# Patient Record
Sex: Female | Born: 1985 | Race: White | Hispanic: No | Marital: Married | Smoking: Former smoker
Health system: Southern US, Community
[De-identification: ages and names within clinical notes are randomized; demographics above are authoritative.]

## PROBLEM LIST (undated history)

## (undated) ENCOUNTER — Inpatient Hospital Stay (HOSPITAL_COMMUNITY): Payer: 59

## (undated) ENCOUNTER — Inpatient Hospital Stay (HOSPITAL_COMMUNITY): Payer: Self-pay

## (undated) DIAGNOSIS — F329 Major depressive disorder, single episode, unspecified: Secondary | ICD-10-CM

## (undated) DIAGNOSIS — M069 Rheumatoid arthritis, unspecified: Secondary | ICD-10-CM

## (undated) DIAGNOSIS — B977 Papillomavirus as the cause of diseases classified elsewhere: Secondary | ICD-10-CM

## (undated) DIAGNOSIS — Z3490 Encounter for supervision of normal pregnancy, unspecified, unspecified trimester: Secondary | ICD-10-CM

## (undated) DIAGNOSIS — F32A Depression, unspecified: Secondary | ICD-10-CM

## (undated) DIAGNOSIS — F419 Anxiety disorder, unspecified: Secondary | ICD-10-CM

## (undated) DIAGNOSIS — R87619 Unspecified abnormal cytological findings in specimens from cervix uteri: Secondary | ICD-10-CM

## (undated) DIAGNOSIS — Z01419 Encounter for gynecological examination (general) (routine) without abnormal findings: Secondary | ICD-10-CM

## (undated) DIAGNOSIS — Q71819 Congenital shortening of unspecified upper limb: Secondary | ICD-10-CM

## (undated) DIAGNOSIS — K219 Gastro-esophageal reflux disease without esophagitis: Secondary | ICD-10-CM

## (undated) DIAGNOSIS — Q320 Congenital tracheomalacia: Secondary | ICD-10-CM

## (undated) HISTORY — PX: BACK SURGERY: SHX140

## (undated) HISTORY — DX: Encounter for gynecological examination (general) (routine) without abnormal findings: Z01.419

## (undated) HISTORY — DX: Papillomavirus as the cause of diseases classified elsewhere: B97.7

## (undated) HISTORY — DX: Encounter for supervision of normal pregnancy, unspecified, unspecified trimester: Z34.90

## (undated) HISTORY — DX: Congenital tracheomalacia: Q32.0

## (undated) HISTORY — DX: Anxiety disorder, unspecified: F41.9

## (undated) HISTORY — PX: SHOULDER ARTHROSCOPY: SHX128

## (undated) HISTORY — DX: Major depressive disorder, single episode, unspecified: F32.9

## (undated) HISTORY — DX: Gastro-esophageal reflux disease without esophagitis: K21.9

## (undated) HISTORY — DX: Unspecified abnormal cytological findings in specimens from cervix uteri: R87.619

## (undated) HISTORY — DX: Congenital shortening of unspecified upper limb: Q71.819

## (undated) HISTORY — DX: Depression, unspecified: F32.A

---
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
## 2005-01-25 ENCOUNTER — Ambulatory Visit (HOSPITAL_COMMUNITY): Payer: Self-pay | Admitting: Psychiatry

## 2005-02-25 ENCOUNTER — Emergency Department (HOSPITAL_COMMUNITY): Admission: EM | Admit: 2005-02-25 | Discharge: 2005-02-25 | Payer: Self-pay | Admitting: Emergency Medicine

## 2005-03-28 ENCOUNTER — Ambulatory Visit (HOSPITAL_COMMUNITY): Payer: Self-pay | Admitting: Psychiatry

## 2005-03-28 ENCOUNTER — Ambulatory Visit: Payer: Self-pay | Admitting: Psychiatry

## 2005-07-13 ENCOUNTER — Ambulatory Visit (HOSPITAL_COMMUNITY): Payer: Self-pay | Admitting: Psychiatry

## 2005-09-01 ENCOUNTER — Other Ambulatory Visit: Admission: RE | Admit: 2005-09-01 | Discharge: 2005-09-01 | Payer: Self-pay | Admitting: Family Medicine

## 2005-10-11 ENCOUNTER — Ambulatory Visit (HOSPITAL_BASED_OUTPATIENT_CLINIC_OR_DEPARTMENT_OTHER): Admission: RE | Admit: 2005-10-11 | Discharge: 2005-10-11 | Payer: Self-pay | Admitting: Orthopaedic Surgery

## 2005-10-12 ENCOUNTER — Ambulatory Visit (HOSPITAL_COMMUNITY): Payer: Self-pay | Admitting: Psychiatry

## 2006-04-25 HISTORY — PX: LUMBAR FUSION: SHX111

## 2006-08-23 ENCOUNTER — Encounter: Admission: RE | Admit: 2006-08-23 | Discharge: 2006-08-23 | Payer: Self-pay | Admitting: Neurosurgery

## 2006-09-07 ENCOUNTER — Inpatient Hospital Stay (HOSPITAL_COMMUNITY): Admission: RE | Admit: 2006-09-07 | Discharge: 2006-09-11 | Payer: Self-pay | Admitting: Neurosurgery

## 2006-10-25 ENCOUNTER — Other Ambulatory Visit: Admission: RE | Admit: 2006-10-25 | Discharge: 2006-10-25 | Payer: Self-pay | Admitting: Family Medicine

## 2006-11-29 ENCOUNTER — Ambulatory Visit (HOSPITAL_COMMUNITY): Payer: Self-pay | Admitting: Psychiatry

## 2007-01-03 ENCOUNTER — Ambulatory Visit (HOSPITAL_COMMUNITY): Payer: Self-pay | Admitting: Psychiatry

## 2007-04-04 ENCOUNTER — Ambulatory Visit (HOSPITAL_COMMUNITY): Payer: Self-pay | Admitting: Psychiatry

## 2007-07-16 ENCOUNTER — Ambulatory Visit (HOSPITAL_COMMUNITY): Payer: Self-pay | Admitting: Psychiatry

## 2007-11-21 ENCOUNTER — Other Ambulatory Visit: Admission: RE | Admit: 2007-11-21 | Discharge: 2007-11-21 | Payer: Self-pay | Admitting: Family Medicine

## 2008-03-01 ENCOUNTER — Emergency Department (HOSPITAL_BASED_OUTPATIENT_CLINIC_OR_DEPARTMENT_OTHER): Admission: EM | Admit: 2008-03-01 | Discharge: 2008-03-01 | Payer: Self-pay | Admitting: Emergency Medicine

## 2008-04-16 ENCOUNTER — Ambulatory Visit: Payer: Self-pay | Admitting: Diagnostic Radiology

## 2008-04-16 ENCOUNTER — Emergency Department (HOSPITAL_BASED_OUTPATIENT_CLINIC_OR_DEPARTMENT_OTHER): Admission: EM | Admit: 2008-04-16 | Discharge: 2008-04-16 | Payer: Self-pay | Admitting: Emergency Medicine

## 2008-07-01 ENCOUNTER — Ambulatory Visit (HOSPITAL_COMMUNITY): Admission: RE | Admit: 2008-07-01 | Discharge: 2008-07-01 | Payer: Self-pay | Admitting: Family Medicine

## 2008-11-26 ENCOUNTER — Other Ambulatory Visit: Admission: RE | Admit: 2008-11-26 | Discharge: 2008-11-26 | Payer: Self-pay | Admitting: Family Medicine

## 2010-01-26 ENCOUNTER — Encounter: Admission: RE | Admit: 2010-01-26 | Discharge: 2010-01-26 | Payer: Self-pay | Admitting: Obstetrics and Gynecology

## 2010-06-22 ENCOUNTER — Ambulatory Visit (HOSPITAL_COMMUNITY): Payer: Commercial Managed Care - PPO | Admitting: Physician Assistant

## 2010-06-22 DIAGNOSIS — F411 Generalized anxiety disorder: Secondary | ICD-10-CM

## 2010-07-06 ENCOUNTER — Ambulatory Visit (HOSPITAL_COMMUNITY): Payer: Commercial Managed Care - PPO | Admitting: Marriage and Family Therapist

## 2010-07-12 ENCOUNTER — Ambulatory Visit (HOSPITAL_COMMUNITY): Payer: Commercial Managed Care - PPO | Admitting: Marriage and Family Therapist

## 2010-07-12 DIAGNOSIS — F411 Generalized anxiety disorder: Secondary | ICD-10-CM

## 2010-07-19 ENCOUNTER — Encounter (HOSPITAL_COMMUNITY): Payer: Commercial Managed Care - PPO | Admitting: Marriage and Family Therapist

## 2010-07-19 DIAGNOSIS — F988 Other specified behavioral and emotional disorders with onset usually occurring in childhood and adolescence: Secondary | ICD-10-CM

## 2010-07-19 DIAGNOSIS — F411 Generalized anxiety disorder: Secondary | ICD-10-CM

## 2010-07-27 ENCOUNTER — Encounter (HOSPITAL_COMMUNITY): Payer: Commercial Managed Care - PPO | Admitting: Physician Assistant

## 2010-07-27 DIAGNOSIS — F988 Other specified behavioral and emotional disorders with onset usually occurring in childhood and adolescence: Secondary | ICD-10-CM

## 2010-07-27 DIAGNOSIS — F411 Generalized anxiety disorder: Secondary | ICD-10-CM

## 2010-07-28 ENCOUNTER — Encounter (HOSPITAL_BASED_OUTPATIENT_CLINIC_OR_DEPARTMENT_OTHER): Payer: Commercial Managed Care - PPO | Admitting: Marriage and Family Therapist

## 2010-07-28 DIAGNOSIS — F988 Other specified behavioral and emotional disorders with onset usually occurring in childhood and adolescence: Secondary | ICD-10-CM

## 2010-07-28 DIAGNOSIS — F411 Generalized anxiety disorder: Secondary | ICD-10-CM

## 2010-08-03 ENCOUNTER — Encounter (HOSPITAL_COMMUNITY): Payer: Commercial Managed Care - PPO | Admitting: Marriage and Family Therapist

## 2010-08-03 DIAGNOSIS — F411 Generalized anxiety disorder: Secondary | ICD-10-CM

## 2010-08-03 DIAGNOSIS — F988 Other specified behavioral and emotional disorders with onset usually occurring in childhood and adolescence: Secondary | ICD-10-CM

## 2010-08-09 ENCOUNTER — Encounter (HOSPITAL_COMMUNITY): Payer: Commercial Managed Care - PPO | Admitting: Marriage and Family Therapist

## 2010-08-09 DIAGNOSIS — F988 Other specified behavioral and emotional disorders with onset usually occurring in childhood and adolescence: Secondary | ICD-10-CM

## 2010-08-09 DIAGNOSIS — F411 Generalized anxiety disorder: Secondary | ICD-10-CM

## 2010-08-16 ENCOUNTER — Encounter (HOSPITAL_COMMUNITY): Payer: Commercial Managed Care - PPO | Admitting: Marriage and Family Therapist

## 2010-08-16 DIAGNOSIS — F988 Other specified behavioral and emotional disorders with onset usually occurring in childhood and adolescence: Secondary | ICD-10-CM

## 2010-08-16 DIAGNOSIS — F411 Generalized anxiety disorder: Secondary | ICD-10-CM

## 2010-08-26 ENCOUNTER — Encounter (HOSPITAL_COMMUNITY): Payer: Commercial Managed Care - PPO | Admitting: Marriage and Family Therapist

## 2010-08-26 DIAGNOSIS — F410 Panic disorder [episodic paroxysmal anxiety] without agoraphobia: Secondary | ICD-10-CM

## 2010-08-26 DIAGNOSIS — F988 Other specified behavioral and emotional disorders with onset usually occurring in childhood and adolescence: Secondary | ICD-10-CM

## 2010-09-03 ENCOUNTER — Encounter (HOSPITAL_COMMUNITY): Payer: Commercial Managed Care - PPO | Admitting: Physician Assistant

## 2010-09-04 ENCOUNTER — Inpatient Hospital Stay (INDEPENDENT_AMBULATORY_CARE_PROVIDER_SITE_OTHER)
Admission: RE | Admit: 2010-09-04 | Discharge: 2010-09-04 | Disposition: A | Discharge status: Home / Self Care | Payer: Commercial Managed Care - PPO | Source: Ambulatory Visit | Attending: Emergency Medicine | Admitting: Emergency Medicine

## 2010-09-04 ENCOUNTER — Encounter: Payer: Self-pay | Admitting: Emergency Medicine

## 2010-09-04 DIAGNOSIS — F411 Generalized anxiety disorder: Secondary | ICD-10-CM

## 2010-09-04 DIAGNOSIS — J069 Acute upper respiratory infection, unspecified: Secondary | ICD-10-CM

## 2010-09-04 HISTORY — DX: Generalized anxiety disorder: F41.1

## 2010-09-06 ENCOUNTER — Telehealth (INDEPENDENT_AMBULATORY_CARE_PROVIDER_SITE_OTHER): Payer: Self-pay | Admitting: *Deleted

## 2010-09-10 NOTE — Op Note (Signed)
NAMECALVINA, [name] NO.:  [id number]   MEDICAL RECORD NO.:  [id number]          PATIENT TYPE:  INP   LOCATION:  2899                         FACILITY:  [facility]   PHYSICIAN:  Reinaldo Meeker, M.D. DATE OF BIRTH:  [birth year]   DATE OF PROCEDURE:  09/07/2006  DATE OF DISCHARGE:                               OPERATIVE REPORT   PREOPERATIVE DIAGNOSIS:  Spondylolysis L5 with spondylolisthesis L5-S1.   POSTOPERATIVE DIAGNOSIS:  Spondylolysis L5 with spondylolisthesis L5-S1.   PROCEDURE:  L5-S1 GIL procedure followed by L5-S1 posterior lumbar  interbody fusion with Nuvasive bony spacer and PEEK interbody cage  followed by nonsegmental pedicle screw instrumentation L5-S1, followed  by L5-S1 posterolateral fusion.   SURGEON:  Dr. Gerlene Fee.   ASSISTANT:  Donalee Citrin, M.D.   PROCEDURE IN DETAIL:  After placed in the prone position the patient's  back was prepped and draped in the usual sterile fashion.  Localizing  fluoroscopy was used prior to incision to identify the appropriate  level.  Midline incision was made above the spinous processes of L5-S1.  Using Bovie cutting current incision was carried down to the spinous  processes.  Subperiosteal dissection was then carried out bilaterally on  the spinous processes, lamina, and facet joint and the far lateral  region to identify the transverse processes of L5.  Self retaining  retractor was placed for exposure and x-ray showed approach to the  appropriate level.  The spinous processes of L5-S1 were then removed.  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  Free floating lamina of L5 was then removed  and then the residual free bone above the pars defects were removed as  well.  Residual bone ligamentum flavum removed in a piecemeal fashion  and the L5-S1 nerve roots were both identified with more decompression  performed than was needed for the posterior body  fusion.  At this time  bilateral microdiskectomy was carried out at L5-S1 with thorough  cleaning of the disk space.  The disk space was then distracted up to 10  mm size.  Was then prepared for posterior body fusion by using rotating  cutter and then a box cutter.  A bony spacer was placed on one side and  a PEEK body cage filled with autologous bone and DBX puddy was placed on  the other.  Prior to placing the second spacer, autologous bone DBX  puddy and BMP were placed in the midline.  At this time pedicle screws  fixation was carried out.  Starting on the patient's left side at L5  drill hole was placed followed by passing the probe, tapping with a 5.0  tap and placing a 5.5 x 40-mm screw in this was followed into good  position by fluoroscopy.  At S1 similar procedure was carried out except  this time a 35 mm x 6.5 screw was carried out and 6.0 tap was used.  Similar procedure was then carried on the opposite side once again using  the same size screws had been used on the opposite side with a 5.5  screw  being used to L5 and a 6.5 screw being used at S1.  Fluoroscopy showed  the screws to be in good position.  High-speed drill was used to  decorticate the transverse process of L5, far lateral aspect of the  sacrum.  Prior to placing the rod autologous bone and BMP were placed  for posterolateral fusion bilaterally.  Large amounts irrigation were  then carried out.  Any bleeding controlled bipolar coagulation and  Gelfoam.  The appropriate length rod was then chosen.  It was then  secured on top and then sequential tightening was carried out.  Prior to  locking the screws in L5, compression was carried out bilaterally.  Final fluoroscopy in AP and lateral direction showed the screws, rods  and interbody spacers all be in good position.  A drain was then brought  through a separate stab wound incision left in the epidural space.  The  wound was then closed in multiple layers of Vicryl  in the muscle,  fascia, subcutaneous, and subcuticular tissues and staples were placed  on the skin.  A sterile dressing was then applied and the patient was  extubated and taken to the recovery room in stable condition.           ______________________________  Reinaldo Meeker, M.D.     ROK/MEDQ  D:  09/07/2006  T:  09/08/2006  Job:  [id number]

## 2010-09-10 NOTE — Discharge Summary (Signed)
[name], STOFFEL NO.:  [id number]   MEDICAL RECORD NO.:  [id number]          PATIENT TYPE:  INP   LOCATION:  3010                         FACILITY:  [facility]   PHYSICIAN:  Reinaldo Meeker, M.D. DATE OF BIRTH:  1986/04/25   DATE OF ADMISSION:  09/07/2006  DATE OF DISCHARGE:  09/11/2006                               DISCHARGE SUMMARY   PREOPERATIVE DIAGNOSIS:  Spondylolysis with spondylolisthesis L5-S1.   PRIMARY OPERATIVE PROCEDURE:  L5-S1 decompressive laminectomy with post  thromboembolic infusion and pedicle screw fixation.   FINAL DIAGNOSIS:  L5-S1 decompressive laminectomy with post  thromboembolic infusion and pedicle screw fixation.   HISTORY:  Ms. [name] is a 25 year old female with back and bilateral  lower extremity pain.  Her MRI scan shows spondylolysis with  spondylolisthesis at L5-S1.  She is now admitted for an L5-S1  decompressive diskectomy with pedicle screw fixation.  On May 15th, she  was taken to the operating room where she underwent the operative  procedure and did well.  She was somewhat slow to increase her activity  and had a rather large amount of severe pain that required a lot of  medical manipulation.  This slowed up her recovery, but she was  eventually able to be discharged on May 19 which was her 4th day  postoperatively.  At that time, she was up, ambulating well, and  tolerating a regular diet.  The wound was healing well.   DISCHARGE MEDICATIONS:  Pain medications and muscle relaxants.   DISCHARGE CONDITION:  Markedly improved versus admission.           ______________________________  Reinaldo Meeker, M.D.     ROK/MEDQ  D:  10/05/2006  T:  10/05/2006  Job:  [id number]

## 2010-09-10 NOTE — Op Note (Signed)
[name], [name]            ACCOUNT NO.:  [id number]   MEDICAL RECORD NO.:  [id number]          PATIENT TYPE:  AMB   LOCATION:  NESC                         FACILITY:  [facility]   PHYSICIAN:  Sharolyn Douglas, M.D.        DATE OF BIRTH:  1985/12/18   DATE OF PROCEDURE:  10/11/2005  DATE OF DISCHARGE:                                 OPERATIVE REPORT   PREOPERATIVE DIAGNOSIS:  1.  Isthmic spondylolisthesis L5-S1.  2.  Right L5 radiculitis.   POSTOPERATIVE DIAGNOSIS:  1.  Isthmic spondylolisthesis L5-S1.  2.  Right L5 radiculitis.   PROCEDURE:  1.  Right L5 transforaminal epidural steroid injection.  2.  Fluoroscopic imaging used for needle placement during epidural steroid      injection.   SURGEON:  Sharolyn Douglas, M.D.   ASSISTANT:  None.   ANESTHESIA:  MAC plus local.   COMPLICATIONS:  None.   ESTIMATED BLOOD LOSS:  None.   INDICATIONS:  The patient is a pleasant 25 year old girl with persistent  back and right leg pain related to isthmic spondylolisthesis and L5 nerve  root irritation.  She has failed to respond to other treatments and now  elects to undergo right L5-S1 transforaminal steroid injection in hopes of  improving her symptoms.  The risks and benefits were reviewed.   PROCEDURE:  The patient was identified in the holding area and taken to the  operating room.  She underwent sedation by anesthesia without difficulty and  was turned prone.  All bony prominences were padded.  The back was prepped  and draped in the usual sterile fashion.  Fluoroscopy was brought into the  field and oblique imaging of the L5-S1 level was obtained.  A 22 gauge  Quincke spinal needle was placed in the 6 o'clock position below the L5  pedicle.  1 mL of Omnipaque injected and there was good epidural spread.  We  then injected a solution of 80 mg  Depo-Medrol and 4 mL of 1% preservative free lidocaine.  The spinal needle  was removed.  A Band-Aid was placed.  The patient was transferred  to  recovery in stable condition.  She was neurologically intact post injection.  Instructions were reviewed.  She will follow-up in two weeks.      Sharolyn Douglas, M.D.  Electronically Signed     MC/MEDQ  D:  10/11/2005  T:  10/11/2005  Job:  [id number]

## 2010-09-21 ENCOUNTER — Encounter (HOSPITAL_COMMUNITY): Payer: Commercial Managed Care - PPO | Admitting: Physician Assistant

## 2010-09-21 DIAGNOSIS — F411 Generalized anxiety disorder: Secondary | ICD-10-CM

## 2010-09-27 ENCOUNTER — Encounter (HOSPITAL_COMMUNITY): Payer: Commercial Managed Care - PPO | Admitting: Marriage and Family Therapist

## 2010-09-27 DIAGNOSIS — F988 Other specified behavioral and emotional disorders with onset usually occurring in childhood and adolescence: Secondary | ICD-10-CM

## 2010-09-27 DIAGNOSIS — F411 Generalized anxiety disorder: Secondary | ICD-10-CM

## 2010-10-11 ENCOUNTER — Other Ambulatory Visit: Payer: Self-pay | Admitting: Obstetrics and Gynecology

## 2010-10-11 DIAGNOSIS — N63 Unspecified lump in unspecified breast: Secondary | ICD-10-CM

## 2010-10-18 ENCOUNTER — Encounter (HOSPITAL_COMMUNITY): Payer: Commercial Managed Care - PPO | Admitting: Marriage and Family Therapist

## 2010-10-18 DIAGNOSIS — F988 Other specified behavioral and emotional disorders with onset usually occurring in childhood and adolescence: Secondary | ICD-10-CM

## 2010-10-18 DIAGNOSIS — F411 Generalized anxiety disorder: Secondary | ICD-10-CM

## 2010-10-21 ENCOUNTER — Ambulatory Visit
Admission: RE | Admit: 2010-10-21 | Discharge: 2010-10-21 | Disposition: A | Discharge status: Home / Self Care | Payer: Commercial Managed Care - PPO | Source: Ambulatory Visit | Attending: Obstetrics and Gynecology | Admitting: Obstetrics and Gynecology

## 2010-10-21 DIAGNOSIS — N63 Unspecified lump in unspecified breast: Secondary | ICD-10-CM

## 2010-11-08 ENCOUNTER — Encounter (HOSPITAL_COMMUNITY): Payer: Commercial Managed Care - PPO | Admitting: Marriage and Family Therapist

## 2010-11-08 DIAGNOSIS — F988 Other specified behavioral and emotional disorders with onset usually occurring in childhood and adolescence: Secondary | ICD-10-CM

## 2010-11-08 DIAGNOSIS — F411 Generalized anxiety disorder: Secondary | ICD-10-CM

## 2010-11-15 ENCOUNTER — Encounter: Payer: Self-pay | Admitting: Family Medicine

## 2010-11-15 ENCOUNTER — Ambulatory Visit (INDEPENDENT_AMBULATORY_CARE_PROVIDER_SITE_OTHER): Payer: Commercial Managed Care - PPO | Admitting: Family Medicine

## 2010-11-15 VITALS — BP 100/62 | HR 93 | Temp 98.9°F | Ht 61.0 in | Wt 138.0 lb

## 2010-11-15 DIAGNOSIS — M129 Arthropathy, unspecified: Secondary | ICD-10-CM

## 2010-11-15 DIAGNOSIS — K219 Gastro-esophageal reflux disease without esophagitis: Secondary | ICD-10-CM

## 2010-11-15 DIAGNOSIS — Z0000 Encounter for general adult medical examination without abnormal findings: Secondary | ICD-10-CM

## 2010-11-15 DIAGNOSIS — M1990 Unspecified osteoarthritis, unspecified site: Secondary | ICD-10-CM

## 2010-11-15 NOTE — Progress Notes (Signed)
Subjective:    Patient ID: [name], female    DOB: [birth year], 25 y.o.   MRN: [id number]  HPI 25 yr old female to establish with [place] and for a cpx. She has transferred from the care of Dr. Marinda Elk. She sees Dr. Henderson Cloud for GYN exams. She had full labs drawn last February, and she says they were all normal. She has several problems to discuss. First she has had pain in her joints for about a year, and she had a lab workup in February that was unrevealing. This included a ESR, CRP, ANA, etc. I don't think an HLA B27 was done though. She has a lot of pain in the hands, especially the PIP joints, and also in the knees. The pain is steadily getting worse. Sometimes her joint swell, sometimes not. She takes one Aleve bid . Interestingly, there is arthritis in her mother and father, and her 66 year old brother has psoriatic arthrtitis.  Also she has a long hx of GERD, and she takes an Omeprazole every morning and a Zantac every evening. She still gets breakthrough heartburn during the day and the night, and she wakes up choking at night. During the day food frequently stops halfway down when she swallows. No vomiting. 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    Review of Systems  Constitutional: Negative.   Eyes: Negative.   Respiratory: Negative.   Cardiovascular: Negative.   Gastrointestinal: Negative.   Genitourinary: Negative.   Musculoskeletal: Negative.   Skin: Negative.   Neurological: Negative.   Hematological: Negative.   Psychiatric/Behavioral: Positive for dysphoric mood. Negative for behavioral problems, confusion and sleep disturbance. The patient is nervous/anxious.        Objective:   Physical Exam  Constitutional: She is oriented to person, place, and time. She appears well-developed and well-nourished. No distress.  HENT:  Head: Normocephalic and atraumatic.  Right Ear: External ear normal.  Left Ear: External ear normal.  Nose: Nose normal.  Mouth/Throat:  Oropharynx is clear and moist. No oropharyngeal exudate.  Eyes: Conjunctivae and EOM are normal. Pupils are equal, round, and reactive to light. No scleral icterus.  Neck: Normal range of motion. Neck supple. No JVD present. No thyromegaly present.  Cardiovascular: Normal rate, regular rhythm, normal heart sounds and intact distal pulses.  Exam reveals no gallop and no friction rub.   No murmur heard. Pulmonary/Chest: Effort normal and breath sounds normal. No respiratory distress. She has no wheezes. She has no rales. She exhibits no tenderness.  Abdominal: Soft. Bowel sounds are normal. She exhibits no distension and no mass. There is no tenderness. There is no rebound and no guarding.  Musculoskeletal: Normal range of motion. She exhibits no edema and no tenderness.  Lymphadenopathy:    She has no cervical adenopathy.  Neurological: She is alert and oriented to person, place, and time. She has normal reflexes. No cranial nerve deficit. She exhibits normal muscle tone. Coordination normal.  Skin: Skin is warm and dry. No rash noted. No erythema.  Psychiatric: She has a normal mood and affect. Her behavior is normal. Judgment and thought content normal.          Assessment & Plan:  She has an arthritis process of some sort going on, and psoriatic arthritis is a possibility. She does not have nail pitting or any skin rashes however. I suggested she try taking 2 Aleve tablets bid, and we will refer to Rheumatology for workup. She has uncontrolled  GERD and she may have some esophageal obstructions. Increase the Omeprazole to bid, and we will refer to GI. She needs upper endoscopy and may require dilation. We will get her old records including recent lab work.

## 2010-11-17 ENCOUNTER — Encounter: Payer: Self-pay | Admitting: Gastroenterology

## 2010-11-17 ENCOUNTER — Ambulatory Visit (INDEPENDENT_AMBULATORY_CARE_PROVIDER_SITE_OTHER): Payer: Commercial Managed Care - PPO | Admitting: Gastroenterology

## 2010-11-17 VITALS — BP 112/60 | HR 92 | Ht 61.0 in | Wt 139.0 lb

## 2010-11-17 DIAGNOSIS — F341 Dysthymic disorder: Secondary | ICD-10-CM

## 2010-11-17 DIAGNOSIS — F419 Anxiety disorder, unspecified: Secondary | ICD-10-CM

## 2010-11-17 DIAGNOSIS — F32A Depression, unspecified: Secondary | ICD-10-CM

## 2010-11-17 DIAGNOSIS — K5901 Slow transit constipation: Secondary | ICD-10-CM

## 2010-11-17 DIAGNOSIS — R1310 Dysphagia, unspecified: Secondary | ICD-10-CM | POA: Insufficient documentation

## 2010-11-17 DIAGNOSIS — R1319 Other dysphagia: Secondary | ICD-10-CM

## 2010-11-17 DIAGNOSIS — F329 Major depressive disorder, single episode, unspecified: Secondary | ICD-10-CM

## 2010-11-17 DIAGNOSIS — M47817 Spondylosis without myelopathy or radiculopathy, lumbosacral region: Secondary | ICD-10-CM

## 2010-11-17 DIAGNOSIS — K219 Gastro-esophageal reflux disease without esophagitis: Secondary | ICD-10-CM

## 2010-11-17 DIAGNOSIS — R1314 Dysphagia, pharyngoesophageal phase: Secondary | ICD-10-CM

## 2010-11-17 HISTORY — DX: Slow transit constipation: K59.01

## 2010-11-17 HISTORY — DX: Gastro-esophageal reflux disease without esophagitis: K21.9

## 2010-11-17 HISTORY — DX: Depression, unspecified: F32.A

## 2010-11-17 HISTORY — DX: Other dysphagia: R13.19

## 2010-11-17 HISTORY — DX: Spondylosis without myelopathy or radiculopathy, lumbosacral region: M47.817

## 2010-11-17 MED ORDER — DEXLANSOPRAZOLE 60 MG PO CPDR: 60.0000 mg | DELAYED_RELEASE_CAPSULE | Freq: Every day | ORAL | Status: AC

## 2010-11-17 NOTE — Progress Notes (Signed)
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 She is a patient of Dr. Gershon Crane, and is referred for refractory acid reflux symptoms manifested by regurgitation and burning substernal chest pain both daytime and nighttime. She also has noted some intermittent solid food dysphagia in her upper esophagus but no painful swallowing. These problems have been progressive over the last 3 years, she has not had previous barium studies or endoscopic exams. She's had a variety of problems in childhood including nausea vomiting, but has not had previous abdominal surgery. Her medical care as been complicated by  chronic anxiety and depression which is well managed on various psychotropic medications. She also has lumbar spondylosis and has had what sounds like a laminectomy. Currently she is on omeprazole 20 mg twice a day and when necessary Zantac, birth control pills, Paxil, Wellbutrin, and BuSpar. He denies any gynecologic abnormalities or missed menstrual periods. Her father does have acid reflux problems. She does use when necessary Aleve for back pain but denies other NSAID use. No history of alcohol or cigarette abuse noted.  I have reviewed this patient's present history, medical and surgical past history, allergies and medication.   ROS: The remainder of the 10 point ROS is negative.... her anxiety syndrome appears to be under good control. She follows a regular diet denies [place] specific food intolerances. Family history is noncontributory. There is no history of Raynaud's phenomenon. She does have some structural bone deformities of her hands and is followed by Dr. Teressa Senter. The patient also relates mild constipation but no rectal bleeding or melena. She denies any hepatobiliary complaints or history of gallbladder disease, liver disease, and apparently has been vaccinated for hepatitis.   Physical Exam: General well developed well nourished  patient in no acute distress, appearing her stated age Eyes PERRLA, no icterus, fundoscopic exam per opthamologist Skin no lesions noted Neck supple, no adenopathy, no thyroid enlargement, no tenderness Chest clear to percussion and auscultation Heart no significant murmurs, gallops or rubs noted Abdomen no hepatosplenomegaly masses or tenderness, BS normal.  Extremities no acute joint lesions, edema, phlebitis or evidence of cellulitis. Neurologic patient oriented x 3, cranial nerves intact, no focal neurologic deficits noted. Psychological mental status normal and normal affect.  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 pylori infection, or structural gastric outlet stenosis. I have scheduled her for endoscopy with possible sedation, change her medication to Dexilant 60 mg before breakfast, standard antireflux maneuvers including elevation of the head of her bed 6 inches, antireflux diet. She is to continue her psychotropic medications for anxiety and depression. As mentioned above she is on birth control pills. The patient relates her recent labs include a CBC a metabolic profile of all been normal. She does have mild constipation for which prescribed when necessary MiraLax at bedtime.  Please copy her primary care physician, referring physician, and pertinent subspecialists.  Encounter Diagnosis  Name Primary?  . Esophageal reflux Yes

## 2010-11-17 NOTE — Patient Instructions (Signed)
Your procedure has been scheduled for 11/22/2010, please follow the seperate instructions.  Today you watched the Genella Rife movie in the office.  Take the Dexilant samples once a day until your procedure. You can buy Miralax OTC and take one capful in 8 ounces of water as needed for constipation.   Acid Reflux (GERD) Acid reflux is also called gastroesophageal reflux disease (GERD). Your stomach makes acid to help digest food. Acid reflux happens when acid from your stomach goes into the tube between your mouth and stomach (esophagus). Your stomach is protected from the acid, but this tube is not. When acid gets into the tube, it may cause a burning feeling in the chest (heartburn). Besides heartburn, other health problems can happen if the acid keeps going into the tube. Some causes of acid reflux include:  Being overweight.   Smoking.   Drinking alcohol.   Eating large meals.   Eating meals and then going to bed right away.   Eating certain foods.   Increased stomach acid production.  HOME CARE  Take all medicine as told by your doctor.   You may need to:   Lose weight.   Avoid alcohol.   Quit smoking.   Do not eat big meals. It is better to eat smaller meals throughout the day.   Do not eat a meal and then nap or go to bed.   Sleep with your head higher than your stomach.   Avoid foods that bother you.   You may need more tests, or you may need to see a special doctor.  GET HELP RIGHT AWAY IF:  You have chest pain that is different than before.   You have pain that goes to your arms, jaw, or between your shoulder blades.   You throw up (vomit) blood, dark brown liquid, or your throw up looks like coffee grounds.   You have trouble swallowing.   You have trouble breathing or cannot stop coughing.   You feel dizzy or pass out.   Your skin is cool, wet, and pale.   Your medicine is not helping.  MAKE SURE YOU:   Understand these instructions.   Will watch your  condition.   Will get help right away if you are not doing well or get worse.  Document Released: 09/28/2007 Document Re-Released: 07/06/2009 [facility] Patient Information 2011 [place], [place].

## 2010-11-22 ENCOUNTER — Encounter: Payer: Self-pay | Admitting: Gastroenterology

## 2010-11-22 ENCOUNTER — Ambulatory Visit (AMBULATORY_SURGERY_CENTER): Payer: Commercial Managed Care - PPO | Admitting: Gastroenterology

## 2010-11-22 DIAGNOSIS — R1310 Dysphagia, unspecified: Secondary | ICD-10-CM

## 2010-11-22 DIAGNOSIS — R1319 Other dysphagia: Secondary | ICD-10-CM

## 2010-11-22 DIAGNOSIS — R1314 Dysphagia, pharyngoesophageal phase: Secondary | ICD-10-CM

## 2010-11-22 DIAGNOSIS — K219 Gastro-esophageal reflux disease without esophagitis: Secondary | ICD-10-CM

## 2010-11-22 DIAGNOSIS — R079 Chest pain, unspecified: Secondary | ICD-10-CM

## 2010-11-22 MED ORDER — SODIUM CHLORIDE 0.9 % IV SOLN: 500.0000 mL | INTRAVENOUS | Status: DC

## 2010-11-22 NOTE — Patient Instructions (Signed)
Green and [employer] reviewed with patient and care partner.  Blue discharge instructions signed by care partner.  Impressions/ Recommendations:  Gastritis (handout given)  Continue medications as you were taking them prior to your procedure.

## 2010-11-23 ENCOUNTER — Encounter (HOSPITAL_COMMUNITY): Payer: Commercial Managed Care - PPO | Admitting: Physician Assistant

## 2010-11-23 ENCOUNTER — Telehealth: Payer: Self-pay

## 2010-11-23 DIAGNOSIS — F411 Generalized anxiety disorder: Secondary | ICD-10-CM

## 2010-11-23 DIAGNOSIS — F988 Other specified behavioral and emotional disorders with onset usually occurring in childhood and adolescence: Secondary | ICD-10-CM

## 2010-11-23 NOTE — Telephone Encounter (Signed)
Left message on answering machine. 

## 2010-11-25 ENCOUNTER — Encounter (HOSPITAL_COMMUNITY): Payer: Commercial Managed Care - PPO | Admitting: Marriage and Family Therapist

## 2010-11-25 DIAGNOSIS — K219 Gastro-esophageal reflux disease without esophagitis: Secondary | ICD-10-CM

## 2010-11-25 DIAGNOSIS — F988 Other specified behavioral and emotional disorders with onset usually occurring in childhood and adolescence: Secondary | ICD-10-CM

## 2010-11-25 DIAGNOSIS — F411 Generalized anxiety disorder: Secondary | ICD-10-CM

## 2010-11-25 LAB — HELICOBACTER PYLORI SCREEN-BIOPSY: UREASE: NEGATIVE

## 2010-11-26 ENCOUNTER — Encounter: Payer: Self-pay | Admitting: Gastroenterology

## 2010-12-01 ENCOUNTER — Ambulatory Visit (INDEPENDENT_AMBULATORY_CARE_PROVIDER_SITE_OTHER): Payer: Commercial Managed Care - PPO | Admitting: Family Medicine

## 2010-12-01 ENCOUNTER — Encounter: Payer: Self-pay | Admitting: Family Medicine

## 2010-12-01 VITALS — BP 110/70 | HR 80 | Temp 98.6°F | Wt 141.0 lb

## 2010-12-01 DIAGNOSIS — M25519 Pain in unspecified shoulder: Secondary | ICD-10-CM

## 2010-12-01 MED ORDER — HYDROCODONE-ACETAMINOPHEN 10-660 MG PO TABS: 1.0000 | ORAL_TABLET | Freq: Four times a day (QID) | ORAL | Status: DC | PRN

## 2010-12-01 NOTE — Progress Notes (Signed)
  Subjective:    Patient ID: [name], female    DOB: August 17, 1985, 25 y.o.   MRN: [id number]  HPI Here for the sudden onset of sharp severe pain in the left shoulder which started 3 mornings ago when she woke up. No recent trauma. The shoulder is stiff and painful, and she cannot raise the arm above her head due to pain. No neck or back pain. We saw her a few weeks ago for generalized joint pains and actually set her up to see Dr. Azzie Roup for a Rheumatology consult. This is set for 12-07-10. She has been using ice, Aleve, and Robaxin with no relief.    Review of Systems  Constitutional: Negative.   Musculoskeletal: Positive for arthralgias.       Objective:   Physical Exam  Constitutional: She appears well-developed and well-nourished.  Musculoskeletal:       She splints  the left arm up against her chest. The left shoulder is tender anteriorly, and she has very reduced ROM. She cannot extend the arm above horizontal.           Assessment & Plan:  Shoulder pain, of uncertain etiology. Use Vicodin for pain. She will se Dr. Dareen Piano next week as above. Use a sling to support the arm.

## 2010-12-02 ENCOUNTER — Emergency Department (INDEPENDENT_AMBULATORY_CARE_PROVIDER_SITE_OTHER): Payer: 59

## 2010-12-02 ENCOUNTER — Other Ambulatory Visit (INDEPENDENT_AMBULATORY_CARE_PROVIDER_SITE_OTHER): Payer: Self-pay

## 2010-12-02 ENCOUNTER — Telehealth: Payer: Self-pay | Admitting: *Deleted

## 2010-12-02 ENCOUNTER — Encounter (HOSPITAL_BASED_OUTPATIENT_CLINIC_OR_DEPARTMENT_OTHER): Payer: Self-pay | Admitting: *Deleted

## 2010-12-02 ENCOUNTER — Emergency Department (HOSPITAL_BASED_OUTPATIENT_CLINIC_OR_DEPARTMENT_OTHER)
Admission: EM | Admit: 2010-12-02 | Discharge: 2010-12-02 | Disposition: A | Discharge status: Home / Self Care | Payer: 59 | Attending: Emergency Medicine | Admitting: Emergency Medicine

## 2010-12-02 ENCOUNTER — Other Ambulatory Visit: Payer: Self-pay | Admitting: Gastroenterology

## 2010-12-02 DIAGNOSIS — M25519 Pain in unspecified shoulder: Secondary | ICD-10-CM

## 2010-12-02 DIAGNOSIS — F329 Major depressive disorder, single episode, unspecified: Secondary | ICD-10-CM | POA: Insufficient documentation

## 2010-12-02 DIAGNOSIS — K219 Gastro-esophageal reflux disease without esophagitis: Secondary | ICD-10-CM | POA: Insufficient documentation

## 2010-12-02 DIAGNOSIS — F3289 Other specified depressive episodes: Secondary | ICD-10-CM | POA: Insufficient documentation

## 2010-12-02 MED ORDER — PREDNISONE 20 MG PO TABS: 40.0000 mg | ORAL_TABLET | Freq: Every day | ORAL | Status: AC

## 2010-12-02 MED ORDER — HYDROMORPHONE HCL 1 MG/ML IJ SOLN
1.0000 mg | Freq: Once | INTRAMUSCULAR | Status: AC
  Administered 2010-12-02: 1 mg via INTRAMUSCULAR
  Filled 2010-12-02: qty 1

## 2010-12-02 MED ORDER — PREDNISONE 20 MG PO TABS
50.0000 mg | ORAL_TABLET | Freq: Once | ORAL | Status: DC
  Filled 2010-12-02: qty 2

## 2010-12-02 MED ORDER — OXYCODONE-ACETAMINOPHEN 5-325 MG PO TABS: 2.0000 | ORAL_TABLET | ORAL | Status: AC | PRN

## 2010-12-02 NOTE — ED Notes (Signed)
Pt sts she awoke on last Friday morning with left shoulder pain that has gotten progressively worse.

## 2010-12-02 NOTE — Telephone Encounter (Signed)
Shoulder is hurting worse today, and went to the ER last night.  Please advise what to do next.Marland Kitchen

## 2010-12-02 NOTE — Telephone Encounter (Signed)
Spoke with pt and gave message

## 2010-12-02 NOTE — ED Provider Notes (Signed)
History     CSN: [id number] Arrival date & time: 12/02/2010  1:48 AM  Chief Complaint  Patient presents with  . Shoulder Pain   Patient is a 25 y.o. female presenting with shoulder pain. The history is provided by the patient.  Shoulder Pain The current episode started more than 2 days ago. The problem occurs constantly. Pertinent negatives include no chest pain, no abdominal pain, no headaches and no shortness of breath. The symptoms are relieved by nothing.  patient woke with left shoulder pain on Friday. No trauma. No fevers. No numbness or weakness. She has previously had pain in other various joints. She saw her PCP today. He was already having her follow up with a rhumatologist. No relief with vicodin. No recent infections.   Past Medical History  Diagnosis Date  . Depression     sees Jorje Guild PA at [facility]  . Chronic headaches   . GERD (gastroesophageal reflux disease)   . HPV (human papilloma virus) infection   . Anxiety   . Abnormal Pap smear of cervix   . Gynecological examination     sees Dr. Henderson Cloud  . Tracheomalacia, congenital   . Brachydactyly of fingers     Past Surgical History  Procedure Date  . Hand surgery 1996    to elongate the right 3rd metacarpal   . Lumbar fusion 2008    per Dr. Gerlene Fee on L4-5 for spondylolisthesis     Family History  Problem Relation Age of Onset  . Arthritis Mother   . Depression Mother   . Hyperlipidemia Father   . Heart disease Father   . Hypertension Father   . Arthritis Father   . Arthritis Brother   . Psoriasis Brother   . Uterine cancer Maternal Aunt     x 2  . Colon cancer Neg Hx     History  Substance Use Topics  . Smoking status: Former Games developer  . Smokeless tobacco: Never Used  . Alcohol Use: 1.0 oz/week    2 drink(s) per week    OB History    Grav Para Term Preterm Abortions TAB SAB Ect Mult Living                  Review of Systems  Constitutional: Negative for activity change and  appetite change.  HENT: Negative for neck stiffness.   Eyes: Negative for pain.  Respiratory: Negative for chest tightness and shortness of breath.   Cardiovascular: Negative for chest pain and leg swelling.  Gastrointestinal: Negative for nausea, vomiting, abdominal pain and diarrhea.  Genitourinary: Negative for flank pain.  Musculoskeletal: Negative for myalgias, back pain and joint swelling.  Skin: Negative for rash.  Neurological: Negative for weakness, numbness and headaches.  Psychiatric/Behavioral: Negative for behavioral problems.    Physical Exam  BP 145/100  Pulse 91  Temp(Src) 99.4 F (37.4 C) (Oral)  Resp 18  Ht 5\' 1"  (1.549 m)  Wt 138 lb (62.596 kg)  BMI 26.07 kg/m2  SpO2 99%  LMP 11/07/2010  Physical Exam  Nursing note and vitals reviewed. Constitutional: She is oriented to person, place, and time. She appears well-developed and well-nourished.  HENT:  Head: Normocephalic and atraumatic.  Eyes: EOM are normal. Pupils are equal, round, and reactive to light.  Neck: Normal range of motion. Neck supple.  Cardiovascular: Normal rate, regular rhythm and normal heart sounds.   No murmur heard. Pulmonary/Chest: Effort normal and breath sounds normal. No respiratory distress. She has no wheezes. She  has no rales.  Musculoskeletal: She exhibits tenderness. She exhibits no edema.       Tenderness over left anterior shoulder. Decreased ROM to active and passive movement. NV intact distally. No pain until range hits about 15 degrees in any direction.   Neurological: She is alert and oriented to person, place, and time. No cranial nerve deficit.  Skin: Skin is warm and dry.  Psychiatric: Her speech is normal.    ED Course  Procedures Dg Shoulder Left  12/02/2010  *RADIOLOGY REPORT*  Clinical Data: Left shoulder pain.  LEFT SHOULDER - 2+ VIEW  Comparison: None  Findings: The joint spaces are maintained.  No acute bony findings or abnormal soft tissue calcifications.  The  left lung is clear.  IMPRESSION: No acute bony findings.  Original Report Authenticated By: P. Loralie Champagne, M.D.     MDM Sh[place] pain. Worse with movement. Saw PCP for same. Doubt infection. Has Rheum follow up. Steroids given. Pain controll.       Juliet Rude. Rubin Payor, MD 12/02/10 [phone]

## 2010-12-02 NOTE — Telephone Encounter (Signed)
Tell her I did a referral to Orthopedics, and they can probably see her today

## 2010-12-11 ENCOUNTER — Inpatient Hospital Stay (INDEPENDENT_AMBULATORY_CARE_PROVIDER_SITE_OTHER)
Admission: RE | Admit: 2010-12-11 | Discharge: 2010-12-11 | Disposition: A | Discharge status: Home / Self Care | Payer: 59 | Source: Ambulatory Visit | Attending: Emergency Medicine | Admitting: Emergency Medicine

## 2010-12-11 ENCOUNTER — Encounter: Payer: Self-pay | Admitting: Emergency Medicine

## 2010-12-11 DIAGNOSIS — R112 Nausea with vomiting, unspecified: Secondary | ICD-10-CM

## 2010-12-11 DIAGNOSIS — R109 Unspecified abdominal pain: Secondary | ICD-10-CM | POA: Insufficient documentation

## 2010-12-11 HISTORY — DX: Unspecified abdominal pain: R10.9

## 2010-12-11 HISTORY — DX: Nausea with vomiting, unspecified: R11.2

## 2010-12-14 ENCOUNTER — Encounter (HOSPITAL_COMMUNITY): Payer: Commercial Managed Care - PPO | Admitting: Marriage and Family Therapist

## 2011-01-28 LAB — RAPID STREP SCREEN (MED CTR MEBANE ONLY): Streptococcus, Group A Screen (Direct): NEGATIVE

## 2011-02-02 ENCOUNTER — Other Ambulatory Visit: Payer: Self-pay | Admitting: Family Medicine

## 2011-02-15 ENCOUNTER — Encounter (HOSPITAL_COMMUNITY): Payer: 59 | Admitting: Marriage and Family Therapist

## 2011-02-15 DIAGNOSIS — F988 Other specified behavioral and emotional disorders with onset usually occurring in childhood and adolescence: Secondary | ICD-10-CM

## 2011-02-15 DIAGNOSIS — F411 Generalized anxiety disorder: Secondary | ICD-10-CM

## 2011-02-22 ENCOUNTER — Encounter (HOSPITAL_COMMUNITY): Payer: 59 | Admitting: Physician Assistant

## 2011-02-22 DIAGNOSIS — F988 Other specified behavioral and emotional disorders with onset usually occurring in childhood and adolescence: Secondary | ICD-10-CM

## 2011-02-22 DIAGNOSIS — F411 Generalized anxiety disorder: Secondary | ICD-10-CM

## 2011-03-02 ENCOUNTER — Ambulatory Visit (HOSPITAL_COMMUNITY): Payer: 59 | Admitting: Marriage and Family Therapist

## 2011-03-02 DIAGNOSIS — F411 Generalized anxiety disorder: Secondary | ICD-10-CM

## 2011-03-02 DIAGNOSIS — F988 Other specified behavioral and emotional disorders with onset usually occurring in childhood and adolescence: Secondary | ICD-10-CM

## 2011-03-02 NOTE — Progress Notes (Signed)
   THERAPIST PROGRESS NOTE  Session Time: 8:00 a.m. - 9:00 a.m.  Participation Level: Active  Behavioral Response: DisheveledAlertAnxious, Depressed and Irritable  Type of Therapy: Individual Therapy  Treatment Goals addressed: Anxiety and Coping  Interventions: CBT, Strength-based, Supportive and Family Systems  Summary: [name] is a 25 y.o. female who presents with anxiety, depression, and family problems. She reports some improvement in mood based on using the CBT daily thought App journal on her cell phone regularly.  Patient states there were times when the thought journal helped and times when she tried to do it in the moment and had a more difficult time alleviating her depression and anxiety.  She also talked about the difference between when she is affected by her negative thoughts and times when she has difficulty trusting her "gut instincts," particularly concerning trusting her husband who in the past had lied to her and kept looking and pornography a secret.  Talked about the difference between making decisions based on a negative thought and when it is her "gut" talking to her.   Also, patient cried about hearing from Facebook that her brother is getting married.  Her mother told everyone in the family according to patient except her.  Suicidal/Homicidal: Nowithout intent/plan  Therapist Response:  Patient will continue to use the daily thought journal to help with negative thinking.  Suggested using HeartMath biofeedback breathing techniques to help her calm herself enough to alleviate anxiety helping her to think more clearly. Continue to talk about detachment relating to her relationship with her mother.  Plan: Return again in 2 weeks.  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

## 2011-03-13 ENCOUNTER — Other Ambulatory Visit: Payer: Self-pay | Admitting: Family Medicine

## 2011-03-16 ENCOUNTER — Encounter (HOSPITAL_COMMUNITY): Payer: 59 | Admitting: Marriage and Family Therapist

## 2011-03-28 NOTE — Progress Notes (Signed)
Summary: N/V/tm   Vital Signs:  Patient Profile:   25 Years Old Female CC:      nause and vomiting since a.m.; lmp 12-05-10 Height:     61 inches Weight:      135.75 pounds O2 Sat:      99 % O2 treatment:    Room Air Temp:     100.2 degrees F oral Pulse rate:   111 / minute Resp:     16 per minute BP sitting:   114 / 78  Vitals Entered By: Lavell Islam RN (December 11, 2010 3:33 PM)                  Updated Prior Medication List: * BUSPAR HCL 15 MG TABS (BUSPIRONE HCL) TWICE A DAY * WELLBUTRIN 300 MG TABS (BUPROPION HCL)  OMEPRAZOLE-SODIUM BICARBONATE 40-1100 MG CAPS (OMEPRAZOLE-SODIUM BICARBONATE) daily ZANTAC 75 75 MG TABS (RANITIDINE HCL) daily MULTIVITAMINS  CAPS (MULTIPLE VITAMIN) daily * BIRTH CONTROL PILL UNKNOWN NAME daily AMOXICILLIN 875 MG TABS (AMOXICILLIN) 1 by mouth two times a day for 7 days PREDNISONE (PAK) 10 MG TABS (PREDNISONE) 6 day pack, use as directed  Current Allergies (reviewed today): ! * NUTMEGHistory of Present Illness History from: patient Chief Complaint: nause and vomiting since a.m.; lmp 12-05-10 History of Present Illness: N/V since this morning.  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 Feels sick, chills, N/V.  Has tried Phenergan which didn't help but may be expired.  She checked her HCG at work and it was negative.    She has been keeping down some fluids and foods today.  No other URI symptoms.  No diarrhea.  She also has GERD and takes meds for that per her GI specialist.  REVIEW OF SYSTEMS Constitutional Symptoms      Denies fever, chills, night sweats, weight loss, weight gain, and fatigue.  Eyes       Denies change in vision, eye pain, eye discharge, glasses, contact lenses, and eye surgery. Ear/Nose/Throat/Mouth       Denies hearing loss/aids, change in hearing, ear pain, ear discharge, dizziness, frequent runny nose, frequent nose bleeds, sinus problems, sore throat, hoarseness, and tooth pain or bleeding.    Respiratory       Denies dry cough, productive cough, wheezing, shortness of breath, asthma, bronchitis, and emphysema/COPD.  Cardiovascular       Denies murmurs, chest pain, and tires easily with exhertion.    Gastrointestinal       Complains of nausea/vomiting.      Denies stomach pain, diarrhea, constipation, blood in bowel movements, and indigestion. Genitourniary       Denies painful urination, kidney stones, and loss of urinary control. Neurological       Denies paralysis, seizures, and fainting/blackouts. Musculoskeletal       Denies muscle pain, joint pain, joint stiffness, decreased range of motion, redness, swelling, muscle weakness, and gout.  Skin       Denies bruising, unusual mles/lumps or sores, and hair/skin or nail changes.  Psych       Denies mood changes, temper/anger issues, anxiety/stress, speech problems, depression, and sleep problems. Other Comments: nausea/vomiting since a.m.   Past History:  Past Medical History: Reviewed history from 09/04/2010 and no changes required. Anxiety GERD  Past Surgical History: Reviewed history from 09/04/2010 and no changes required. Lumbar fusion hand surgery  Family History: Reviewed history from 09/04/2010 and no changes required. Family History of Anxiety Family History  of CAD Female 1st degree relative <50 Family History Diabetes 1st degree relative Family History High cholesterol Family History Hypertension  Social History: Reviewed history from 09/04/2010 and no changes required. Married Never Smoked Alcohol use-yes Drug use-no Physical Exam General appearance: well developed, well nourished, no acute distress Ears: normal, no lesions or deformities Nasal: mucosa pink, nonedematous, no septal deviation, turbinates normal Oral/Pharynx: tongue normal, posterior pharynx without erythema or exudate Chest/Lungs: no rales, wheezes, or rhonchi bilateral, breath sounds equal without effort Heart: regular rate  and  rhythm, no murmur Abdomen: positive epigastric tenderness, abdomen soft without obvious organomegaly, ND, no guarding, no rebound, no RLQ tenderness, +BS4Q Skin: no obvious rashes or lesions MSE: oriented to time, place, and person Assessment New Problems: ABDOMINAL PAIN (ICD-789.00) NAUSEA AND VOMITING (ICD-787.01)   Patient Education: Patient and/or caregiver instructed in the following: rest, fluids, Tylenol prn.  Plan New Medications/Changes: ZOFRAN ODT 4 MG TBDP (ONDANSETRON) 1 by mouth q6 hrs as needed for nausea  #16 x 0, 12/11/2010, Hoyt Koch MD  New Orders: Zofran 1mg . injection [J2405] Est. Patient Level III [40981] Admin of Therapeutic Inj  intramuscular or subcutaneous [96372] Planning Comments:   LIkely is viral gastroenteritis / stomach flu.  However at her age, ER precautions if pain localizes to RLQ or worsening pain for appendicitis.  Right now her main symptom is Nausea and I don't see any red flags for anything more serious. Will give Zofran ODT and gave IM shot here in clinic.  Encourage bland diet for 5 days (avoid spicy, greasy, fried, acidic) and continue to take your GERD meds as directed. If diarrhea develops and gets worse, consider stool studies including C.dif since she has had contact.  Follow-up with your primary care physician if not improving or if getting worse   The patient and/or caregiver has been counseled thoroughly with regard to medications prescribed including dosage, schedule, interactions, rationale for use, and possible side effects and they verbalize understanding.  Diagnoses and expected course of recovery discussed and will return if not improved as expected or if the condition worsens. Patient and/or caregiver verbalized understanding.  Prescriptions: ZOFRAN ODT 4 MG TBDP (ONDANSETRON) 1 by mouth q6 hrs as needed for nausea  #16 x 0   Entered and Authorized by:   Hoyt Koch MD   Signed by:   Hoyt Koch MD on  12/11/2010   Method used:   Print then Give to Patient   RxID:   [id number]   Medication Administration  Injection # 1:    Medication: Zofran 1mg . injection    Diagnosis: NAUSEA AND VOMITING (ICD-787.01)    Route: IM    Site: LUOQ gluteus    Exp Date: 06/2012    Lot #: [id number]    Mfr: west-ward    Patient tolerated injection without complications    Given by: Lavell Islam RN (December 11, 2010 3:57 PM)  Orders Added: 1)  Zofran 1mg . injection [J2405] 2)  Est. Patient Level III [62952] 3)  Admin of Therapeutic Inj  intramuscular or subcutaneous [84132]

## 2011-03-28 NOTE — Progress Notes (Signed)
Summary: sore throat/TM (room 2)   Vital Signs:  Patient Profile:   25 Years Old Female CC:      Sore throat, fever, cough Height:     61 inches Weight:      138.50 pounds O2 Sat:      98 % O2 treatment:    Room Air Temp:     99.1 degrees F oral Pulse rate:   112 / minute Resp:     16 per minute BP sitting:   104 / 68  (left arm) Cuff size:   regular  Pt. in pain?   yes    Location:   throat    Intensity:   3  Vitals Entered By: Lavell Islam RN (Sep 04, 2010 11:41 AM)                   Current Allergies: ! * NUTMEGHistory of Present Illness History from: patient Chief Complaint: Sore throat, fever, cough History of Present Illness: 25 Years Old Female complains of onset of cold symptoms for a few days.  Alyssamae has been using OTC cold meds which is helping a little bit.  She is a Pensions consultant. + sore throat No cough No pleuritic pain No wheezing + nasal congestion + post-nasal drainage + sinus pain/pressure No chest congestion No itchy/red eyes No earache No hemoptysis No SOB No chills/sweats No fever No nausea No vomiting No abdominal pain No diarrhea No skin rashes No fatigue No myalgias No headache   REVIEW OF SYSTEMS Constitutional Symptoms       Complains of fever.     Denies chills, night sweats, weight loss, weight gain, and fatigue.  Eyes       Denies change in vision, eye pain, eye discharge, glasses, contact lenses, and eye surgery. Ear/Nose/Throat/Mouth       Complains of sore throat and hoarseness.      Denies hearing loss/aids, change in hearing, ear pain, ear discharge, dizziness, frequent runny nose, frequent nose bleeds, sinus problems, and tooth pain or bleeding.  Respiratory       Complains of dry cough and shortness of breath.      Denies productive cough, wheezing, asthma, bronchitis, and emphysema/COPD.  Cardiovascular       Denies murmurs, chest pain, and tires easily with exhertion.    Gastrointestinal       Complains  of nausea/vomiting.      Denies stomach pain, diarrhea, constipation, blood in bowel movements, and indigestion. Genitourniary       Denies painful urination, kidney stones, and loss of urinary control. Neurological       Complains of headaches.      Denies paralysis, seizures, and fainting/blackouts. Musculoskeletal       Complains of muscle pain and joint pain.      Denies joint stiffness, decreased range of motion, redness, swelling, muscle weakness, and gout.  Skin       Denies bruising, unusual mles/lumps or sores, and hair/skin or nail changes.  Psych       Denies mood changes, temper/anger issues, anxiety/stress, speech problems, depression, and sleep problems.  Past History:  Past Medical History: Anxiety GERD  Past Surgical History: Lumbar fusion hand surgery  Family History: Family History of Anxiety Family History of CAD Female 1st degree relative <50 Family History Diabetes 1st degree relative Family History High cholesterol Family History Hypertension  Social History: Married Never Smoked Alcohol use-yes Drug use-no Smoking Status:  never Drug Use:  no  Physical Exam General appearance: well developed, well nourished, no acute distress Ears: normal, no lesions or deformities Nasal: mucosa pink, nonedematous, no septal deviation, turbinates normal Oral/Pharynx: mild erythema, no exudates, no swelling Chest/Lungs: no rales, wheezes, or rhonchi bilateral, breath sounds equal without effort Heart: regular rate and  rhythm, no murmur MSE: oriented to time, place, and person Assessment New Problems: FAMILY HISTORY DIABETES 1ST DEGREE RELATIVE (ICD-V18.0) FAMILY HISTORY OF CAD FEMALE 1ST DEGREE RELATIVE <50 (ICD-V17.3) GERD (ICD-530.81) ANXIETY (ICD-300.00) UPPER RESPIRATORY INFECTION, ACUTE (ICD-465.9)   Plan New Medications/Changes: PREDNISONE (PAK) 10 MG TABS (PREDNISONE) 6 day pack, use as directed  #1 x 0, 09/04/2010, Hoyt Koch MD AMOXICILLIN 875  MG TABS (AMOXICILLIN) 1 by mouth two times a day for 7 days  #14 x 0, 09/04/2010, Hoyt Koch MD  New Orders: New Patient Level III [phone] Pulse Oximetry (single measurment) [94760] Services provided After hours-Weekends-Holidays [99051] Rapid Strep [87880] T-Culture, Throat [[id number]] Planning Comments:   1)  Take the prescribed antibiotic as instructed.  Throat culture pending. 2)  Use nasal saline solution (over the counter) at least 3 times a day. 3)  Use over the counter decongestants like Zyrtec-D every 12 hours as needed to help with congestion. 4)  Can take tylenol every 6 hours or motrin every 8 hours for pain or fever. 5)  Follow up with your primary doctor  if no improvement in 5-7 days, sooner if increasing pain, fever, or new symptoms.    The patient and/or caregiver has been counseled thoroughly with regard to medications prescribed including dosage, schedule, interactions, rationale for use, and possible side effects and they verbalize understanding.  Diagnoses and expected course of recovery discussed and will return if not improved as expected or if the condition worsens. Patient and/or caregiver verbalized understanding.  Prescriptions: PREDNISONE (PAK) 10 MG TABS (PREDNISONE) 6 day pack, use as directed  #1 x 0   Entered and Authorized by:   Hoyt Koch MD   Signed by:   Hoyt Koch MD on 09/04/2010   Method used:   Print then Give to Patient   RxID:   [id number] AMOXICILLIN 875 MG TABS (AMOXICILLIN) 1 by mouth two times a day for 7 days  #14 x 0   Entered and Authorized by:   Hoyt Koch MD   Signed by:   Hoyt Koch MD on 09/04/2010   Method used:   Print then Give to Patient   RxID:   [id number]   Orders Added: 1)  New Patient Level III [40102] 2)  Pulse Oximetry (single measurment) [94760] 3)  Services provided After hours-Weekends-Holidays [99051] 4)  Rapid Strep [72536] 5)  T-Culture, Throat  [[id number]]    Laboratory Results  Date/Time Received: Sep 04, 2010 11:53 AM  Date/Time Reported: Sep 04, 2010 11:53 AM   Other Tests  Rapid Strep: negative  Kit Test Internal QC: Negative   (Normal Range: Negative)

## 2011-03-28 NOTE — Telephone Encounter (Signed)
  Phone Note Outgoing Call Call back at [facility] Phone [phone]   Call placed by: Lajean Saver RN,  Sep 06, 2010 12:24 PM Call placed to: Patient Action Taken: Phone Call Completed Summary of Call: Callback: Patient reports she is feeling a little bit better. Gave her negative throat culture result.

## 2011-04-05 ENCOUNTER — Ambulatory Visit (INDEPENDENT_AMBULATORY_CARE_PROVIDER_SITE_OTHER): Payer: 59 | Admitting: Marriage and Family Therapist

## 2011-04-05 DIAGNOSIS — F411 Generalized anxiety disorder: Secondary | ICD-10-CM

## 2011-04-05 DIAGNOSIS — F988 Other specified behavioral and emotional disorders with onset usually occurring in childhood and adolescence: Secondary | ICD-10-CM

## 2011-04-05 NOTE — Progress Notes (Signed)
   THERAPIST PROGRESS NOTE  Session Time: 1:00 - 2:00 a.m.  Participation Level: Active  Behavioral Response: Well GroomedAlertAnxious  Type of Therapy: Individual Therapy  Treatment Goals addressed: Coping  Interventions: Strength-based, Assertiveness Training, Supportive and Family Systems  Summary: [name] is a 25 y.o. female who presents with anxiety and conflict with her mother.  She was referred by Jorje Guild, PA.  Patient spent the session talking about her relationship with her mother.  Patient stated that "she is tired of how her mother treats her" and told her mother she did not want to have a relationship with her if her mother continued to treat her poorly.  Patient reported her mother had a "big party for patient's godmother," invited everyone at 2 p.m. Not telling patient when the party was except to say "come to dinner."  Patient showed up around five p.m.  Patient also talked about relationship during childhood stating her mother would blame her for things that happened in the relationship between mother and father e.g., patient caught father looking at pornography, told mother, mother blamed patient for "causing problems in the marriage."  Suicidal/Homicidal: Nowithout intent/plan  Therapist Response: Discussed options for patient to cope with relationship including patient deciding how she would interact during the holidays.  Suggested she not cut off the relationship but find ways to be comfortable with the idea that mother was not going to change but patient needed to change e.g., stop fantasizing about what the relationship should be versus what it is.  Plan: Return again in 1 month.  Diagnosis: Axis I: Anxiety Disorder NOS    Axis II: Deferred    Varshini Arrants, LMFT 04/05/2011

## 2011-04-10 ENCOUNTER — Other Ambulatory Visit (HOSPITAL_COMMUNITY): Payer: Self-pay | Admitting: Physician Assistant

## 2011-04-11 ENCOUNTER — Other Ambulatory Visit (HOSPITAL_COMMUNITY): Payer: Self-pay | Admitting: Physician Assistant

## 2011-04-11 DIAGNOSIS — F411 Generalized anxiety disorder: Secondary | ICD-10-CM

## 2011-04-11 MED ORDER — BUSPIRONE HCL 15 MG PO TABS: 15.0000 mg | ORAL_TABLET | Freq: Three times a day (TID) | ORAL | Status: DC

## 2011-04-21 ENCOUNTER — Ambulatory Visit (HOSPITAL_COMMUNITY): Payer: 59 | Admitting: Physician Assistant

## 2011-04-21 DIAGNOSIS — F411 Generalized anxiety disorder: Secondary | ICD-10-CM

## 2011-04-21 DIAGNOSIS — F988 Other specified behavioral and emotional disorders with onset usually occurring in childhood and adolescence: Secondary | ICD-10-CM

## 2011-04-21 MED ORDER — ALPRAZOLAM 0.25 MG PO TABS: 0.2500 mg | ORAL_TABLET | Freq: Every day | ORAL | Status: AC | PRN

## 2011-04-22 NOTE — Progress Notes (Signed)
   [facility] Follow-up Outpatient Visit  [name] 12/18/1985  Date: 04/21/11   Subjective: [name] presents today complaining of some increased anxiety. That seems to be situationally provoked. She reports that at times while at work. She becomes extremely self-conscious and finds it very difficult to concentrate and is more likely to make errors. There are also times when her anxiety from work carries over and makes falling asleep, difficult.  When asked she describes mild panic attacks. Otherwise, she reports she is doing well. She denies any suicidal or homicidal ideation. She denies any auditory or visual hallucinations.  There were no vitals filed for this visit.  Mental Status Examination  Appearance: Well groomed and casually dressed Alert: Yes Attention: good  Cooperative: Yes Eye Contact: Good Speech: Clear and even Psychomotor Activity: Normal Memory/Concentration: Intact Oriented: person, place, time/date and situation Mood: Anxious Affect: Congruent Thought Processes and Associations: Linear Fund of Knowledge: Good Thought Content:  Insight: Good Judgement: Good  Diagnosis: Generalized anxiety disorder  Treatment Plan: We will continue her Wellbutrin and BuSpar as they seem to be helpful. We will add a low dose of Xanax to be taken on an as-needed basis. This Clinical research associate had a long discussion with the patient regarding side effects to Xanax, as well as concerns of dependence and abuse, [name] will followup in 2 months.  Urian Martenson, PA

## 2011-04-25 ENCOUNTER — Ambulatory Visit (HOSPITAL_COMMUNITY): Payer: 59 | Admitting: Marriage and Family Therapist

## 2011-04-28 ENCOUNTER — Other Ambulatory Visit: Payer: Self-pay

## 2011-05-11 ENCOUNTER — Ambulatory Visit (INDEPENDENT_AMBULATORY_CARE_PROVIDER_SITE_OTHER): Payer: 59 | Admitting: Marriage and Family Therapist

## 2011-05-11 DIAGNOSIS — F900 Attention-deficit hyperactivity disorder, predominantly inattentive type: Secondary | ICD-10-CM

## 2011-05-11 DIAGNOSIS — F988 Other specified behavioral and emotional disorders with onset usually occurring in childhood and adolescence: Secondary | ICD-10-CM

## 2011-05-11 DIAGNOSIS — F410 Panic disorder [episodic paroxysmal anxiety] without agoraphobia: Secondary | ICD-10-CM

## 2011-05-11 NOTE — Progress Notes (Signed)
  THERAPIST PROGRESS NOTE  Session Time:  8:00 - 9:00 a.m.  Participation Level: Active  Behavioral Response: Well GroomedAlertAnxious  Type of Therapy: Individual Therapy  Treatment Goals addressed: Coping  Interventions: Strength-based, Assertiveness Training, Supportive and Family Systems  Summary: [name] is a 26 y.o. female who presents with anxiety and depression.  She He was referred by Jorje Guild, PA. Patient talked about her relationship with her mother.  She reports she has not spoken to her mother since before the holidays.  She reports having a lot of anxiety about this since her brother's upcoming wedding will entail her spending time with her mother.  Patient states that she does not feel strong enough to confront mother about the conflict between then yet.  She also talked about how difficult it is for her to confront anyone or to talk about how she feels, leaving her anxious, unable to sleep or shut her mind off.  Suicidal/Homicidal: Nowithout intent/plan  Therapist Response:  Discussed systemically patient viewing mother negatively to the point that she cannot detach or see mother for who she is.  Also discussed patient's self-esteem and her belief that she is not important enough or her problems are not important enough to reach out to others.  The plan is for patient to learn detaching from how she interacts with mother and to learn to reach out to others more, and to learn to say "no" more often and be comfortable with same.  Plan: Return again in 2 weeks.  Diagnosis: Axis I: Anxiety D/O, panic type; Depressive D/O, NOS    Axis II: Deferred    Janese Radabaugh, LMFT 05/11/2011

## 2011-05-18 ENCOUNTER — Other Ambulatory Visit (HOSPITAL_COMMUNITY): Payer: Self-pay | Admitting: Physician Assistant

## 2011-05-18 DIAGNOSIS — F411 Generalized anxiety disorder: Secondary | ICD-10-CM

## 2011-06-03 ENCOUNTER — Other Ambulatory Visit (HOSPITAL_COMMUNITY): Payer: Self-pay

## 2011-06-06 ENCOUNTER — Other Ambulatory Visit (HOSPITAL_COMMUNITY): Payer: Self-pay | Admitting: Physician Assistant

## 2011-06-06 DIAGNOSIS — F411 Generalized anxiety disorder: Secondary | ICD-10-CM

## 2011-06-06 MED ORDER — ALPRAZOLAM 0.25 MG PO TABS: 0.2500 mg | ORAL_TABLET | Freq: Every day | ORAL | Status: AC | PRN

## 2011-06-07 ENCOUNTER — Ambulatory Visit (HOSPITAL_COMMUNITY): Payer: 59 | Admitting: Marriage and Family Therapist

## 2011-06-21 ENCOUNTER — Ambulatory Visit (INDEPENDENT_AMBULATORY_CARE_PROVIDER_SITE_OTHER): Payer: 59 | Admitting: Marriage and Family Therapist

## 2011-06-21 DIAGNOSIS — F988 Other specified behavioral and emotional disorders with onset usually occurring in childhood and adolescence: Secondary | ICD-10-CM

## 2011-06-21 DIAGNOSIS — F411 Generalized anxiety disorder: Secondary | ICD-10-CM

## 2011-06-21 NOTE — Progress Notes (Signed)
   THERAPIST PROGRESS NOTE  Session Time: 8:00 - 9:00 a.m.  Participation Level: Active  Behavioral Response: CasualAlertAnxious and Depressed  Type of Therapy: Individual Therapy  Treatment Goals addressed: Coping  Interventions: Strength-based, Assertiveness Training, Supportive and Family Systems  Summary: [name] is a 26 y.o. female who presents with anxiety and depression.  She was referred by Jorje Guild, PA.  Patient cried throughout the session stating that she has recognized that her anxiety is affecting her more than she realized.  She reports trying being in the moment trying to breathe and being unable to calm herself.  Patient reports feeling overwhelmed at work when when she works three 12 hour shift, at home now that she is trying to buy a house, and with her relationships with family members.  Patient cried stating she is afraid she is going to eventually have a heart attack due to the severity of her anxiety, just like her grandparents did.  Patient talked about how she copes with her anxiety, going to the gym, talking to her husband, although she waits too late, and doing her breathing, again at times too late.  Suicidal/Homicidal: Nowithout intent/plan  Therapist Response:  Discussed the need to find other avenues to dispel energy through exercise.  Plan: Return again in 2 weeks.  Diagnosis: Axis I: Anxiety D/O, Panic Type; ADHD    Axis II: Deferred    Avanish Cerullo, LMFT 06/21/2011

## 2011-06-22 ENCOUNTER — Ambulatory Visit (INDEPENDENT_AMBULATORY_CARE_PROVIDER_SITE_OTHER): Payer: 59 | Admitting: Physician Assistant

## 2011-06-22 DIAGNOSIS — F331 Major depressive disorder, recurrent, moderate: Secondary | ICD-10-CM

## 2011-06-22 DIAGNOSIS — F411 Generalized anxiety disorder: Secondary | ICD-10-CM

## 2011-06-22 MED ORDER — BUSPIRONE HCL 15 MG PO TABS: 15.0000 mg | ORAL_TABLET | Freq: Three times a day (TID) | ORAL | Status: DC

## 2011-06-22 MED ORDER — BUPROPION HCL ER (XL) 300 MG PO TB24: 300.0000 mg | ORAL_TABLET | ORAL | Status: DC

## 2011-06-22 NOTE — Progress Notes (Signed)
   [facility] Follow-up Outpatient Visit  [name] 08/14/1985  Date: 06/22/11   Subjective: [name] presents today to follow up on her medications for anxiety and depression. She reports that she is experiencing some difficulties in her family life. She reports that she is taking a half of a Xanax tablet about 3 nights a week to help with sleep. She continues to see Cleophas Dunker every 2 weeks, and is trying to use her heart math to reduce her anxiety level. She endorses difficulty in initiating sleep, and may not fall asleep until 5 AM. She denies any suicidal or homicidal thoughts. She denies any auditory or visual hallucinations.  There were no vitals filed for this visit.  Mental Status Examination  Appearance: Casual Alert: Yes Attention: good  Cooperative: Yes Eye Contact: Good Speech: Clear and even Psychomotor Activity: Normal Memory/Concentration: Intact Oriented: person, place, time/date and situation Mood: Depressed Affect: Constricted and Tearful Thought Processes and Associations: Logical Fund of Knowledge: Good Thought Content:  Insight: Good Judgement: Good  Diagnosis: Generalized anxiety disorder, major depressive disorder recurrent moderate  Treatment Plan: We will continue her Wellbutrin XL 300 mg daily, BuSpar 15 mg 3 times daily, and Xanax 0.25 mg daily as needed. She has been instructed in some relaxation techniques to aid in her initiating sleep. She will followup in 3 months  Danyl Deems, PA

## 2011-07-05 ENCOUNTER — Ambulatory Visit (INDEPENDENT_AMBULATORY_CARE_PROVIDER_SITE_OTHER): Payer: 59 | Admitting: Marriage and Family Therapist

## 2011-07-05 DIAGNOSIS — F411 Generalized anxiety disorder: Secondary | ICD-10-CM

## 2011-07-05 NOTE — Progress Notes (Signed)
   THERAPIST PROGRESS NOTE  Session Time:  8:00 - 9:00 a.m.  Participation Level: Active  Behavioral Response: NeatAlertAnxious  Type of Therapy: Individual Therapy  Treatment Goals addressed: Coping  Interventions: Strength-based, Assertiveness Training and Supportive  Summary: [name] is a 25 y.o. female who presents with  Anxiety and ADHD.  She was referred by Jorje Guild, PA. Patient talked about three issues.  Patient talked about having her husband in to talk about how therapist and husband could help to work as a team to alleviate her anxiety.  She reports that she and her husband are in the process of buying a new house and that she has been thinking of ways to make her home calming for her.  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  She is very anxious about this training and doing the job and is fearful is making a mistake leading to someone dying.  Suicidal/Homicidal: Nowithout intent/plan  Therapist Response:  Discussed a plan where patient feels like she has control over how a session would go with having her husband in to help alleviate patient's anxiety.  Also discussed various ways her home could help calm herself including gardening, cooking, using the upstairs as her safe place. Discussed a plan where patient will ask her boss to set up a meeting to discuss patient having a 12 hour shift to shadow a triage nurse for free so she can learn the job more effectively than nurses are now learning the job in order for patient to alleviate her own anxiety.   Plan: Return again in 2 weeks.  Diagnosis: Axis I: Anxiety d/o; ADHD    Axis II: Deferred    Madelene Kaatz, LMFT 07/05/2011

## 2011-07-19 ENCOUNTER — Ambulatory Visit (HOSPITAL_COMMUNITY): Payer: 59 | Admitting: Marriage and Family Therapist

## 2011-07-19 DIAGNOSIS — F988 Other specified behavioral and emotional disorders with onset usually occurring in childhood and adolescence: Secondary | ICD-10-CM

## 2011-07-19 DIAGNOSIS — F411 Generalized anxiety disorder: Secondary | ICD-10-CM

## 2011-07-19 NOTE — Progress Notes (Unsigned)
   THERAPIST PROGRESS NOTE  Session Time:  9:00 - 10:00 a.m.  Participation Level: Active  Behavioral Response: CasualAlertAnxious  Type of Therapy: Individual Therapy  Treatment Goals addressed: Coping  Interventions: Strength-based, Assertiveness Training and Supportive  Summary: [name] is a 26 y.o. 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  She was referred by Jorje Guild, PA. 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  She reports it was easier than she thought it was going to be but believed it was because she was able to talk about it before being trained as the key.  She reports still that it is not a good fit but that she would not be doing this job on a regular basis.  She also reports she will be moving into her new home this weekend.  Patient talked about ways she needs make feel less anxious.  Also discuss the fact that although patient states she knows she needs to talk more about her anxiety she does not know what holds her back from doing this.  She reports she starts to cry when it comes to opening up to others.  She wants to bring her husband in and states he is open to coming in to help her with her anxiety.  Suicidal/Homicidal: Nowithout intent/plan  Therapist Response:  Made a plan to have husband in to specifically discuss what is holding patient back from opening up to others to help patient calm her anxiety.  Plan: Return again in 2 weeks.  Diagnosis: Axis I: 300.02; ADD    Axis II: Deferred    Aspen Lawrance, LMFT 07/19/2011

## 2011-07-20 ENCOUNTER — Other Ambulatory Visit (HOSPITAL_COMMUNITY): Payer: Self-pay | Admitting: Physician Assistant

## 2011-07-20 DIAGNOSIS — F331 Major depressive disorder, recurrent, moderate: Secondary | ICD-10-CM

## 2011-07-31 ENCOUNTER — Emergency Department (HOSPITAL_BASED_OUTPATIENT_CLINIC_OR_DEPARTMENT_OTHER)
Admission: EM | Admit: 2011-07-31 | Discharge: 2011-07-31 | Disposition: A | Discharge status: Home / Self Care | Payer: 59 | Attending: Emergency Medicine | Admitting: Emergency Medicine

## 2011-07-31 ENCOUNTER — Encounter (HOSPITAL_BASED_OUTPATIENT_CLINIC_OR_DEPARTMENT_OTHER): Payer: Self-pay | Admitting: *Deleted

## 2011-07-31 DIAGNOSIS — F411 Generalized anxiety disorder: Secondary | ICD-10-CM | POA: Insufficient documentation

## 2011-07-31 DIAGNOSIS — R112 Nausea with vomiting, unspecified: Secondary | ICD-10-CM | POA: Insufficient documentation

## 2011-07-31 DIAGNOSIS — Z87891 Personal history of nicotine dependence: Secondary | ICD-10-CM | POA: Insufficient documentation

## 2011-07-31 DIAGNOSIS — R1110 Vomiting, unspecified: Secondary | ICD-10-CM

## 2011-07-31 DIAGNOSIS — R197 Diarrhea, unspecified: Secondary | ICD-10-CM | POA: Insufficient documentation

## 2011-07-31 DIAGNOSIS — F3289 Other specified depressive episodes: Secondary | ICD-10-CM | POA: Insufficient documentation

## 2011-07-31 DIAGNOSIS — Z981 Arthrodesis status: Secondary | ICD-10-CM | POA: Insufficient documentation

## 2011-07-31 DIAGNOSIS — R1084 Generalized abdominal pain: Secondary | ICD-10-CM | POA: Insufficient documentation

## 2011-07-31 DIAGNOSIS — F329 Major depressive disorder, single episode, unspecified: Secondary | ICD-10-CM | POA: Insufficient documentation

## 2011-07-31 DIAGNOSIS — K219 Gastro-esophageal reflux disease without esophagitis: Secondary | ICD-10-CM | POA: Insufficient documentation

## 2011-07-31 MED ORDER — DEXTROSE 5 % IV SOLN
Freq: Once | INTRAVENOUS | Status: DC
Start: 2011-07-31 — End: 2011-07-31

## 2011-07-31 MED ORDER — ONDANSETRON HCL 4 MG PO TABS: 4.0000 mg | ORAL_TABLET | Freq: Four times a day (QID) | ORAL | Status: AC

## 2011-07-31 MED ORDER — SODIUM CHLORIDE 0.9 % IV BOLUS (SEPSIS)
1000.0000 mL | Freq: Once | INTRAVENOUS | Status: AC
  Administered 2011-07-31: 1000 mL via INTRAVENOUS

## 2011-07-31 MED ORDER — ONDANSETRON HCL 4 MG/2ML IJ SOLN
4.0000 mg | Freq: Once | INTRAMUSCULAR | Status: AC
  Administered 2011-07-31: 4 mg via INTRAVENOUS
  Filled 2011-07-31: qty 2

## 2011-07-31 NOTE — ED Provider Notes (Signed)
History     CSN: [id number]  Arrival date & time 07/31/11  0039   First MD Initiated Contact with Patient 07/31/11 0100      Chief Complaint  Patient presents with  . Abdominal Pain    (Consider location/radiation/quality/duration/timing/severity/associated sxs/prior treatment) Patient is a 26 y.o. female presenting with abdominal pain. The history is provided by the patient.  Abdominal Pain The primary symptoms of the illness include abdominal pain, nausea, vomiting and diarrhea. The primary symptoms of the illness do not include dysuria, vaginal discharge or vaginal bleeding. Primary symptoms comment: Abdominal cramping The current episode started 6 to 12 hours ago. The onset of the illness was sudden. The problem has been gradually worsening.  The abdominal pain is generalized. The abdominal pain does not radiate. The severity of the abdominal pain is 2/10. The abdominal pain is relieved by nothing. The abdominal pain is exacerbated by vomiting.  Vomiting occurs more than 10 times per day. The emesis contains stomach contents.  The diarrhea began today. The diarrhea is watery. The diarrhea occurs 5 to 10 times per day.  The patient states that she believes she is currently not pregnant. Additional symptoms associated with the illness include chills and anorexia. Symptoms associated with the illness do not include urgency, hematuria, frequency or back pain.    Past Medical History  Diagnosis Date  . Depression     se[facility]je Guild PA [facility]  . Chronic headaches   . GERD (gastroesophageal reflux disease)   . HPV (human papilloma virus) infection   . Anxiety   . Abnormal Pap smear of cervix   . Gynecological examination     sees Dr. Henderson Cloud  . Tracheomalacia, congenital   . Brachydactyly of fingers     Past Surgical History  Procedure Date  . Hand surgery 1996    to elongate the right 3rd metacarpal   . Lumbar fusion 2008    per Dr. Gerlene Fee on L4-5 for  spondylolisthesis     Family History  Problem Relation Age of Onset  . Arthritis Mother   . Depression Mother   . Hyperlipidemia Father   . Heart disease Father   . Hypertension Father   . Arthritis Father   . Arthritis Brother   . Psoriasis Brother   . Uterine cancer Maternal Aunt     x 2  . Colon cancer Neg Hx     History  Substance Use Topics  . Smoking status: Former Games developer  . Smokeless tobacco: Never Used  . Alcohol Use: 1.0 oz/week    2 drink(s) per week    OB History    Grav Para Term Preterm Abortions TAB SAB Ect Mult Living                  Review of Systems  Constitutional: Positive for chills.  Gastrointestinal: Positive for nausea, vomiting, abdominal pain, diarrhea and anorexia.  Genitourinary: Negative for dysuria, urgency, frequency, hematuria, vaginal bleeding and vaginal discharge.  Musculoskeletal: Negative for back pain.  All other systems reviewed and are negative.    Allergies  Review of patient's allergies indicates no known allergies.  Home Medications   Current Outpatient Rx  Name Route Sig Dispense Refill  . BUPROPION HCL ER (XL) 300 MG PO TB24  TAKE 1 TABLET BY MOUTH EVERY DAY 30 tablet 1  . BUSPIRONE HCL 15 MG PO TABS Oral Take 1 tablet (15 mg total) by mouth 3 (three) times daily. 90 tablet 2  .  HYDROCODONE-ACETAMINOPHEN 10-660 MG PO TABS Oral Take 1 tablet by mouth every 6 (six) hours as needed (pain). 60 each 0  . ONE-DAILY MULTI VITAMINS PO TABS Oral Take 1 tablet by mouth daily.      [name] ESTRADIOL 1-35 MG-MCG PO TABS Oral Take 1 tablet by mouth daily.      Marland Kitchen OMEPRAZOLE 40 MG PO CPDR  TAKE ONE CAPSULE BY MOUTH EVERY DAY 30 capsule 6  . RANITIDINE HCL 75 MG PO TABS Oral Take 75 mg by mouth daily.      Marland Kitchen VITAMIN B-12 1000 MCG PO TABS Oral Take 1,000 mcg by mouth daily.        BP 113/61  Pulse 92  Temp(Src) 97.5 F (36.4 C) (Oral)  Resp 20  Ht 5\' 1"  (1.549 m)  Wt 130 lb (58.968 kg)  BMI 24.56 kg/m2  SpO2  100%  LMP 07/17/2011  Physical Exam  Nursing note and vitals reviewed. Constitutional: She is oriented to person, place, and time. She appears well-developed and well-nourished. She appears distressed.  HENT:  Head: Normocephalic and atraumatic.  Mouth/Throat: Mucous membranes are dry.  Eyes: EOM are normal. Pupils are equal, round, and reactive to light.  Cardiovascular: Normal rate, regular rhythm, normal heart sounds and intact distal pulses.  Exam reveals no friction rub.   No murmur heard. Pulmonary/Chest: Effort normal and breath sounds normal. She has no wheezes. She has no rales.  Abdominal: Soft. Bowel sounds are normal. She exhibits no distension. There is no tenderness. There is no rebound and no guarding.  Musculoskeletal: Normal range of motion. She exhibits no tenderness.       No edema  Neurological: She is alert and oriented to person, place, and time. No cranial nerve deficit.  Skin: Skin is warm and dry. No rash noted.  Psychiatric: She has a normal mood and affect. Her behavior is normal.    ED Course  Procedures (including critical care time)   Labs Reviewed  PREGNANCY, URINE  URINALYSIS, ROUTINE W REFLEX MICROSCOPIC   No results found.   No diagnosis found.    MDM   Pt with symptoms most consistent with a viral process with vomitting/diarrhea.  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 Denies bad food exposure and recent travel out of the country.  No recent abx.  No hx concerning for GU pathology or kidney stones.  Pt is awake and alert on exam without peritoneal signs, but does appear dehydrated.  No symptoms concerning for appendicitis, cholecystitis, diverticulitis or UTI.  The patient given IV fluids and Zofran and will recheck.  3:25 AM No further vomiting on exam. After 2 mg of Zofran the nausea has improved. She was trying a few ice chips and mild return of nausea. Will continue D5 half-normal  saline at maintenance and continue to encourage fluids.  3:58 AM Patient much better and tolerating po's. Discharged     Gwyneth Sprout, MD 07/31/11 [phone]

## 2011-07-31 NOTE — ED Notes (Signed)
MD at bedside. 

## 2011-07-31 NOTE — ED Notes (Signed)
Husband states that pt began vomiting at 2200. Diarrhea and dizziness onset 2300. At midnight, she was "somewhat incoherent" and he brought her to the ED.

## 2011-07-31 NOTE — Discharge Instructions (Signed)
Diet for Diarrhea, Adult Having frequent, runny stools (diarrhea) has many causes. Diarrhea may be caused or worsened by food or drink. Diarrhea may be relieved by changing your diet. IF YOU ARE NOT TOLERATING SOLID FOODS:  Drink enough water and fluids to keep your urine clear or pale yellow.   Avoid sugary drinks and sodas as well as milk-based beverages.   Avoid beverages containing caffeine and alcohol.   You may try rehydrating beverages. You can make your own by following this recipe:    tsp table salt.    tsp baking soda.   ? tsp salt substitute (potassium chloride).   1 tbs + 1 tsp sugar.   1 qt water.  As your stools become more solid, you can start eating solid foods. Add foods one at a time. If a certain food causes your diarrhea to get worse, avoid that food and try other foods. A low fiber, low-fat, and lactose-free diet is recommended. Small, frequent meals may be better tolerated.  Starches  Allowed:  White, French, and pita breads, plain rolls, buns, bagels. Plain muffins, matzo. Soda, saltine, or graham crackers. Pretzels, melba toast, zwieback. Cooked cereals made with water: cornmeal, farina, cream cereals. Dry cereals: refined corn, wheat, rice. Potatoes prepared any way without skins, refined macaroni, spaghetti, noodles, refined rice.   Avoid:  Bread, rolls, or crackers made with whole wheat, multi-grains, rye, bran seeds, nuts, or coconut. Corn tortillas or taco shells. Cereals containing whole grains, multi-grains, bran, coconut, nuts, or raisins. Cooked or dry oatmeal. Coarse wheat cereals, granola. Cereals advertised as "high-fiber." Potato skins. Whole grain pasta, wild or brown rice. Popcorn. Sweet potatoes/yams. Sweet rolls, doughnuts, waffles, pancakes, sweet breads.  Vegetables  Allowed: Strained tomato and vegetable juices. Most well-cooked and canned vegetables without seeds. Fresh: Tender lettuce, cucumber without the skin, cabbage, spinach, bean  sprouts.   Avoid: Fresh, cooked, or canned: Artichokes, baked beans, beet greens, broccoli, Brussels sprouts, corn, kale, legumes, peas, sweet potatoes. Cooked: Green or red cabbage, spinach. Avoid large servings of any vegetables, because vegetables shrink when cooked, and they contain more fiber per serving than fresh vegetables.  Fruit  Allowed: All fruit juices except prune juice. Cooked or canned: Apricots, applesauce, cantaloupe, cherries, fruit cocktail, grapefruit, grapes, kiwi, mandarin oranges, peaches, pears, plums, watermelon. Fresh: Apples without skin, ripe banana, grapes, cantaloupe, cherries, grapefruit, peaches, oranges, plums. Keep servings limited to  cup or 1 piece.   Avoid: Fresh: Apple with skin, apricots, mango, pears, raspberries, strawberries. Prune juice, stewed or dried prunes. Dried fruits, raisins, dates. Large servings of all fresh fruits.  Meat and Meat Substitutes  Allowed: Ground or well-cooked tender beef, ham, veal, lamb, pork, or poultry. Eggs, plain cheese. Fish, oysters, shrimp, lobster, other seafoods. Liver, organ meats.   Avoid: Tough, fibrous meats with gristle. Peanut butter, smooth or chunky. Cheese, nuts, seeds, legumes, dried peas, beans, lentils.  Milk  Allowed: Yogurt, lactose-free milk, kefir, drinkable yogurt, buttermilk, soy milk.   Avoid: Milk, chocolate milk, beverages made with milk, such as milk shakes.  Soups  Allowed: Bouillon, broth, or soups made from allowed foods. Any strained soup.   Avoid: Soups made from vegetables that are not allowed, cream or milk-based soups.  Desserts and Sweets  Allowed: Sugar-free gelatin, sugar-free frozen ice pops made without sugar alcohol.   Avoid: Plain cakes and cookies, pie made with allowed fruit, pudding, custard, cream pie. Gelatin, fruit, ice, sherbet, frozen ice pops. Ice cream, ice milk without nuts. Plain hard candy,   honey, jelly, molasses, syrup, sugar, chocolate syrup, gumdrops,  marshmallows.  Fats and Oils  Allowed: Avoid any fats and oils.   Avoid: Seeds, nuts, olives, avocados. Margarine, butter, cream, mayonnaise, salad oils, plain salad dressings made from allowed foods. Plain gravy, crisp bacon without rind.  Beverages  Allowed: Water, decaffeinated teas, oral rehydration solutions, sugar-free beverages.   Avoid: Fruit juices, caffeinated beverages (coffee, tea, soda or pop), alcohol, sports drinks, or lemon-lime soda or pop.  Condiments  Allowed: Ketchup, mustard, horseradish, vinegar, cream sauce, cheese sauce, cocoa powder. Spices in moderation: allspice, basil, bay leaves, celery powder or leaves, cinnamon, cumin powder, curry powder, ginger, mace, marjoram, onion or garlic powder, oregano, paprika, parsley flakes, ground pepper, rosemary, sage, savory, tarragon, thyme, turmeric.   Avoid: Coconut, honey.  Weight Monitoring: Weigh yourself every day. You should weigh yourself in the morning after you urinate and before you eat breakfast. Wear the same amount of clothing when you weigh yourself. Record your weight daily. Bring your recorded weights to your clinic visits. Tell your caregiver right away if you have gained 3 lb/1.4 kg or more in 1 day, 5 lb/2.3 kg in a week, or whatever amount you were told to report. SEEK IMMEDIATE MEDICAL CARE IF:   You are unable to keep fluids down.   You start to throw up (vomit) or diarrhea keeps coming back (persistent).   Abdominal pain develops, increases, or can be felt in one place (localizes).   You have an oral temperature above 102 F (38.9 C), not controlled by medicine.   Diarrhea contains blood or mucus.   You develop excessive weakness, dizziness, fainting, or extreme thirst.  MAKE SURE YOU:   Understand these instructions.   Will watch your condition.   Will get help right away if you are not doing well or get worse.  Document Released: 07/02/2003 Document Revised: 03/31/2011 Document Reviewed:  10/23/2008 Exit[employer] Patient Information 2012 [employer]. 

## 2011-07-31 NOTE — ED Notes (Signed)
Patient unable to urinate at this time. She is aware of need for specimen

## 2011-08-02 ENCOUNTER — Ambulatory Visit (INDEPENDENT_AMBULATORY_CARE_PROVIDER_SITE_OTHER): Payer: 59 | Admitting: Marriage and Family Therapist

## 2011-08-02 DIAGNOSIS — F411 Generalized anxiety disorder: Secondary | ICD-10-CM

## 2011-08-02 NOTE — Progress Notes (Signed)
   THERAPIST PROGRESS NOTE  Session Time:  9:00 - 10:00 a.m.  Participation Level: Active  Behavioral Response: CasualAlertAnxious  Type of Therapy: Individual Therapy  Treatment Goals addressed: Coping  Interventions: CBT, Strength-based, Supportive, Anger Management Training and Family Systems  Summary: [name] is a 26 y.o. female who presents with anxiety.  She was referred by Jorje Guild, PA.  Patient reports that she is getting tired of lashing out at her husband.  She reports lashing out is part of her anxiety.  Patient was able to identify that when she is feeling uneasy or anxious and does not talk about it she then gets angry and lashes out at others including her husband.  She reports that this is what her mother does to her father.  Patient reports she has not had any contact with her mother for some months up until this past week when she texted her because of an invitation from mother to see the family for Easter.  Patient reports "my mother did a number on me.  I'm not able to open up to others and this is why I'm so anxious."     Suicidal/Homicidal: Nowithout intent/plan  Therapist Response:  Discussed patient deciding to "do something different" from her mother and address her anger as a defense mechanism.  Patient decided she wanted to continue doing CBT work again and reviewed the information for her.  She was again given the CBT information including patient beginning to identify cognitive distortions and doing a daily thought journal.  She will discuss in sessions.  Plan: Return again in 2 weeks.  Diagnosis: Axis I: GAD; ADHD    Axis II: Deferred    Lamiracle Chaidez, LMFT 08/02/2011

## 2011-08-16 ENCOUNTER — Ambulatory Visit (HOSPITAL_COMMUNITY): Payer: Self-pay | Admitting: Marriage and Family Therapist

## 2011-08-30 NOTE — Progress Notes (Signed)
   THERAPIST PROGRESS NOTE  Session Time:  8:00 - 9:00 a.m.  Participation Level: Active  Behavioral Response: CasualAlertAnxious  Type of Therapy: Individual Therapy  Treatment Goals addressed: Coping  Interventions: Assertiveness Training, Supportive and Family Systems  Summary: Hortence Charter is a 26 y.o. female who presents with anxiety and ADD.  She was referred by Jorje Guild, PA.  Patient started crying as soon as she sat down in the office.  She reports she and her husband have been having marital problems specifically having to do with patient being unable to open up to him.  She also states that she tends to not open up and then lashes out to him.  She reports they have not spoken in two weeks.    Suicidal/Homicidal: Nowithout intent/plan  Therapist Response:  Patient talked at length about what is affecting her and was able to identify that it came from being angry at her parents and that opening up is something she has been unable to do due to how her mother treated her.  Also discussed where in her life she received support and patient was able to identify her mother-in-law who she reports has been very supportive.  Patient was able to take responsibility for her actions towards her husband.  Also gave homework assignment that she say to herself "this is not about my husband" when she lashes out at him.  Plan: Return again in 2 weeks.  Diagnosis: Axis I: Generalized anxiety disorder; ADD    Axis II: Deferred    Onyx Schirmer, LMFT 08/30/2011

## 2011-08-31 ENCOUNTER — Ambulatory Visit (INDEPENDENT_AMBULATORY_CARE_PROVIDER_SITE_OTHER): Payer: 59 | Admitting: Marriage and Family Therapist

## 2011-08-31 DIAGNOSIS — F988 Other specified behavioral and emotional disorders with onset usually occurring in childhood and adolescence: Secondary | ICD-10-CM

## 2011-08-31 DIAGNOSIS — F411 Generalized anxiety disorder: Secondary | ICD-10-CM

## 2011-09-14 ENCOUNTER — Ambulatory Visit (INDEPENDENT_AMBULATORY_CARE_PROVIDER_SITE_OTHER): Payer: 59 | Admitting: Marriage and Family Therapist

## 2011-09-14 DIAGNOSIS — F411 Generalized anxiety disorder: Secondary | ICD-10-CM

## 2011-09-14 DIAGNOSIS — F988 Other specified behavioral and emotional disorders with onset usually occurring in childhood and adolescence: Secondary | ICD-10-CM

## 2011-09-14 NOTE — Progress Notes (Signed)
   THERAPIST PROGRESS NOTE  Session Time:  8:00 - 9:00 a.m.  Participation Level: Active  Behavioral Response: CasualAlertAnxious  Type of Therapy: Individual Therapy  Treatment Goals addressed: Coping  Interventions: Assertiveness Training, Supportive, Anger Management Training and Family Systems  Summary: Fadumo Heng is a 27 y.o. female who presents with anxiety and ADD.  She was referred by Jorje Guild, PA. Patient also recently reported that her anxiety and lack of communication leads to her lashing out at her husband, causing problems in their marriage.  Patient's husband Amalia Hailey was present for the session to discuss problems associated with their lack of communication.  Husband reports he has been trying to help patient when she is upset and also has problems communicating with his wife in normal, everyday conversations.  He reports he "tries to figure out how to discuss topics but any topic opens up more conflict."  Patient states that rather than communication he "shuts down."  The patient began to cry stating she does not want to hurt him.  Patient stated she does not know why she cannot open up.  The couple also talked about when they were in therapy before because husband could not stop looking at Internet pornography.  They both agreed that this at times is still an issue in how they are dealing with patient still having difficulty trusting him and patient admits she still feels insecure at times (both stated there has not been a problem for a long time).   Suicidal/Homicidal: Nowithout intent/plan  Therapist Response:  Discussed patterns of communication relating to couples who avoid conflict and/or have difficulty opening up.  Homework assignment is for the couple every day to talk for 15 minutes.  Patient will talk for 7 1/2 minutes without interruption from husband and husband will do the same in order to begin to practice communicating.  They can talk about anything.  Educated  couple about emotionally-focused couples therapy specifically to discuss the defenses they are using to not let each other know how they are really feeling.  The couple will identify how they are feeling and will tell the other person in order to stop being so defensive around patient still feeling insecure regarding husband's past Internet use.  Will see couple at least one more time to give handouts on communication and review their progress.  If the couple cannot move forward will refer for marital counseling.  Plan: Return again in 2 weeks.  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

## 2011-09-19 ENCOUNTER — Other Ambulatory Visit (HOSPITAL_COMMUNITY): Payer: Self-pay | Admitting: Physician Assistant

## 2011-09-20 ENCOUNTER — Ambulatory Visit (INDEPENDENT_AMBULATORY_CARE_PROVIDER_SITE_OTHER): Payer: 59 | Admitting: Physician Assistant

## 2011-09-20 DIAGNOSIS — F411 Generalized anxiety disorder: Secondary | ICD-10-CM

## 2011-09-20 HISTORY — DX: Generalized anxiety disorder: F41.1

## 2011-09-20 MED ORDER — BUSPIRONE HCL 15 MG PO TABS: 15.0000 mg | ORAL_TABLET | Freq: Three times a day (TID) | ORAL | Status: DC

## 2011-09-20 MED ORDER — BUPROPION HCL ER (XL) 300 MG PO TB24: 300.0000 mg | ORAL_TABLET | Freq: Every day | ORAL | Status: DC

## 2011-09-20 MED ORDER — ALPRAZOLAM 0.25 MG PO TBDP: 0.2500 mg | ORAL_TABLET | Freq: Every evening | ORAL | Status: DC | PRN

## 2011-09-20 MED ORDER — ALPRAZOLAM 0.25 MG PO TABS: 0.2500 mg | ORAL_TABLET | Freq: Every evening | ORAL | Status: AC | PRN

## 2011-09-20 NOTE — Progress Notes (Signed)
   [facility] Behavioral Health Follow-up Outpatient Visit  Triana Coover 11-Apr-1986  Date: 5/28 13   Subjective: [name] presents today to followup on her medications prescribed for depression and anxiety. She reports that she and her husband recently bought a home and have moved. 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 She reports that she has good days and bad days. She endorses some dark days that last about one night. She states it her sleep is okay and her appetite is good. She does endorse having a mild panic attack about once a week and she takes a half a Xanax for that. She denies any homicidal ideation or auditory or visual hallucinations. She does endorse some passive suicidal ideation but denies any plan or intent.  There were no vitals filed for this visit.  Mental Status Examination  Appearance: Well groomed and casually dressed Alert: Yes Attention: good  Cooperative: Yes Eye Contact: Good Speech: Clear and coherent Psychomotor Activity: Normal Memory/Concentration: Intact Oriented: person, place, time/date and situation Mood: Anxious Affect: Restricted Thought Processes and Associations: Linear Fund of Knowledge: Good Thought Content: Suicidal ideation Insight: Good Judgement: Good  Diagnosis: Generalized anxiety disorder  Treatment Plan: We will continue her Wellbutrin XL 300 mg daily, BuSpar 15 mg 3 times daily, and Xanax 0.25 mg daily as needed. She will return for followup in 3 months. She will continue to see her therapist bi-weekly.  Sharan Mcenaney, PA-C

## 2011-09-27 NOTE — Progress Notes (Signed)
   THERAPIST PROGRESS NOTE  Session Time:  8:00 - 9:00 a.m.  Participation Level: Active  Behavioral Response: CasualAlertAnxious  Type of Therapy:  Family counseling  Treatment Goals addressed: Coping  Interventions: Strength-based, Supportive and Family Systems  Summary: Kennetha Pearman is a 26 y.o. female who presents with anxiety.  She was referred by Jorje Guild, PA.  Patient was not present with her husband.  Patient states that her emotions are "up and down" and that she would like to be able to come in and say things were good.  Patient reports doing the exercise with her husband and has noticed that she does have a difficult time starting conversations.  She was eventually able to open up during the exercise.  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  Discussed her anxiety relating to the interview.    Suicidal/Homicidal: Nowithout intent/plan  Therapist Response:   Discussed homework assignment.  Also gave patient another homework assignment to help her open up:  A list of questions she can ask her husband and examples of other questions to do the same.  The goal is for patient to open up at a deeper level without feeling anxious or afraid.  Plan: Return again in 2 weeks.  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

## 2011-09-29 ENCOUNTER — Ambulatory Visit (INDEPENDENT_AMBULATORY_CARE_PROVIDER_SITE_OTHER): Payer: 59 | Admitting: Marriage and Family Therapist

## 2011-09-29 DIAGNOSIS — F988 Other specified behavioral and emotional disorders with onset usually occurring in childhood and adolescence: Secondary | ICD-10-CM

## 2011-09-29 DIAGNOSIS — F411 Generalized anxiety disorder: Secondary | ICD-10-CM

## 2011-09-29 DIAGNOSIS — Z630 Problems in relationship with spouse or partner: Secondary | ICD-10-CM

## 2011-10-10 ENCOUNTER — Other Ambulatory Visit: Payer: Self-pay | Admitting: Family Medicine

## 2011-10-13 ENCOUNTER — Ambulatory Visit (HOSPITAL_COMMUNITY): Payer: Self-pay | Admitting: Marriage and Family Therapist

## 2011-10-19 NOTE — Progress Notes (Signed)
   THERAPIST PROGRESS NOTE  Session Time:  8:00 - 9:00 a.m.  Participation Level: Active  Behavioral Response: CasualAlertAnxious  Type of Therapy: Individual Therapy  Treatment Goals addressed: Coping  Interventions: Family Systems  Summary: [name] is a 26 y.o. female who presents with anxiety and ADD.  She was referred by Jorje Guild, PA.  Patient was present with her husband Amalia Hailey to discuss any improvements in their relationship concerning patient's difficulty opening up and/or having anger outbursts towards husband.  Both agreed that they are doing better.  Husband reports that they filled out the handout regarding interests and what they want or do get out of the relationship (to use for 15 minute discussions in order to help patient open up more).  He reported the only problem was when things were going well they preferred not to do the exercise and when they were having a bad day did not want to do it.  Suicidal/Homicidal: Nowithout intent/plan  Therapist Response:  Discussed the reason they need to do the assignment no matter what is going on in order to develop a habit of discussing difficult subjects.  Gave the couple packet on various communication difficulties.  Also educated the couple on what assertive communication is, reviewed a handout on Cognitive Distortions in couples, and discussed listening skills.  Their Homework is to review the packet, discuss together, and come up with healthier alternatives to the way the have been communicating in the past.   Plan: Return again in 2 weeks.  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

## 2011-10-20 ENCOUNTER — Ambulatory Visit (INDEPENDENT_AMBULATORY_CARE_PROVIDER_SITE_OTHER): Payer: 59 | Admitting: Marriage and Family Therapist

## 2011-10-20 DIAGNOSIS — F411 Generalized anxiety disorder: Secondary | ICD-10-CM

## 2011-10-20 DIAGNOSIS — F988 Other specified behavioral and emotional disorders with onset usually occurring in childhood and adolescence: Secondary | ICD-10-CM

## 2011-11-01 ENCOUNTER — Ambulatory Visit (INDEPENDENT_AMBULATORY_CARE_PROVIDER_SITE_OTHER): Payer: 59 | Admitting: Marriage and Family Therapist

## 2011-11-01 DIAGNOSIS — F411 Generalized anxiety disorder: Secondary | ICD-10-CM

## 2011-11-01 DIAGNOSIS — F988 Other specified behavioral and emotional disorders with onset usually occurring in childhood and adolescence: Secondary | ICD-10-CM

## 2011-11-01 NOTE — Progress Notes (Signed)
   THERAPIST PROGRESS NOTE  Session Time:  9:00 - 10:00 a.m.  Participation Level: Active  Behavioral Response: CasualAlertAnxious  Type of Therapy: Individual Therapy  Treatment Goals addressed: Coping  Interventions: Strength-based, Supportive and Family Systems  Summary: [name] is a 26 y.o. female who presents with anxiety and ADD.  She was referred by Jorje Guild, PA.  Patient reports her relationship with her husband has been improving.  She reports he is now in school for occupational therapy and that they are talking about starting a family.  Patient states she is very excited about this idea.  She also spent most of the session talking about her job.  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  In addition, patient talked about feeling anxiety over the changes that are occurring in this system and how they are affecting her personally.  Suicidal/Homicidal: Nowithout intent/plan  Therapist Response:  Discussed ways patient can identify her anxiety before it starts to get out of hand and include soothing herself in small increments during her work day.  Discussed patient using all of her skills learned in therapy (discussed each one of them including biofeedback and CBT) before her anxiety escalates.  Patient will start taking lunch and eating lunch, something she has not done while working in order to take care of herself.    Plan: Return again in 2 weeks.  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

## 2011-11-07 ENCOUNTER — Encounter (HOSPITAL_COMMUNITY): Payer: Self-pay | Admitting: Obstetrics and Gynecology

## 2011-11-11 ENCOUNTER — Ambulatory Visit (HOSPITAL_COMMUNITY)
Admission: RE | Admit: 2011-11-11 | Discharge: 2011-11-11 | Disposition: A | Discharge status: Home / Self Care | Payer: 59 | Source: Ambulatory Visit | Attending: Obstetrics and Gynecology | Admitting: Obstetrics and Gynecology

## 2011-11-11 DIAGNOSIS — M1990 Unspecified osteoarthritis, unspecified site: Secondary | ICD-10-CM

## 2011-11-11 NOTE — ED Notes (Signed)
BP- 118/77, P-86, Wt- 132lb

## 2011-11-11 NOTE — Consult Note (Signed)
Maternal Fetal Medicine Consultation  Requesting Provider(s): Vernie Ammons, MD  Reason for consultation: preconception counseling - autoimmune arthritis, spina bifida occulta, family hx of cleft lip  HPI: [name] is seen today for preconception counseling due to a history of autoimmune arthritis, anxiety and depression and spina bifida occulta.  She has a personal history of brachycdactyly and a family history of cleft lip (see separate note from Dentist).  [name] reports that she is currently followed by Rheumatology due to an autoimmune arthritis - does not yet meet criteria for rheumatoid arthritis (has a half brother with psoriatic arthritis).  She reports symptoms are primarily in the knees and hands.  She is currently on 5 mg daily of Prednisone that controls her symptoms.  She also has a history of anxiety and depression - currently on Wellbutrin and Buspar.  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  She underwent general endotracheal anesthesia for back surgery years ago without problems with the intubation.  During her work up for that surgery, she was noted to have spina bifida occulta -an incidental finding - no limitations or problems from this issue.  OB History: G0  PMH:  Past Medical History  Diagnosis Date  . Depression     sees Jorje Guild PA at [facility]  . Chronic headaches   . GERD (gastroesophageal reflux disease)   . HPV (human papilloma virus) infection   . Anxiety   . Abnormal Pap smear of cervix   . Gynecological examination     sees Dr. Henderson Cloud  . Tracheomalacia, congenital   . Brachydactyly of fingers     PSH:  Past Surgical History  Procedure Date  . Hand surgery 1996    to elongate the right 3rd metacarpal   . Lumbar fusion 2008    per Dr. Gerlene Fee on L4-5 for spondylolisthesis     Meds: Wellbutrin, Buspar, Prednisone 5 mg po daily, Omeprazole, Prenatal vitamins, Folic acid . 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  In general, Rheumatoid arthritis improves over the course of pregnancy and there is a reasonable chance that she may be able to be weaned off Prednisone over the course of her pregnancy.  We briefly reviewed association of Cleft Lip/ Palate and glucocorticoids as well as somewhat increased risk for PROM.  Overall, Prednisone is felt to be safe in pregnancy.  Given association with cleft lip/ palate - would recommend comprehensive ultrasound at approximately 18 weeks for anatomy. 2) Hx of spina bifida occulta - this is an incidental finding and of really no clinical significance. Her offspring are at no increased risk for open neutral tube defects beyond the general population.  The folic acid in prenatal vitamins is adequate - and at most would not give more than 1 mg daily to prevent open neural tube defects 3) History of Depression/ Anxiety - Wellbutrin and Buspar are generally considered safe in pregnancy although somewhat limited data available.  Would continue their use if benefits outweigh risks. 4)  Hx of L4/L5 fusion s/p back injury - we discussed possibility that this could preclude Epidural during labor.  Would recommend an anesthesia consult in the 3rd trimester (after conception) 5) Tracheomalacia - do not anticipate any issues during pregnancy.     Thank you for the opportunity to be a part of the care of [name]. Please contact our office if we can be of further assistance.   I spent approximately 30 minutes with this patient with over 50% of time spent in face-to-face counseling.  Alpha Gula, MD

## 2011-11-14 ENCOUNTER — Encounter (HOSPITAL_COMMUNITY): Payer: Self-pay

## 2011-11-14 DIAGNOSIS — Z8279 Family history of other congenital malformations, deformations and chromosomal abnormalities: Secondary | ICD-10-CM

## 2011-11-14 HISTORY — DX: Family history of other congenital malformations, deformations and chromosomal abnormalities: Z82.79

## 2011-11-14 NOTE — Progress Notes (Signed)
Genetic Counseling  Preconception Note  Appointment Date:  11/11/2011 Referred By: Christine Andrea, MD Date of Birth:  June 04, 1985  Attending: Alpha Gula, MD    [name] and her husband, Mr. [name], were seen for preconception genetic counseling because of a personal history of spina bifida occulta and current medication use.     Both family histories were reviewed and found to be contributory for spina bifida occulta in the patient. [name] reported that she had a lumbar fusion in 2005 given that she fractured her back on a roller coaster.  She stated that she was diagnosed with spina bifida occulta at that time. Spina bifida occulta, or closed spina bifida, often describes an absence of one or two vertebral arches, without a visible lesion, which may be discovered incidentally following an X-ray for backache or other unrelated symptoms. Some forms of spina bifida occulta can cause symptoms, such as the presence of a tethered cord. Limited information exists regarding the prevalence and underlying cause of spina bifida occulta. Studies suggest that in cases of asymptomatic spina bifida occulta, recurrence risk to relatives is not likely increased. In the case of symptomatic spinda bifida occulta, recurrence risk for spina bifida may be similar to that of open neural tube defects. Once a couple has one child with a nonsyndromic, multifactorial NTD, the risk of recurrence is estimated to be 3-5%. Given the report of [name]'s spina bifida occulta, diagnosed incidentally, recurrence risk for open spina bifida in a future pregnancy would not be expected to be increased above the general population chance. We reviewed second trimester maternal AFP screening and second trimester targeted ultrasound to assess for the presence of open neural tube defects in a pregnancy. The couple understands that screening cannot diagnose or rule out all birth defects and would not be  expected to detect closed spina bifida.   [name] also reported that she has brachydactyly on both hands. We discussed brachydactyly describes disproportionately short fingers and toes and can be an isolated trait or occur as one feature of an underlying genetic syndrome. When seen as a feature of an underlying genetic syndrome, recurrence risk depends upon the specific condition. When isolated, autosomal dominant inheritance has been observed in families.  In autosomal dominant inheritance, each offspring has a 1 in 2 (50%) chance to inherit the trait from the parent. Recurrence risk for a future pregnancy would be 50% in the case of isolated occurrence with autosomal dominant inheritance. Additional information may alter recurrence risk assessment.   Additionally, [name] reported that her brother was born with cleft lip, without palate. He also has mild cerebral palsy and received occupational therapy in the past. He is reportedly currently in good health. We discussed that cleft lip +/- cleft palate can be syndromic or isolated.  If the patient's brother has a syndromic form of clefting, the chance of having an affected child depends on the inheritance pattern of that condition.  If the patient's brother has an isolated form of clefting, we discussed the probable multifactorial inheritance and explained that genetic testing for isolated cleft lip +/- cleft palate is not currently available.  Based on the family history, this couple's chance to have a baby with an isolated cleft lip +/- cleft palate is 0.6%. We discussed that in a future pregnancy, targeted ultrasound is available to assess for oral clefting. The couple understands that ultrasound cannot diagnose or rule out all birth defects prenatally.  Additionally, [name] reported one maternal aunt with seizure disorder and mild mental retardation. It was unknown whether or not the mental delays were related to her history of seizures  or to a separate cause. The patient also reported another maternal aunt with seizure disorder and her daughter (a first cousin to the patient) with seizures disorder. An underlying cause for the seizures is not known. [name] reported not history of seizures for herself or her mother. We discussed that there are many different causes of mental retardation such as genetic differences, sporadic causes, or injuries.  A specific diagnosis for mental retardation can be determined in approximately 50% of cases.  In the remaining 50% of cases, a diagnosis may not ever be determined.  Without more specific information, potential genetic risks cannot be determined.  The patient was advised to call if she is able to find out more information regarding a specific diagnosis. Epilepsy occurs in approximately 1% of the population and can have many causes.  Approximately 80% of epilepsy is thought to be idiopathic while the remaining 20% is secondary to a variety of factors such as perinatal events, infections, trauma and genetic disease.  A specific diagnosis in an affected individual is necessary to accurately assess the risk for other family members to develop epilepsy.  In the absence of a known etiology, epilepsy is thought to be caused by a combination of genetic and environmental factors, called multifactorial inheritance. Recurrence risk for epilepsy is estimated to be 4% for offspring of an individual with primary idiopathic epilepsy. We discussed that there may be a stronger hereditary predisposition in the family given the multiple reported relatives with a seizure disorder. We discussed that in the case of multifactorial inheritance, recurrence risk is lowered with each degree of relation removed from the affected individual. We discussed screening and testing is not available in a pregnancy for epilepsy, in the absence of an identified genetic cause. It would be important for the couple's pediatrician to be aware of  this history so that their child(ren) can be screened and followed appropriately.   The patient also reported a female maternal first cousin once removed with cerebral palsy. This relative's mother reportedly was malnutritioned and used drugs during pregnancy. We discussed that a family history of cerebral palsy, in the case that the diagnoses are correct for these relatives, would not be expected to increase recurrence risk for cerebral palsy in other relatives. Without further information regarding the provided family history, an accurate genetic risk cannot be calculated. Further genetic counseling is warranted if more information is obtained.   We reviewed additional medical history for [name]. The patient reported that she takes prednisone given that she has symptoms of arthritis. She is followed by Dr. Azzie Roup, rheumatologist. Additionally, she reported a history of anxiety and depression, for which she takes Wellbutrin and BuSpar. [name] also reported using Aczone, topical medication, prescribed through her dermatologist.  She also reported currently taking omeprazole, zantac, and prenatal vitamins. She reported taking folic acid three times per day. [name] was also seen for Maternal-Fetal Medicine consultation given her history of arthritis and medication use. See separate MFM consult note for detailed discussion.   We discussed that before assessing any risk to a pregnancy, it is important to know that any time a couple conceives, there is a 3-5% chance that the baby will be affected by a birth defect or have mental retardation. For this reason, it is often difficult to determine  if a drug is responsible for causing a pregnancy loss or birth defect. Therefore, in many cases, we cannot provide specific information and can only caution about possible associations between a certain exposure and a specific birth defect. We discussed that the critical period for fetal organ  development is from the third through the eighth weeks after conception. Exposures during this time might have the potential for disrupting the beginning of the development of the biological systems.   Many factors contribute to what the effects, if any, a prenatal exposure will have on a pregnancy. Drug dosage (the quantity of drug exposed), timing (when during pregnancy did the exposure occur), the number of exposures to a substance, any drug-drug interactions with other substances taken during pregnancy, the genetic make-up of the mother, and the genetic make-up of a fetus all contribute to the consequences of any prenatal exposure(s). We discussed that we are not able to assess all of these factors when assessing the risk of an exposure in pregnancy. However, we discussed that we can assess each potential exposure individually regarding available data on use during pregnancy.   Use of prednisone and other glucocorticoids in pregnancy have been associated with an increased risk for fetal growth impairment from animal studies and reports of human use during pregnancy. Animal studies have indicated an increased risk for cleft lip and palate with prenatal use of prednisone.  Human data have indicated a possible association with prenatal use of corticosteroids and a small increased chance of cleft lip or palate.  A targeted ultrasound after 16 to [redacted] weeks gestation may detect facial clefts and is available to assess for fetal growth.  However, it is important to remember that not all clefting can be detected prenatally.     Available animal study data have not indicated an increased risk for birth defects with prenatal use of Wellbutrin (bupropion). Conflicting human study data exist regarding whether or not prenatal bupropion use has a possible association with congenital heart defects. Buspar (buspirone) is not expected to increase the risk for birth defects when used in pregnancy according to experimental  animal study data. Human data regarding prenatal use of Buspar are limited. Aczone (dapsone) use during pregnancy has been associated with a possible increased risk for hemolytic anemia in the mother and the fetus. Limited human data also report normal human pregnancy outcome after prenatal use of Aczone.   Even though a limited number of medicines are known to cause birth defects, we cannot say that it is completely safe to use any medicines during pregnancy.  It is also not possible to predict any drug-drug interactions that occur, or how they might affect a pregnancy.  The use of medications is recommended in pregnancy only if the benefit to the mother (and thus the pregnancy) outweighs potential risks to the baby.  Sometimes the maternal use of medications, dictated by a medical condition, may even be more beneficial to a pregnancy than not taking the medication(s) at all. [name] should not alter her medication use without first consulting with her physician.   [name] was provided with written information regarding cystic fibrosis (CF) including the carrier frequency and incidence in the Caucasian population, the availability of carrier testing and prenatal diagnosis if indicated.  In addition, we discussed that CF is routinely screened for as part of the [place] newborn screening panel. CF carrier screening was previously performed through her OB office and was negative for the 60 mutations analyzed through [employer].  I counseled this couple regarding the above risks and available options.  The approximate face-to-face time with the genetic counselor was 30 minutes.  Christine Plowman, MS,  Certified [employer] 11/14/2011

## 2011-11-15 ENCOUNTER — Ambulatory Visit (INDEPENDENT_AMBULATORY_CARE_PROVIDER_SITE_OTHER): Payer: 59 | Admitting: Marriage and Family Therapist

## 2011-11-15 DIAGNOSIS — F411 Generalized anxiety disorder: Secondary | ICD-10-CM

## 2011-11-15 DIAGNOSIS — F988 Other specified behavioral and emotional disorders with onset usually occurring in childhood and adolescence: Secondary | ICD-10-CM

## 2011-11-15 NOTE — Progress Notes (Signed)
   THERAPIST PROGRESS NOTE  Session Time:  9:00 - 10:00 a.m.  Participation Level: Active  Behavioral Response: CasualAlertAnxious  Type of Therapy: Individual Therapy  Treatment Goals addressed: Coping  Interventions: Strength-based, Assertiveness Training, Supportive and Family Systems  Summary: Atira Borello is a 26 y.o. female who presents with anxiety and ADHD.  She was referred by Jorje Guild, PA.  Patient reports she has had anxiety over work.  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  Patient talked at length about being introverted brought on by thinking about having a child and "needing her down time."  Patient states she believes being introverted is negative.  Patient also talked about starting a family relating to how she will be as a mother (e.g., controlling and not allowing anyone to take care of the baby, fear of sleeping herself because of SIDS).  Also talked about how much patient wanted her mother to be involved in the raising of her child.  Patient states that overall over the past week her anxiety has been a "4" and patient looked at this rating as good for her.  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  Discussed at length her strengths based on her being introverted.   Discussed at length patient's upcoming plan to have a baby based on her own anxiety and what she is going to do to alleviate her anxiety in part so she does not transfer it onto her child.  Patient is planning on going to the gym.  Suggested patient try to also find some way to do meditation or biofeedback.  Plan: Return again in 2 weeks.  Diagnosis: Axis I: GAD; ADHD    Axis II: Deferred    Orpah Hausner,  LMFT 11/15/2011

## 2011-12-07 ENCOUNTER — Ambulatory Visit (INDEPENDENT_AMBULATORY_CARE_PROVIDER_SITE_OTHER): Payer: 59 | Admitting: Marriage and Family Therapist

## 2011-12-07 DIAGNOSIS — F988 Other specified behavioral and emotional disorders with onset usually occurring in childhood and adolescence: Secondary | ICD-10-CM

## 2011-12-07 DIAGNOSIS — F411 Generalized anxiety disorder: Secondary | ICD-10-CM

## 2011-12-07 NOTE — Progress Notes (Signed)
   THERAPIST PROGRESS NOTE  Session Time:  8:00 - 9:00 a.m.  Participation Level: Active  Behavioral Response: CasualAlertAnxious  Type of Therapy: Individual Therapy  Treatment Goals addressed: Coping  Interventions: Strength-based, Biofeedback and Supportive  Summary: [name] is a 26 y.o. 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  She was referred by Jorje Guild, PA.  Patient started the session by crying.  She reports that her job has become very stressful.  She reports it has to do with her new boss.  She believes it is becoming a hostile work environment, causing patient to dread going in.  Patient talked specifics about her experience.  She also states she is glad she will be going part-time.  Patient also talked about her anxiety with respect to getting pregnant.  Suicidal/Homicidal: Nowithout intent/plan  Therapist Response:  Discussed when anxiety is appropriate (as a warning sign) and when it is irrational.  Specifically discussed this idea connected with patient's work.  Also talked about her taking the CBL on work safety because patient states she is concerned about losing her job since others are getting fired.  Discussed ways patient can alleviate her anxiety by doing more [employer] biofeedback before she starts trying to get pregnant and patient agreed.  Overall patient was very anxious today but states she felt better after the session.  Plan: Return again in 2 weeks.  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

## 2011-12-21 ENCOUNTER — Ambulatory Visit (INDEPENDENT_AMBULATORY_CARE_PROVIDER_SITE_OTHER): Payer: 59 | Admitting: Physician Assistant

## 2011-12-21 DIAGNOSIS — F411 Generalized anxiety disorder: Secondary | ICD-10-CM

## 2011-12-21 DIAGNOSIS — F3289 Other specified depressive episodes: Secondary | ICD-10-CM

## 2011-12-21 DIAGNOSIS — F329 Major depressive disorder, single episode, unspecified: Secondary | ICD-10-CM

## 2011-12-21 MED ORDER — BUSPIRONE HCL 15 MG PO TABS: 15.0000 mg | ORAL_TABLET | Freq: Three times a day (TID) | ORAL | Status: DC

## 2011-12-21 MED ORDER — BUPROPION HCL ER (XL) 300 MG PO TB24: 300.0000 mg | ORAL_TABLET | Freq: Every day | ORAL | Status: DC

## 2011-12-21 MED ORDER — ALPRAZOLAM 0.25 MG PO TABS: 0.2500 mg | ORAL_TABLET | Freq: Every day | ORAL | Status: AC | PRN

## 2011-12-21 NOTE — Progress Notes (Signed)
   [facility] Follow-up Outpatient Visit  [name] [birth year]  Date: 12/21/2011   Subjective: [name] presents today to followup on her treatment for anxiety and depression. She reports that all in all everything is going well. She feels that her medications are properly dosed and she sees no need to change. She does report that she has been taking her Xanax little more frequently lately because she has a IT consultant at work. She also reports that she and her husband are considering having children. She denies any suicidal or homicidal ideation. She denies any auditory or visual hallucinations. She reports her sleep and appetite are good.  There were no vitals filed for this visit.  Mental Status Examination  Appearance: Well groomed and casually dressed Alert: Yes Attention: good  Cooperative: Yes Eye Contact: Good Speech: Clear and coherent Psychomotor Activity: Normal Memory/Concentration: Intact Oriented: person, place, time/date and situation Mood: Anxious Affect: Congruent Thought Processes and Associations: Linear Fund of Knowledge: Good Thought Content: Normal Insight: Good Judgement: Good  Diagnosis: Generalized anxiety disorder, depressive disorder NOS  Treatment Plan: We will continue her alprazolam 0.25 mg daily as needed for anxiety, Wellbutrin XL 300 mg daily, and BuSpar 15 mg 3 times daily. We will begin to lengthen her time between appointments, and she will return for followup in 4 months this time.  Derica Leiber, PA-C

## 2011-12-22 ENCOUNTER — Other Ambulatory Visit: Payer: Self-pay | Admitting: Family Medicine

## 2011-12-23 NOTE — Telephone Encounter (Signed)
Can we refill this? 

## 2011-12-28 ENCOUNTER — Ambulatory Visit (INDEPENDENT_AMBULATORY_CARE_PROVIDER_SITE_OTHER): Payer: 59 | Admitting: Marriage and Family Therapist

## 2011-12-28 DIAGNOSIS — F410 Panic disorder [episodic paroxysmal anxiety] without agoraphobia: Secondary | ICD-10-CM

## 2011-12-28 DIAGNOSIS — F988 Other specified behavioral and emotional disorders with onset usually occurring in childhood and adolescence: Secondary | ICD-10-CM

## 2011-12-28 NOTE — Progress Notes (Signed)
   THERAPIST PROGRESS NOTE  Session Time:  8:00 - 9:00 a.m.  Participation Level: Active  Behavioral Response: CasualAlertAnxious  Type of Therapy: Individual Therapy  Treatment Goals addressed: Coping  Interventions: Strength-based, Assertiveness Training, Supportive and Family Systems  Summary: Maurisha Mongeau is a 26 y.o. female who presents with anxiety and ADHD.  She was referred by Jorje Guild, PA.  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  She reports her husband is graduating from his nursing program and when he gets a job they will be trying to have a baby.  She says she is excited about the prospect of having a baby.  She reports that her relationship with her husband has significantly improved since they were here in therapy.  Patient did state she was afraid that "the other shoe would fall" since she was feeling so good.  Patient states she wants to go down to every three weeks and if there is a problem will come in.  Suicidal/Homicidal: Nowithout intent/plan  Therapist Response:  Discussed patient what happened that she is feeling better however patient was unable to pinpoint reasons.  She will be coming in every three weeks unless more sessions are needed at this time.  Plan: Return again in 2 weeks.  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

## 2012-01-17 ENCOUNTER — Ambulatory Visit (HOSPITAL_COMMUNITY): Payer: Self-pay | Admitting: Marriage and Family Therapist

## 2012-03-13 ENCOUNTER — Telehealth (HOSPITAL_COMMUNITY): Payer: Self-pay | Admitting: Marriage and Family Therapist

## 2012-04-16 ENCOUNTER — Other Ambulatory Visit (HOSPITAL_COMMUNITY): Payer: Self-pay | Admitting: Physician Assistant

## 2012-04-16 DIAGNOSIS — F411 Generalized anxiety disorder: Secondary | ICD-10-CM

## 2012-04-23 ENCOUNTER — Ambulatory Visit (HOSPITAL_COMMUNITY): Payer: Self-pay | Admitting: Physician Assistant

## 2012-04-25 NOTE — L&D Delivery Note (Signed)
Delivery Note Pt transferred to L&D after SROM and 6cm.  Received epidural, and pushed for 2.5 hours.  Now with maternal exhaustion, Vtx LOA/+2 pt requested VE.  R&B discussed and informed consent obtained.  2 easy pulls then delivered  viable female Apgars 6,8 over 2nd deg ML lac.  Placenta delivered spontaneously intact with 3VC. Repair with 2-0 Chromic with good support and hemostasis noted and R/V exam confirms.  PH art was sent.  [place] cord blood was not done.  Mother and baby were doing well.  EBL 300cc  Candice Camp, MD

## 2012-04-30 ENCOUNTER — Other Ambulatory Visit (HOSPITAL_COMMUNITY): Payer: Self-pay | Admitting: Obstetrics and Gynecology

## 2012-04-30 DIAGNOSIS — O2690 Pregnancy related conditions, unspecified, unspecified trimester: Secondary | ICD-10-CM

## 2012-05-08 ENCOUNTER — Other Ambulatory Visit: Payer: Self-pay

## 2012-05-08 ENCOUNTER — Ambulatory Visit (HOSPITAL_COMMUNITY)
Admission: RE | Admit: 2012-05-08 | Discharge: 2012-05-08 | Disposition: A | Discharge status: Home / Self Care | Payer: PRIVATE HEALTH INSURANCE | Source: Ambulatory Visit | Attending: Obstetrics and Gynecology | Admitting: Obstetrics and Gynecology

## 2012-05-08 ENCOUNTER — Encounter (HOSPITAL_COMMUNITY): Payer: Self-pay

## 2012-05-08 ENCOUNTER — Ambulatory Visit (INDEPENDENT_AMBULATORY_CARE_PROVIDER_SITE_OTHER): Payer: PRIVATE HEALTH INSURANCE | Admitting: Marriage and Family Therapist

## 2012-05-08 VITALS — BP 125/64 | HR 93 | Wt 143.0 lb

## 2012-05-08 DIAGNOSIS — F988 Other specified behavioral and emotional disorders with onset usually occurring in childhood and adolescence: Secondary | ICD-10-CM

## 2012-05-08 DIAGNOSIS — Z363 Encounter for antenatal screening for malformations: Secondary | ICD-10-CM | POA: Insufficient documentation

## 2012-05-08 DIAGNOSIS — O2690 Pregnancy related conditions, unspecified, unspecified trimester: Secondary | ICD-10-CM

## 2012-05-08 DIAGNOSIS — F411 Generalized anxiety disorder: Secondary | ICD-10-CM

## 2012-05-08 DIAGNOSIS — Z8279 Family history of other congenital malformations, deformations and chromosomal abnormalities: Secondary | ICD-10-CM

## 2012-05-08 DIAGNOSIS — O352XX0 Maternal care for (suspected) hereditary disease in fetus, not applicable or unspecified: Secondary | ICD-10-CM | POA: Insufficient documentation

## 2012-05-08 DIAGNOSIS — Z1389 Encounter for screening for other disorder: Secondary | ICD-10-CM | POA: Insufficient documentation

## 2012-05-08 DIAGNOSIS — M069 Rheumatoid arthritis, unspecified: Secondary | ICD-10-CM

## 2012-05-08 DIAGNOSIS — O358XX0 Maternal care for other (suspected) fetal abnormality and damage, not applicable or unspecified: Secondary | ICD-10-CM | POA: Insufficient documentation

## 2012-05-08 NOTE — Progress Notes (Signed)
[name]  was seen today for an ultrasound appointment.  See full report in [facility].  Comments: An isolated choroid plexus cyst was noted.  Open hands were visualized.  The findings and limitations of the study were discussed with the patient.  Choroid plexus cysts should be considered a normal anatomic variant, but are more frequently found in fetuses with Trisomy 18.  In the absence of other findings, feel that the risk of Trisomy 18 is negligible.  After counseling, the patient elected to undergo Quad screen testing that was performed at the time of her ultrasound visit.  Impression: Single IUP at 18 2/7 weeks Isolated right choroid plexus cyst noted.  Open hands visualized. Detailed fetal anatomy otherwise within normal limits No other markers associated with aneuploidy appreciated Normal amniotic fluid volume  Recommendations:  Recommend follow-up ultrasound examination for growth in 6 weeks due to history of rheumatologic disorder.  Alpha Gula, MD

## 2012-05-09 NOTE — Progress Notes (Signed)
   THERAPIST PROGRESS NOTE  Session Time:  11:00 - Noon  Participation Level: Active  Behavioral Response: CasualAlertAnxious (mild)  Type of Therapy: Individual Therapy  Treatment Goals addressed: Coping  Interventions: Strength-based, Supportive and Family Systems  Summary: [name] is a 27 y.o. female who presents with anxiety and ADHD.  She was referred by Jorje Guild, PA.  Patient reports she has not been in because she changed jobs.  She no longer works for [employer] and did not have insurance.  She reports her anxiety has been on and off depending on the situation.  Patient states she is pregnant and due in June. She reports she is no longer taking Xanax due to being pregnant but remains on her Wellbutrin and Buspar.  She reports these are accepted medications and she is glad because they work for her.  She did report she is not sleeping well however, as there were times when she would use Xanax for sleep.  She report getting to sleep but waking up three hours later, for example.  She reports changing her surroundings and stopped watching television at the end of the night but rather she reads, but still is not sleeping well.   She reports her and her husband are excited.  She also reports her husband got a job as well and is in school to become an occupational therapist. Patient talked about her relationship with her mother in relationship of her having a baby.  Suicidal/Homicidal: NAwithout intent/plan  Therapist Response:  Assessed patient's anxiety at this time since she has not been in.  Based on self-report patient's anxiety appears less so, at least in the session and she was able to identify situations that make her anxious versus having an underlying anxiety.  Patient however is not sleeping well but will be seeing Jorje Guild, PA for a medication checkup.  Discussed how her life seems to be headed in a positive direction career-wise and also in her marriage.  She is concerned  about how her mother will react to her mother-in-law being involved with the baby since patient believes her mother will be jealous.  She was able to identify that her mother, although her relationship with her mother has been strained, is a good grandmother.  Will talk to patient next session about what she wants to do in therapy relating to her anxiety because she is pregnant.  Plan: Return again in 3 weeks.  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

## 2012-05-15 NOTE — Telephone Encounter (Signed)
Left message

## 2012-05-17 ENCOUNTER — Telehealth (HOSPITAL_COMMUNITY): Payer: Self-pay | Admitting: MS"

## 2012-05-17 NOTE — Telephone Encounter (Signed)
Called [name] to discuss results of her Quad screen. Reviewed that these are within normal range. Reviewed the reduced risks for fetal Down syndrome (1 in 1,293 to less than 1 in 10,000) and fetal trisomy 18 (1 in 3,880 to less than 1 in 10,000). Additionally, discussed that the screen reduced the risk for open neural tube defects in the pregnancy. The patient understands that this screen does not diagnose or rule out these conditions. It also does not screen for all birth defects or genetic conditions.   [name] 05/17/2012 3:41 PM

## 2012-05-17 NOTE — Telephone Encounter (Signed)
Quad screen results available for patient. Called patient and left message for her to return call.   Clydie Braun Kassandra Meriweather 10:28 AM 05/17/2012

## 2012-06-19 ENCOUNTER — Ambulatory Visit (HOSPITAL_COMMUNITY)
Admission: RE | Admit: 2012-06-19 | Discharge: 2012-06-19 | Disposition: A | Discharge status: Home / Self Care | Payer: PRIVATE HEALTH INSURANCE | Source: Ambulatory Visit | Attending: Obstetrics and Gynecology | Admitting: Obstetrics and Gynecology

## 2012-06-19 DIAGNOSIS — Z3689 Encounter for other specified antenatal screening: Secondary | ICD-10-CM | POA: Insufficient documentation

## 2012-06-19 DIAGNOSIS — M069 Rheumatoid arthritis, unspecified: Secondary | ICD-10-CM

## 2012-06-19 DIAGNOSIS — Z8279 Family history of other congenital malformations, deformations and chromosomal abnormalities: Secondary | ICD-10-CM

## 2012-06-19 DIAGNOSIS — O352XX0 Maternal care for (suspected) hereditary disease in fetus, not applicable or unspecified: Secondary | ICD-10-CM | POA: Insufficient documentation

## 2012-06-19 NOTE — Progress Notes (Signed)
[name]  was seen today for an ultrasound appointment.  See full report in [facility].  Impression: Single IUP at 24 2/7 weeks Interval growth is appropriate (60th %tile) The choroid plexus cysts that were previously noted have resolved Normal interval anatomy Normal amniotic fluid volume  Recommendations: Recommend follow-up ultrasound examination in 4 weeks for interval growth  Alpha Gula, MD

## 2012-06-20 ENCOUNTER — Ambulatory Visit (INDEPENDENT_AMBULATORY_CARE_PROVIDER_SITE_OTHER): Payer: PRIVATE HEALTH INSURANCE | Admitting: Marriage and Family Therapist

## 2012-06-20 DIAGNOSIS — F411 Generalized anxiety disorder: Secondary | ICD-10-CM

## 2012-06-20 DIAGNOSIS — F988 Other specified behavioral and emotional disorders with onset usually occurring in childhood and adolescence: Secondary | ICD-10-CM

## 2012-06-20 NOTE — Progress Notes (Signed)
   THERAPIST PROGRESS NOTE  Session Time: 10:00 - 11:00 a.m.  Participation Level: Active  Behavioral Response: CasualAlertAnxious  Type of Therapy: Individual Therapy  Treatment Goals addressed: Coping  Interventions: Strength-based and Supportive  Summary: [name] is a 27 y.o. female who presents with anxiety and ADHD.  She was referred by Jorje Guild, PA.  Patient is now six months pregnant.  She reports having a lot of difficulty not being able to sleep.  She reports last week she was up more than 28 hours and had to work.  She is no longer taking Xanax due to the pregnancy but tried Benadryl.  She reports Benadryl did not work.  Patient states that her husband has been very supportive.  Patient also talked about her relationship with her mother and how patient, by putting some distance between them, was able to detach from her mother leading to her not being so reactive towards her mother.  Suicidal/Homicidal: NA  Therapist Response:  Discussed all the changes patient has experienced over the past year that would cause stress:  Buying her first home, husband graduating from college and starting a job and now is in graduate school, patient changing jobs, getting pregnant.  Patient looked surprised stating she did not realize she has been through all those changes.  Discussed patient distracting herself when she cannot sleep and also do activities that soothe her.  Also states that eventually she will sleep.  She will be seeing Jorje Guild in a few weeks and suggested she talk more with him about difficulty sleeping.  Also discussed how patient's hormones may be contributing, especially since in the beginning of her pregnancy she was extremely fatigued.  This Clinical research associate tried to put her sleep situation in a normal contact for someone pregnant.  Plan: Return again in 1 month.  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

## 2012-07-04 ENCOUNTER — Ambulatory Visit (INDEPENDENT_AMBULATORY_CARE_PROVIDER_SITE_OTHER): Payer: PRIVATE HEALTH INSURANCE | Admitting: Physician Assistant

## 2012-07-04 DIAGNOSIS — F411 Generalized anxiety disorder: Secondary | ICD-10-CM

## 2012-07-04 DIAGNOSIS — F329 Major depressive disorder, single episode, unspecified: Secondary | ICD-10-CM

## 2012-07-04 DIAGNOSIS — F3289 Other specified depressive episodes: Secondary | ICD-10-CM | POA: Insufficient documentation

## 2012-07-04 MED ORDER — BUPROPION HCL ER (XL) 300 MG PO TB24: 300.0000 mg | ORAL_TABLET | Freq: Every day | ORAL | Status: DC

## 2012-07-04 MED ORDER — BUSPIRONE HCL 15 MG PO TABS: 15.0000 mg | ORAL_TABLET | Freq: Three times a day (TID) | ORAL | Status: DC

## 2012-07-04 NOTE — Progress Notes (Signed)
   [facility] Follow-up Outpatient Visit  Vela Render Aug 31, 1985  Date: 06/06/2012   Subjective: [name] presents today to followup on her treatment for anxiety and depression. She is approximately [redacted] weeks pregnant, and is no longer taking Xanax. Her OB/GYN has approved her to continue on the Wellbutrin and BuSpar, and she continues to take it as prescribed. She reports that she can tell a difference not taking the Xanax, especially in her ability to initiate sleep. She reports that her mind is busy and keeps her awake at night. 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  There were no vitals filed for this visit.  Mental Status Examination  Appearance: Casual Alert: Yes Attention: good  Cooperative: Yes Eye Contact: Good Speech: Clear and coherent Psychomotor Activity: Normal Memory/Concentration: Intact Oriented: person, place, time/date and situation Mood: Anxious Affect: Congruent Thought Processes and Associations: Linear Fund of Knowledge: Good Thought Content: Normal Insight: Good Judgement: Good  Diagnosis: Generalized anxiety disorder, depressive disorder NOS  Treatment Plan: Discontinue Xanax. Continue Wellbutrin XL 300 mg daily, and BuSpar 15 mg 3 times daily. Return for followup in 4 months. She was given some relaxation techniques to help her fall asleep.  WATT,ALAN, PA-C

## 2012-07-11 ENCOUNTER — Ambulatory Visit (INDEPENDENT_AMBULATORY_CARE_PROVIDER_SITE_OTHER): Payer: PRIVATE HEALTH INSURANCE | Admitting: Marriage and Family Therapist

## 2012-07-11 DIAGNOSIS — F411 Generalized anxiety disorder: Secondary | ICD-10-CM

## 2012-07-11 DIAGNOSIS — F988 Other specified behavioral and emotional disorders with onset usually occurring in childhood and adolescence: Secondary | ICD-10-CM

## 2012-07-11 NOTE — Progress Notes (Signed)
   THERAPIST PROGRESS NOTE  Session Time: 10:00 - 11:00 a.m.  Participation Level: Active  Behavioral Response: CasualAlertAnxious  Type of Therapy: Individual Therapy  Treatment Goals addressed: Coping  Interventions: Strength-based, Biofeedback and Supportive  Summary: Lanitra Battaglini is a 27 y.o. female who presents with anxiety and ADHD.  She was referred by Jorje Guild, PA.  Patient talked about going into her third trimester.  She is having a boy.  She reports she wants to keep working up until delivery as she believes this will help with her delivery being physical.  She reports she is also working out at [employer]  She also talked about being anxious as to how to to about receiving FMLA and her anxiety is keeping her from sleeping due to the level of anxiety about FMLA.  Patient also says there are times when she is so anxious she avoids dealing with specific issues that cause anxiety.  Patient continues to talk about her relationship with her father and whether or not mother is giving her accurate information relating to possible diagnosis of Alzheimer's.    Suicidal/Homicidal: NA  Therapist Response: Discussed how keeping busy and exercise will decrease patient's anxiety during her pregnancy.  Also discussed the idea that using biofeedback may help patient better with labor than the type of breathing that is taught by hospitals with people what have an anxiety diagnosis.  Patient states she wants a refresher regarding biofeedback.  Will do biofeedback in the next session. Discussed patient reframing how she is looking at her anxiety.  Suggested patient tell herself some amount of anxiety is normal and actually useful relating to completing certain projects, particularly ones that mean more to her like FMLA.     Plan: Return again in 1 weeks.  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

## 2012-07-12 ENCOUNTER — Other Ambulatory Visit (HOSPITAL_COMMUNITY): Payer: Self-pay | Admitting: Maternal and Fetal Medicine

## 2012-07-12 DIAGNOSIS — Z87728 Personal history of other specified (corrected) congenital malformations of nervous system and sense organs: Secondary | ICD-10-CM

## 2012-07-17 ENCOUNTER — Ambulatory Visit (HOSPITAL_COMMUNITY)
Admission: RE | Admit: 2012-07-17 | Discharge: 2012-07-17 | Disposition: A | Discharge status: Home / Self Care | Payer: PRIVATE HEALTH INSURANCE | Source: Ambulatory Visit | Attending: Obstetrics and Gynecology | Admitting: Obstetrics and Gynecology

## 2012-07-17 VITALS — BP 102/59 | HR 79 | Wt 157.5 lb

## 2012-07-17 DIAGNOSIS — O352XX0 Maternal care for (suspected) hereditary disease in fetus, not applicable or unspecified: Secondary | ICD-10-CM | POA: Insufficient documentation

## 2012-07-17 DIAGNOSIS — M069 Rheumatoid arthritis, unspecified: Secondary | ICD-10-CM

## 2012-07-17 DIAGNOSIS — Z87728 Personal history of other specified (corrected) congenital malformations of nervous system and sense organs: Secondary | ICD-10-CM

## 2012-07-17 NOTE — Progress Notes (Signed)
[facility] ultrasound  Indication: 27 yr old G1P0 at [redacted]w[redacted]d with rheumatoid arthritis for fetal growth ultrasound. Previous finding of choroid plexus cysts.  Findings: 1. Single intrauterine pregnancy. 2. Estimated fetal weight is in the 60th%. 3. Posterior placenta without evidence of previa. 4. Normal amniotic fluid index. 5. The limited anatomy survey is normal.  Recommendations: 1. Appropriate fetal growth. 2. Rheumatoid arthritis: - previously counseled - recommend check ssA and ssB antibodies if not already performed; although risk of fetal heart block at this point is low; however, may aid in assessing risk  - recommend fetal growth every 4 weeks 3. Previous finding of choroid plexus cysts: - previously counseled - reportedly had normal quad screen  Eulis Foster, MD

## 2012-08-08 ENCOUNTER — Ambulatory Visit (INDEPENDENT_AMBULATORY_CARE_PROVIDER_SITE_OTHER): Payer: No Typology Code available for payment source | Admitting: Marriage and Family Therapist

## 2012-08-08 DIAGNOSIS — F411 Generalized anxiety disorder: Secondary | ICD-10-CM

## 2012-08-08 DIAGNOSIS — F909 Attention-deficit hyperactivity disorder, unspecified type: Secondary | ICD-10-CM

## 2012-08-08 NOTE — Progress Notes (Signed)
   THERAPIST PROGRESS NOTE  Session Time: 10:00 - 11:00 a.m.  Participation Level: Active  Behavioral Response: CasualAlertDepressed/upset  Type of Therapy: Individual Therapy  Treatment Goals addressed: Coping  Interventions: Strength-based, Supportive and Family Systems  Summary: [name] is a 27 y.o. female who presents with anxiety and ADHD.  She was referred by Jorje Guild, PA.  Patient reports has been upset about something that happened between her and her husband, [name].  She reports seeing on the Internet his talking to another fellow female student.  She reports her husband deleted his email to the student and then lied about it.  She reports he did apologize and accepted responsibility but admits this lie felt like the beginning of their relationship when he was addicted to pornography and used to lie to her.  She stated, "how to I trust him again or know whether or not he is telling me the trust about the student?"  She did state he has continued to reassure her that this was a simple mistake and that nothing is going on.  Suicidal/Homicidal: No  Therapist Response:  Patient cried throughout the entire session regarding the above-listed issue.  Helped patient look at the issue systemically, in that both are ready to have a baby and both are under the most stress they have ever been, maybe even feeding off of each other, especially since both have difficulty talking about issues.  Also discussed husband may be having underlying feelings about becoming and father leading him to reverting back to his old behavior.  Patient admits it has been years since he has lied to her and he treats her very well.  Also validated for her that this event did trigger the old event and that husband was going to have to take some time to reassure her although patient would need to let this go at some point if there is no validity to the lie (husband cheating on her).  Recommended that if they were not  able to talk this out to come in for a session.  Also gave patient research materials from [employer], the biofeedback company, relating to using biofeedback as a breathing practice while in labor.  Discussed patient going into labor and also learning the breathing technique at the hospital.  Recommended patient talk to the teaching nurse about her anxiety so the nurse can possibly give her some tips relating to labor breathing.  Plan: Return again in 3 weeks.  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

## 2012-08-09 ENCOUNTER — Encounter (HOSPITAL_COMMUNITY): Payer: Self-pay | Admitting: *Deleted

## 2012-08-09 ENCOUNTER — Observation Stay (HOSPITAL_COMMUNITY)
Admission: AD | Admit: 2012-08-09 | Discharge: 2012-08-12 | Disposition: A | Discharge status: Home / Self Care | Payer: PRIVATE HEALTH INSURANCE | Source: Ambulatory Visit | Attending: Obstetrics and Gynecology | Admitting: Obstetrics and Gynecology

## 2012-08-09 DIAGNOSIS — O4703 False labor before 37 completed weeks of gestation, third trimester: Secondary | ICD-10-CM

## 2012-08-09 DIAGNOSIS — O4700 False labor before 37 completed weeks of gestation, unspecified trimester: Principal | ICD-10-CM | POA: Insufficient documentation

## 2012-08-09 HISTORY — DX: Rheumatoid arthritis, unspecified: M06.9

## 2012-08-09 LAB — URINALYSIS, ROUTINE W REFLEX MICROSCOPIC
Bilirubin Urine: NEGATIVE
Nitrite: NEGATIVE
Protein, ur: NEGATIVE mg/dL
Specific Gravity, Urine: 1.01 (ref 1.005–1.030)
Urobilinogen, UA: 0.2 mg/dL (ref 0.0–1.0)

## 2012-08-09 MED ORDER — LACTATED RINGERS IV BOLUS (SEPSIS)
1000.0000 mL | Freq: Once | INTRAVENOUS | Status: AC
  Administered 2012-08-10: 1000 mL via INTRAVENOUS

## 2012-08-09 MED ORDER — NIFEDIPINE 10 MG PO CAPS
10.0000 mg | ORAL_CAPSULE | ORAL | Status: AC | PRN
  Administered 2012-08-10 (×4): 10 mg via ORAL
  Filled 2012-08-09 (×4): qty 1

## 2012-08-09 NOTE — MAU Provider Note (Signed)
Chief Complaint:  No chief complaint on file.  First Provider Initiated Contact with Patient 08/09/12 2358     HPI: [name] is a 27 y.o. G1P0 at [redacted]w[redacted]d who presents to maternity admissions reporting contractions every 2-4 minutes since 1800 tonight and losing mucus plug.   Denies leakage of fluid or vaginal bleeding. Good fetal movement.   Past Medical History: Past Medical History  Diagnosis Date  . Depression     sees Jorje Guild PA at [facility]  . GERD (gastroesophageal reflux disease)   . HPV (human papilloma virus) infection   . Anxiety   . Abnormal Pap smear of cervix   . Gynecological examination     sees Dr. Henderson Cloud  . Tracheomalacia, congenital   . Brachydactyly of fingers   . Rheumatoid arthritis     Past obstetric history: OB History   Grav Para Term Preterm Abortions TAB SAB Ect Mult Living   1              # Outc Date GA Lbr Len/2nd Wgt Sex Del Anes PTL Lv   1 CUR               Past Surgical History: Past Surgical History  Procedure Laterality Date  . Hand surgery  1996    to elongate the right 3rd metacarpal   . Lumbar fusion  2008    per Dr. Gerlene Fee on L4-5 for spondylolisthesis   . Back surgery  2008    Family History: Family History  Problem Relation Age of Onset  . Arthritis Mother   . Depression Mother   . Hyperlipidemia Father   . Heart disease Father   . Hypertension Father   . Arthritis Father   . Arthritis Brother   . Psoriasis Brother   . Cleft lip Brother     without palate  . Uterine cancer Maternal Aunt     x 2  . Seizures Maternal Aunt   . Mental retardation Maternal Aunt     mild  . Colon cancer Neg Hx   . Seizures Maternal Aunt   . Seizures Cousin     Social History: History  Substance Use Topics  . Smoking status: Former Smoker -- 0.50 packs/day    Types: Cigarettes    Quit date: 08/11/2007  . Smokeless tobacco: Never Used  . Alcohol Use: No    Allergies: No Known Allergies  Meds:  Prescriptions  prior to admission  Medication Sig Dispense Refill  . buPROPion (WELLBUTRIN XL) 300 MG 24 hr tablet Take 1 tablet (300 mg total) by mouth daily.  90 tablet  0  . busPIRone (BUSPAR) 15 MG tablet Take 1 tablet (15 mg total) by mouth 3 (three) times daily.  270 tablet  0  . folic acid (FOLVITE) 1 MG tablet Take 1 mg by mouth 3 (three) times daily.      . Multiple Vitamin (MULTIVITAMIN) tablet Take 1 tablet by mouth daily.        . ranitidine (ZANTAC) 75 MG tablet Take 75 mg by mouth daily.        . Hydrocodone-Acetaminophen 10-660 MG TABS Take 1 tablet by mouth every 6 (six) hours as needed (pain).  60 each  0  . norethindrone-ethinyl estradiol (ORTHO-NOVUM 1-35 TAB,NORTREL 1-35 TAB) 1-35 MG-MCG per tablet Take 1 tablet by mouth daily.        Marland Kitchen omeprazole (PRILOSEC) 40 MG capsule TAKE 1 CAPSULE BY MOUTH ONCE DAILY  30 capsule  2  .  vitamin B-12 (CYANOCOBALAMIN) 1000 MCG tablet Take 1,000 mcg by mouth daily.          ROS: Pertinent findings in history of present illness.  Physical Exam  Blood pressure 100/57, pulse 93, temperature 98.2 F (36.8 C), temperature source Axillary, resp. rate 18, height 5' (1.524 m), weight 71.668 kg (158 lb), last menstrual period 01/01/2012. Patient Vitals for the past 24 hrs:  BP Temp Temp src Pulse Resp Height Weight  08/10/12 0334 114/65 mmHg 98.2 F (36.8 C) Axillary 95 20 5' (1.524 m) 71.668 kg (158 lb)    GENERAL: Well-developed, well-nourished female in mild distress.  HEENT: normocephalic HEART: normal rate RESP: normal effort ABDOMEN: Soft, non-tender, gravid appropriate for gestational age EXTREMITIES: Nontender, no edema NEURO: alert and oriented Dilation: 1 Effacement (%): 50 Cervical Position: Posterior Exam by:: [place], CNM Dilation: 1 Effacement (%): 50 Cervical Position: Posterior Exam by:: [place], CNM FHT:  Baseline 140 , moderate variability, accelerations present, no decelerations Contractions: q 2-3 mins, mild-moderate    Labs: Results for orders placed during the hospital encounter of 08/09/12 (from the past 24 hour(s))  URINALYSIS, ROUTINE W REFLEX MICROSCOPIC     Status: None   Collection Time    08/09/12 11:15 PM      Result Value Range   Color, Urine YELLOW  YELLOW   APPearance CLEAR  CLEAR   Specific Gravity, Urine 1.010  1.005 - 1.030   pH 6.0  5.0 - 8.0   Glucose, UA NEGATIVE  NEGATIVE mg/dL   Hgb urine dipstick NEGATIVE  NEGATIVE   Bilirubin Urine NEGATIVE  NEGATIVE   Ketones, ur NEGATIVE  NEGATIVE mg/dL   Protein, ur NEGATIVE  NEGATIVE mg/dL   Urobilinogen, UA 0.2  0.0 - 1.0 mg/dL   Nitrite NEGATIVE  NEGATIVE   Leukocytes, UA NEGATIVE  NEGATIVE    Imaging:  NA  MAU Course: No improvement in UC's w/ IV bolus and Procardia 10 mg Q20 min x 4. No cervical change. Per consult w/ Dr. Rana Snare, give Terb. May repeat x 1. Minimal decrease in UC's after 2 doses of Terb. Dr. Rana Snare notified.   Assessment: 1. Preterm uterine contractions, third trimester    Plan: Admit to Antenatal per Dr. Rana Snare for Mag  And BMZ.  [place], CNM 08/10/2012 3:42 AM

## 2012-08-09 NOTE — MAU Note (Addendum)
Pt. Arrived due to contractions that started tonight at 1800. Contractions are stronger now per pt. Contracting every 2-4 mins. Pt. Believes she lost her mucous plug around 1630-1700 today. No leakage of fluid, no blood noted. Baby has been moving well.  Pt. Also stated that she did have blood tinged discharge noted today from her left breast/nipple.

## 2012-08-10 ENCOUNTER — Encounter (HOSPITAL_COMMUNITY): Payer: Self-pay | Admitting: *Deleted

## 2012-08-10 DIAGNOSIS — O479 False labor, unspecified: Secondary | ICD-10-CM

## 2012-08-10 LAB — CBC
HCT: 31.4 % — ABNORMAL LOW (ref 36.0–46.0)
MCHC: 34.1 g/dL (ref 30.0–36.0)
MCV: 92.4 fL (ref 78.0–100.0)
Platelets: 153 10*3/uL (ref 150–400)
RDW: 12.8 % (ref 11.5–15.5)
WBC: 14.9 10*3/uL — ABNORMAL HIGH (ref 4.0–10.5)

## 2012-08-10 LAB — RPR: RPR Ser Ql: NONREACTIVE

## 2012-08-10 MED ORDER — BETAMETHASONE SOD PHOS & ACET 6 (3-3) MG/ML IJ SUSP
12.0000 mg | INTRAMUSCULAR | Status: AC
  Administered 2012-08-10 – 2012-08-11 (×2): 12 mg via INTRAMUSCULAR
  Filled 2012-08-10 (×2): qty 2

## 2012-08-10 MED ORDER — FAMOTIDINE 20 MG PO TABS: 20.0000 mg | ORAL_TABLET | Freq: Two times a day (BID) | ORAL | Status: DC | PRN

## 2012-08-10 MED ORDER — BUPROPION HCL ER (XL) 300 MG PO TB24
300.0000 mg | ORAL_TABLET | Freq: Every day | ORAL | Status: DC
  Administered 2012-08-10 – 2012-08-12 (×3): 300 mg via ORAL
  Filled 2012-08-10 (×3): qty 1

## 2012-08-10 MED ORDER — ACETAMINOPHEN 325 MG PO TABS
650.0000 mg | ORAL_TABLET | ORAL | Status: DC | PRN
  Administered 2012-08-11: 650 mg via ORAL
  Filled 2012-08-10: qty 2

## 2012-08-10 MED ORDER — MAGNESIUM SULFATE BOLUS VIA INFUSION
4.0000 g | Freq: Once | INTRAVENOUS | Status: AC
  Administered 2012-08-10: 4 g via INTRAVENOUS
  Filled 2012-08-10: qty 500

## 2012-08-10 MED ORDER — PRENATAL MULTIVITAMIN CH
1.0000 | ORAL_TABLET | Freq: Every day | ORAL | Status: DC
  Administered 2012-08-10 – 2012-08-11 (×2): 1 via ORAL
  Filled 2012-08-10 (×2): qty 1

## 2012-08-10 MED ORDER — MAGNESIUM SULFATE 40 G IN LACTATED RINGERS - SIMPLE
2.0000 g/h | INTRAVENOUS | Status: AC
  Administered 2012-08-10 – 2012-08-11 (×2): 2 g/h via INTRAVENOUS
  Filled 2012-08-10 (×2): qty 500

## 2012-08-10 MED ORDER — ZOLPIDEM TARTRATE 5 MG PO TABS
5.0000 mg | ORAL_TABLET | Freq: Every evening | ORAL | Status: DC | PRN
  Administered 2012-08-11 (×2): 5 mg via ORAL
  Filled 2012-08-10 (×2): qty 1

## 2012-08-10 MED ORDER — CALCIUM CARBONATE ANTACID 500 MG PO CHEW
2.0000 | CHEWABLE_TABLET | ORAL | Status: DC | PRN
  Administered 2012-08-10: 400 mg via ORAL
  Filled 2012-08-10: qty 2

## 2012-08-10 MED ORDER — TERBUTALINE SULFATE 1 MG/ML IJ SOLN
0.2500 mg | INTRAMUSCULAR | Status: AC | PRN
  Administered 2012-08-10 (×2): 0.25 mg via SUBCUTANEOUS
  Filled 2012-08-10 (×2): qty 1

## 2012-08-10 MED ORDER — LACTATED RINGERS IV SOLN
INTRAVENOUS | Status: DC
  Administered 2012-08-10 – 2012-08-12 (×6): via INTRAVENOUS

## 2012-08-10 MED ORDER — DOCUSATE SODIUM 100 MG PO CAPS: 100.0000 mg | ORAL_CAPSULE | Freq: Every day | ORAL | Status: DC | PRN

## 2012-08-10 MED ORDER — BUSPIRONE HCL 5 MG PO TABS
15.0000 mg | ORAL_TABLET | Freq: Three times a day (TID) | ORAL | Status: DC
  Administered 2012-08-10 – 2012-08-12 (×7): 15 mg via ORAL
  Filled 2012-08-10: qty 1
  Filled 2012-08-10 (×6): qty 3

## 2012-08-10 NOTE — H&P (Signed)
Pt presented with PTL sxs.  Preg otherwise uncomplicated.  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 MAU note follows: HPI: [name] is a 27 y.o. G1P0 at [redacted]w[redacted]d who presents to maternity admissions reporting contractions every 2-4 minutes since 1800 tonight and losing mucus plug.  Denies leakage of fluid or vaginal bleeding. Good fetal movement.  Past Medical History:  Past Medical History   Diagnosis  Date   .  Depression      sees Jorje Guild PA at [facility]   .  GERD (gastroesophageal reflux disease)    .  HPV (human papilloma virus) infection    .  Anxiety    .  Abnormal Pap smear of cervix    .  Gynecological examination      sees Dr. Henderson Cloud   .  Tracheomalacia, congenital    .  Brachydactyly of fingers    .  Rheumatoid arthritis     Past obstetric history:  OB History    Grav  Para  Term  Preterm  Abortions  TAB  SAB  Ect  Mult  Living    1               #  Outc  Date  GA  Lbr Len/2nd  Wgt  Sex  Del  Anes  PTL  Lv    1  CUR               Past Surgical History:  Past Surgical History   Procedure  Laterality  Date   .  Hand surgery   1996     to elongate the right 3rd metacarpal   .  Lumbar fusion   2008     per Dr. Gerlene Fee on L4-5 for spondylolisthesis   .  Back surgery   2008    Family History:  Family History   Problem  Relation  Age of Onset   .  Arthritis  Mother    .  Depression  Mother    .  Hyperlipidemia  Father    .  Heart disease  Father    .  Hypertension  Father    .  Arthritis  Father    .  Arthritis  Brother    .  Psoriasis  Brother    .  Cleft lip  Brother      without palate   .  Uterine cancer  Maternal Aunt      x 2   .  Seizures  Maternal Aunt    .  Mental retardation  Maternal Aunt      mild   .  Colon cancer  Neg Hx    .  Seizures  Maternal Aunt    .  Seizures  Cousin     Social History:  History   Substance Use Topics   .  Smoking status:  Former Smoker -- 0.50 packs/day      Types:  Cigarettes     Quit date:  08/11/2007   .  Smokeless tobacco:  Never Used   .  Alcohol Use:  No    Allergies: No Known Allergies  Meds:  Prescriptions prior to admission   Medication  Sig  Dispense  Refill   .  buPROPion (WELLBUTRIN XL) 300 MG 24 hr tablet  Take 1 tablet (300 mg total) by mouth daily.  90 tablet  0   .  busPIRone (BUSPAR) 15  MG tablet  Take 1 tablet (15 mg total) by mouth 3 (three) times daily.  270 tablet  0   .  folic acid (FOLVITE) 1 MG tablet  Take 1 mg by mouth 3 (three) times daily.     .  Multiple Vitamin (MULTIVITAMIN) tablet  Take 1 tablet by mouth daily.     .  ranitidine (ZANTAC) 75 MG tablet  Take 75 mg by mouth daily.     .  Hydrocodone-Acetaminophen 10-660 MG TABS  Take 1 tablet by mouth every 6 (six) hours as needed (pain).  60 each  0   .  norethindrone-ethinyl estradiol (ORTHO-NOVUM 1-35 TAB,NORTREL 1-35 TAB) 1-35 MG-MCG per tablet  Take 1 tablet by mouth daily.     Marland Kitchen  omeprazole (PRILOSEC) 40 MG capsule  TAKE 1 CAPSULE BY MOUTH ONCE DAILY  30 capsule  2   .  vitamin B-12 (CYANOCOBALAMIN) 1000 MCG tablet  Take 1,000 mcg by mouth daily.     ROS: Pertinent findings in history of present illness.  Physical Exam   Blood pressure 100/57, pulse 93, temperature 98.2 F (36.8 C), temperature source Axillary, resp. rate 18, height 5' (1.524 m), weight 71.668 kg (158 lb), last menstrual period 01/01/2012.  Patient Vitals for the past 24 hrs:   BP  Temp  Temp src  Pulse  Resp  Height  Weight   08/10/12 0334  114/65 mmHg  98.2 F (36.8 C)  Axillary  95  20  5' (1.524 m)  71.668 kg (158 lb)   GENERAL: Well-developed, well-nourished female in mild distress.  HEENT: normocephalic  HEART: normal rate  RESP: normal effort  ABDOMEN: Soft, non-tender, gravid appropriate for gestational age  EXTREMITIES: Nontender, no edema  NEURO: alert and oriented  Dilation: 1  Effacement (%): 50  Cervical Position: Posterior  Exam by:: [place], CNM  Dilation: 1   Effacement (%): 50  Cervical Position: Posterior  Exam by:: [place], CNM  FHT: Baseline 140 , moderate variability, accelerations present, no decelerations  Contractions: q 2-3 mins, mild-moderate  Labs:  Results for orders placed during the hospital encounter of 08/09/12 (from the past 24 hour(s))   URINALYSIS, ROUTINE W REFLEX MICROSCOPIC Status: None    Collection Time    08/09/12 11:15 PM   Result  Value  Range    Color, Urine  YELLOW  YELLOW    APPearance  CLEAR  CLEAR    Specific Gravity, Urine  1.010  1.005 - 1.030    pH  6.0  5.0 - 8.0    Glucose, UA  NEGATIVE  NEGATIVE mg/dL    Hgb urine dipstick  NEGATIVE  NEGATIVE    Bilirubin Urine  NEGATIVE  NEGATIVE    Ketones, ur  NEGATIVE  NEGATIVE mg/dL    Protein, ur  NEGATIVE  NEGATIVE mg/dL    Urobilinogen, UA  0.2  0.0 - 1.0 mg/dL    Nitrite  NEGATIVE  NEGATIVE    Leukocytes, UA  NEGATIVE  NEGATIVE   Imaging:  NA  MAU Course:  No improvement in UC's w/ IV bolus and Procardia 10 mg Q20 min x 4. No cervical change. Per consult w/ Dr. Rana Snare, give Terb. May repeat x 1.  Minimal decrease in UC's after 2 doses of Terb. Dr. Rana Snare notified.  Assessment:  1.  Preterm uterine contractions, third trimester    Plan:  Admit to Antenatal per Dr. Rana Snare for Mag And BMZ.  [place], CNM  08/10/2012  3:42 AM  I discussed plan of care with the patient and her husband.  Continiue Mag until quiescent, most likely 12-24 h and also for CP prophylaxis BMZ for fetal lung maturity.  FU GBS culture.  Unable to get FFN due to recent IC. DL

## 2012-08-10 NOTE — Progress Notes (Signed)
Pt feeling much better.  No ctx or vb.  No lof.  + FM  + FHT Toco quiet w/ occasional ctx Gen - NAD Abd - gravid, NT Ext - NT  A/P:  PTL Continue mag x 24 hrs Complete BMZ course bedrest

## 2012-08-10 NOTE — MAU Note (Signed)
Pt. Up to use restroom. Still feels the same amount of contractions. [place], CNM notified and will do cervical exam to reassess pt.

## 2012-08-11 ENCOUNTER — Observation Stay (HOSPITAL_COMMUNITY): Payer: PRIVATE HEALTH INSURANCE

## 2012-08-11 MED ORDER — BUTORPHANOL TARTRATE 1 MG/ML IJ SOLN: 1.0000 mg | Freq: Once | INTRAMUSCULAR | Status: DC

## 2012-08-11 MED ORDER — NIFEDIPINE 10 MG PO CAPS
10.0000 mg | ORAL_CAPSULE | ORAL | Status: DC | PRN
  Administered 2012-08-11 (×2): 10 mg via ORAL
  Filled 2012-08-11 (×2): qty 1

## 2012-08-11 MED ORDER — NIFEDIPINE 10 MG PO CAPS
20.0000 mg | ORAL_CAPSULE | ORAL | Status: DC
  Administered 2012-08-11 – 2012-08-12 (×5): 20 mg via ORAL
  Filled 2012-08-11 (×5): qty 2

## 2012-08-11 NOTE — Progress Notes (Signed)
Pt feeling better off magnesium but feeling mild cramps and occ ctx.  + FM + FHT toco - irritable Gen - NAD, resting Abd - gravid, NT Ext - no edema PV - deferred  A/P:  Will try procardia prn today. S/p BMZ x 2 bedrest

## 2012-08-11 NOTE — Consult Note (Addendum)
Asked by Dr. Renaldo Fiddler to provide prenatal consultation for patient at risk for preterm delivery due to preterm labor at 31+ wks.  Mother is 26y.o. G1, pregnancy previously uncomplicated..  She is being treated with betamethasone and Mg sulfate for tocolysis and neuroprophylaxis.  Discussed with patient and her husband the usual expectations for preterm infant at 31+ weeks gestation, including possible needs for DR resuscitation, respiratory support, IV access, and antibiotics. Projected possible length of stay in NICU until 36 - [redacted] wks EGA.  Discussed advantages of feeding with mother's milk (she plans to pump postnatally).  They were attentive, had few questions, and were appreciative of my input.  Thank you for consulting Neonatology.   I spent 25 minutes reviewing records and face-to-face with patient.

## 2012-08-11 NOTE — Progress Notes (Addendum)
Pt has been feeling increased contractions through the day.  Procardia increased to 20mg  Q4hrs.  Pt reports after last dose of procardia, her ctx have decreased in intensity and frequency.  Reports good FM.  No VB  AF, VSS Toco - irritable FHT +   A/P: PTL - symptoms improved   Will order [place] for EFW and AFI Continue hosptital bedrest and procardia

## 2012-08-12 MED ORDER — NIFEDIPINE 20 MG PO CAPS: 20.0000 mg | ORAL_CAPSULE | ORAL | Status: DC

## 2012-08-12 NOTE — Progress Notes (Signed)
Pt reports occ ctx.  No vb or lof.  + FM  AF, VSS + FHT Toco occasional Gen - NAD Abd - gravid, NT Cvx FT/50/-3, soft   [place]:  (4/19) vtx 1920 gm (50%) AFI 18.6cm cvx 2cm  A/P:  PTL cvx unchanged and sx well controlled with procardia Ok for d/c home on modified bedrest No work RTO next week as scheduled.

## 2012-08-12 NOTE — Progress Notes (Signed)
Pt. Is stable and ready to be discharged home. All belongings are with patient. Discharge instructions given and all questions answered. Pt. Leaving with husband.

## 2012-08-12 NOTE — Discharge Planning (Signed)
THE [facility]  ANTENATAL UNIT [id number] [place] [address] Phone: [phone] Fax: [phone]  August 12, 2012  Patient: [name]  Date of Birth: May 08, 1985  Date of Visit: 06/04/2012    To Whom It May Concern:  Taneil Lazarus was seen and treated in our Antenatal Unit from 08/09/2012 - 08/12/12. Jerricka Carvey, her husband, was at the bedside from 08/09/12 -08/12/12. Please excuse his absence.   Sincerely,   Dr. Zelphia Cairo

## 2012-08-12 NOTE — Discharge Summary (Signed)
Physician Discharge Summary  Patient ID: [name] MRN: [id number] DOB/AGE: 07/04/1985 26 y.o.  Admit date: 08/09/2012 Discharge date: 08/12/2012  Admission Diagnoses: PTL  Discharge Diagnoses: PTL   Discharged Condition: good  Hospital Course: Pt was admitted with complaints of preterm contractions.  Cervix was 1cm by exam and pt was having regular, painful contractions.  Unable to decrease contractions with procardia and magnesium was started.  Pt was admitted and received BMZ series.  Mag was discontinued after 24 hrs but contractions restarted and pt was given procardia.  On HD #2, contractions were stable and cervix was unchanged.  D/c home on modified bedrest  Consults: None  Significant Diagnostic Studies: radiology: Ultrasound: EFW, AFI  Treatments: IV hydration and tocolysis  Discharge Exam: Blood pressure 95/60, pulse 97, temperature 98.2 F (36.8 C), temperature source Oral, resp. rate 18, height 5' (1.524 m), weight 71.668 kg (158 lb), last menstrual period 01/01/2012, SpO2 98.00%. General appearance: alert and cooperative GI: gravid, NT cervix unchanged:  FT/50/-3, soft  Disposition: 01-Home or Self Care   Future Appointments Provider Department Dept Phone   08/14/2012 10:30 AM [facility] [place] [facility] [phone]   08/21/2012 2:00 PM Cleophas Dunker, LMFT BEHAVIORAL HEALTH OUTPATIENT THERAPY [place] [phone]   10/02/2012 9:00 AM Cleophas Dunker, LMFT BEHAVIORAL HEALTH OUTPATIENT THERAPY [place] [phone]   10/23/2012 10:00 AM Cleophas Dunker, LMFT BEHAVIORAL HEALTH OUTPATIENT THERAPY [place] [phone]   11/13/2012 10:00 AM Cleophas Dunker, LMFT BEHAVIORAL HEALTH OUTPATIENT THERAPY [place] [phone]   12/04/2012 10:00 AM Cleophas Dunker, LMFT BEHAVIORAL HEALTH OUTPATIENT THERAPY [name] [phone]       Medication List    TAKE these medications       acetaminophen 500 MG tablet  Commonly known as:  TYLENOL   Take 500 mg by mouth daily as needed for pain.     buPROPion 300 MG 24 hr tablet  Commonly known as:  WELLBUTRIN XL  Take 1 tablet (300 mg total) by mouth daily.     busPIRone 15 MG tablet  Commonly known as:  BUSPAR  Take 1 tablet (15 mg total) by mouth 3 (three) times daily.     calcium carbonate 500 MG chewable tablet  Commonly known as:  TUMS - dosed in mg elemental calcium  Chew 2 tablets by mouth daily as needed for heartburn.     folic acid 1 MG tablet  Commonly known as:  FOLVITE  Take 1 mg by mouth 3 (three) times daily.     NIFEdipine 20 MG capsule  Commonly known as:  PROCARDIA  Take 1 capsule (20 mg total) by mouth every 4 (four) hours.     prenatal multivitamin Tabs  Take 1 tablet by mouth daily at 12 noon.     ranitidine 75 MG tablet  Commonly known as:  ZANTAC  Take 75 mg by mouth daily.           Follow-up Information   Follow up In 1 week.   Contact information:   as scheduled      Signed: Vanessia Bokhari 08/12/2012, 9:12 AM

## 2012-08-14 ENCOUNTER — Ambulatory Visit (HOSPITAL_COMMUNITY): Payer: PRIVATE HEALTH INSURANCE

## 2012-08-14 LAB — CULTURE, BETA STREP (GROUP B ONLY)

## 2012-08-21 ENCOUNTER — Ambulatory Visit (INDEPENDENT_AMBULATORY_CARE_PROVIDER_SITE_OTHER): Payer: No Typology Code available for payment source | Admitting: Marriage and Family Therapist

## 2012-08-21 ENCOUNTER — Telehealth (HOSPITAL_COMMUNITY): Payer: Self-pay | Admitting: Marriage and Family Therapist

## 2012-08-21 DIAGNOSIS — F411 Generalized anxiety disorder: Secondary | ICD-10-CM

## 2012-08-21 DIAGNOSIS — F988 Other specified behavioral and emotional disorders with onset usually occurring in childhood and adolescence: Secondary | ICD-10-CM

## 2012-08-21 NOTE — Progress Notes (Signed)
   THERAPIST PROGRESS NOTE  Session Time: 2:00 - 3:00 p.m.  Participation Level: Active  Behavioral Response: CasualAlertAnxious and Tearful  Type of Therapy: Individual Therapy  Treatment Goals addressed: Coping  Interventions: Strength-based and Supportive  Summary: [name] is a 27 y.o. female who presents with anxiety and ADHD.  Patient requested she bring her husband in to discuss his lying about talking to a fellow female student on-line.  Patient signed a release for her husband.  Both discussed the issue and both stated they believed that the issue of patient's husband watching pornography in an addictive manner from more than four years ago is still an issue between them.  Husband said the evidence is that he consistently is careful and has anxiety over any kind of interaction with a female outside of his marriage wondering what patient is thinking, and whether or not he should tell her.  Patient states she has been checking husband's email to calm any thoughts about whether or not her husband has been faithful.  She also admits it has been difficult even after taking her husband back and past therapy to completely trust him even thought until recently husband has not given her any reason not to trust him.  Patient also states she spent the weekend in the hospital with contractions and is on medication to stop the contractions and is not allowed to work until she is 34 weeks.  Suicidal/Homicidal: Nowithout intent/plan  Therapist Response:  Discussed the idea that the couple has continued to involve the past issue of husband's pornography addicton in both of their lives and act on it according to what they believe the other's response will be. Both agree this is true.  Discussed husband's lie about talking to another female student (both agree the discussion was benign, having to do with a homework assignment) was how husband believed it should be handled to protect wife but now he  knows it was a mistake.  Discussed both learning how to accept each other and begin to consider letting go of an issue that happened over four years ago since until now patient agrees nothing has happened and husband has changed.  Discussed the fears and frustrations for both that this issue has caused in their marriage.  Patient also states that not having sex has been an issue and being intimate is something that helps patient feel less insecure.  Husband states he is having some difficulty having sex with patient because of the pregnancy.  Told patient this is quite common as husband is seeing her as a mother causing conflict.  She also cannot have sex right now because of the contractions.  By the end of the session, both agreed they would consider letting go of this past issue since husband has proven he is trustworthy.  Patient is not sure if she will be having another session due to the possibility of delivering the baby. Will try for two weeks.  Plan: Return again in 2 weeks.  Diagnosis: Axis I: GAD; ADHD w/o hyperctivity    Axis II: Deferred    Christine Vo, LMFT, CTS 08/21/2012

## 2012-08-31 ENCOUNTER — Inpatient Hospital Stay (HOSPITAL_COMMUNITY): Payer: PRIVATE HEALTH INSURANCE | Admitting: Anesthesiology

## 2012-08-31 ENCOUNTER — Encounter (HOSPITAL_COMMUNITY): Payer: Self-pay | Admitting: Anesthesiology

## 2012-08-31 ENCOUNTER — Encounter (HOSPITAL_COMMUNITY): Payer: Self-pay | Admitting: Family

## 2012-08-31 ENCOUNTER — Inpatient Hospital Stay (HOSPITAL_COMMUNITY)
Admission: AD | Admit: 2012-08-31 | Discharge: 2012-09-05 | DRG: 775 | Disposition: A | Discharge status: Home / Self Care | Payer: PRIVATE HEALTH INSURANCE | Source: Ambulatory Visit | Attending: Obstetrics & Gynecology | Admitting: Obstetrics & Gynecology

## 2012-08-31 LAB — CBC
HCT: 36.7 % (ref 36.0–46.0)
MCH: 31.7 pg (ref 26.0–34.0)
MCV: 92.2 fL (ref 78.0–100.0)
Platelets: 167 10*3/uL (ref 150–400)
RBC: 3.98 MIL/uL (ref 3.87–5.11)
RDW: 12.7 % (ref 11.5–15.5)
WBC: 14.9 10*3/uL — ABNORMAL HIGH (ref 4.0–10.5)

## 2012-08-31 LAB — FETAL FIBRONECTIN: Fetal Fibronectin: POSITIVE — AB

## 2012-08-31 MED ORDER — BUSPIRONE HCL 15 MG PO TABS
15.0000 mg | ORAL_TABLET | Freq: Three times a day (TID) | ORAL | Status: DC
  Administered 2012-08-31 – 2012-09-03 (×9): 15 mg via ORAL
  Filled 2012-08-31 (×13): qty 3

## 2012-08-31 MED ORDER — MORPHINE SULFATE 4 MG/ML IJ SOLN
4.0000 mg | Freq: Once | INTRAMUSCULAR | Status: AC
  Administered 2012-08-31: 4 mg via INTRAVENOUS
  Filled 2012-08-31: qty 1

## 2012-08-31 MED ORDER — CALCIUM CARBONATE ANTACID 500 MG PO CHEW
2.0000 | CHEWABLE_TABLET | ORAL | Status: DC | PRN
  Administered 2012-09-02: 400 mg via ORAL
  Filled 2012-08-31: qty 2

## 2012-08-31 MED ORDER — ZOLPIDEM TARTRATE 5 MG PO TABS: 5.0000 mg | ORAL_TABLET | Freq: Every evening | ORAL | Status: DC | PRN

## 2012-08-31 MED ORDER — NIFEDIPINE 10 MG PO CAPS
20.0000 mg | ORAL_CAPSULE | Freq: Once | ORAL | Status: AC
  Administered 2012-08-31: 20 mg via ORAL
  Filled 2012-08-31: qty 2

## 2012-08-31 MED ORDER — SODIUM CHLORIDE 0.9 % IV SOLN: 250.0000 mL | INTRAVENOUS | Status: DC | PRN

## 2012-08-31 MED ORDER — ZOLPIDEM TARTRATE 5 MG PO TABS
5.0000 mg | ORAL_TABLET | Freq: Every evening | ORAL | Status: DC | PRN
  Administered 2012-09-01 – 2012-09-03 (×2): 5 mg via ORAL
  Filled 2012-08-31 (×2): qty 1

## 2012-08-31 MED ORDER — DOCUSATE SODIUM 100 MG PO CAPS
100.0000 mg | ORAL_CAPSULE | Freq: Every day | ORAL | Status: DC
  Administered 2012-09-01 – 2012-09-02 (×2): 100 mg via ORAL
  Filled 2012-08-31 (×4): qty 1

## 2012-08-31 MED ORDER — SODIUM CHLORIDE 0.9 % IJ SOLN: 3.0000 mL | Freq: Two times a day (BID) | INTRAMUSCULAR | Status: DC

## 2012-08-31 MED ORDER — ACETAMINOPHEN 325 MG PO TABS
650.0000 mg | ORAL_TABLET | ORAL | Status: DC | PRN
  Administered 2012-09-01: 650 mg via ORAL
  Filled 2012-08-31: qty 2

## 2012-08-31 MED ORDER — PRENATAL MULTIVITAMIN CH
1.0000 | ORAL_TABLET | Freq: Every day | ORAL | Status: DC
  Administered 2012-09-01 – 2012-09-02 (×2): 1 via ORAL
  Filled 2012-08-31 (×2): qty 1

## 2012-08-31 MED ORDER — NIFEDIPINE 10 MG PO CAPS
10.0000 mg | ORAL_CAPSULE | Freq: Four times a day (QID) | ORAL | Status: DC
  Administered 2012-08-31: 10 mg via ORAL
  Filled 2012-08-31 (×2): qty 1

## 2012-08-31 MED ORDER — PROMETHAZINE HCL 25 MG/ML IJ SOLN
12.5000 mg | Freq: Once | INTRAMUSCULAR | Status: AC
  Administered 2012-08-31: 16:00:00 via INTRAVENOUS
  Filled 2012-08-31: qty 1

## 2012-08-31 MED ORDER — LACTATED RINGERS IV SOLN
INTRAVENOUS | Status: DC
  Administered 2012-08-31 – 2012-09-02 (×7): via INTRAVENOUS

## 2012-08-31 MED ORDER — BUPROPION HCL ER (XL) 300 MG PO TB24
300.0000 mg | ORAL_TABLET | Freq: Every day | ORAL | Status: DC
  Administered 2012-09-01 – 2012-09-03 (×3): 300 mg via ORAL
  Filled 2012-08-31 (×4): qty 1

## 2012-08-31 MED ORDER — SODIUM CHLORIDE 0.9 % IJ SOLN: 3.0000 mL | INTRAMUSCULAR | Status: DC | PRN

## 2012-08-31 NOTE — Anesthesia Preprocedure Evaluation (Addendum)
Anesthesia Evaluation  Patient identified by MRN, date of birth, ID band Patient awake    Reviewed: Allergy & Precautions, H&P , Patient's Chart, lab work & pertinent test results  Airway Mallampati: II TM Distance: >3 FB Neck ROM: full    Dental no notable dental hx.    Pulmonary neg pulmonary ROS,  breath sounds clear to auscultation  Pulmonary exam normal       Cardiovascular negative cardio ROS  Rhythm:regular Rate:Normal     Neuro/Psych PSYCHIATRIC DISORDERS  Neuromuscular disease negative neurological ROS  negative psych ROS   GI/Hepatic negative GI ROS, Neg liver ROS, GERD-  Controlled,  Endo/Other  negative endocrine ROS  Renal/GU negative Renal ROS     Musculoskeletal   Abdominal   Peds  Hematology negative hematology ROS (+)   Anesthesia Other Findings Depression   sees Jorje Guild PA at [facility] GERD (gastroesophageal reflux disease)        HPV (human papilloma virus) infection     Anxiety        Abnormal Pap smear of cervix     Gynecological examination   sees Dr. Henderson Cloud    Tracheomalacia, congenital     Brachydactyly of fingers        Rheumatoid arthritis      Reproductive/Obstetrics (+) Pregnancy                           Anesthesia Physical Anesthesia Plan  ASA: III  Anesthesia Plan: Epidural   Post-op Pain Management:    Induction:   Airway Management Planned:   Additional Equipment:   Intra-op Plan:   Post-operative Plan:   Informed Consent: I have reviewed the patients History and Physical, chart, labs and discussed the procedure including the risks, benefits and alternatives for the proposed anesthesia with the patient or authorized representative who has indicated his/her understanding and acceptance.     Plan Discussed with:   Anesthesia Plan Comments:        L5-S1 spinal instrumentation.  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 Not since a child) and rheumatoid disease (mainly effects knees and hands... Patient denies atlanto-occipital instability).  Discussed possible need for SAB if epidural is not functional for CS.  All the patient's questions were answered. Anesthesia Quick Evaluation

## 2012-08-31 NOTE — Progress Notes (Signed)
Brayton Caves MD notified of anesthesiology consult for epidural with a history of lumbar fusion.  MD request that the OB call him to discuss.

## 2012-08-31 NOTE — MAU Provider Note (Signed)
History     CSN: [id number]  Arrival date and time: 08/31/12 1031   None     No chief complaint on file.  HPI This is a 27 y.o. female at [redacted]w[redacted]d who presents with c/o contractions.  Denies leaking or bleeding. Has been on Procardia at home.  RN Note: Patient presents to MAU from Dr. Vance Gather office with c/o contractions every 5 minutes since 0130 today. Reports +FM; denies vaginal bleeding or LOF.  Last dose of Procardia at 0400 today.       OB History   Grav Para Term Preterm Abortions TAB SAB Ect Mult Living   1               Past Medical History  Diagnosis Date  . Depression     sees Jorje Guild PA at [facility]  . GERD (gastroesophageal reflux disease)   . HPV (human papilloma virus) infection   . Anxiety   . Abnormal Pap smear of cervix   . Gynecological examination     sees Dr. Henderson Cloud  . Tracheomalacia, congenital   . Brachydactyly of fingers   . Rheumatoid arthritis     Past Surgical History  Procedure Laterality Date  . Hand surgery  1996    to elongate the right 3rd metacarpal   . Lumbar fusion  2008    per Dr. Gerlene Fee on L4-5 for spondylolisthesis   . Back surgery  2008    Family History  Problem Relation Age of Onset  . Arthritis Mother   . Depression Mother   . Hyperlipidemia Father   . Heart disease Father   . Hypertension Father   . Arthritis Father   . Arthritis Brother   . Psoriasis Brother   . Cleft lip Brother     without palate  . Uterine cancer Maternal Aunt     x 2  . Seizures Maternal Aunt   . Mental retardation Maternal Aunt     mild  . Colon cancer Neg Hx   . Seizures Maternal Aunt   . Seizures Cousin     History  Substance Use Topics  . Smoking status: Former Smoker -- 0.50 packs/day    Types: Cigarettes    Quit date: 08/11/2007  . Smokeless tobacco: Never Used  . Alcohol Use: No    Allergies:  Allergies  Allergen Reactions  . Nutmeg Oil (Myristica Oil) Hives    Prescriptions prior to admission   Medication Sig Dispense Refill  . acetaminophen (TYLENOL) 500 MG tablet Take 500 mg by mouth daily as needed for pain.      Marland Kitchen buPROPion (WELLBUTRIN XL) 300 MG 24 hr tablet Take 1 tablet (300 mg total) by mouth daily.  90 tablet  0  . busPIRone (BUSPAR) 15 MG tablet Take 1 tablet (15 mg total) by mouth 3 (three) times daily.  270 tablet  0  . calcium carbonate (TUMS - DOSED IN MG ELEMENTAL CALCIUM) 500 MG chewable tablet Chew 2 tablets by mouth daily as needed for heartburn.      . folic acid (FOLVITE) 1 MG tablet Take 1 mg by mouth 3 (three) times daily.      Marland Kitchen NIFEdipine (PROCARDIA) 10 MG capsule Take 10 mg by mouth every 4 (four) hours.      . Prenatal Vit-Fe Fumarate-FA (PRENATAL MULTIVITAMIN) TABS Take 1 tablet by mouth at bedtime.       . ranitidine (ZANTAC) 75 MG tablet Take 75 mg by mouth daily as needed for  heartburn.         Review of Systems  Constitutional: Negative for fever and malaise/fatigue.  Gastrointestinal: Positive for abdominal pain. Negative for nausea, vomiting, diarrhea and constipation.  Genitourinary: Negative for dysuria.  Neurological: Negative for dizziness and headaches.   Physical Exam   Blood pressure 124/81, pulse 97, temperature 98.1 F (36.7 C), temperature source Oral, resp. rate 16, height 5' (1.524 m), weight 166 lb (75.297 kg), last menstrual period 01/01/2012.  Physical Exam  Constitutional: She is oriented to person, place, and time. She appears well-developed and well-nourished. No distress.  HENT:  Head: Normocephalic.  Cardiovascular: Normal rate.   Respiratory: Effort normal.  GI: Soft. She exhibits no distension. There is no tenderness. There is no rebound and no guarding.  Genitourinary: Vagina normal and uterus normal. No vaginal discharge found.  Dilation: 2.5 Effacement (%): 80 Cervical Position: Posterior Station: -2 Presentation: Vertex Exam by:: C.Millican, RN    Musculoskeletal: Normal range of motion.  Neurological: She is  alert and oriented to person, place, and time.  Skin: Skin is warm and dry.  Psychiatric: She has a normal mood and affect.    MAU Course  Procedures  MDM FFn sent per order Dr Rana Snare.  He ordered GBS PCR but it is on hold until we see if she is in active labor, per lab protocol.  Per Dr Langston Masker, offer Terbutaline for comfort, not tocolysis, Pt refused.  Cervix unchanged after an hour. FFn +.    Assessment and Plan  A:  SIUP at [redacted]w[redacted]d       Preterm contractions  P:  Admit per Dr Harlow Mares 08/31/2012, 12:13 PM

## 2012-08-31 NOTE — MAU Note (Signed)
Patient presents to MAU from Dr. Vance Gather office with c/o contractions every 5 minutes since 0130 today. Reports +FM; denies vaginal bleeding or LOF.  Last dose of Procardia at 0400 today.

## 2012-08-31 NOTE — H&P (Signed)
[name] is a 27 y.o. female presenting for PTL.  Patient has been admitted previously with the same diagnosis and has received BMZ 4/18-19.  She has had symptomatic tx with Procardia.  Her CTX became stronger around 0100 this am.  She came into the office today for evaluation and was sent to MAU.  At last SVE, she was 1cm and has changed to 2-3/80/-2.  She denies LOF and VB and reports good FM.  Her antepartum course is complicated by Depression, h/o spinal fusion, and unconfirmed dx of SLE vs RA (on no meds).    Maternal Medical History:  Reason for admission: Contractions.   Contractions: Onset was 6-12 hours ago.   Frequency: irregular.   Perceived severity is moderate.    Fetal activity: Perceived fetal activity is normal.   Last perceived fetal movement was within the past hour.    Prenatal complications: Preterm labor.   Prenatal Complications - Diabetes: none.    OB History   Grav Para Term Preterm Abortions TAB SAB Ect Mult Living   1              Past Medical History  Diagnosis Date  . Depression     sees Jorje Guild PA at [facility]  . GERD (gastroesophageal reflux disease)   . HPV (human papilloma virus) infection   . Anxiety   . Abnormal Pap smear of cervix   . Gynecological examination     sees Dr. Henderson Cloud  . Tracheomalacia, congenital   . Brachydactyly of fingers   . Rheumatoid arthritis    Past Surgical History  Procedure Laterality Date  . Hand surgery  1996    to elongate the right 3rd metacarpal   . Lumbar fusion  2008    per Dr. Gerlene Fee on L4-5 for spondylolisthesis   . Back surgery  2008   Family History: family history includes Arthritis in her brother, father, and mother; Cleft lip in her brother; Depression in her mother; Heart disease in her father; Hyperlipidemia in her father; Hypertension in her father; Mental retardation in her maternal aunt; Psoriasis in her brother; Seizures in her cousin and maternal aunts; and Uterine cancer in  her maternal aunt.  There is no history of Colon cancer. Social History:  reports that she quit smoking about 5 years ago. Her smoking use included Cigarettes. She smoked 0.50 packs per day. She has never used smokeless tobacco. She reports that she does not drink alcohol or use illicit drugs.   Prenatal Transfer Tool  Maternal Diabetes: No Genetic Screening: Normal Maternal Ultrasounds/Referrals: Normal Fetal Ultrasounds or other Referrals:  None Maternal Substance Abuse:  No Significant Maternal Medications:  Meds include: Other: Wellbutrin, Buspar Significant Maternal Lab Results:  None Other Comments:  None  ROS  Dilation: 2.5 Effacement (%): 90 Station: -2 Exam by:: Haskell Rihn DO Blood pressure 125/77, pulse 85, temperature 98.5 F (36.9 C), temperature source Oral, resp. rate 20, height 5' (1.524 m), weight 166 lb (75.297 kg), last menstrual period 01/01/2012. Maternal Exam:  Uterine Assessment: Contraction strength is mild.  Contraction frequency is irregular.   Abdomen: Patient reports no abdominal tenderness. Fundal height is c/w dates.   Estimated fetal weight is 5#.   Fetal presentation: vertex  Introitus: Normal vulva. Normal vagina.  Pelvis: adequate for delivery.   Cervix: Cervix evaluated by digital exam.     Physical Exam  Constitutional: She is oriented to person, place, and time. She appears well-developed and well-nourished.  GI: Soft.  There is no rebound and no guarding.  Genitourinary: Vagina normal and uterus normal.  Neurological: She is alert and oriented to person, place, and time.  Skin: Skin is warm and dry.  Psychiatric: She has a normal mood and affect. Her behavior is normal.    Prenatal labs: ABO, Rh:   Antibody:   Rubella:   RPR: NON REACTIVE (04/18 0414)  HBsAg:    HIV:    GBS:     Assessment/Plan: 26yo G1 at [redacted]w[redacted]d with PTL -Admit for obs -Anesthesia consult s/p lumbar fusion -Narcotic rest -Monitor for labor; will not  tocolyze     Krystle Oberman 08/31/2012, 3:38 PM

## 2012-09-01 ENCOUNTER — Inpatient Hospital Stay (HOSPITAL_COMMUNITY): Payer: PRIVATE HEALTH INSURANCE

## 2012-09-01 MED ORDER — TERBUTALINE SULFATE 1 MG/ML IJ SOLN
INTRAMUSCULAR | Status: AC
  Filled 2012-09-01: qty 1

## 2012-09-01 MED ORDER — NIFEDIPINE 10 MG PO CAPS
20.0000 mg | ORAL_CAPSULE | Freq: Once | ORAL | Status: AC
  Administered 2012-09-01: 20 mg via ORAL
  Filled 2012-09-01: qty 2

## 2012-09-01 MED ORDER — MORPHINE SULFATE 4 MG/ML IJ SOLN
4.0000 mg | Freq: Once | INTRAMUSCULAR | Status: AC
  Administered 2012-09-01: 4 mg via INTRAVENOUS
  Filled 2012-09-01: qty 1

## 2012-09-01 MED ORDER — NIFEDIPINE 10 MG PO CAPS
20.0000 mg | ORAL_CAPSULE | ORAL | Status: DC
  Administered 2012-09-02: 20 mg via ORAL
  Filled 2012-09-01: qty 2

## 2012-09-01 MED ORDER — HYDROCODONE-ACETAMINOPHEN 5-325 MG PO TABS
1.0000 | ORAL_TABLET | Freq: Once | ORAL | Status: AC
  Administered 2012-09-01: 1 via ORAL
  Filled 2012-09-01: qty 1

## 2012-09-01 MED ORDER — PROMETHAZINE HCL 25 MG/ML IJ SOLN
12.5000 mg | Freq: Once | INTRAMUSCULAR | Status: AC
  Administered 2012-09-01: 25 mg via INTRAVENOUS
  Filled 2012-09-01: qty 1

## 2012-09-01 MED ORDER — TERBUTALINE SULFATE 1 MG/ML IJ SOLN: 0.2500 mg | Freq: Once | INTRAMUSCULAR | Status: DC

## 2012-09-01 NOTE — Progress Notes (Signed)
Rested well overnight until 620 this AM when CTX returned intensely.  She was tearful with pain and RN rechecked cervix and it was unchanged at 2-3cm and posterior.  She was give Vicodin 5mg  without improvement.  At 815, she was continuing to painfully contract and she received 1 terbutaline injection which helped minimally.   Fibrinogen 687  VSS. Gen: Uncomfortable appearing Abd: Palpable moderate CTX q 2 min SVE: 2-3/80/-2, ballotable FHT: 150, Cat I Toco: q 2 min  26yo G1 at [redacted]w[redacted]d with PTL -Will narcotic rest since helpful yesterday -U/S for BPP, position, EFW -Continuous EFM -Continue to monitor for cervical change -SCDs

## 2012-09-02 ENCOUNTER — Inpatient Hospital Stay (HOSPITAL_COMMUNITY): Payer: PRIVATE HEALTH INSURANCE

## 2012-09-02 MED ORDER — MORPHINE SULFATE 4 MG/ML IJ SOLN
4.0000 mg | Freq: Once | INTRAMUSCULAR | Status: AC
  Administered 2012-09-02: 4 mg via INTRAVENOUS
  Filled 2012-09-02: qty 1

## 2012-09-02 MED ORDER — PROMETHAZINE HCL 25 MG/ML IJ SOLN
12.5000 mg | Freq: Once | INTRAMUSCULAR | Status: AC
  Administered 2012-09-02: 12.5 mg via INTRAVENOUS
  Filled 2012-09-02: qty 1

## 2012-09-02 MED ORDER — NIFEDIPINE 10 MG PO CAPS
20.0000 mg | ORAL_CAPSULE | Freq: Four times a day (QID) | ORAL | Status: DC | PRN
  Administered 2012-09-03: 20 mg via ORAL
  Filled 2012-09-02 (×2): qty 2

## 2012-09-02 NOTE — Progress Notes (Signed)
Rested well overnight and has current uterine cramping but much improved compared to yesterday.  Thinks Procardia prn dosing has helped.   VSS.   Gen: NAD Abd: soft, NT  FHT: 150, Cat 2 (no accelerations)  Toco: irritability  26yo G1 at [redacted]w[redacted]d with PTL  -Procardia 20 prn not to exceed tid  -Continuous EFM  -Possible D/C home tomorrow if reassuring fetal status and well controlled symptoms -SCDs

## 2012-09-03 ENCOUNTER — Encounter (HOSPITAL_COMMUNITY): Payer: Self-pay | Admitting: *Deleted

## 2012-09-03 LAB — CULTURE, BETA STREP (GROUP B ONLY)

## 2012-09-03 LAB — OB RESULTS CONSOLE ABO/RH: RH Type: POSITIVE

## 2012-09-03 LAB — AMNISURE RUPTURE OF MEMBRANE (ROM) NOT AT ARMC: Amnisure ROM: POSITIVE

## 2012-09-03 LAB — CBC
HCT: 32.4 % — ABNORMAL LOW (ref 36.0–46.0)
MCV: 92 fL (ref 78.0–100.0)
Platelets: 161 10*3/uL (ref 150–400)
RBC: 3.52 MIL/uL — ABNORMAL LOW (ref 3.87–5.11)
WBC: 15.7 10*3/uL — ABNORMAL HIGH (ref 4.0–10.5)

## 2012-09-03 MED ORDER — PRENATAL MULTIVITAMIN CH: 1.0000 | ORAL_TABLET | Freq: Every day | ORAL | Status: DC

## 2012-09-03 MED ORDER — OXYCODONE-ACETAMINOPHEN 5-325 MG PO TABS: 1.0000 | ORAL_TABLET | ORAL | Status: DC | PRN

## 2012-09-03 MED ORDER — BENZOCAINE-MENTHOL 20-0.5 % EX AERO
1.0000 "application " | INHALATION_SPRAY | CUTANEOUS | Status: DC | PRN
  Administered 2012-09-04: 1 via TOPICAL
  Filled 2012-09-03: qty 56

## 2012-09-03 MED ORDER — WITCH HAZEL-GLYCERIN EX PADS: 1.0000 "application " | MEDICATED_PAD | CUTANEOUS | Status: DC | PRN

## 2012-09-03 MED ORDER — IBUPROFEN 600 MG PO TABS
600.0000 mg | ORAL_TABLET | Freq: Four times a day (QID) | ORAL | Status: DC
  Administered 2012-09-04 – 2012-09-05 (×7): 600 mg via ORAL
  Filled 2012-09-03 (×7): qty 1

## 2012-09-03 MED ORDER — ZOLPIDEM TARTRATE 5 MG PO TABS: 5.0000 mg | ORAL_TABLET | Freq: Every evening | ORAL | Status: DC | PRN

## 2012-09-03 MED ORDER — OXYCODONE-ACETAMINOPHEN 5-325 MG PO TABS
1.0000 | ORAL_TABLET | ORAL | Status: DC | PRN
  Administered 2012-09-04 – 2012-09-05 (×3): 1 via ORAL
  Filled 2012-09-03 (×2): qty 1
  Filled 2012-09-03: qty 11

## 2012-09-03 MED ORDER — SODIUM CHLORIDE 0.9 % IV SOLN
2.0000 g | Freq: Once | INTRAVENOUS | Status: DC
  Filled 2012-09-03: qty 2000

## 2012-09-03 MED ORDER — OXYTOCIN 40 UNITS IN LACTATED RINGERS INFUSION - SIMPLE MED
62.5000 mL/h | INTRAVENOUS | Status: DC
  Filled 2012-09-03: qty 1000

## 2012-09-03 MED ORDER — PHENYLEPHRINE 40 MCG/ML (10ML) SYRINGE FOR IV PUSH (FOR BLOOD PRESSURE SUPPORT)
80.0000 ug | PREFILLED_SYRINGE | INTRAVENOUS | Status: DC | PRN
  Filled 2012-09-03: qty 5
  Filled 2012-09-03: qty 2

## 2012-09-03 MED ORDER — LACTATED RINGERS IV SOLN: 500.0000 mL | INTRAVENOUS | Status: DC | PRN

## 2012-09-03 MED ORDER — ACETAMINOPHEN 325 MG PO TABS: 650.0000 mg | ORAL_TABLET | ORAL | Status: DC | PRN

## 2012-09-03 MED ORDER — FENTANYL 2.5 MCG/ML BUPIVACAINE 1/10 % EPIDURAL INFUSION (WH - ANES)
14.0000 mL/h | INTRAMUSCULAR | Status: DC | PRN
  Filled 2012-09-03: qty 125

## 2012-09-03 MED ORDER — EPHEDRINE 5 MG/ML INJ
10.0000 mg | INTRAVENOUS | Status: DC | PRN
  Filled 2012-09-03: qty 2

## 2012-09-03 MED ORDER — DIBUCAINE 1 % RE OINT: 1.0000 "application " | TOPICAL_OINTMENT | RECTAL | Status: DC | PRN

## 2012-09-03 MED ORDER — MEASLES, MUMPS & RUBELLA VAC ~~LOC~~ INJ: 0.5000 mL | INJECTION | Freq: Once | SUBCUTANEOUS | Status: DC

## 2012-09-03 MED ORDER — SENNOSIDES-DOCUSATE SODIUM 8.6-50 MG PO TABS
2.0000 | ORAL_TABLET | Freq: Every day | ORAL | Status: DC
  Administered 2012-09-03 – 2012-09-04 (×2): 2 via ORAL

## 2012-09-03 MED ORDER — IBUPROFEN 600 MG PO TABS
600.0000 mg | ORAL_TABLET | Freq: Four times a day (QID) | ORAL | Status: DC | PRN
  Administered 2012-09-03: 600 mg via ORAL
  Filled 2012-09-03: qty 1

## 2012-09-03 MED ORDER — BUPROPION HCL ER (XL) 300 MG PO TB24
300.0000 mg | ORAL_TABLET | Freq: Every day | ORAL | Status: DC
  Administered 2012-09-04 – 2012-09-05 (×2): 300 mg via ORAL
  Filled 2012-09-03 (×3): qty 1

## 2012-09-03 MED ORDER — DIPHENHYDRAMINE HCL 50 MG/ML IJ SOLN: 12.5000 mg | INTRAMUSCULAR | Status: DC | PRN

## 2012-09-03 MED ORDER — LIDOCAINE HCL (PF) 1 % IJ SOLN
INTRAMUSCULAR | Status: DC | PRN
  Administered 2012-09-03 (×2): 8 mL

## 2012-09-03 MED ORDER — CITRIC ACID-SODIUM CITRATE 334-500 MG/5ML PO SOLN: 30.0000 mL | ORAL | Status: DC | PRN

## 2012-09-03 MED ORDER — BUTORPHANOL TARTRATE 1 MG/ML IJ SOLN
2.0000 mg | Freq: Once | INTRAMUSCULAR | Status: AC
  Administered 2012-09-03: 2 mg via INTRAVENOUS
  Filled 2012-09-03: qty 2

## 2012-09-03 MED ORDER — SIMETHICONE 80 MG PO CHEW: 80.0000 mg | CHEWABLE_TABLET | ORAL | Status: DC | PRN

## 2012-09-03 MED ORDER — LACTATED RINGERS IV SOLN
500.0000 mL | Freq: Once | INTRAVENOUS | Status: AC
  Administered 2012-09-03: 500 mL via INTRAVENOUS

## 2012-09-03 MED ORDER — LACTATED RINGERS IV SOLN
INTRAVENOUS | Status: DC
  Administered 2012-09-03: 17:00:00 via INTRAVENOUS

## 2012-09-03 MED ORDER — PHENYLEPHRINE 40 MCG/ML (10ML) SYRINGE FOR IV PUSH (FOR BLOOD PRESSURE SUPPORT)
80.0000 ug | PREFILLED_SYRINGE | INTRAVENOUS | Status: DC | PRN
  Filled 2012-09-03: qty 2

## 2012-09-03 MED ORDER — ONDANSETRON HCL 4 MG/2ML IJ SOLN: 4.0000 mg | Freq: Four times a day (QID) | INTRAMUSCULAR | Status: DC | PRN

## 2012-09-03 MED ORDER — EPHEDRINE 5 MG/ML INJ
10.0000 mg | INTRAVENOUS | Status: DC | PRN
  Filled 2012-09-03: qty 2
  Filled 2012-09-03: qty 4

## 2012-09-03 MED ORDER — LIDOCAINE HCL (PF) 1 % IJ SOLN
30.0000 mL | INTRAMUSCULAR | Status: DC | PRN
  Filled 2012-09-03 (×2): qty 30

## 2012-09-03 MED ORDER — PRENATAL MULTIVITAMIN CH
1.0000 | ORAL_TABLET | Freq: Every day | ORAL | Status: DC
  Administered 2012-09-03 – 2012-09-04 (×2): 1 via ORAL
  Filled 2012-09-03 (×2): qty 1

## 2012-09-03 MED ORDER — FENTANYL 2.5 MCG/ML BUPIVACAINE 1/10 % EPIDURAL INFUSION (WH - ANES)
INTRAMUSCULAR | Status: DC | PRN
  Administered 2012-09-03: 14 mL/h via EPIDURAL

## 2012-09-03 MED ORDER — ONDANSETRON HCL 4 MG PO TABS: 4.0000 mg | ORAL_TABLET | ORAL | Status: DC | PRN

## 2012-09-03 MED ORDER — BUSPIRONE HCL 15 MG PO TABS
15.0000 mg | ORAL_TABLET | Freq: Three times a day (TID) | ORAL | Status: DC
  Administered 2012-09-03 – 2012-09-05 (×5): 15 mg via ORAL
  Filled 2012-09-03 (×9): qty 1

## 2012-09-03 MED ORDER — LANOLIN HYDROUS EX OINT: TOPICAL_OINTMENT | CUTANEOUS | Status: DC | PRN

## 2012-09-03 MED ORDER — ONDANSETRON HCL 4 MG/2ML IJ SOLN: 4.0000 mg | INTRAMUSCULAR | Status: DC | PRN

## 2012-09-03 MED ORDER — MEDROXYPROGESTERONE ACETATE 150 MG/ML IM SUSP: 150.0000 mg | INTRAMUSCULAR | Status: DC | PRN

## 2012-09-03 MED ORDER — FLEET ENEMA 7-19 GM/118ML RE ENEM: 1.0000 | ENEMA | Freq: Once | RECTAL | Status: DC

## 2012-09-03 MED ORDER — DIPHENHYDRAMINE HCL 25 MG PO CAPS: 25.0000 mg | ORAL_CAPSULE | Freq: Four times a day (QID) | ORAL | Status: DC | PRN

## 2012-09-03 MED ORDER — OXYTOCIN BOLUS FROM INFUSION
500.0000 mL | INTRAVENOUS | Status: DC
  Administered 2012-09-03: 500 mL via INTRAVENOUS

## 2012-09-03 MED ORDER — TETANUS-DIPHTH-ACELL PERTUSSIS 5-2.5-18.5 LF-MCG/0.5 IM SUSP: 0.5000 mL | Freq: Once | INTRAMUSCULAR | Status: DC

## 2012-09-03 NOTE — Progress Notes (Signed)
I visited with [name] on antenatal.  She was eager to deliver her baby and was frustrated that it has been such a long process.  I offered emotional support and encouragement.  [address] [place] Pager, [phone] 1:56 PM   09/03/12 1300  Clinical Encounter Type  Visited With Patient  Visit Type Spiritual support  Referral From Nurse

## 2012-09-03 NOTE — Progress Notes (Signed)
Dr Rana Snare notified pushing x 2 hours with rest x 15 min. Minimal descent despite pushing in left side-lying, handlebars, and rope pulling. Pt exhausted. Will come to assess.

## 2012-09-03 NOTE — Anesthesia Procedure Notes (Signed)
Epidural Patient location during procedure: OB Start time: 09/03/2012 1:17 PM End time: 09/03/2012 1:21 PM  Staffing Anesthesiologist: Sandrea Hughs Performed by: anesthesiologist   Preanesthetic Checklist Completed: patient identified, site marked, surgical consent, pre-op evaluation, timeout performed, IV checked, risks and benefits discussed and monitors and equipment checked  Epidural Patient position: sitting Prep: site prepped and draped and DuraPrep Patient monitoring: continuous pulse ox and blood pressure Approach: midline Injection technique: LOR air  Needle:  Needle type: Tuohy  Needle gauge: 17 G Needle length: 9 cm and 9 Needle insertion depth: 5 cm cm Catheter type: closed end flexible Catheter size: 19 Gauge Catheter at skin depth: 10 cm Test dose: negative and Other  Assessment Sensory level: T8 Events: blood not aspirated, injection not painful, no injection resistance, negative IV test and no paresthesia

## 2012-09-03 NOTE — Progress Notes (Signed)
Patient ID: [name], female   DOB: 28-Jul-1985, 27 y.o.   MRN: [id number] Pt c/o pain with ctxs Yesterday so severe she was crying Concerned that her husband couldn't get her back here if ctxs that severe at home VSSAF FHR 140s + accels Ctx mild-mod q 5-64min  Cx 3-4/90 per rn  Prodromal/latent PTL Pt and her husband want to stop tocolytics but concerned about d/c due to severity of ctxs Want to monitor for labor longer  DL

## 2012-09-04 LAB — CBC
HCT: 30.2 % — ABNORMAL LOW (ref 36.0–46.0)
Hemoglobin: 10.1 g/dL — ABNORMAL LOW (ref 12.0–15.0)
MCH: 30.9 pg (ref 26.0–34.0)
MCHC: 33.4 g/dL (ref 30.0–36.0)

## 2012-09-04 NOTE — Progress Notes (Signed)
Patient was referred for history of depression/anxiety. * Referral screened out by Clinical Social Worker because none of the following criteria appear to apply: ~ History of anxiety/depression during this pregnancy, or of post-partum depression. ~ Diagnosis of anxiety and/or depression within last 3 years ~ History of depression due to pregnancy loss/loss of child OR * Patient's symptoms currently being treated with medication and/or therapy. Please contact the Clinical Social Worker if needs arise, or if patient requests.  Patient has rx for Buspar and Wellbutrin. 

## 2012-09-04 NOTE — Progress Notes (Signed)
UR chart review completed.  

## 2012-09-04 NOTE — Anesthesia Postprocedure Evaluation (Signed)
Anesthesia Post Note  Patient: [name]  Procedure(s) Performed: * No procedures listed *  Anesthesia type: Epidural  Patient location: Mother/Baby  Post pain: Pain level controlled  Post assessment: Post-op Vital signs reviewed  Last Vitals:  Filed Vitals:   09/04/12 0653  BP: 114/79  Pulse: 76  Temp: 36.7 C  Resp: 19    Post vital signs: Reviewed  Level of consciousness: awake  Complications: No apparent anesthesia complications

## 2012-09-04 NOTE — Progress Notes (Signed)
Post Partum Day 1 Subjective: no complaints, up ad lib, voiding and tolerating PO  Objective: Blood pressure 114/79, pulse 76, temperature 98.1 F (36.7 C), temperature source Oral, resp. rate 19, height 5' (1.524 m), weight 166 lb (75.297 kg), last menstrual period 01/01/2012, SpO2 97.00%, unknown if currently breastfeeding.  Physical Exam:  General: alert and cooperative Lochia: appropriate Uterine Fundus: firm Incision: perineum intact, small labial edema noted DVT Evaluation: No evidence of DVT seen on physical exam. Negative Homan's sign. No cords or calf tenderness.   Recent Labs  09/03/12 1216 09/04/12 0600  HGB 11.4* 10.1*  HCT 32.4* 30.2*    Assessment/Plan: Plan for discharge tomorrow and Circumcision prior to discharge   LOS: 4 days   Garima Chronis G 09/04/2012, 8:03 AM

## 2012-09-04 NOTE — Progress Notes (Deleted)
UR chart review completed.  

## 2012-09-05 MED ORDER — IBUPROFEN 600 MG PO TABS: 600.0000 mg | ORAL_TABLET | Freq: Four times a day (QID) | ORAL | Status: DC

## 2012-09-05 MED ORDER — OXYCODONE-ACETAMINOPHEN 5-325 MG PO TABS: 1.0000 | ORAL_TABLET | ORAL | Status: DC | PRN

## 2012-09-05 NOTE — Progress Notes (Signed)
UR chart review completed.  

## 2012-09-05 NOTE — Discharge Summary (Signed)
Obstetric Discharge Summary Reason for Admission: onset of labor Prenatal Procedures: ultrasound Intrapartum Procedures: vacuum Postpartum Procedures: none Complications-Operative and Postpartum: none Hemoglobin  Date Value Range Status  09/04/2012 10.1* 12.0 - 15.0 g/dL Final     HCT  Date Value Range Status  09/04/2012 30.2* 36.0 - 46.0 % Final    Physical Exam:  General: alert and cooperative Lochia: appropriate Uterine Fundus: firm Incision: perineum intact DVT Evaluation: No evidence of DVT seen on physical exam. Negative Homan's sign. No cords or calf tenderness.  Discharge Diagnoses: Term Pregnancy-delivered  Discharge Information: Date: 09/05/2012 Activity: pelvic rest Diet: routine Medications: PNV, Ibuprofen, Percocet and wellbutrin and Buspar Condition: stable Instructions: refer to practice specific booklet Discharge to: home   Newborn Data: Live born female  Birth Weight: 6 lb 4.5 oz (2850 g) APGAR: 7, 8  Home with mother.  Merick Kelleher G 09/05/2012, 8:06 AM

## 2012-09-07 ENCOUNTER — Ambulatory Visit (HOSPITAL_COMMUNITY): Payer: PRIVATE HEALTH INSURANCE

## 2012-09-12 ENCOUNTER — Ambulatory Visit (HOSPITAL_COMMUNITY)
Admission: RE | Admit: 2012-09-12 | Discharge: 2012-09-12 | Disposition: A | Discharge status: Home / Self Care | Payer: PRIVATE HEALTH INSURANCE | Source: Ambulatory Visit | Attending: Obstetrics and Gynecology | Admitting: Obstetrics and Gynecology

## 2012-09-12 ENCOUNTER — Ambulatory Visit (HOSPITAL_COMMUNITY): Payer: PRIVATE HEALTH INSURANCE

## 2012-09-12 NOTE — Lactation Note (Signed)
Infant Lactation Consultation Outpatient Visit Note  Patient Name: [name] Date of Birth: 1985/04/28 Birth Weight:  6-4 oz Gestational Age at Delivery: 35 [name] Type of Delivery:   Breastfeeding History Frequency of Breastfeeding: Q 1-2 hours  Length of Feeding: 20=60 Voids: QS had void while here Stools: QS had yellow stool while here  Supplementing / Method: Pumping:  Type of Pump: Medela   Frequency: q 4 hours  6-7 times/day  Volume:  3-4 oz  Comments:    Consultation Evaluation: Mom reports that [name] will not latch without nipple shield. Mom reports that baby last fed at 12:30   Initial Feeding Assessment: Pre-feed Weight: 5- 10.2  2558g Post-feed Weight: 5-10.6  2570g Amount Transferred: 12 cc's Comments: Attempted to latch [name] without nipple shield. Mom has not pumped since 9:30 and breasts are pretty full. [name] took a few sucks but would not stay latched. Used nipple shield and he nursed on and off for 10 minutes then off to sleep. Mature milk noted in tip of nipple shield. Mom compressed breasts throughout the feeding. Swallows noted when she compressed breast.    Total Breast milk Transferred this Visit: 12cc from breast Total Supplement Given: 5 more from bottle  Additional Interventions: Parents report that they tried to feed every hour during the day yesterday so he would gain weight. Reports that he is very sleepy at most feedings. Encouraged to nurse q 2-3 hours and for 10- 15 minutes at most especially if he is very sleepy. Encouraged to watch for feeding cues and feed when they see them- he will probably feed better. Reassured it is OK to bottle feed [name] - our goal now is to make sure he is feeding and gaining weight. Reviewed normal preterm behavior Reassurance and praise given- he is only getting EBM, no formula. Encouraged mom to rest and take care of herself. No further questions at present. To call prn   Follow-Up  [facility] to come  tomorrow. To see Ped on Tues Op appointment here next Wed 09/19/12 at 2:30     Pamelia Hoit 09/12/2012, 3:21 PM

## 2012-09-19 ENCOUNTER — Ambulatory Visit (HOSPITAL_COMMUNITY): Admission: RE | Admit: 2012-09-19 | Payer: PRIVATE HEALTH INSURANCE | Source: Ambulatory Visit

## 2012-09-24 ENCOUNTER — Ambulatory Visit (HOSPITAL_COMMUNITY)
Admission: RE | Admit: 2012-09-24 | Discharge: 2012-09-24 | Disposition: A | Discharge status: Home / Self Care | Payer: PRIVATE HEALTH INSURANCE | Source: Ambulatory Visit | Attending: Obstetrics and Gynecology | Admitting: Obstetrics and Gynecology

## 2012-09-24 NOTE — Lactation Note (Signed)
Infant Lactation Consultation Outpatient Visit Note  Patient Name: JAHNIYAH REVERE Date of Birth: November 21, 1985 Birth Weight:   Gestational Age at Delivery: Gestational Age: <None> Type of Delivery:   Breastfeeding History Frequency of Breastfeeding: Mom reports that Dannielle Huh is doing better at the breast but she can only get him to latch on with the nipple shield.  Length of Feeding:  Voids: Lots Stools: Lots  Supplementing / Method: Pumping:  Type of Pump: MEdela   Frequency:6X/day  Volume:  10 oz  Comments:    Consultation Evaluation:  Initial Feeding Assessment: Pre-feed Weight:6-9.8 oz   3000g Post-feed Weight: 6-10.5    3018g Amount Transferred: 18 cc's Comments: [name] latched without the NS and nursed for 25 minutes Mom pleased he latched without the NS. Breast only slightly softer.  Additional Feeding Assessment: Pre-feed Weight: 6-10.5  3018g Post-feed Weight: 6-11.6  3050 Amount Transferred:32 cc's Comments:[name] latched without the NS and nursed for 20 minutes, then sleepy. Mom reports that nursing felt fine- no pain, only tugging. Pleased he had done so well without NS. [name] showing feeding cues while on scale. Mom bottle fed EBM.   Total Breast milk Transferred this Visit: 50 cc's Total Supplement Given: 60 cc's  Additional Interventions: Keep pumping until [name] is able to empty breast on his own. Great progress! No questions at present. To call prn.   Follow-Up With Ped at 2 months [facility] Can call here and make another OP appointment here if needed BFS[facility]    Pamelia Hoit 09/24/2012, 2:06 PM

## 2012-10-02 ENCOUNTER — Ambulatory Visit (INDEPENDENT_AMBULATORY_CARE_PROVIDER_SITE_OTHER): Payer: No Typology Code available for payment source | Admitting: Marriage and Family Therapist

## 2012-10-02 DIAGNOSIS — F411 Generalized anxiety disorder: Secondary | ICD-10-CM

## 2012-10-02 DIAGNOSIS — F988 Other specified behavioral and emotional disorders with onset usually occurring in childhood and adolescence: Secondary | ICD-10-CM

## 2012-10-02 NOTE — Progress Notes (Signed)
   THERAPIST PROGRESS NOTE  Session Time: 9:00 - 10:00 a.m.  Participation Level: Active  Behavioral Response: CasualAlertDepressed  Type of Therapy: Individual Therapy  Treatment Goals addressed: Coping  Interventions: Strength-based and Supportive  Summary: [name] is a 27 y.o. female who presents with anxiety and ADHD.  She was referred by Jorje Guild, PA.  Patient reports giving birth to her son five weeks early.  She reported the birth was very difficult in that she was in the hospital in labor for four days.  She also reports he has not been able to nurse given he was premature, causing nursing to be difficult.  Patient reports she has been "very depressed" since having the baby.  She did say her husband has been taking over doing a lot of what needs to be done allowing her to get some sleep.  Patient also reports her family has been coming over and holding the baby, staying for hours, and not really helping.  Suicidal/Homicidal: Nowithout intent/plan  Therapist Response:  Discussed patient having post-depression.  Patient was crying during the session.  Suggested she talk to others about how she is feeling (she is only talking to one girlfriend who had a baby in December and her husband).  Patient will make a sooner appointment with Jorje Guild for medication assessment than one month from now.  She will also make a sooner appointment with this writer than three weeks.  Patient will also make a list of needs and text family (her preferred method of communication) about needing help and being specific.  She will also tell the family that the couple needs more time to bond and to limit their time at her home.  Also suggested that if she has difficulty doing these tasks to ask her husband to help.  Plan: Return again in 3 weeks.  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

## 2012-10-09 ENCOUNTER — Ambulatory Visit (INDEPENDENT_AMBULATORY_CARE_PROVIDER_SITE_OTHER): Payer: PRIVATE HEALTH INSURANCE | Admitting: Physician Assistant

## 2012-10-09 VITALS — BP 113/79 | HR 67 | Ht 60.75 in | Wt 149.2 lb

## 2012-10-09 DIAGNOSIS — F411 Generalized anxiety disorder: Secondary | ICD-10-CM

## 2012-10-09 DIAGNOSIS — F988 Other specified behavioral and emotional disorders with onset usually occurring in childhood and adolescence: Secondary | ICD-10-CM

## 2012-10-09 DIAGNOSIS — O99345 Other mental disorders complicating the puerperium: Secondary | ICD-10-CM

## 2012-10-09 DIAGNOSIS — F3289 Other specified depressive episodes: Secondary | ICD-10-CM

## 2012-10-09 DIAGNOSIS — F329 Major depressive disorder, single episode, unspecified: Secondary | ICD-10-CM

## 2012-10-09 MED ORDER — BUSPIRONE HCL 15 MG PO TABS: 15.0000 mg | ORAL_TABLET | Freq: Three times a day (TID) | ORAL | Status: DC

## 2012-10-09 MED ORDER — BUPROPION HCL ER (XL) 300 MG PO TB24: 300.0000 mg | ORAL_TABLET | Freq: Every day | ORAL | Status: DC

## 2012-10-09 MED ORDER — TRAZODONE HCL 50 MG PO TABS: 25.0000 mg | ORAL_TABLET | Freq: Every day | ORAL | Status: DC

## 2012-10-11 ENCOUNTER — Encounter (HOSPITAL_COMMUNITY): Payer: Self-pay | Admitting: Physician Assistant

## 2012-10-11 NOTE — Progress Notes (Signed)
[facility] [id number] Progress Note  LAIKLYN PILKENTON [id number] 27 y.o.  10/09/2012 3:10 PM  Chief Complaint: Anxiety, insomnia, postpartum depression  History of Present Illness: [name] presents today to followup on her treatment for anxiety and ADHD. She reports that she gave birth to her son 5 weeks prematurely. She reports that she is attempting to breast feed, although her son has not begun to attach well yet because he is premature. She had gone into premature labor 9 weeks before full term, but they were able to stop that labor. She reports that things have been very overwhelming recently, and she is having a hard time coping. She states that her mood is "up and down." She is experiencing increased anxiety and has had some panic attacks. She endorses poor sleep, and states that it takes her about an hour to an hour and a half to fall asleep. Do to sleep deprivation, she is experiencing some visual hallucinations of movement in the corner of her eye. She denies any suicidal or homicidal ideation. She denies any auditory or visual hallucinations.  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 acetaminophen (TYLENOL) 500 MG tablet Take 500 mg by mouth daily as needed for pain.       Marland Kitchen buPROPion (WELLBUTRIN XL) 300 MG 24 hr tablet Take 1 tablet (300 mg total) by mouth daily.  90 tablet  0  . busPIRone (BUSPAR) 15 MG tablet Take 1 tablet (15 mg total) by mouth 3 (three) times daily.  270 tablet  0  . ibuprofen (ADVIL,MOTRIN) 600 MG tablet Take 1 tablet (600 mg total) by mouth every 6 (six) hours.  30 tablet  1  . oxyCODONE-acetaminophen (PERCOCET/ROXICET) 5-325 MG per tablet Take 1-2 tablets by mouth every 4 (four) hours as needed.  30 tablet  0  . Prenatal Vit-Fe Fumarate-FA (PRENATAL MULTIVITAMIN) TABS Take 1 tablet by mouth at bedtime.       . traZODone (DESYREL) 50 MG tablet Take 0.5-1 tablets (25-50 mg total) by mouth at bedtime.  30 tablet  1  . [DISCONTINUED] buPROPion (WELLBUTRIN XL) 300 MG 24 hr tablet Take 1 tablet (300 mg total) by mouth daily.  90 tablet  0  . [DISCONTINUED] busPIRone (BUSPAR) 15 MG tablet Take 1 tablet (15 mg total) by mouth 3 (three) times daily.  270 tablet  0   No facility-administered encounter medications on file as of 10/09/2012.    Past Psychiatric History/Hospitalization(s): Anxiety: Yes Bipolar Disorder: No Depression: Yes Mania: No Psychosis: No Schizophrenia: No Personality Disorder: No Hospitalization for psychiatric illness: No History of Electroconvulsive Shock Therapy: No Prior Suicide Attempts: No  Physical Exam: Constitutional:  BP 113/79  Pulse 67  Ht 5' 0.75" (1.543 m)  Wt 149 lb 3.2 oz (67.677 kg)  BMI 28.43 kg/m2  General Appearance: alert, oriented, no acute distress, well nourished and casually dressed  Musculoskeletal:  Strength & Muscle Tone: within normal limits Gait & Station: normal Patient leans: N/A  Psychiatric: Speech (describe rate, volume, coherence, spontaneity, and abnormalities if any): Clear and coherent with a real or rate and rhythm and normal volume  Thought Process (describe rate, content, abstract reasoning, and computation): Within normal limits  Associations: Intact  Thoughts:  normal  Mental Status: Orientation: oriented to person, place, time/date and situation Mood & Affect: anxiety and dysphoria with a constricted affect Attention Span & Concentration: Intact  Medical Decision Making (Choose Three): Established Problem, Worsening (2), Review of Medication Regimen & Side Effects (2) and Review of New Medication or Change in Dosage (2)  Assessment: Axis I: Generalized anxiety disorder, postpartum depression, ADHD inattentive type  Axis II: Deferred  Axis III: Good, HPV, rheumatoid arthritis  Axis IV: Moderate to severe  Axis V: 55   Plan: We will continue her Wellbutrin XL 300 mg daily, BuSpar 15 mg 3 times day, and try her on trazodone 25-50 mg at bedtime for sleep. She has been instructed to inform her obstetrician that she has been prescribed this medication to ensure his approval. She will return for followup in 2 months at my next available appointment. She is encouraged to call between appointments if concerns arise.  Sylar Voong, PA-C 10/11/2012

## 2012-10-23 ENCOUNTER — Ambulatory Visit (INDEPENDENT_AMBULATORY_CARE_PROVIDER_SITE_OTHER): Payer: PRIVATE HEALTH INSURANCE | Admitting: Marriage and Family Therapist

## 2012-10-23 DIAGNOSIS — F411 Generalized anxiety disorder: Secondary | ICD-10-CM

## 2012-10-23 DIAGNOSIS — F988 Other specified behavioral and emotional disorders with onset usually occurring in childhood and adolescence: Secondary | ICD-10-CM

## 2012-10-23 NOTE — Progress Notes (Signed)
   THERAPIST PROGRESS NOTE  Session Time:  10:00 - 11:00 a.m.  Participation Level: Active  Behavioral Response: CasualAlertAnxious and Depressed (mild)  Type of Therapy: Individual Therapy  Treatment Goals addressed: Coping  Interventions: Strength-based, Supportive and Family Systems  Summary: [name] is a 27 y.o. female who presents with anxiety, ADHD and recently depressed due to childbirth.  She was referred by Jorje Guild, PA.  Patient reports she has had less depression as things have been settling down with adjusting to a new son.  She reports there are times when she is in the house when her son is asleep on her chest when her mind starts wandering and depression sets in.  She reports she believes it is due to boredom and wants to get back to work.  She did start going back to the gym stating working out helps a lot.  She also reports having a close friend who has a five month-old baby who "really understands what I'm going through."  She reports having some problems with her husband wanting to feed the baby with a bottle leaving her having difficulty with breast feeding.  She reports they have tried to make dates but made one and their baby got sick and had to go to the hospital for four days.  Patient reports things are better with in-laws as both have talked to their own parents about limitations of being at their house for long periods of time.  Suicidal/Homicidal: Nowithout intent/plan  Therapist Response:  Assessed patient's depression.  Patient admits that her depression has decreased with only bouts of depression.  She also appears to know what she needs to do to get out of it based on reporting her self care.  Suggested that she and her husband take time for dates since they have the option of grandparents taking care of her new baby.  Suggested also that patient look in to the series "Baby and Me" provided by the [facility].  Suggested she and her husband go there  and to consider it a date.  Will continue to monitor patient's depression although as of right now it appears to be dissipating and situational.  Plan: Return again in 2 weeks.  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

## 2012-11-05 ENCOUNTER — Ambulatory Visit (HOSPITAL_COMMUNITY): Payer: Self-pay | Admitting: Physician Assistant

## 2012-11-13 ENCOUNTER — Ambulatory Visit (HOSPITAL_COMMUNITY): Payer: Self-pay | Admitting: Marriage and Family Therapist

## 2012-11-13 NOTE — Progress Notes (Unsigned)
   THERAPIST PROGRESS NOTE  Session Time:  10:00 - 11:00 a.m.  Participation Level: Did Not Attend  Behavioral Response:   Type of Therapy: Individual Therapy  Treatment Goals addressed: Coping  Interventions:   Summary: [name] is a 27 y.o. female who presents with anxiety and ADHD.  She was referred by Jorje Guild, PA.   Suicidal/Homicidal:  Therapist Response:    PATIENT CALLED IN STATING SHE WAS SICK.  Plan: Return again in 2 weeks.  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

## 2012-12-04 ENCOUNTER — Ambulatory Visit (INDEPENDENT_AMBULATORY_CARE_PROVIDER_SITE_OTHER): Payer: No Typology Code available for payment source | Admitting: Marriage and Family Therapist

## 2012-12-04 DIAGNOSIS — F411 Generalized anxiety disorder: Secondary | ICD-10-CM

## 2012-12-04 DIAGNOSIS — F988 Other specified behavioral and emotional disorders with onset usually occurring in childhood and adolescence: Secondary | ICD-10-CM

## 2012-12-04 NOTE — Progress Notes (Signed)
   THERAPIST PROGRESS NOTE  Session Time:  10:00- 11:00 a.m.  Participation Level: Active  Behavioral Response: CasualAlertAnxious/Overwhelmed  Type of Therapy: Individual Therapy  Treatment Goals addressed: Coping  Interventions: Strength-based and Supportive  Summary: [name] is a 27 y.o. female who presents with anxiety and ADHD.  She was referred by Jorje Guild, PA.  Patient reports she has been feeling anxious and overwhelmed.  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  She reports in the next few weeks her husband will be starting school which will make it even more stressful for her.  Patient did say "there is a light at the end of the tunnel" because when her husband graduates and starts working she will be able to cut down her hours.  Patient also states that she and her husband have been having some problems in their marriage but had a long talk and both realized it was because they were so focused on their son.  Patient admits since her late pregnancy and deliver as well as nursing has been difficult it has been a favor in feeling overwhelmed and disconnected to her husband as well.  Suicidal/Homicidal: Nowithout intent/plan  Therapist Response:  Discussed ways patient can alleviate her stress including the following:  Asking her mother to watch her son weekly so she and her husband can have a date; using a cleaning service that also does laundry every other week; consider getting her groceries through [employer] including delivery.  Also talked at length about how important it is for patient to take care of herself.  Also commended she and her husband for opening up to each other and the importance of keeping the relationship a priority.  Plan: Return again in 3 weeks.  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

## 2012-12-17 ENCOUNTER — Ambulatory Visit (HOSPITAL_COMMUNITY): Payer: Self-pay | Admitting: Physician Assistant

## 2012-12-27 ENCOUNTER — Ambulatory Visit (HOSPITAL_COMMUNITY): Payer: Self-pay | Admitting: Marriage and Family Therapist

## 2013-01-03 ENCOUNTER — Ambulatory Visit (INDEPENDENT_AMBULATORY_CARE_PROVIDER_SITE_OTHER): Payer: No Typology Code available for payment source | Admitting: Marriage and Family Therapist

## 2013-01-03 DIAGNOSIS — F988 Other specified behavioral and emotional disorders with onset usually occurring in childhood and adolescence: Secondary | ICD-10-CM

## 2013-01-03 DIAGNOSIS — F411 Generalized anxiety disorder: Secondary | ICD-10-CM
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
## 2013-01-15 ENCOUNTER — Ambulatory Visit (HOSPITAL_COMMUNITY): Payer: Self-pay | Admitting: Physician Assistant

## 2013-01-17 ENCOUNTER — Ambulatory Visit (INDEPENDENT_AMBULATORY_CARE_PROVIDER_SITE_OTHER): Payer: PRIVATE HEALTH INSURANCE | Admitting: Marriage and Family Therapist

## 2013-01-17 DIAGNOSIS — F411 Generalized anxiety disorder: Secondary | ICD-10-CM

## 2013-01-17 DIAGNOSIS — F988 Other specified behavioral and emotional disorders with onset usually occurring in childhood and adolescence: Secondary | ICD-10-CM
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
## 2013-01-23 ENCOUNTER — Encounter (HOSPITAL_COMMUNITY): Payer: Self-pay | Admitting: Physician Assistant

## 2013-01-23 ENCOUNTER — Ambulatory Visit (INDEPENDENT_AMBULATORY_CARE_PROVIDER_SITE_OTHER): Payer: PRIVATE HEALTH INSURANCE | Admitting: Physician Assistant

## 2013-01-23 VITALS — BP 130/76 | HR 89 | Ht 60.75 in | Wt 138.0 lb

## 2013-01-23 DIAGNOSIS — O99345 Other mental disorders complicating the puerperium: Secondary | ICD-10-CM

## 2013-01-23 DIAGNOSIS — F988 Other specified behavioral and emotional disorders with onset usually occurring in childhood and adolescence: Secondary | ICD-10-CM

## 2013-01-23 DIAGNOSIS — F3289 Other specified depressive episodes: Secondary | ICD-10-CM

## 2013-01-23 DIAGNOSIS — F329 Major depressive disorder, single episode, unspecified: Secondary | ICD-10-CM

## 2013-01-23 DIAGNOSIS — F411 Generalized anxiety disorder: Secondary | ICD-10-CM

## 2013-01-23 MED ORDER — BUSPIRONE HCL 15 MG PO TABS: 15.0000 mg | ORAL_TABLET | Freq: Three times a day (TID) | ORAL | Status: DC

## 2013-01-23 MED ORDER — BUPROPION HCL ER (XL) 300 MG PO TB24: 300.0000 mg | ORAL_TABLET | Freq: Every day | ORAL | Status: DC

## 2013-01-23 MED ORDER — SERTRALINE HCL 50 MG PO TABS: 50.0000 mg | ORAL_TABLET | Freq: Every day | ORAL | Status: DC

## 2013-01-23 NOTE — Progress Notes (Signed)
[facility] [id number] Progress Note  [name] [id number] 27 y.o.  01/23/2013 3:01 PM  Chief Complaint: Increasing anxiety and depression with psychotic features  History of Present Illness: [name] presents today to followup on her treatment for anxiety. She reports that for the past 2 months that she has been feeling very anxious, especially after having return to work. Reports that with her infant son she is suffering from sleep deprivation. She reports that she has been having visual hallucinations of movement in the corner of her eye, as well as tactile hallucinations like something is on her period she is currently sleeping between 2 and 6 hours per night. She is working third shift, while her husband works first shift.   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 acetaminophen (TYLENOL) 500 MG tablet Take 500 mg by mouth daily as needed for pain.      Marland Kitchen buPROPion (WELLBUTRIN XL) 300 MG 24 hr tablet Take 1 tablet (300 mg total) by mouth daily.  90 tablet  0  . busPIRone (BUSPAR) 15 MG tablet Take 1 tablet (15 mg total) by mouth 3 (three) times daily.  270 tablet  0  . ibuprofen (ADVIL,MOTRIN) 600 MG tablet Take 1 tablet (600 mg total) by mouth every 6 (six) hours.  30  tablet  1  . oxyCODONE-acetaminophen (PERCOCET/ROXICET) 5-325 MG per tablet Take 1-2 tablets by mouth every 4 (four) hours as needed.  30 tablet  0  . Prenatal Vit-Fe Fumarate-FA (PRENATAL MULTIVITAMIN) TABS Take 1 tablet by mouth at bedtime.       . sertraline (ZOLOFT) 50 MG tablet Take 1 tablet (50 mg total) by mouth daily.  30 tablet  0  . traZODone (DESYREL) 50 MG tablet Take 0.5-1 tablets (25-50 mg total) by mouth at bedtime.  30 tablet  1  . [DISCONTINUED] buPROPion (WELLBUTRIN XL) 300 MG 24 hr tablet Take 1 tablet (300 mg total) by mouth daily.  90 tablet  0  . [DISCONTINUED] busPIRone (BUSPAR) 15 MG tablet Take 1 tablet (15 mg total) by mouth 3 (three) times daily.  270 tablet  0   No facility-administered encounter medications on file as of 01/23/2013.    Past Psychiatric History/Hospitalization(s): Anxiety: Yes Bipolar Disorder: No Depression: Yes Mania: No Psychosis: No Schizophrenia: No Personality Disorder: No Hospitalization for psychiatric illness: No History of Electroconvulsive Shock Therapy: No Prior Suicide Attempts: No  Physical Exam: Constitutional:  BP 130/76  Pulse 89  Ht 5' 0.75" (1.543 m)  Wt 138 lb (62.596 kg)  BMI 26.29 kg/m2  General Appearance: alert, oriented, no acute distress, well nourished and casually dressed  Musculoskeletal: Strength & Muscle Tone: within normal limits Gait & Station: normal Patient leans: N/A  Psychiatric: Speech (describe rate, volume, coherence, spontaneity, and abnormalities if any): Clear and coherent at 80 regular rate and rhythm and normal volume  Thought Process (describe rate, content, abstract reasoning, and computation): Within normal limits  Associations: Intact  Thoughts: normal  Mental Status: Orientation: oriented to person, place, time/date and situation Mood & Affect: normal affect Attention Span & Concentration: Intact  Medical Decision Making (Choose Three): Review of Psycho-Social Stressors  (1), Established Problem, Worsening (2), Review of Medication Regimen & Side Effects (2) and Review of New Medication or Change in Dosage (2)  Assessment: Axis I: Generalized anxiety disorder, postpartum depression, ADHD inattentive type  Axis II: Deferred  Axis III: GERD, HPV, rheumatoid arthritis   Axis IV: Moderate  Axis V: 50   Plan: We will start her on Zoloft 50 mg daily and she will return for followup in four-week 6. If improvement is not seen we will consider a mild antipsychotic medication such as Geodon as long as it is safe for lactation. We will continue her Wellbutrin XL 300 mg daily, BuSpar 15 mg 3 times daily, and trazodone 25-50 mg at bedtime as needed.  Calin Fantroy, PA-C 01/23/2013

## 2013-02-06 ENCOUNTER — Ambulatory Visit (HOSPITAL_COMMUNITY): Payer: Self-pay | Admitting: Marriage and Family Therapist

## 2013-02-07 ENCOUNTER — Ambulatory Visit (HOSPITAL_COMMUNITY): Payer: Self-pay | Admitting: Marriage and Family Therapist

## 2013-02-20 ENCOUNTER — Ambulatory Visit (HOSPITAL_COMMUNITY): Payer: No Typology Code available for payment source | Admitting: Physician Assistant

## 2013-02-20 ENCOUNTER — Encounter (HOSPITAL_COMMUNITY): Payer: Self-pay | Admitting: Physician Assistant

## 2013-02-20 MED ORDER — SERTRALINE HCL 100 MG PO TABS: 100.0000 mg | ORAL_TABLET | Freq: Every day | ORAL | Status: DC

## 2013-02-28 ENCOUNTER — Other Ambulatory Visit: Payer: Self-pay

## 2013-03-05 ENCOUNTER — Ambulatory Visit (INDEPENDENT_AMBULATORY_CARE_PROVIDER_SITE_OTHER): Payer: PRIVATE HEALTH INSURANCE | Admitting: Marriage and Family Therapist

## 2013-03-05 DIAGNOSIS — F411 Generalized anxiety disorder: Secondary | ICD-10-CM

## 2013-03-05 DIAGNOSIS — F988 Other specified behavioral and emotional disorders with onset usually occurring in childhood and adolescence: Secondary | ICD-10-CM

## 2013-03-05 NOTE — Progress Notes (Signed)
   THERAPIST PROGRESS NOTE  Session Time:  10:00 - 11:00 p.m.  Participation Level: Active  Behavioral Response: CasualAlertAnxious (mild)  Type of Therapy: Individual Therapy  Treatment Goals addressed: Coping  Interventions: Strength-based and Supportive  Summary: [name] is a 27 y.o. female who presents with anxiety and ADHD.  She was referred by Jorje Guild, PA.  Patient reports she has been on Zoloft for post-partum depression and "it is making a positive difference in how I feel."  She reports Jorje Guild diagnosed her with post-partum depression.  Patient states she is feeling more alert, has been getting sleep including sleeping with her son sleeps.  She reports she has been able to look forward to other things including getting work done around her house.  She reports she is enjoying having a son and that her relationship with her husband has been good.  She reports there are stressors at work but none where she thinks about quitting her job "at the moment."  Patient did say she wants to find out how she can stop ruminating at times about a suspicion that her husband is lying to her relating to "being with other women."  She reports this is an old fear about his past behavior and there is no reason why she should be suspicious.  Patient states her suspicions are unfounded.  Suicidal/Homicidal: NA  Therapist Response:  Assessed patient's anxiety and depression.  Based on patient being animated and talkative (patient tends to have difficulty opening up communication-wise) and patient acknowledging she was feeling better "due to the Zoloft."  Discussed patient's ruminating about husband lying to her about women and patient was taking responsibility for "this being my problem, not his problem."  Suggested patient observe what is happening to her when she has these fears e.g., does it happen at times when she is tired, feeling overwhelmed, paying attention to body sensations.  Also talked  about patient asking her husband how he can work with her to resolve the problem e.g., does she need a hug, and to tell him she is working on solving the problem.  Also pointed out to patient with discussion that this issue was becoming less and less for her as time went on.  Plan: Return again in 3 weeks.  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    Axis II: Deferred    Cristle Jared, LMFT, CTS 03/05/2013

## 2013-03-27 ENCOUNTER — Ambulatory Visit (INDEPENDENT_AMBULATORY_CARE_PROVIDER_SITE_OTHER): Payer: No Typology Code available for payment source | Admitting: Marriage and Family Therapist

## 2013-03-27 DIAGNOSIS — F329 Major depressive disorder, single episode, unspecified: Secondary | ICD-10-CM

## 2013-03-27 DIAGNOSIS — F411 Generalized anxiety disorder: Secondary | ICD-10-CM

## 2013-03-27 DIAGNOSIS — F988 Other specified behavioral and emotional disorders with onset usually occurring in childhood and adolescence: Secondary | ICD-10-CM

## 2013-03-27 DIAGNOSIS — F3289 Other specified depressive episodes: Secondary | ICD-10-CM

## 2013-03-27 NOTE — Progress Notes (Signed)
   THERAPIST PROGRESS NOTE  Session Time:  10:00 - 11:00 a.m.  Participation Level: Active  Behavioral Response: CasualAlertAnxious and Depressed  Type of Therapy: Individual Therapy  Treatment Goals addressed: Coping  Interventions: Strength-based and Supportive  Summary: [name] is a 27 y.o. female who presents with depression, anxiety, and ADHD.  She was referred by Jorje Guild, PA. Patient reports she has continued to feel better since she was put on an antidepressant.  She reports it was a "vicious cycle" having depression, anxiety, and having difficulty communicating with others due to the symptoms.  She reports she has been able to management working two jobs, not having husband available as much due to his school course load, and having help from relatives to take care of her son.  She reports her mother has been taking care of her son when patient is working and that their relationship has improved as a result.  Suicidal/Homicidal: NA  Therapist Response:  Assessed patient's anxiety and depression.  Other than patient's report of improvement, she was very communicative, body language was animated.  Discussed how she has been able to cope versus past bouts with depression and anxiety.  Also discussed her upcoming visit with Dr. Daleen Bo.  Discussed patient discussing with Jorje Guild his wanting to ween her off of Buspar since she is now taking Wellbutrin and to discuss same with Dr. Daleen Bo.  Plan: Return again in 3 weeks.  Will continue to see patient and assess how she is doing based on patient only having a decrease in symptoms for two months.  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

## 2013-03-29 ENCOUNTER — Ambulatory Visit (INDEPENDENT_AMBULATORY_CARE_PROVIDER_SITE_OTHER): Payer: No Typology Code available for payment source | Admitting: Psychiatry

## 2013-03-29 ENCOUNTER — Encounter (HOSPITAL_COMMUNITY): Payer: Self-pay | Admitting: Psychiatry

## 2013-03-29 DIAGNOSIS — F3289 Other specified depressive episodes: Secondary | ICD-10-CM

## 2013-03-29 DIAGNOSIS — F411 Generalized anxiety disorder: Secondary | ICD-10-CM

## 2013-03-29 DIAGNOSIS — F329 Major depressive disorder, single episode, unspecified: Secondary | ICD-10-CM

## 2013-03-29 MED ORDER — SERTRALINE HCL 100 MG PO TABS: 100.0000 mg | ORAL_TABLET | Freq: Every day | ORAL | Status: DC

## 2013-03-29 MED ORDER — BUPROPION HCL ER (XL) 300 MG PO TB24: 300.0000 mg | ORAL_TABLET | Freq: Every day | ORAL | Status: DC
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
## 2013-04-17 ENCOUNTER — Ambulatory Visit (HOSPITAL_COMMUNITY): Payer: Self-pay | Admitting: Marriage and Family Therapist

## 2013-04-22 ENCOUNTER — Ambulatory Visit (INDEPENDENT_AMBULATORY_CARE_PROVIDER_SITE_OTHER): Payer: No Typology Code available for payment source | Admitting: Marriage and Family Therapist

## 2013-04-22 DIAGNOSIS — F411 Generalized anxiety disorder: Secondary | ICD-10-CM

## 2013-04-22 DIAGNOSIS — F329 Major depressive disorder, single episode, unspecified: Secondary | ICD-10-CM

## 2013-04-22 DIAGNOSIS — F3289 Other specified depressive episodes: Secondary | ICD-10-CM

## 2013-04-22 DIAGNOSIS — F988 Other specified behavioral and emotional disorders with onset usually occurring in childhood and adolescence: Secondary | ICD-10-CM

## 2013-04-22 NOTE — Progress Notes (Signed)
   THERAPIST PROGRESS NOTE  Session Time:  1:00 - 2:00 p.m.  Participation Level: Active  Behavioral Response: CasualAlertAnxious and Depressed (mild)  Type of Therapy: Individual Therapy  Treatment Goals addressed: Coping  Interventions: Strength-based and Supportive  Summary: KRISTINIA LEAVY is a 27 y.o. female who presents with depression, anxiety, and ADHD.   She was referred by Jorje Guild, PA.  Patient reports she has continued to experience less depression and anxiety.  She reports that although she is still working two jobs her husband has been off from school allowing her to get more rest than usual.  She reports feeling "fatigued" but does not know why.  She did reports work has been more active than usual due to flu season.  Patient states Christmas was good with her new son.  She did reports "having a lot of feelings" about her father having Alzheimer's Disease and has been experiencing her father's memory getting worse and worse.  She reports this situation "makes her cry" and she does not know how to cope with him to help him.  She reports seeing Dr. Daleen Bo for the first time and the doctor is following Hessie Diener Watt's recommendation to titrated patient off of Buspar, which patient has started to do.  Suicidal/Homicidal: NA - Patient was not assessed at this session for SI/HI.    Therapist Response:  Assessed for both depression and anxiety.  Patient states she has been able cope better than she has in a long time, even with a busy schedule.  Talked about how patient is coping at work.  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  Patient was able to identify the difference.  Talked about how patient can cope with her father having Alzheimer's Disease.  Will talk next time with patient about slowing down her session if she continues to improve.  Plan: Return again in  2 weeks.  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

## 2013-05-14 ENCOUNTER — Ambulatory Visit (INDEPENDENT_AMBULATORY_CARE_PROVIDER_SITE_OTHER): Payer: No Typology Code available for payment source | Admitting: Marriage and Family Therapist

## 2013-05-14 DIAGNOSIS — F329 Major depressive disorder, single episode, unspecified: Secondary | ICD-10-CM

## 2013-05-14 DIAGNOSIS — F988 Other specified behavioral and emotional disorders with onset usually occurring in childhood and adolescence: Secondary | ICD-10-CM

## 2013-05-14 DIAGNOSIS — F3289 Other specified depressive episodes: Secondary | ICD-10-CM

## 2013-05-14 DIAGNOSIS — F411 Generalized anxiety disorder: Secondary | ICD-10-CM

## 2013-05-14 NOTE — Progress Notes (Signed)
   THERAPIST PROGRESS NOTE  Session Time:  2:00 - 3:00 p.m.  Participation Level: Active  Behavioral Response: CasualAlertDepressed  Type of Therapy: Individual Therapy  Treatment Goals addressed: Coping  Interventions: Strength-based, Supportive and Family Systems  Summary: [name] is a 28 y.o. female who presents with depression, anxiety, and ADHD.  She was referred by Jorje Guild, PA.  Patient reported she has had an increase in her depression.  She reported.  Patient states she believes it is because of her relationship with her husband.  She reports he has been feeling stress about school and "has been taking it out on me."  Patient states she has confronted him and he has said he is sorry but she is "worried" because she does not feel attracted to him and wonders if she will "never be attracted to him again."  She reports she is working a lot and "it seems like I do not see much of my family."  Patient also stated she has had a problem in the past with pulling out her eye lashes and has recently began to pull them out again.  Suicidal/Homicidal: NA  Therapist Response:   Assessed patient's depression.  She reports her depression was a "2-3" on 0-10 scale but lately it has been about a "4-5."  Patient was able to identify why her depression has increased with little discussion.  Talked about the dynamics with couples where there are times when one does not feel attracted to the other but they still love each other.  Also discussed this as normal and that how she is feeling will pass.  Told her she was doing the right thing by confronting him.  Also talked about the fact that patient states she understands why he is depressed and talked about those reasons in session.  Also talked about patient pulling out her eyelashes, what that is, and that she needs to find a way to stop doing this behavior.  She reports believing that her eye lashes are her biggest asset and she will keep telling  herself that which may help her stop.  Will check in on this behavior next session.  Plan: Return again in 3 weeks.  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

## 2013-05-27 ENCOUNTER — Ambulatory Visit (INDEPENDENT_AMBULATORY_CARE_PROVIDER_SITE_OTHER): Payer: PRIVATE HEALTH INSURANCE | Admitting: Family Medicine

## 2013-05-27 ENCOUNTER — Encounter: Payer: Self-pay | Admitting: Family Medicine

## 2013-05-27 VITALS — BP 104/60 | HR 118 | Temp 99.4°F | Ht 60.75 in | Wt 140.0 lb

## 2013-05-27 DIAGNOSIS — J019 Acute sinusitis, unspecified: Secondary | ICD-10-CM

## 2013-05-27 MED ORDER — HYDROCODONE-HOMATROPINE 5-1.5 MG/5ML PO SYRP: 5.0000 mL | ORAL_SOLUTION | ORAL | Status: DC | PRN

## 2013-05-27 MED ORDER — AMOXICILLIN-POT CLAVULANATE 875-125 MG PO TABS: 1.0000 | ORAL_TABLET | Freq: Two times a day (BID) | ORAL | Status: DC

## 2013-05-27 NOTE — Progress Notes (Signed)
Pre visit review using our clinic review tool, if applicable. No additional management support is needed unless otherwise documented below in the visit note. 

## 2013-05-27 NOTE — Progress Notes (Signed)
   Subjective:    Patient ID: [name], female    DOB: March 29, 1986, 28 y.o.   MRN: [id number]  HPI Here for 3 weeks of sinus pressure, PND, chest tightness and coughing up green sputum. No fever.    Review of Systems  Constitutional: Negative.   HENT: Positive for congestion, postnasal drip and sinus pressure.   Eyes: Negative.   Respiratory: Positive for cough.        Objective:   Physical Exam  Constitutional: She appears well-developed and well-nourished.  HENT:  Right Ear: External ear normal.  Left Ear: External ear normal.  Nose: Nose normal.  Mouth/Throat: Oropharynx is clear and moist.  Eyes: Conjunctivae are normal.  Pulmonary/Chest: Effort normal and breath sounds normal.  Lymphadenopathy:    She has no cervical adenopathy.          Assessment & Plan:  Add Mucinex

## 2013-05-30 ENCOUNTER — Ambulatory Visit (INDEPENDENT_AMBULATORY_CARE_PROVIDER_SITE_OTHER): Payer: PRIVATE HEALTH INSURANCE | Admitting: Psychiatry

## 2013-05-30 VITALS — BP 124/75 | HR 95 | Ht 60.25 in | Wt 140.0 lb

## 2013-05-30 DIAGNOSIS — F3289 Other specified depressive episodes: Secondary | ICD-10-CM

## 2013-05-30 DIAGNOSIS — F411 Generalized anxiety disorder: Secondary | ICD-10-CM

## 2013-05-30 DIAGNOSIS — F329 Major depressive disorder, single episode, unspecified: Secondary | ICD-10-CM

## 2013-05-30 MED ORDER — BUPROPION HCL ER (XL) 150 MG PO TB24: 150.0000 mg | ORAL_TABLET | Freq: Every day | ORAL | Status: DC

## 2013-05-30 MED ORDER — SERTRALINE HCL 100 MG PO TABS: 100.0000 mg | ORAL_TABLET | Freq: Every day | ORAL | Status: DC

## 2013-05-30 NOTE — Progress Notes (Signed)
[facility] [id number] Progress Note  [name] [id number] 28 y.o.  05/30/2013 9:13 AM  Chief Complaint: "sleep issues, anxiety"  History of Present Illness: Patient is a 28 yo WF, married with a 70 month old son. She has been diagnosed with Anxiety and Depression and been in treatment at [facility] for the past few years. At her last appointment patient's BuSpar was tapered. She reports doing well off the BuSpar. Has not Noticed any increase in her symptoms. She is currently taking Zoloft at 100mg  and wellbutrin 300mg  po qd. She reports the Zoloft has helped a lot. She reports more recently she is having trouble sleeping. She is able to go to bed but it takes her about 1-2 hours to actually fall asleep. Has also been anxious at work, states she feels paranoid easily and thinks other people may be thinking badly about her. Bothers her to be around a lot of people  She reports the relationship with her husband has improved. Mood has significantly improved. States her anxiety continues to bother her. States her stressors are her work (she is an ), being around a lot of people.  Patient reports that she works four shifts a week as a and that she is sleep deprived. She works from 3 PM to 3 AM. She would like to spend more time with her son in the future reports that her current husband is currently in school and once he gets a job she will contract her working hours.  Suicidal Ideation: No Plan Formed: No Patient has means to carry out plan: No  Homicidal Ideation: No Plan Formed: No Patient has means to carry out plan: No  Review of Systems: Psychiatric: Agitation: No Hallucination: No Depressed Mood: No Insomnia: Yes Hypersomnia: No Altered Concentration: No Feels Worthless: No Grandiose Ideas: No Belief In Special Powers: No New/Increased Substance Abuse: No Compulsions: No  Neurologic: Headache: No Seizure: No Paresthesias: No  Past Medical  Family, Social History: Currently has bronchitis. Family history of anxiety and depression with biological father. 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  Outpatient Encounter Prescriptions as of 05/30/2013  Medication Sig  . acetaminophen (TYLENOL) 500 MG tablet Take 500 mg by mouth daily as needed for pain.  [employer] amoxicillin-clavulanate (AUGMENTIN) 875-125 MG per tablet Take 1 tablet by mouth 2 (two) times daily.  07/28/2013 buPROPion (WELLBUTRIN XL) 300 MG 24 hr tablet Take 1 tablet (300 mg total) by mouth daily.  Marland Kitchen HYDROcodone-homatropine (HYDROMET) 5-1.5 MG/5ML syrup Take 5 mLs by mouth every 4 (four) hours as needed for cough.  . hydroxychloroquine (PLAQUENIL) 200 MG tablet Take 200 mg by mouth daily.  . norethindrone (CAMILA) 0.35 MG tablet Take 1 tablet by mouth daily.  . predniSONE (DELTASONE) 5 MG tablet Take 5 mg by mouth daily with breakfast.  . Prenatal Vit-Fe Fumarate-FA (PRENATAL MULTIVITAMIN) TABS Take 1 tablet by mouth at bedtime.   . sertraline (ZOLOFT) 100 MG tablet Take 1 tablet (100 mg total) by mouth daily.    Past Psychiatric History/Hospitalization(s): Anxiety: Yes Bipolar Disorder: No Depression: No Mania: No Psychosis: No Schizophrenia: No Personality Disorder: No Hospitalization for psychiatric illness: No History of Electroconvulsive Shock Therapy: No Prior Suicide Attempts: No  Physical Exam: Constitutional:  BP 124/75  Pulse 95  Ht 5' 0.25" (1.53 m)  Wt 140 lb (63.504 kg)  BMI 27.13 kg/m2  General Appearance: alert, oriented, no acute distress  Musculoskeletal: Strength & Muscle Tone: within normal limits Gait &  Station: normal Patient leans: N/A  Psychiatric: Speech (describe rate, volume, coherence, spontaneity, and abnormalities if any): normal rate  Thought Process (describe rate, content, abstract reasoning, and computation): normal  Associations: Coherent  Thoughts: normal  Mental Status: Orientation: oriented to person, place, time/date  and situation Mood & Affect: normal affect Attention Span & Concentration: fair  Medical Decision Making (Choose Three): Established Problem, Stable/Improving (1), Review of Psycho-Social Stressors (1) and Review of Medication Regimen & Side Effects (2)  Assessment: Axis I: GAD, Depression  Axis II: deferred  Axis III: Bronchitis  Axis IV: anxiety, work stress  Axis V: GAF of 75   Plan: Anxiety: Continue Zoloft at 100 mg daily Decrease Wellbutrin to 150 mg daily. Educated patient about the probability of Wellbutrin stimulating and contributing to her  difficulty in sleeping Insomnia: Discussed sleep hygiene techniques with patient Start melatonin at 1-2 mg at night Discussed exercise and meditation techniques to help with stress at work. More than half of the session was spent in counseling.  Patrick North, MD 05/30/2013

## 2013-06-11 ENCOUNTER — Ambulatory Visit (HOSPITAL_COMMUNITY): Payer: Self-pay | Admitting: Marriage and Family Therapist

## 2013-06-19 ENCOUNTER — Ambulatory Visit (INDEPENDENT_AMBULATORY_CARE_PROVIDER_SITE_OTHER): Payer: PRIVATE HEALTH INSURANCE | Admitting: Marriage and Family Therapist

## 2013-06-19 DIAGNOSIS — F988 Other specified behavioral and emotional disorders with onset usually occurring in childhood and adolescence: Secondary | ICD-10-CM

## 2013-06-19 DIAGNOSIS — F331 Major depressive disorder, recurrent, moderate: Secondary | ICD-10-CM

## 2013-06-19 NOTE — Progress Notes (Signed)
   THERAPIST PROGRESS NOTE  Session Time:  10:00 - 11:00 a.m.  Participation Level: Active  Behavioral Response: CasualAlertAnxious  (mild)  Type of Therapy: Individual Therapy  Treatment Goals addressed: Coping  Interventions: Strength-based, Supportive and Family Systems  Summary: [name] is a 28 y.o. female who presents with depression and anxiety.  She was referred by Jorje Guild, PA.Marland Kitchen  Patient reports overall she has been feeling okay.  She reports still having some anxiety but that her depression has been stable for some time since Zoloft was added to the Wellbutrin for depression.  She did admit she has been emotionally distancing herself from her husband but does not know why.  She did say at times she does not feel attracted to him because he has been stressed about school and not handling it well but also "because has gained weight.  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  She also talked about adjusting to a new psychiatrist.  Patient brought her nine-month old son in the session with her.    Suicidal/Homicidal: No  Therapist Response:   Assessed patient's depression and through discussion patient appears to be more "flat in affect" than she is depressed.  She gave no examples of actually feeling depressed.  Talked about possibilities including patient being a new mother, the time of year, she and her husband not spending enough time with each other as possibilities.  Also talked about the stressors of patient working two jobs and raising a young child.  Recommended patient talk to Dr. Daleen Bo about how she is feeling decreasing the Zoloft.  Talked about patient being on two anti-depressants when she was in crisis months ago and she is now stable, leaving her the possibility to decrease her medication through Dr. Daleen Bo.  Patient also states she "does not mind having the  Xanax" for her anxiety and is trying to cope without it.  Also recommended both patient and husband try to find ways to spend time together despite their schedules.  Helped patient normalize her feelings about her husband at this time but also cautioned her that the couple needed to "give some energy" to their marriage.  Plan: Return again in 3 weeks.  Diagnosis: Axis I: Major depressive d/o, moderate;; GAD    Axis II: Deferred    Oswell Say, LMFT, CTS 06/19/2013

## 2013-07-10 ENCOUNTER — Ambulatory Visit (HOSPITAL_COMMUNITY): Payer: Self-pay | Admitting: Marriage and Family Therapist

## 2013-07-15 ENCOUNTER — Telehealth (HOSPITAL_COMMUNITY): Payer: Self-pay | Admitting: *Deleted

## 2013-07-15 ENCOUNTER — Ambulatory Visit (INDEPENDENT_AMBULATORY_CARE_PROVIDER_SITE_OTHER): Payer: PRIVATE HEALTH INSURANCE | Admitting: Marriage and Family Therapist

## 2013-07-15 DIAGNOSIS — F332 Major depressive disorder, recurrent severe without psychotic features: Secondary | ICD-10-CM

## 2013-07-15 NOTE — Telephone Encounter (Signed)
Pt stopped in after her therapy appointment to ask if this writer would send a note to Dr. Daleen Bo. States she decreased her Wellbutrin over 1 month ago and is not doing well on the decreased dosage. Has noticed an increase in Depression and would like to go back to original dosage.

## 2013-07-15 NOTE — Progress Notes (Signed)
   THERAPIST PROGRESS NOTE  Session Time:  10:00 - 11:00 a.m.  Participation Level: Active  Behavioral Response: CasualDrowsyDepressed  Type of Therapy: Individual Therapy  Treatment Goals addressed: Coping  Interventions: Strength-based and Supportive  Summary: [name] is a 28 y.o. female who presents with depression, anxiety, and ADHD.  She was referred by Jorje Guild, PA.Marland Kitchen  Patient reports she has been depressed about 10 days.  She reports feeling "so depressed she does not want to get out of bed or play with my 53 month old son."  She reports she also has not been working her second job due to the depression.  Patient states it is because of the decrease in her Wellbutrin that she stopped taking about 24 days ago.  Patient also reports she has been good eliminating her Buspar and has had no repercussion eliminating the Buspar.  Patient talked about her relationship with her husband.  She reports feeling happy that they have such a good marriage since they have so many stressors.  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  She says she "sees light at the end of the tunnel" concerning her husband eventually having a job.  She did say that she wants to take her son's crib out of their room because they have no time to be alone to "talk about things" without their son being present, something her husband tells her he is not ready to do.  She also reports planning a cruise for May to "keep the relationship going."  Suicidal/Homicidal: Negative  Therapist Response:   Assessed patient's depression.  Patient reports over the past 10 days her depression has been an "8" out of 10 and includes significant fatigue.  She states she has "never not wanted to play with my son" because of depression.  Had patient talk to our nurse about this decline in depression.  She reports she  got back on the Wellbutrin two days ago and has an appointment with the doctor on 4/1.  Discussed at length how patient is becoming assertive with her husband relating to her needs and the needs of the marriage.  Plan: Return again in 3 weeks.  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

## 2013-07-31 ENCOUNTER — Ambulatory Visit (INDEPENDENT_AMBULATORY_CARE_PROVIDER_SITE_OTHER): Payer: PRIVATE HEALTH INSURANCE | Admitting: Psychiatry

## 2013-07-31 VITALS — BP 89/57 | HR 85 | Ht 60.0 in | Wt 140.6 lb

## 2013-07-31 DIAGNOSIS — F329 Major depressive disorder, single episode, unspecified: Secondary | ICD-10-CM

## 2013-07-31 DIAGNOSIS — F411 Generalized anxiety disorder: Secondary | ICD-10-CM

## 2013-07-31 DIAGNOSIS — F3289 Other specified depressive episodes: Secondary | ICD-10-CM

## 2013-07-31 MED ORDER — BUPROPION HCL ER (XL) 300 MG PO TB24: 300.0000 mg | ORAL_TABLET | Freq: Every day | ORAL | Status: DC

## 2013-07-31 MED ORDER — SERTRALINE HCL 100 MG PO TABS: 100.0000 mg | ORAL_TABLET | Freq: Every day | ORAL | Status: DC

## 2013-07-31 NOTE — Progress Notes (Signed)
Patient ID: JESSEL GETTINGER, female   DOB: [birth year], 28 y.o.   MRN: [id number]  [facility] [id number] Progress Note  MERI PELOT [id number] 28 y.o.  07/31/2013 9:19 AM  Chief Complaint: "stress, anxiety"  History of Present Illness: Patient is a 28 yo WF, married with a 85 month old son. She has been diagnosed with Anxiety and Depression and been in treatment at [facility] for the past few years. She is currently taking Zoloft at 100mg  and wellbutrin 150mg  po qd. The Wellbutrin was tapered to 150mg  last visit in an effort to minimize medications and have patient on a maintenance schedule. She reports becoming more depressed on the decreased dose of Wellbutrin and went back upto 300mg . States she is feeling much better.She reports the Zoloft has helped a lot. She reports sleeping better recently. Continuous to be stressed about work, gets anxious around people. She reports the relationship with her husband has improved. Mood has significantly improved.   Suicidal Ideation: No Plan Formed: No Patient has means to carry out plan: No  Homicidal Ideation: No Plan Formed: No Patient has means to carry out plan: No  Review of Systems: Psychiatric: Agitation: No Hallucination: No Depressed Mood: No Insomnia: Yes Hypersomnia: No Altered Concentration: No Feels Worthless: No Grandiose Ideas: No Belief In Special Powers: No New/Increased Substance Abuse: No Compulsions: No  Neurologic: Headache: No Seizure: No Paresthesias: No  Past Medical Family, Social History: Currently has bronchitis. Family history of anxiety and depression with biological father. Patient is a and works at .  Outpatient Encounter Prescriptions as of 07/31/2013  Medication Sig  . acetaminophen (TYLENOL) 500 MG tablet Take 500 mg by mouth daily as needed for pain.  amoxicillin-clavulanate (AUGMENTIN) 875-125 MG per tablet Take 1 tablet by mouth 2 (two) times daily.  [employer]consulting)  buPROPion (WELLBUTRIN XL) 150 MG 24 hr tablet Take 1 tablet (150 mg total) by mouth daily.  [employer] HYDROcodone-homatropine (HYDROMET) 5-1.5 MG/5ML syrup Take 5 mLs by mouth every 4 (four) hours as needed for cough.  . hydroxychloroquine (PLAQUENIL) 200 MG tablet Take 200 mg by mouth daily.  . norethindrone (CAMILA) 0.35 MG tablet Take 1 tablet by mouth daily.  . predniSONE (DELTASONE) 5 MG tablet Take 5 mg by mouth daily with breakfast.  . Prenatal Vit-Fe Fumarate-FA (PRENATAL MULTIVITAMIN) TABS Take 1 tablet by mouth at bedtime.   . sertraline (ZOLOFT) 100 MG tablet Take 1 tablet (100 mg total) by mouth daily.    Past Psychiatric History/Hospitalization(s): Anxiety: Yes Bipolar Disorder: No Depression: No Mania: No Psychosis: No Schizophrenia: No Personality Disorder: No Hospitalization for psychiatric illness: No History of Electroconvulsive Shock Therapy: No Prior Suicide Attempts: No  Physical Exam: Constitutional:  BP 89/57  Pulse 85  Ht 5' (1.524 m)  Wt 140 lb 9.6 oz (63.776 kg)  BMI 27.46 kg/m2  General Appearance: alert, oriented, no acute distress  Musculoskeletal: Strength & Muscle Tone: within normal limits Gait & Station: normal Patient leans: N/A  Psychiatric: Speech (describe rate, volume, coherence, spontaneity, and abnormalities if any): normal rate  Thought Process (describe rate, content, abstract reasoning, and computation): normal  Associations: Coherent  Thoughts: normal  Mental Status: Orientation: oriented to person, place, time/date and situation Mood & Affect: normal affect Attention Span & Concentration: fair  Medical Decision Making (Choose Three): Established Problem, Stable/Improving (1), Review of Psycho-Social Stressors (1) and Review of Medication Regimen & Side Effects (2)  Assessment: Axis I: GAD, Depression  Axis II:  deferred  Axis III: Bronchitis  Axis IV: anxiety, work stress  Axis V: GAF of 75   Plan: Anxiety: Continue  Zoloft at 100 mg daily. Continue Wellbutrin at 300mg  daily. Discussed exercise and meditation techniques to help with stress at work. More than half of the session was spent in counseling.  , MD 07/31/2013

## 2013-08-07 ENCOUNTER — Ambulatory Visit (INDEPENDENT_AMBULATORY_CARE_PROVIDER_SITE_OTHER): Payer: PRIVATE HEALTH INSURANCE | Admitting: Marriage and Family Therapist

## 2013-08-07 DIAGNOSIS — F411 Generalized anxiety disorder: Secondary | ICD-10-CM

## 2013-08-07 DIAGNOSIS — F330 Major depressive disorder, recurrent, mild: Secondary | ICD-10-CM

## 2013-08-07 NOTE — Progress Notes (Signed)
   THERAPIST PROGRESS NOTE  Session Time:  10:00 - 11:00 a.m.m  Participation Level: Active  Behavioral Response: CasualAlertAnxious (mild)  Type of Therapy: Individual Therapy  Treatment Goals addressed: Coping  Interventions: Play Therapy and Supportive  Summary: [name] is a 28 y.o. female who presents with depression and anxiety. She was referred by Jorje Guild, PA.  Patient reports she is feeling "a lot better than last session."  She reports getting back on her Wellbutrin and now has no depression.  She reports she is having some anxiety over her relationship with her husband in that they are still not where she wants to the relationship to be since the multiple changes in their life (new baby, husband going to school, patient working two jobs).  She reports now that they have paid off her husband's car they are going to have someone to come in regularly and clean their house to free up time for them.  She did reports her husband is looking for work in the hospital setting but has been unable to find a job.  She also talked about their upcoming cruise in the middle of May as a family for the first time.  Patient also states her physician is leaving and she will be seeing Dr. Michae Kava.  Talked about this writer going on vacation and what to do in case of an emergency.  Suicidal/Homicidal: N/A  Therapist Response:  Assessed patient's depression especially after last session patient having fairly significant depression.  Patient was animated, smiling and stated she has no depression at this time.  Discussed how patient and husband are moving forward with their plans and how they are "seeing the light at th end of the tunnel."  Talked about the vacation helping the relationship, but the need for regular interaction between the couple was more important.  Based on patient's vacation and improvement in mood, she will come in in one month.  Plan: Return again in 1 months.  Diagnosis: Axis  I: Major depressive d/o, moderate; GAD    Axis II: Deferred    Latrece Nitta, LMFT, CTS 08/07/2013

## 2013-09-11 ENCOUNTER — Ambulatory Visit (INDEPENDENT_AMBULATORY_CARE_PROVIDER_SITE_OTHER): Payer: PRIVATE HEALTH INSURANCE | Admitting: Marriage and Family Therapist

## 2013-09-11 DIAGNOSIS — F411 Generalized anxiety disorder: Secondary | ICD-10-CM

## 2013-09-11 DIAGNOSIS — F988 Other specified behavioral and emotional disorders with onset usually occurring in childhood and adolescence: Secondary | ICD-10-CM

## 2013-09-11 DIAGNOSIS — F330 Major depressive disorder, recurrent, mild: Secondary | ICD-10-CM

## 2013-09-11 NOTE — Progress Notes (Signed)
   THERAPIST PROGRESS NOTE  Session Time:  10:00 - 11:00 a.m.  Participation Level: Active  Behavioral Response: CasualAlertNo symptoms noted  Type of Therapy: Individual Therapy  Treatment Goals addressed: Coping  Interventions: Strength-based and Supportive  Summary: [name] is a 28 y.o. female Caucasian who presents with depression, anxiety, and ADHD.  She was referred by Jorje Guild, PA.  Patient reports she is doing well.  She talked about going away on a cruise with her husband and child, her husband getting a new job enabling her to go PRN at her job, her son turning one.  She did say her father had a medical scare and was hospitalized but that he is doing okay, but that it was "very upsetting to her."  She reports things are not where they need to be with her relationship with husband, but that they were able to have a positive conversation about it and she believes they are moving in a positive direction.  Patient also states she "believes taking Zoloft has been a miracle drug for her."    Suicidal/Homicidal: NA  Therapist Response:  Assessed for symptoms but patient reported no symptoms of depression or anxiety at this time.  Basically was a maintenance session.  Discussed patient seeing new psychiatrist in two months.  Also told patient this Clinical research associate would be retiring.  Processed with patient and talked about referrals.  Patient will come in once more before terminating.  Plan: Return again in 3 weeks.  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

## 2013-09-12 ENCOUNTER — Telehealth: Payer: Self-pay | Admitting: Family Medicine

## 2013-09-12 NOTE — Telephone Encounter (Signed)
Yes, okay to schedule.

## 2013-09-12 NOTE — Telephone Encounter (Signed)
Pt is needing an appt for tomorrow, pt has coughing/runny nose, congestion, sore throat for a month now. Ok to use sda for tomorrow?

## 2013-09-13 ENCOUNTER — Ambulatory Visit (INDEPENDENT_AMBULATORY_CARE_PROVIDER_SITE_OTHER)
Admission: RE | Admit: 2013-09-13 | Discharge: 2013-09-13 | Disposition: A | Discharge status: Home / Self Care | Payer: PRIVATE HEALTH INSURANCE | Source: Ambulatory Visit | Attending: Family Medicine | Admitting: Family Medicine

## 2013-09-13 ENCOUNTER — Encounter: Payer: Self-pay | Admitting: Family Medicine

## 2013-09-13 ENCOUNTER — Ambulatory Visit (INDEPENDENT_AMBULATORY_CARE_PROVIDER_SITE_OTHER): Payer: PRIVATE HEALTH INSURANCE | Admitting: Family Medicine

## 2013-09-13 VITALS — BP 109/71 | HR 78 | Temp 98.8°F | Wt 144.0 lb

## 2013-09-13 DIAGNOSIS — M069 Rheumatoid arthritis, unspecified: Secondary | ICD-10-CM

## 2013-09-13 DIAGNOSIS — R05 Cough: Secondary | ICD-10-CM

## 2013-09-13 DIAGNOSIS — R053 Chronic cough: Secondary | ICD-10-CM

## 2013-09-13 DIAGNOSIS — R059 Cough, unspecified: Secondary | ICD-10-CM

## 2013-09-13 DIAGNOSIS — T148XXA Other injury of unspecified body region, initial encounter: Secondary | ICD-10-CM

## 2013-09-13 HISTORY — DX: Rheumatoid arthritis, unspecified: M06.9

## 2013-09-13 LAB — CBC WITH DIFFERENTIAL/PLATELET
BASOS PCT: 0.2 % (ref 0.0–3.0)
Basophils Absolute: 0 10*3/uL (ref 0.0–0.1)
EOS PCT: 0.9 % (ref 0.0–5.0)
Eosinophils Absolute: 0.1 10*3/uL (ref 0.0–0.7)
HEMATOCRIT: 41.5 % (ref 36.0–46.0)
Hemoglobin: 13.9 g/dL (ref 12.0–15.0)
LYMPHS ABS: 1.7 10*3/uL (ref 0.7–4.0)
Lymphocytes Relative: 22.9 % (ref 12.0–46.0)
MCHC: 33.4 g/dL (ref 30.0–36.0)
MCV: 94.1 fl (ref 78.0–100.0)
MONO ABS: 0.5 10*3/uL (ref 0.1–1.0)
Monocytes Relative: 6.5 % (ref 3.0–12.0)
NEUTROS ABS: 5.1 10*3/uL (ref 1.4–7.7)
Neutrophils Relative %: 69.5 % (ref 43.0–77.0)
Platelets: 282 10*3/uL (ref 150.0–400.0)
RBC: 4.41 Mil/uL (ref 3.87–5.11)
RDW: 12.8 % (ref 11.5–15.5)
WBC: 7.4 10*3/uL (ref 4.0–10.5)

## 2013-09-13 LAB — BASIC METABOLIC PANEL
BUN: 18 mg/dL (ref 6–23)
CALCIUM: 9.5 mg/dL (ref 8.4–10.5)
CHLORIDE: 103 meq/L (ref 96–112)
CO2: 28 mEq/L (ref 19–32)
CREATININE: 0.8 mg/dL (ref 0.4–1.2)
GFR: 96.73 mL/min (ref >60.00)
Glucose, Bld: 86 mg/dL (ref 70–99)
Potassium: 3.8 mEq/L (ref 3.5–5.1)
SODIUM: 138 meq/L (ref 135–145)

## 2013-09-13 LAB — HEPATIC FUNCTION PANEL
ALK PHOS: 65 U/L (ref 39–117)
ALT: 15 U/L (ref 0–35)
AST: 18 U/L (ref 0–37)
Albumin: 4.2 g/dL (ref 3.5–5.2)
BILIRUBIN DIRECT: 0 mg/dL (ref 0.0–0.3)
BILIRUBIN TOTAL: 0.9 mg/dL (ref 0.2–1.2)
Total Protein: 7.1 g/dL (ref 6.0–8.3)

## 2013-09-13 LAB — APTT: aPTT: 28.1 s (ref 21.7–28.8)

## 2013-09-13 LAB — TSH: TSH: 0.78 u[IU]/mL (ref 0.35–4.50)

## 2013-09-13 LAB — PROTIME-INR
INR: 1 ratio (ref 0.8–1.0)
Prothrombin Time: 10.8 s (ref 9.6–13.1)

## 2013-09-13 NOTE — Progress Notes (Signed)
   Subjective:    Patient ID: [name], female    DOB: December 05, 1985, 28 y.o.   MRN: [id number]  HPI Here for a number of issues. First she has been coughing for the past 5 months. This is a dry tickling cough in the back of her throat. It is not productive, no chest pain and no SOB. She has mild heartburn at times. She also mentions frequent abdominal cramps and diarrhea about 20-30 minutes after she eats a meal. This started several months ago. She has mild nausea but no vomiting. No fever or weight loss. She has rheumatoid arthritis, and her joints have been swelling and hurting more and more lately. Finally she has had easy bruising all over the body with no known trauma for about 3 months. She had been taking Plaquenil for her arthritis, but this was stopped about 4 months ago.    Review of Systems  Constitutional: Negative.   HENT: Positive for postnasal drip, rhinorrhea and sinus pressure. Negative for congestion and ear pain.   Eyes: Negative.   Respiratory: Positive for cough. Negative for choking, chest tightness and wheezing.   Cardiovascular: Negative.   Gastrointestinal: Positive for nausea and diarrhea. Negative for vomiting, abdominal pain, constipation, blood in stool, abdominal distention and rectal pain.       Objective:   Physical Exam  Constitutional: She appears well-developed and well-nourished.  HENT:  Right Ear: External ear normal.  Left Ear: External ear normal.  Nose: Nose normal.  Mouth/Throat: Oropharynx is clear and moist.  Eyes: Conjunctivae are normal.  Neck: Neck supple. No thyromegaly present.  Cardiovascular: Normal rate, regular rhythm, normal heart sounds and intact distal pulses.   Pulmonary/Chest: Effort normal and breath sounds normal. No respiratory distress. She has no wheezes. She has no rales.  Abdominal: Soft. Bowel sounds are normal. She exhibits no distension and no mass. There is no tenderness. There is no rebound and no guarding.    Lymphadenopathy:    She has no cervical adenopathy.  Skin:  3 scattered small ecchymoses on the legs and trunk           Assessment & Plan:  It is hard to say if all these symptoms are related or not. She seems to have some allergies so I suggested she try an antihistamine like Claritin every day. Her cough may be due to GERD so she will try Prilosec OTC daily. Get a CXR today. Get labs to check for sources of bruising.

## 2013-09-13 NOTE — Telephone Encounter (Signed)
appt scheduled for pt.  

## 2013-09-13 NOTE — Progress Notes (Signed)
Pre visit review using our clinic review tool, if applicable. No additional management support is needed unless otherwise documented below in the visit note. 

## 2013-09-26 ENCOUNTER — Ambulatory Visit (INDEPENDENT_AMBULATORY_CARE_PROVIDER_SITE_OTHER): Payer: 59 | Admitting: Marriage and Family Therapist

## 2013-09-26 DIAGNOSIS — F330 Major depressive disorder, recurrent, mild: Secondary | ICD-10-CM

## 2013-09-26 DIAGNOSIS — F411 Generalized anxiety disorder: Secondary | ICD-10-CM

## 2013-09-26 NOTE — Progress Notes (Signed)
   THERAPIST PROGRESS NOTE  Session Time:  3:00 - 4:00 p.m.  Participation Level: Active  Behavioral Response: CasualAlert/No symptoms noted  Type of Therapy: Individual Therapy  Treatment Goals addressed: Coping  Interventions: Strength-based and Supportive/Termination  Summary: [name] is a 28 y.o. female Caucasian who presents with depression and anxiety.  She was referred by Jorje Guild, PA.  Patient reports she is doing "very well."  She stated, "now I surprise myself with how I make decisions not because I'm anxious.  I'm a different person."  Patient reports her marriage has improved because she and her husband moved their son to his bedroom.  She reports now they have time at night to talk to each other.    Suicidal/Homicidal: NA  Therapist Response:  Took this session to review patient's treatment plan.  Patient has successfully completed treatment.  Did discuss this Clinical research associate retiring and gave patient recommendations for referrals in case she needed them.  Recommended seven referrals, but specifically recommended [facility] and [facility].    Plan: Return again in 0 weeks.  Diagnosis: Axis I: Major depressive d/o, mild; GAD    Axis II: Deferred    Belva Koziel, LMFT, CTS 09/26/2013

## 2013-10-03 ENCOUNTER — Encounter: Payer: Self-pay | Admitting: Gastroenterology

## 2013-10-04 ENCOUNTER — Encounter: Payer: Self-pay | Admitting: Family Medicine

## 2013-10-04 ENCOUNTER — Ambulatory Visit (INDEPENDENT_AMBULATORY_CARE_PROVIDER_SITE_OTHER): Payer: PRIVATE HEALTH INSURANCE | Admitting: Family Medicine

## 2013-10-04 VITALS — BP 102/70 | HR 71 | Temp 98.6°F | Ht 60.0 in | Wt 146.0 lb

## 2013-10-04 DIAGNOSIS — R195 Other fecal abnormalities: Secondary | ICD-10-CM

## 2013-10-04 DIAGNOSIS — R059 Cough, unspecified: Secondary | ICD-10-CM

## 2013-10-04 DIAGNOSIS — R05 Cough: Secondary | ICD-10-CM

## 2013-10-04 MED ORDER — DIPHENOXYLATE-ATROPINE 2.5-0.025 MG PO TABS: 1.0000 | ORAL_TABLET | Freq: Four times a day (QID) | ORAL | Status: DC | PRN

## 2013-10-04 NOTE — Progress Notes (Signed)
Pre visit review using our clinic review tool, if applicable. No additional management support is needed unless otherwise documented below in the visit note. 

## 2013-10-04 NOTE — Progress Notes (Signed)
   Subjective:    Patient ID: [name], female    DOB: 04-09-1986, 28 y.o.   MRN: [id number]  HPI Here for 2 things. First she still has a constant dry cough which has persisted for over a month. We felt it may be related to GERD but it has not responded to taking Omeprazole. Also for the past several months she has had frequent mild lower abdominal cramps with loose stools. She may have nausea at times but has not vomited. No fever. No urinary sx. Also for the past week she has passed more mucus and flatus than usual.    Review of Systems  Constitutional: Negative.   HENT: Negative.   Eyes: Negative.   Respiratory: Positive for cough. Negative for chest tightness, shortness of breath and wheezing.   Cardiovascular: Negative.   Gastrointestinal: Positive for nausea, abdominal pain and diarrhea. Negative for vomiting, constipation, blood in stool, abdominal distention, anal bleeding and rectal pain.       Objective:   Physical Exam  Constitutional: She appears well-developed and well-nourished.  HENT:  Right Ear: External ear normal.  Left Ear: External ear normal.  Nose: Nose normal.  Mouth/Throat: Oropharynx is clear and moist.  Eyes: Conjunctivae are normal.  Pulmonary/Chest: Effort normal and breath sounds normal.  Abdominal: Soft. She exhibits no distension and no mass. There is no rebound and no guarding.  Mild periumbilical tenderness   Lymphadenopathy:    She has no cervical adenopathy.          Assessment & Plan:  For the loose stools, we will try a probiotic like Align daily along with Lomotil prn. She has already made an appt to see Dr. Christella Hartigan on 12-04-13. For the cough we will refer her to Allergy to evaluate.

## 2013-10-19 ENCOUNTER — Emergency Department
Admission: EM | Admit: 2013-10-19 | Discharge: 2013-10-19 | Disposition: A | Discharge status: Home / Self Care | Payer: PRIVATE HEALTH INSURANCE | Source: Home / Self Care | Attending: Family Medicine | Admitting: Family Medicine

## 2013-10-19 ENCOUNTER — Encounter: Payer: Self-pay | Admitting: Emergency Medicine

## 2013-10-19 DIAGNOSIS — J069 Acute upper respiratory infection, unspecified: Secondary | ICD-10-CM

## 2013-10-19 MED ORDER — AMOXICILLIN-POT CLAVULANATE 875-125 MG PO TABS: 1.0000 | ORAL_TABLET | Freq: Two times a day (BID) | ORAL | Status: DC

## 2013-10-19 MED ORDER — AZITHROMYCIN 250 MG PO TABS: ORAL_TABLET | ORAL | Status: DC

## 2013-10-19 NOTE — ED Notes (Addendum)
Sx worsening over 3 weeks with productive cough, (thick yellow mucus) facial discomfort in periorbital area.  Ears are now hurting. Having headaches and sore throat

## 2013-10-19 NOTE — ED Provider Notes (Signed)
CSN: [id number]     Arrival date & time 10/19/13  0945 History   First MD Initiated Contact with Patient 10/19/13 [phone]     Chief Complaint  Patient presents with  . URI    HPI  URI Symptoms Onset: 3 weeks  Description: rhinorrhea, nasal congestion, cough   Modifying factors:  none  Symptoms Nasal discharge: yes Fever: no Sore throat: no Cough: mild Wheezing: no Ear pain: no GI symptoms: no Sick contacts: no  Red Flags  Stiff neck: no Dyspnea: no Rash: no Swallowing difficulty: no  Sinusitis Risk Factors Headache/face pain: mild Double sickening: no tooth pain: no  Allergy Risk Factors Sneezing: no Itchy scratchy throat: no Seasonal symptoms: no  Flu Risk Factors Headache: no muscle aches: no severe fatigue: no   Past Medical History  Diagnosis Date  . Depression     sees Jorje Guild PA at [facility]  . GERD (gastroesophageal reflux disease)   . HPV (human papilloma virus) infection   . Anxiety   . Abnormal Pap smear of cervix   . Gynecological examination     sees Dr. Henderson Cloud  . Tracheomalacia, congenital   . Brachydactyly of fingers   . Rheumatoid arthritis(714.0)     sees Dr. Azzie Roup    Past Surgical History  Procedure Laterality Date  . Hand surgery  1996    to elongate the right 3rd metacarpal   . Lumbar fusion  2008    per Dr. Gerlene Fee on L4-5 for spondylolisthesis   . Back surgery  2008   Family History  Problem Relation Age of Onset  . Arthritis Mother   . Depression Mother   . Hyperlipidemia Father   . Heart disease Father   . Hypertension Father   . Arthritis Father   . Arthritis Brother   . Psoriasis Brother   . Cleft lip Brother     without palate  . Uterine cancer Maternal Aunt     x 2  . Seizures Maternal Aunt   . Mental retardation Maternal Aunt     mild  . Colon cancer Neg Hx   . Seizures Maternal Aunt   . Seizures Cousin    History  Substance Use Topics  . Smoking status: Former Smoker -- 0.50  packs/day    Types: Cigarettes    Quit date: 08/11/2007  . Smokeless tobacco: Never Used  . Alcohol Use: Yes     Comment: occ   OB History   Grav Para Term Preterm Abortions TAB SAB Ect Mult Living   1 1 0 1 0 0 0 0 0 1      Review of Systems  All other systems reviewed and are negative.   Allergies  Nutmeg oil (myristica oil)  Home Medications   Prior to Admission medications   Medication Sig Start Date End Date Taking? Authorizing Provider  acetaminophen (TYLENOL) 500 MG tablet Take 500 mg by mouth daily as needed for pain.    Historical Provider, MD  azithromycin (ZITHROMAX) 250 MG tablet Take 2 tabs PO x 1 dose, then 1 tab PO QD x 4 days 10/19/13   10/21/13, MD  buPROPion (WELLBUTRIN XL) 300 MG 24 hr tablet Take 1 tablet (300 mg total) by mouth daily. 07/31/13   Himabindu Ravi, MD  diphenoxylate-atropine (LOMOTIL) 2.5-0.025 MG per tablet Take 1 tablet by mouth 4 (four) times daily as needed for diarrhea or loose stools. 10/04/13   12/04/13, MD  fexofenadine (ALLEGRA) 180 MG tablet  Take 180 mg by mouth daily.    Historical Provider, MD  HYDROcodone-homatropine (HYDROMET) 5-1.5 MG/5ML syrup Take 5 mLs by mouth every 4 (four) hours as needed for cough. 05/27/13   Nelwyn Salisbury, MD  norethindrone (CAMILA) 0.35 MG tablet Take 1 tablet by mouth daily.    Historical Provider, MD  omeprazole (PRILOSEC OTC) 20 MG tablet Take 20 mg by mouth daily.    Historical Provider, MD  predniSONE (DELTASONE) 5 MG tablet Take 5 mg by mouth daily with breakfast.    Historical Provider, MD  Prenatal Vit-Fe Fumarate-FA (PRENATAL MULTIVITAMIN) TABS Take 1 tablet by mouth at bedtime.     Historical Provider, MD  sertraline (ZOLOFT) 100 MG tablet Take 1 tablet (100 mg total) by mouth daily. 07/31/13   Himabindu Ravi, MD   There were no vitals taken for this visit. Physical Exam  Constitutional: She appears well-developed and well-nourished.  HENT:  Head: Normocephalic and atraumatic.  Right Ear:  External ear normal.  Left Ear: External ear normal.  +nasal erythema, rhinorrhea bilaterally, + post oropharyngeal erythema    Eyes: Conjunctivae are normal. Pupils are equal, round, and reactive to light.  Neck: Normal range of motion. Neck supple.  Cardiovascular: Normal rate and regular rhythm.   Pulmonary/Chest: Effort normal and breath sounds normal.  Abdominal: Soft.  Musculoskeletal: Normal range of motion.  Neurological: She is alert.  Skin: Skin is warm.    ED Course  Procedures (including critical care time) Labs Review Labs Reviewed - No data to display  Imaging Review No results found.   MDM   1. URI (upper respiratory infection)    Will place on augmentin Discussed supportive care and infectious/ENT/resp red flags Follow up as needed.    The patient and/or caregiver has been counseled thoroughly with regard to treatment plan and/or medications prescribed including dosage, schedule, interactions, rationale for use, and possible side effects and they verbalize understanding. Diagnoses and expected course of recovery discussed and will return if not improved as expected or if the condition worsens. Patient and/or caregiver verbalized understanding.         Doree Albee, MD 10/19/13 [phone]

## 2013-10-21 ENCOUNTER — Telehealth: Payer: Self-pay

## 2013-10-21 NOTE — ED Notes (Signed)
I called and spoke with patient and she is doing better. I advised to call back if anything changes or if she has questions or concerns.  

## 2013-10-31 ENCOUNTER — Encounter (HOSPITAL_COMMUNITY): Payer: Self-pay | Admitting: Psychiatry

## 2013-10-31 ENCOUNTER — Ambulatory Visit (INDEPENDENT_AMBULATORY_CARE_PROVIDER_SITE_OTHER): Payer: No Typology Code available for payment source | Admitting: Psychiatry

## 2013-10-31 VITALS — BP 112/66 | HR 83 | Ht 61.0 in | Wt 148.8 lb

## 2013-10-31 DIAGNOSIS — F411 Generalized anxiety disorder: Secondary | ICD-10-CM

## 2013-10-31 DIAGNOSIS — F331 Major depressive disorder, recurrent, moderate: Secondary | ICD-10-CM

## 2013-10-31 MED ORDER — SERTRALINE HCL 100 MG PO TABS: 150.0000 mg | ORAL_TABLET | Freq: Every day | ORAL | Status: DC

## 2013-10-31 MED ORDER — BUPROPION HCL ER (XL) 300 MG PO TB24: 300.0000 mg | ORAL_TABLET | Freq: Every day | ORAL | Status: DC

## 2013-10-31 NOTE — Progress Notes (Signed)
[facility] [id number] Progress Note  [name] [id number] 28 y.o.  10/31/2013 10:14 AM  Chief Complaint: depression  History of Present Illness: Overall she is doing ok. Husband is her support system and he is busy with school and work. Pt is caring for her 34 mo old but he recently started daycare. Her father was sick a few weeks ago.  Last few weeks depression is getting a little worse. She is spending a lot of time in bed. Reports low motivation, fatigue, increased amount of time sleeping and anhedonia. Pt is sleeping about 10 hrs. Appetite is decreased but she is working on weight loss. Concentration is ok. Reports this is a common pattern when she is stressed. Symptoms will last for one month or until she is distracted by something else. Wellbutrin helps control her depression.   Anxiety remains uncontrolled. Pt feels anxiety on daily basis for 1-2 hrs thru out the day. She experiences racing thoughts, palpitations and sweaty hands. Feels like a piece of hair or bugs are crawling on her randomly when really anxious. In September pt started pulling her eyelashes out and it is decreased with Zoloft. She is engaging in the behavior every other day now.  Zoloft has decreased anxiety a lot.   States Wellbutrin and Zoloft have helped a lot. She is taking them as prescribed. She is not sure if the Zoloft is causing decreased libido.  Suicidal Ideation: No Plan Formed: No Patient has means to carry out plan: No  Homicidal Ideation: No Plan Formed: No Patient has means to carry out plan: No  Review of Systems: Psychiatric: Agitation: No Hallucination: No Depressed Mood: Yes Insomnia: No Hypersomnia: Yes Altered Concentration: No Feels Worthless: No Grandiose Ideas: No Belief In Special Powers: No New/Increased Substance Abuse: No Compulsions: No  Neurologic: Headache: Yes Seizure: No Paresthesias: No  Past Medical Family, Social History: Pt is married and has one  child. She works as a Engineer, civil (consulting) at [employer]. Pt drinks wine 3x/week and is a former smoker. She denies drug use. Pt reports family hx of depression and anxiety in her father. Also reports family of depression in her cousin who attempted suicide.   Outpatient Encounter Prescriptions as of 10/31/2013  Medication Sig  . acetaminophen (TYLENOL) 500 MG tablet Take 500 mg by mouth daily as needed for pain.  Marland Kitchen amoxicillin-clavulanate (AUGMENTIN) 875-125 MG per tablet Take 1 tablet by mouth 2 (two) times daily.  Marland Kitchen buPROPion (WELLBUTRIN XL) 300 MG 24 hr tablet Take 300 mg by mouth daily.  . norethindrone (CAMILA) 0.35 MG tablet Take 1 tablet by mouth daily.  Marland Kitchen omeprazole (PRILOSEC OTC) 20 MG tablet Take 20 mg by mouth daily.  . predniSONE (DELTASONE) 5 MG tablet Take 5 mg by mouth daily with breakfast.  . Prenatal Vit-Fe Fumarate-FA (PRENATAL MULTIVITAMIN) TABS Take 1 tablet by mouth at bedtime.   . sertraline (ZOLOFT) 100 MG tablet Take 1 tablet (100 mg total) by mouth daily.  Marland Kitchen azithromycin (ZITHROMAX) 250 MG tablet Take 2 tabs PO x 1 dose, then 1 tab PO QD x 4 days  . diphenoxylate-atropine (LOMOTIL) 2.5-0.025 MG per tablet Take 1 tablet by mouth 4 (four) times daily as needed for diarrhea or loose stools.  . fexofenadine (ALLEGRA) 180 MG tablet Take 180 mg by mouth daily.  Marland Kitchen HYDROcodone-homatropine (HYDROMET) 5-1.5 MG/5ML syrup Take 5 mLs by mouth every 4 (four) hours as needed for cough.    Past Psychiatric History/Hospitalization(s): Anxiety: Yes Bipolar Disorder: No Depression:  Yes Mania: No Psychosis: No Schizophrenia: No Personality Disorder: No Hospitalization for psychiatric illness: No History of Electroconvulsive Shock Therapy: No Prior Suicide Attempts: No  Physical Exam: Constitutional:  BP 112/66  Pulse 83  Ht 5\' 1"  (1.549 m)  Wt 148 lb 12.8 oz (67.495 kg)  BMI 28.13 kg/m2  General Appearance: alert, oriented, no acute distress and well nourished  Musculoskeletal: Strength  & Muscle Tone: within normal limits Gait & Station: normal Patient leans: N/A  Mental Status Examination/Evaluation:  Objective: Appearance: fairly groomed, appears to be stated age  Attitude: Calm and cooperative  Eye Contact: Fair   Speech and Language: Clear and Coherent, spontaneous, normal rate  Volume: Normal   Mood: euthymic  Affect: Full Range   Thought Process: Coherent   Attention/Concentration: WNL  Orientation: Full (Time, Place, and Person)   Thought Content: WDL  Suicidal Thoughts: No  Homicidal Thoughts: No   Judgement: Fair   General fund of knowledge: average  Insight: Present   Psychomotor Activity: Normal   Akathisia: No   Handed: Right   AIMS (if indicated): n/a   Medical Decision Making (Choose Three): Review of Psycho-Social Stressors (1), Review or order clinical lab tests (1), Established Problem, Worsening (2), Review of Medication Regimen & Side Effects (2) and Review of New Medication or Change in Dosage (2)   Assessment: Axis I: GAD, Depression  Axis II: deferred  Axis III: Bronchitis  Axis IV: anxiety, work stress  Axis V: GAF of 75    Plan: Continue Zoloft and increase to 150 mg daily for anxiety and depression Continue Wellbutrin XL 300mg  daily for depression -Risks and benefits, side effects and alternatives discussed with patient, pt was given an opportunity to ask questions about medication, illness, and treatment. All current psychiatric medications have been reviewed and discussed with the patient and adjusted as clinically appropriate. The patient has been provided an accurate and updated list of the medications being now prescribed. -pt is aware that Wellbutrin and Zoloft are category C medications and that risk of birth defects can not be ruled out. Pt is thinking about getting pregnant in the next several months. We discussed the risk of teratogeny and this provider's recommendation that medication be discontinued due to this risk. Pt  verbalized understanding about the risk and opted to continue treatment with both medications.   Reviewed labs 09/13/2013- WNL  Refer to therapist for depression and anxiety  Therapy: brief supportive therapy provided. Discussed psychosocial stressors in detail. Encouraged to continue exercise.  Pt denies SI and is at an acute low risk for suicide.Patient told to call clinic if any problems occur. Patient advised to go to ER if they should develop SI/HI, side effects, or if symptoms worsen. Has crisis numbers to call if needed. Pt verbalized understanding.  F/up in 2 months or sooner if needed   , MD 10/31/2013

## 2013-11-26 ENCOUNTER — Encounter (HOSPITAL_COMMUNITY): Payer: Self-pay | Admitting: Professional Counselor

## 2013-11-26 ENCOUNTER — Ambulatory Visit (INDEPENDENT_AMBULATORY_CARE_PROVIDER_SITE_OTHER): Payer: 59 | Admitting: Professional Counselor

## 2013-11-26 DIAGNOSIS — F331 Major depressive disorder, recurrent, moderate: Secondary | ICD-10-CM
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
## 2013-12-02 ENCOUNTER — Ambulatory Visit (INDEPENDENT_AMBULATORY_CARE_PROVIDER_SITE_OTHER): Payer: 59 | Admitting: Family Medicine

## 2013-12-02 ENCOUNTER — Encounter: Payer: Self-pay | Admitting: Family Medicine

## 2013-12-02 VITALS — BP 111/78 | HR 76 | Temp 99.2°F | Ht 60.0 in | Wt 148.0 lb

## 2013-12-02 DIAGNOSIS — J019 Acute sinusitis, unspecified: Secondary | ICD-10-CM

## 2013-12-02 MED ORDER — LEVOFLOXACIN 500 MG PO TABS: 500.0000 mg | ORAL_TABLET | Freq: Every day | ORAL | Status: DC

## 2013-12-02 NOTE — Progress Notes (Signed)
Pre visit review using our clinic review tool, if applicable. No additional management support is needed unless otherwise documented below in the visit note. 

## 2013-12-02 NOTE — Progress Notes (Signed)
   Subjective:    Patient ID: [name], female    DOB: [birth year], 28 y.o.   MRN: [id number]  HPI Here for 3 weeks of sinus pressure, PND, and coughing up green sputum. No fever. She saw Urgent care on 10-19-13 and was given Augmentin, but this did not help.    Review of Systems  Constitutional: Negative.   HENT: Positive for congestion, nosebleeds and sinus pressure.   Eyes: Negative.   Respiratory: Positive for cough.        Objective:   Physical Exam  Constitutional: She appears well-developed and well-nourished.  HENT:  Right Ear: External ear normal.  Left Ear: External ear normal.  Nose: Nose normal.  Mouth/Throat: Oropharynx is clear and moist.  Eyes: Conjunctivae are normal.  Pulmonary/Chest: Effort normal and breath sounds normal.  Lymphadenopathy:    She has no cervical adenopathy.          Assessment & Plan:  Try Levaquin.

## 2013-12-04 ENCOUNTER — Ambulatory Visit (INDEPENDENT_AMBULATORY_CARE_PROVIDER_SITE_OTHER): Payer: 59 | Admitting: Gastroenterology

## 2013-12-04 ENCOUNTER — Other Ambulatory Visit (INDEPENDENT_AMBULATORY_CARE_PROVIDER_SITE_OTHER): Payer: 59

## 2013-12-04 ENCOUNTER — Encounter: Payer: Self-pay | Admitting: Gastroenterology

## 2013-12-04 VITALS — BP 90/60 | HR 76 | Ht 60.5 in | Wt 147.1 lb

## 2013-12-04 DIAGNOSIS — R198 Other specified symptoms and signs involving the digestive system and abdomen: Secondary | ICD-10-CM

## 2013-12-04 DIAGNOSIS — R194 Change in bowel habit: Secondary | ICD-10-CM

## 2013-12-04 LAB — COMPREHENSIVE METABOLIC PANEL
ALT: 16 U/L (ref 0–35)
AST: 20 U/L (ref 0–37)
Albumin: 4.1 g/dL (ref 3.5–5.2)
Alkaline Phosphatase: 66 U/L (ref 39–117)
BUN: 15 mg/dL (ref 6–23)
CALCIUM: 9.2 mg/dL (ref 8.4–10.5)
CHLORIDE: 106 meq/L (ref 96–112)
CO2: 27 mEq/L (ref 19–32)
CREATININE: 0.6 mg/dL (ref 0.4–1.2)
GFR: 119.91 mL/min (ref >60.00)
Glucose, Bld: 91 mg/dL (ref 70–99)
Potassium: 4.3 mEq/L (ref 3.5–5.1)
Sodium: 138 mEq/L (ref 135–145)
Total Bilirubin: 0.6 mg/dL (ref 0.2–1.2)
Total Protein: 7 g/dL (ref 6.0–8.3)

## 2013-12-04 LAB — CBC WITH DIFFERENTIAL/PLATELET
BASOS PCT: 3.1 % — AB (ref 0.0–3.0)
Basophils Absolute: 0.2 10*3/uL — ABNORMAL HIGH (ref 0.0–0.1)
EOS ABS: 0.1 10*3/uL (ref 0.0–0.7)
Eosinophils Relative: 1.9 % (ref 0.0–5.0)
HEMATOCRIT: 38.8 % (ref 36.0–46.0)
HEMOGLOBIN: 13.3 g/dL (ref 12.0–15.0)
LYMPHS PCT: 31.6 % (ref 12.0–46.0)
Lymphs Abs: 2.1 10*3/uL (ref 0.7–4.0)
MCHC: 34.4 g/dL (ref 30.0–36.0)
MCV: 91.6 fl (ref 78.0–100.0)
Monocytes Absolute: 0.7 10*3/uL (ref 0.1–1.0)
Monocytes Relative: 11 % (ref 3.0–12.0)
NEUTROS ABS: 3.4 10*3/uL (ref 1.4–7.7)
Neutrophils Relative %: 52.4 % (ref 43.0–77.0)
Platelets: 233 10*3/uL (ref 150.0–400.0)
RBC: 4.23 Mil/uL (ref 3.87–5.11)
RDW: 12.2 % (ref 11.5–15.5)
WBC: 6.5 10*3/uL (ref 4.0–10.5)

## 2013-12-04 LAB — SEDIMENTATION RATE: Sed Rate: 10 mm/hr (ref 0–22)

## 2013-12-04 NOTE — Progress Notes (Signed)
HPI: This is a  very pleasant 28 year old woman whom I am meeting for the first time today.  Pregnant; second child is 15 months  Has seronegative RA, last year started on plaquenil; around that time lower abd pain, cramping.  Came off the med several months ago and GI symptoms continued.    Cramping, diarrhea, nausea; started in September.  Around time of starting plaquenil.    1-2 weeks of mucous with every stool.  During that time small amount of blood, red.   A lot of mucoussy stools, has had minor rectal bleeding.  Lomotil and probiotics, have helped.  No FH of colon cancer, polyps, colitis  Pregnant ([redacted] weeks along) now.  Takes lomotil periodically, align daily.  In past 2-3 weeks, no issues.  Good spell. Then cramping will develop, sweaty, dizzy, urgency; diarrhea. Will last for a few days.  Lomotil seems to help usually.  Immodium works too well.  July, 2012 upper endoscopy by Dr. Sheryn Bison. Pathology report shows normal small bowel, normal esophageal biopsies. She did not have eosinophilic esophagitis or pathologic findings of celiac sprue.  Review of systems: Pertinent positive and negative review of systems were noted in the above HPI section. Complete review of systems was performed and was otherwise normal.    Past Medical History  Diagnosis Date  . Depression     sees Jorje Guild PA at [facility]  . GERD (gastroesophageal reflux disease)   . HPV (human papilloma virus) infection   . Anxiety   . Abnormal Pap smear of cervix   . Gynecological examination     sees Dr. Henderson Cloud  . Tracheomalacia, congenital   . Brachydactyly of fingers   . Rheumatoid arthritis(714.0)     sees Dr. Azzie Roup     Past Surgical History  Procedure Laterality Date  . Hand surgery  1996    to elongate the right 3rd metacarpal   . Lumbar fusion  2008    per Dr. Gerlene Fee on L4-5 for spondylolisthesis     Current Outpatient Prescriptions  Medication Sig Dispense  Refill  . acetaminophen (TYLENOL) 500 MG tablet Take 500 mg by mouth daily as needed for pain.      Marland Kitchen buPROPion (WELLBUTRIN XL) 300 MG 24 hr tablet Take 1 tablet (300 mg total) by mouth daily.  30 tablet  1  . omeprazole (PRILOSEC OTC) 20 MG tablet Take 20 mg by mouth daily.      . Prenatal Vit-Fe Fumarate-FA (PRENATAL MULTIVITAMIN) TABS Take 1 tablet by mouth at bedtime.       . sertraline (ZOLOFT) 100 MG tablet Take 1.5 tablets (150 mg total) by mouth daily.  45 tablet  1   No current facility-administered medications for this visit.    Allergies as of 12/04/2013 - Review Complete 12/04/2013  Allergen Reaction Noted  . Nutmeg oil (myristica oil) Hives 08/31/2012    Family History  Problem Relation Age of Onset  . Arthritis Mother   . Hyperlipidemia Father   . Heart disease Father   . Hypertension Father   . Arthritis Father   . Depression Father   . Anxiety disorder Father   . Arthritis Brother   . Psoriasis Brother   . Cleft lip Brother     without palate  . Uterine cancer Maternal Aunt     x 2  . Seizures Maternal Aunt   . Mental retardation Maternal Aunt     mild  . Colon cancer Neg Hx   .  Seizures Cousin   . Depression Cousin     attempted suicide    History   Social History  . Marital Status: Married    Spouse Name: N/A    Number of Children: 1  . Years of Education: N/A   Occupational History  . nurse [employer]   Social History Main Topics  . Smoking status: Former Smoker -- 0.50 packs/day    Types: Cigarettes    Quit date: 08/11/2007  . Smokeless tobacco: Never Used  . Alcohol Use: 1.8 oz/week    3 Glasses of wine per week     Comment: 1 glass of wine 3x/week  . Drug Use: No  . Sexual Activity: Yes    Birth Control/ Protection: None     Comment: had intercourse yesterday   Other Topics Concern  . Not on file   Social History Narrative  . No narrative on file       Physical Exam: Ht 5' 0.5" (1.537 m)  Wt 147 lb 2 oz (66.735 kg)   BMI 28.25 kg/m2  LMP 11/03/2013  Breastfeeding? Yes Constitutional: generally well-appearing Psychiatric: alert and oriented x3 Eyes: extraocular movements intact Mouth: oral pharynx moist, no lesions Neck: supple no lymphadenopathy Cardiovascular: heart regular rate and rhythm Lungs: clear to auscultation bilaterally Abdomen: soft, nontender, nondistended, no obvious ascites, no peritoneal signs, normal bowel sounds Extremities: no lower extremity edema bilaterally Skin: no lesions on visible extremities    Assessment and plan: 28 y.o. female with  IBS-like symptoms, currently pregnant  Her IBS-like symptoms are well-controlled on periodic Lomotil. Since she is pregnant I prefer not to do sedation, endoscopic procedure, [place] capsule in needed. She understands and agrees. For now she'll get a basic set of labs including CBC, complete metabolic profile, sedimentation rate, stool testing for pathogen panel and Clostridium difficile. If these oral negative then we will simply have her continue her periodic Lomotil and she'll return to see me after she delivers. She knows she gets really get in touch sooner if needed.

## 2013-12-04 NOTE — Patient Instructions (Signed)
You will have labs checked today in the basement lab.  Please head down after you check out with the front desk  (cbc, cmet, esr, stool for pathogen panel, c. Diff by pcr). If all of the above are normal, just continue periodic lomotil and return after pregnancy; please call sooner if needed.

## 2013-12-09 ENCOUNTER — Other Ambulatory Visit: Payer: 59

## 2013-12-09 DIAGNOSIS — R194 Change in bowel habit: Secondary | ICD-10-CM

## 2013-12-10 LAB — GASTROINTESTINAL PATHOGEN PANEL PCR
C. difficile Tox A/B, PCR: NEGATIVE
CAMPYLOBACTER, PCR: NEGATIVE
CRYPTOSPORIDIUM, PCR: NEGATIVE
E COLI (ETEC) LT/ST, PCR: NEGATIVE
E coli (STEC) stx1/stx2, PCR: NEGATIVE
E coli 0157, PCR: NEGATIVE
Giardia lamblia, PCR: NEGATIVE
NOROVIRUS, PCR: NEGATIVE
ROTAVIRUS, PCR: NEGATIVE
Salmonella, PCR: NEGATIVE
Shigella, PCR: NEGATIVE

## 2013-12-10 LAB — CLOSTRIDIUM DIFFICILE BY PCR: CDIFFPCR: NOT DETECTED

## 2013-12-17 ENCOUNTER — Ambulatory Visit (HOSPITAL_COMMUNITY): Payer: Self-pay | Admitting: Professional Counselor

## 2014-01-02 ENCOUNTER — Ambulatory Visit (HOSPITAL_COMMUNITY): Payer: Self-pay | Admitting: Psychiatry

## 2014-01-06 LAB — OB RESULTS CONSOLE GC/CHLAMYDIA
Chlamydia: NEGATIVE
GC PROBE AMP, GENITAL: NEGATIVE

## 2014-01-06 LAB — OB RESULTS CONSOLE RPR: RPR: NONREACTIVE

## 2014-01-06 LAB — OB RESULTS CONSOLE HIV ANTIBODY (ROUTINE TESTING): HIV: NONREACTIVE

## 2014-01-07 ENCOUNTER — Encounter (HOSPITAL_COMMUNITY): Payer: Self-pay | Admitting: Psychiatry

## 2014-01-07 ENCOUNTER — Ambulatory Visit (INDEPENDENT_AMBULATORY_CARE_PROVIDER_SITE_OTHER): Payer: 59 | Admitting: Psychiatry

## 2014-01-07 ENCOUNTER — Ambulatory Visit (HOSPITAL_COMMUNITY): Payer: Self-pay | Admitting: Professional Counselor

## 2014-01-07 VITALS — BP 105/68 | HR 77 | Ht 61.0 in | Wt 150.4 lb

## 2014-01-07 DIAGNOSIS — F3289 Other specified depressive episodes: Secondary | ICD-10-CM

## 2014-01-07 DIAGNOSIS — F331 Major depressive disorder, recurrent, moderate: Secondary | ICD-10-CM

## 2014-01-07 DIAGNOSIS — F411 Generalized anxiety disorder: Secondary | ICD-10-CM

## 2014-01-07 DIAGNOSIS — F329 Major depressive disorder, single episode, unspecified: Secondary | ICD-10-CM

## 2014-01-07 MED ORDER — BUPROPION HCL ER (XL) 300 MG PO TB24: 300.0000 mg | ORAL_TABLET | Freq: Every day | ORAL | Status: DC

## 2014-01-07 MED ORDER — SERTRALINE HCL 100 MG PO TABS: 150.0000 mg | ORAL_TABLET | Freq: Every day | ORAL | Status: DC

## 2014-01-07 NOTE — Progress Notes (Signed)
Patient ID: [name], female   DOB: May 24, 1985, 28 y.o.   MRN: [id number]  [facility] [address] Progress Note  [name] [id number] 28 y.o.  01/07/2014 10:48 AM  Chief Complaint: "I'm ok"  History of Present Illness: Pt is pregnant and due in April 2016. She has continued to take Zoloft and Wellbutrin and is aware that both meds are Category C. Pt is following up with Ob/Gyn who are aware that she is taking both meds and have not expressed any concerns. Pt wants to continue taking both.   Husband is back in school so pt feels alone. Husband is easily stressed and is distant from her. Things are ok with her 16 mo. Her father has recovered. Work is "ok".   Depression is about the same. States she is more emotional and often feels alone. Denies isolation and anhedonia. Sleep is fair. Energy is low due to pregnancy. She makes an effort to go the gym. Appetite is low due to nausea and she is having a lot of food aversions. Concentration is fair. Usually husband is her support and he is busy so depression continues.   Anxiety is manageable. Pt continues to pull her eyelashes a few days a week. It makes her more anxious. No change with increase in Zoloft dose.   States Wellbutrin and Zoloft have helped a lot. She is taking them as prescribed. She does think the Zoloft is causing decreased libido. Pt doesn't want doses of meds changed today and doesn't want to try anything new.   Suicidal Ideation: No Plan Formed: No Patient has means to carry out plan: No  Homicidal Ideation: No Plan Formed: No Patient has means to carry out plan: No  Review of Systems: Psychiatric: Agitation: Yes Hallucination: No Depressed Mood: Yes Insomnia: No Hypersomnia: Yes Altered Concentration: No Feels Worthless: No Grandiose Ideas: No Belief In Special Powers: No New/Increased Substance Abuse: No Compulsions: No  Neurologic: Headache: No Seizure: No Paresthesias: No  Past  Medical Family, Social History: Pt is married and has one child. She works as a Engineer, civil (consulting) at [employer]. Pt drinks wine 3x/week and is a former smoker. She denies drug use. Pt reports family hx of depression and anxiety in her father. Also reports family of depression in her cousin who attempted suicide.   Past Medical History  Diagnosis Date  . Depression     sees Christine Guild PA at [facility]  . GERD (gastroesophageal reflux disease)   . HPV (human papilloma virus) infection   . Anxiety   . Abnormal Pap smear of cervix   . Gynecological examination     sees Dr. Henderson [name]  . Tracheomalacia, congenital   . Brachydactyly of fingers   . Rheumatoid arthritis(714.0)     sees Dr. Azzie [name]   . Pregnancy     due August 10, 2014    Outpatient Encounter Prescriptions as of 01/07/2014  Medication Sig  . acetaminophen (TYLENOL) 500 MG tablet Take 500 mg by mouth daily as needed for pain.  Marland Kitchen buPROPion (WELLBUTRIN XL) 300 MG 24 hr tablet Take 1 tablet (300 mg total) by mouth daily.  Marland Kitchen omeprazole (PRILOSEC OTC) 20 MG tablet Take 20 mg by mouth daily.  . Prenatal Vit-Fe Fumarate-FA (PRENATAL MULTIVITAMIN) TABS Take 1 tablet by mouth at bedtime.   . sertraline (ZOLOFT) 100 MG tablet Take 1.5 tablets (150 mg total) by mouth daily.    Past Psychiatric History/Hospitalization(s): Anxiety: Yes Bipolar Disorder: No Depression: Yes Mania:  No Psychosis: No Schizophrenia: No Personality Disorder: No Hospitalization for psychiatric illness: No History of Electroconvulsive Shock Therapy: No Prior Suicide Attempts: No  Physical Exam: Constitutional:  BP 105/68  Pulse 77  Ht 5\' 1"  (1.549 m)  Wt 150 lb 6.4 oz (68.221 kg)  BMI 28.43 kg/m2  LMP 11/03/2013  General Appearance: alert, oriented, no acute distress and well nourished  Musculoskeletal: Strength & Muscle Tone: within normal limits Gait & Station: normal Patient leans: N/A  Mental Status Examination/Evaluation: Objective:  Attitude: Calm and cooperative  Appearance: Casual, appears to be stated age  Eye Contact::  Good  Speech:  Clear and Coherent and Normal Rate  Volume:  Normal  Mood:  depressed  Affect:  Full Range  Thought Process:  Intact, Linear and Logical  Orientation:  Full (Time, Place, and Person)  Thought Content:  Negative  Suicidal Thoughts:  No  Homicidal Thoughts:  No  Judgement:  Fair  Insight:  Fair  Concentration: good  Memory: Immediate-fair Recent-fair Remote-fair  Recall: fair  Language: fair  Gait and Station: normal  [phone] of Knowledge: average  Psychomotor Activity:  Normal  Akathisia:  No  Handed:  Right  AIMS (if indicated): n/a        Medical Decision Making (Choose Three): Review of Psycho-Social Stressors (1), Established Problem, Worsening (2) and Review of Medication Regimen & Side Effects (2)   Assessment: Axis I: GAD, Depression  Axis II: deferred  Axis III: Bronchitis  Axis IV: anxiety, work stress  Axis V: GAF of 75    Plan: Continue Zoloft 150 mg daily for anxiety and depression Continue Wellbutrin XL 300mg  daily for depression -Risks and benefits, side effects and alternatives discussed with patient, pt was given an opportunity to ask questions about medication, illness, and treatment. All current psychiatric medications have been reviewed and discussed with the patient and adjusted as clinically appropriate. The patient has been provided an accurate and updated list of the medications being now prescribed. -pt is aware that Wellbutrin and Zoloft are category C medications and that risk of birth defects can not be ruled out. Pt is pregnant and has continued to take meds and is under teh care of an Ob/gyn. We discussed the risk of teratogeny. Pt verbalized understanding about the risk and opted to continue treatment with both medications.   No new labs today  Refer to therapist for depression and anxiety. Pt will be calling for appt soon.    Therapy: brief supportive therapy provided. Discussed psychosocial stressors in detail. Encouraged to continue exercise.  Pt denies SI and is at an acute low risk for suicide.Patient told to call clinic if any problems occur. Patient advised to go to ER if they should develop SI/HI, side effects, or if symptoms worsen. Has crisis numbers to call if needed. Pt verbalized understanding.  F/up in 2 months or sooner if needed   [employer], MD 01/07/2014

## 2014-02-24 ENCOUNTER — Encounter (HOSPITAL_COMMUNITY): Payer: Self-pay | Admitting: Psychiatry

## 2014-03-11 ENCOUNTER — Encounter (HOSPITAL_COMMUNITY): Payer: Self-pay | Admitting: Psychiatry

## 2014-03-11 ENCOUNTER — Ambulatory Visit (INDEPENDENT_AMBULATORY_CARE_PROVIDER_SITE_OTHER): Payer: 59 | Admitting: Psychiatry

## 2014-03-11 VITALS — BP 119/66 | HR 77 | Ht 60.0 in | Wt 154.6 lb

## 2014-03-11 DIAGNOSIS — F331 Major depressive disorder, recurrent, moderate: Secondary | ICD-10-CM

## 2014-03-11 DIAGNOSIS — F411 Generalized anxiety disorder: Secondary | ICD-10-CM

## 2014-03-11 HISTORY — DX: Major depressive disorder, recurrent, moderate: F33.1

## 2014-03-11 HISTORY — DX: Generalized anxiety disorder: F41.1

## 2014-03-11 MED ORDER — BUPROPION HCL ER (XL) 300 MG PO TB24: 300.0000 mg | ORAL_TABLET | Freq: Every day | ORAL | Status: DC

## 2014-03-11 MED ORDER — SERTRALINE HCL 100 MG PO TABS: 150.0000 mg | ORAL_TABLET | Freq: Every day | ORAL | Status: DC

## 2014-03-11 NOTE — Progress Notes (Signed)
[facility] [address] Progress Note  [name] [id number] 28 y.o.  03/11/2014 10:55 AM  Chief Complaint: "I'm better than last time"  History of Present Illness: Pt is pregnant and due in April 2016. Currently in her 2nd trimester and feeling better. She has continued to take Zoloft and Wellbutrin and is aware that both meds are Category C. Pt is following up with Ob/Gyn who are aware that she is taking both meds and have not expressed any concerns. Pt wants to continue taking both.   Depression is better. She feels more support from her husband. Emotions are under better control. She is no longer throwing up and that has helped. Energy remains low. Denies isolation, worthlessness, crying spells and anhedonia. Sleeping well and getting about 6-7 hrs. Appetite is good. Concentration is normal.   Anxiety is manageable. Pt continues to pull her eyelashes and has decreased to one day a week. Thinks it is due to feeling better.    States Wellbutrin and Zoloft have helped a lot. She is taking them as prescribed. She does think the Zoloft is causing decreased libido. Pt doesn't want doses of meds changed today and doesn't want to try anything new.   Suicidal Ideation: No Plan Formed: No Patient has means to carry out plan: No  Homicidal Ideation: No Plan Formed: No Patient has means to carry out plan: No  Review of Systems: Psychiatric: Agitation: No Hallucination: No Depressed Mood: Yes Insomnia: No Hypersomnia: No Altered Concentration: No Feels Worthless: No Grandiose Ideas: No Belief In Special Powers: No New/Increased Substance Abuse: No Compulsions: No  Neurologic: Headache: No Seizure: No Paresthesias: No   Review of Systems  Constitutional: Negative for fever and chills.  HENT: Positive for congestion. Negative for nosebleeds and sore throat.   Eyes: Negative for blurred vision, double vision and redness.  Respiratory: Negative for cough, sputum  production and wheezing.   Cardiovascular: Negative for chest pain and leg swelling.  Gastrointestinal: Positive for heartburn. Negative for nausea, vomiting, abdominal pain, diarrhea and constipation.  Musculoskeletal: Negative for back pain, joint pain and neck pain.  Skin: Negative for itching and rash.  Neurological: Negative for dizziness, tingling, seizures, weakness and headaches.  Psychiatric/Behavioral: Positive for depression. Negative for suicidal ideas, hallucinations and substance abuse. The patient is not nervous/anxious and does not have insomnia.     Past Medical Family, Social History: Pt is married and has one child. She works as a Engineer, civil (consulting) at [employer]. Pt drinks wine 3x/week and is a former smoker. She denies drug use. Pt reports family hx of depression and anxiety in her father. Also reports family of depression in her cousin who attempted suicide.   Past Medical History  Diagnosis Date  . Depression     sees Jorje Guild PA at [facility]  . GERD (gastroesophageal reflux disease)   . HPV (human papilloma virus) infection   . Anxiety   . Abnormal Pap smear of cervix   . Gynecological examination     sees Dr. Henderson Cloud  . Tracheomalacia, congenital   . Brachydactyly of fingers   . Rheumatoid arthritis(714.0)     sees Dr. Azzie Roup   . Pregnancy     due August 10, 2014    Outpatient Encounter Prescriptions as of 03/11/2014  Medication Sig  . acetaminophen (TYLENOL) 500 MG tablet Take 500 mg by mouth daily as needed for pain.  Marland Kitchen buPROPion (WELLBUTRIN XL) 300 MG 24 hr tablet Take 1 tablet (300 mg total)  by mouth daily.  Marland Kitchen omeprazole (PRILOSEC OTC) 20 MG tablet Take 20 mg by mouth daily.  . Prenatal Vit-Fe Fumarate-FA (PRENATAL MULTIVITAMIN) TABS Take 1 tablet by mouth at bedtime.   Marland Kitchen PROGESTERONE IM Inject into the muscle.  . sertraline (ZOLOFT) 100 MG tablet Take 1.5 tablets (150 mg total) by mouth daily.    Past Psychiatric  History/Hospitalization(s): Anxiety: Yes Bipolar Disorder: No Depression: Yes Mania: No Psychosis: No Schizophrenia: No Personality Disorder: No Hospitalization for psychiatric illness: No History of Electroconvulsive Shock Therapy: No Prior Suicide Attempts: No  Physical Exam: Constitutional:  BP 119/66 mmHg  Pulse 77  Ht 5' (1.524 m)  Wt 154 lb 9.6 oz (70.126 kg)  BMI 30.19 kg/m2  LMP 11/03/2013  General Appearance: alert, oriented, no acute distress and well nourished  Musculoskeletal: Strength & Muscle Tone: within normal limits Gait & Station: normal Patient leans: N/A  Mental Status Examination/Evaluation: Objective: Attitude: Calm and cooperative  Appearance: Casual, appears to be stated age  Eye Contact::  Good  Speech:  Clear and Coherent and Normal Rate  Volume:  Normal  Mood:  depressed  Affect:  Full Range brighter than at previous appts  Thought Process:  Intact, Linear and Logical  Orientation:  Full (Time, Place, and Person)  Thought Content:  Negative  Suicidal Thoughts:  No  Homicidal Thoughts:  No  Judgement:  Fair  Insight:  Fair  Concentration: good  Memory: Immediate-fair Recent-fair Remote-fair  Recall: fair  Language: fair  Gait and Station: normal  [employer] of Knowledge: average  Psychomotor Activity:  Normal  Akathisia:  No  Handed:  Right  AIMS (if indicated): n/a        Medical Decision Making (Choose Three): Established Problem, Stable/Improving (1), Review of Psycho-Social Stressors (1), Review of Last Therapy Session (1) and Review of Medication Regimen & Side Effects (2)   Assessment: Axis I: GAD, MDD- recurrent, moderate Axis II: deferred  Axis III:  Past Medical History  Diagnosis Date  . Depression     sees Jorje Guild PA at [facility]  . GERD (gastroesophageal reflux disease)   . HPV (human papilloma virus) infection   . Anxiety   . Abnormal Pap smear of cervix   . Gynecological examination      sees Dr. Henderson Cloud  . Tracheomalacia, congenital   . Brachydactyly of fingers   . Rheumatoid arthritis(714.0)     sees Dr. Azzie Roup   . Pregnancy     due August 10, 2014    Axis IV: anxiety, work stress  Axis V: GAF of 75    Plan: Continue Zoloft 150 mg daily for anxiety and depression Continue Wellbutrin XL 300mg  daily for depression -Risks and benefits, side effects and alternatives discussed with patient, pt was given an opportunity to ask questions about medication, illness, and treatment. All current psychiatric medications have been reviewed and discussed with the patient and adjusted as clinically appropriate. The patient has been provided an accurate and updated list of the medications being now prescribed. -pt is aware that Wellbutrin and Zoloft are category C medications and that risk of birth defects can not be ruled out. Pt is pregnant and has continued to take meds and is under the care of an Ob/gyn. We discussed the risk of teratogeny. Pt verbalized understanding about the risk and opted to continue treatment with both medications.   No new labs today  Refered to therapist for depression and anxiety. Pt will be calling for appt soon.She  has not yet set up an appts   Therapy: brief supportive therapy provided. Discussed psychosocial stressors in detail. Encouraged to continue exercise.  Pt denies SI and is at an acute low risk for suicide.Patient told to call clinic if any problems occur. Patient advised to go to ER if they should develop SI/HI, side effects, or if symptoms worsen. Has crisis numbers to call if needed. Pt verbalized understanding.  F/up in 3 months or sooner if needed   Oletta Darter, MD 03/11/2014

## 2014-04-25 NOTE — L&D Delivery Note (Signed)
Operative Delivery Note At 6:12 PM a viable female was delivered via .  Presentation: vertex; Position: Right,, Occiput,, Anterior; Station: +3.  Verbal consent: obtained from patient.  Risks and benefits discussed in detail.  Risks include, but are not limited to the risks of anesthesia, bleeding, infection, damage to maternal tissues, fetal cephalhematoma.  There is also the risk of inability to effect vaginal delivery of the head, or shoulder dystocia that cannot be resolved by established maneuvers, leading to the need for emergency cesarean section.  APGAR: pending weight pending  Placenta status: mnanual intact with 3 vessel cord, .   Cord:  with the following complications:none .  Cord pH: not obtained  Anesthesia: Epidural  Instruments: mushroom - 1 pull no popoff Episiotomy:  none Lacerations:  first Suture Repair: 3.0 chromic Est. Blood Loss (mL):  300  Mom to postpartum.  Baby to NICU.  Maks Cavallero L 07/05/2014, 6:23 PM

## 2014-06-02 ENCOUNTER — Encounter (HOSPITAL_COMMUNITY): Payer: Self-pay | Admitting: *Deleted

## 2014-06-02 ENCOUNTER — Observation Stay (HOSPITAL_COMMUNITY)
Admission: AD | Admit: 2014-06-02 | Discharge: 2014-06-03 | Disposition: A | Discharge status: Home Health | Payer: 59 | Source: Ambulatory Visit | Attending: Obstetrics and Gynecology | Admitting: Obstetrics and Gynecology

## 2014-06-02 DIAGNOSIS — E860 Dehydration: Secondary | ICD-10-CM | POA: Diagnosis not present

## 2014-06-02 DIAGNOSIS — Z79899 Other long term (current) drug therapy: Secondary | ICD-10-CM | POA: Diagnosis not present

## 2014-06-02 DIAGNOSIS — Z885 Allergy status to narcotic agent status: Secondary | ICD-10-CM | POA: Insufficient documentation

## 2014-06-02 DIAGNOSIS — K529 Noninfective gastroenteritis and colitis, unspecified: Secondary | ICD-10-CM

## 2014-06-02 DIAGNOSIS — E876 Hypokalemia: Secondary | ICD-10-CM | POA: Insufficient documentation

## 2014-06-02 DIAGNOSIS — M549 Dorsalgia, unspecified: Secondary | ICD-10-CM | POA: Diagnosis not present

## 2014-06-02 DIAGNOSIS — Z91018 Allergy to other foods: Secondary | ICD-10-CM | POA: Insufficient documentation

## 2014-06-02 DIAGNOSIS — O26893 Other specified pregnancy related conditions, third trimester: Secondary | ICD-10-CM | POA: Diagnosis not present

## 2014-06-02 DIAGNOSIS — R102 Pelvic and perineal pain: Secondary | ICD-10-CM | POA: Diagnosis present

## 2014-06-02 DIAGNOSIS — K219 Gastro-esophageal reflux disease without esophagitis: Secondary | ICD-10-CM | POA: Diagnosis not present

## 2014-06-02 DIAGNOSIS — F329 Major depressive disorder, single episode, unspecified: Secondary | ICD-10-CM | POA: Diagnosis not present

## 2014-06-02 DIAGNOSIS — O26899 Other specified pregnancy related conditions, unspecified trimester: Secondary | ICD-10-CM

## 2014-06-02 DIAGNOSIS — Z789 Other specified health status: Secondary | ICD-10-CM | POA: Diagnosis present

## 2014-06-02 DIAGNOSIS — Z3A35 35 weeks gestation of pregnancy: Secondary | ICD-10-CM | POA: Insufficient documentation

## 2014-06-02 DIAGNOSIS — M069 Rheumatoid arthritis, unspecified: Secondary | ICD-10-CM | POA: Diagnosis not present

## 2014-06-02 DIAGNOSIS — Z87891 Personal history of nicotine dependence: Secondary | ICD-10-CM | POA: Diagnosis not present

## 2014-06-02 DIAGNOSIS — F419 Anxiety disorder, unspecified: Secondary | ICD-10-CM | POA: Diagnosis not present

## 2014-06-02 HISTORY — DX: Noninfective gastroenteritis and colitis, unspecified: K52.9

## 2014-06-02 LAB — COMPREHENSIVE METABOLIC PANEL
ALK PHOS: 83 U/L (ref 39–117)
ALT: 13 U/L (ref 0–35)
ANION GAP: 5 (ref 5–15)
AST: 22 U/L (ref 0–37)
Albumin: 2.6 g/dL — ABNORMAL LOW (ref 3.5–5.2)
BILIRUBIN TOTAL: 0.2 mg/dL — AB (ref 0.3–1.2)
BUN: 9 mg/dL (ref 6–23)
CALCIUM: 7.3 mg/dL — AB (ref 8.4–10.5)
CO2: 24 mmol/L (ref 19–32)
CREATININE: 0.42 mg/dL — AB (ref 0.50–1.10)
Chloride: 106 mmol/L (ref 96–112)
GFR calc non Af Amer: >90 mL/min (ref >90)
GLUCOSE: 103 mg/dL — AB (ref 70–99)
Potassium: 3 mmol/L — ABNORMAL LOW (ref 3.5–5.1)
Sodium: 135 mmol/L (ref 135–145)
Total Protein: 5.3 g/dL — ABNORMAL LOW (ref 6.0–8.3)

## 2014-06-02 LAB — URINALYSIS, ROUTINE W REFLEX MICROSCOPIC
Bilirubin Urine: NEGATIVE
Glucose, UA: NEGATIVE mg/dL
HGB URINE DIPSTICK: NEGATIVE
Ketones, ur: 15 mg/dL — AB
Nitrite: NEGATIVE
PH: 6.5 (ref 5.0–8.0)
PROTEIN: NEGATIVE mg/dL
Specific Gravity, Urine: 1.01 (ref 1.005–1.030)
Urobilinogen, UA: 0.2 mg/dL (ref 0.0–1.0)

## 2014-06-02 LAB — FETAL FIBRONECTIN: Fetal Fibronectin: NEGATIVE

## 2014-06-02 LAB — URINE MICROSCOPIC-ADD ON

## 2014-06-02 LAB — CBC
HEMATOCRIT: 31.6 % — AB (ref 36.0–46.0)
Hemoglobin: 10.9 g/dL — ABNORMAL LOW (ref 12.0–15.0)
MCH: 31.4 pg (ref 26.0–34.0)
MCHC: 34.5 g/dL (ref 30.0–36.0)
MCV: 91.1 fL (ref 78.0–100.0)
PLATELETS: 122 10*3/uL — AB (ref 150–400)
RBC: 3.47 MIL/uL — ABNORMAL LOW (ref 3.87–5.11)
RDW: 13.3 % (ref 11.5–15.5)
WBC: 10.1 10*3/uL (ref 4.0–10.5)

## 2014-06-02 MED ORDER — ONDANSETRON HCL 4 MG/2ML IJ SOLN
4.0000 mg | Freq: Once | INTRAMUSCULAR | Status: AC
  Administered 2014-06-02: 4 mg via INTRAVENOUS
  Filled 2014-06-02: qty 2

## 2014-06-02 MED ORDER — LACTATED RINGERS IV BOLUS (SEPSIS)
1000.0000 mL | Freq: Once | INTRAVENOUS | Status: AC
  Administered 2014-06-02: 1000 mL via INTRAVENOUS

## 2014-06-02 MED ORDER — KCL-LACTATED RINGERS-D5W 20 MEQ/L IV SOLN: INTRAVENOUS | Status: DC

## 2014-06-02 MED ORDER — ZOLPIDEM TARTRATE 5 MG PO TABS
5.0000 mg | ORAL_TABLET | Freq: Every evening | ORAL | Status: DC | PRN
  Administered 2014-06-02: 5 mg via ORAL
  Filled 2014-06-02: qty 1

## 2014-06-02 MED ORDER — OXYCODONE-ACETAMINOPHEN 5-325 MG PO TABS
1.0000 | ORAL_TABLET | ORAL | Status: DC | PRN
  Administered 2014-06-02 – 2014-06-03 (×2): 1 via ORAL
  Filled 2014-06-02 (×2): qty 1

## 2014-06-02 MED ORDER — DOCUSATE SODIUM 100 MG PO CAPS: 100.0000 mg | ORAL_CAPSULE | Freq: Every day | ORAL | Status: DC

## 2014-06-02 MED ORDER — POTASSIUM CHLORIDE 2 MEQ/ML IV SOLN
INTRAVENOUS | Status: DC
  Administered 2014-06-02 – 2014-06-03 (×2): via INTRAVENOUS
  Filled 2014-06-02 (×3): qty 1000

## 2014-06-02 MED ORDER — FLUCONAZOLE 150 MG PO TABS
150.0000 mg | ORAL_TABLET | Freq: Once | ORAL | Status: AC
  Administered 2014-06-02: 150 mg via ORAL
  Filled 2014-06-02: qty 1

## 2014-06-02 MED ORDER — CALCIUM CARBONATE ANTACID 500 MG PO CHEW: 2.0000 | CHEWABLE_TABLET | ORAL | Status: DC | PRN

## 2014-06-02 MED ORDER — HYDROMORPHONE HCL 1 MG/ML IJ SOLN
0.5000 mg | Freq: Once | INTRAMUSCULAR | Status: AC
  Administered 2014-06-02: 0.5 mg via INTRAVENOUS
  Filled 2014-06-02: qty 1

## 2014-06-02 MED ORDER — ONDANSETRON HCL 4 MG/2ML IJ SOLN: 4.0000 mg | Freq: Three times a day (TID) | INTRAMUSCULAR | Status: DC | PRN

## 2014-06-02 MED ORDER — PRENATAL MULTIVITAMIN CH: 1.0000 | ORAL_TABLET | Freq: Every day | ORAL | Status: DC

## 2014-06-02 NOTE — MAU Note (Signed)
J Raush notifying Dr. Renaldo Fiddler of pt's pain and condition.

## 2014-06-02 NOTE — MAU Provider Note (Signed)
History     CSN: [id number]  Arrival date and time: 06/02/14 1519   None     Chief Complaint  Patient presents with  . Back Pain  . pelvic pressure    HPI   Ms. [name] is a 29 y.o. female who presents with pelvic pressure, back pain, N/V and soft stools. She woke in the middle of the night with a fever of 99.2. She vomited 3 times and had more than 10 soft stools throughout the night. She started feeling dizzy and light headed last night and feels that she may be dehydrated.  She conitinues to feel sick however has not vomited since this morning.   She has a history of preterm labor/ preterm delivery '@35'  weeks with her last pregnancy. She is currently receiving 17P injections weekly.   She took phenergan at 1400 and zofran at 1030; this has helped some.   + fetal movement Denies vaginal bleeding or leaking.   OB History    Gravida Para Term Preterm AB TAB SAB Ectopic Multiple Living   '2 1 0 1 0 0 0 0 0 1 '      Past Medical History  Diagnosis Date  . Depression     sees Jimmye Norman PA at [facility]  . GERD (gastroesophageal reflux disease)   . HPV (human papilloma virus) infection   . Anxiety   . Abnormal Pap smear of cervix   . Gynecological examination     sees Dr. Gaetano Net  . Tracheomalacia, congenital   . Brachydactyly of fingers   . Rheumatoid arthritis(714.0)     sees Dr. Tobie Lords   . Pregnancy     due August 10, 2014    Past Surgical History  Procedure Laterality Date  . Hand surgery  1996    to elongate the right 3rd metacarpal   . Lumbar fusion  2008    per Dr. Hal Neer on L4-5 for spondylolisthesis     Family History  Problem Relation Age of Onset  . Arthritis Mother   . Hyperlipidemia Father   . Heart disease Father   . Hypertension Father   . Arthritis Father   . Depression Father   . Anxiety disorder Father   . Arthritis Brother   . Psoriasis Brother   . Cleft lip Brother     without palate  . Uterine cancer Maternal  Aunt     x 2  . Seizures Maternal Aunt   . Mental retardation Maternal Aunt     mild  . Colon cancer Neg Hx   . Seizures Cousin   . Depression Cousin     attempted suicide  . Suicidality Cousin     History  Substance Use Topics  . Smoking status: Former Smoker -- 0.50 packs/day    Types: Cigarettes    Quit date: 08/11/2007  . Smokeless tobacco: Never Used  . Alcohol Use: No     Comment: no longer drinking, previous was 1 glass of wine 3x/week    Allergies:  Allergies  Allergen Reactions  . Nutmeg Oil (Myristica Oil) Hives  . Vicodin [Hydrocodone-Acetaminophen] Itching    Prescriptions prior to admission  Medication Sig Dispense Refill Last Dose  . acetaminophen (TYLENOL) 500 MG tablet Take 500 mg by mouth daily as needed for pain.   Taking  . buPROPion (WELLBUTRIN XL) 300 MG 24 hr tablet Take 1 tablet (300 mg total) by mouth daily. 30 tablet 2   . omeprazole (PRILOSEC OTC) 20  MG tablet Take 20 mg by mouth daily.   Taking  . Prenatal Vit-Fe Fumarate-FA (PRENATAL MULTIVITAMIN) TABS Take 1 tablet by mouth at bedtime.    Taking  . PROGESTERONE IM Inject into the muscle.   Taking  . sertraline (ZOLOFT) 100 MG tablet Take 1.5 tablets (150 mg total) by mouth daily. 45 tablet 2    Results for orders placed or performed during the hospital encounter of 06/02/14 (from the past 48 hour(s))  Urinalysis, Routine w reflex microscopic     Status: Abnormal   Collection Time: 06/02/14  3:32 PM  Result Value Ref Range   Color, Urine YELLOW YELLOW   APPearance CLEAR CLEAR   Specific Gravity, Urine 1.010 1.005 - 1.030   pH 6.5 5.0 - 8.0   Glucose, UA NEGATIVE NEGATIVE mg/dL   Hgb urine dipstick NEGATIVE NEGATIVE   Bilirubin Urine NEGATIVE NEGATIVE   Ketones, ur 15 (A) NEGATIVE mg/dL   Protein, ur NEGATIVE NEGATIVE mg/dL   Urobilinogen, UA 0.2 0.0 - 1.0 mg/dL   Nitrite NEGATIVE NEGATIVE   Leukocytes, UA TRACE (A) NEGATIVE  Urine microscopic-add on     Status: Abnormal   Collection  Time: 06/02/14  3:32 PM  Result Value Ref Range   Squamous Epithelial / LPF MANY (A) RARE   WBC, UA 3-6 <3 WBC/hpf   RBC / HPF 0-2 <3 RBC/hpf   Bacteria, UA FEW (A) RARE   Urine-Other RARE YEAST   Fetal fibronectin     Status: None   Collection Time: 06/02/14  4:12 PM  Result Value Ref Range   Fetal Fibronectin NEGATIVE NEGATIVE  CBC     Status: Abnormal   Collection Time: 06/02/14  5:16 PM  Result Value Ref Range   WBC 10.1 4.0 - 10.5 K/uL   RBC 3.47 (L) 3.87 - 5.11 MIL/uL   Hemoglobin 10.9 (L) 12.0 - 15.0 g/dL   HCT 31.6 (L) 36.0 - 46.0 %   MCV 91.1 78.0 - 100.0 fL   MCH 31.4 26.0 - 34.0 pg   MCHC 34.5 30.0 - 36.0 g/dL   RDW 13.3 11.5 - 15.5 %   Platelets 122 (L) 150 - 400 K/uL  Comprehensive metabolic panel     Status: Abnormal   Collection Time: 06/02/14  5:16 PM  Result Value Ref Range   Sodium 135 135 - 145 mmol/L   Potassium 3.0 (L) 3.5 - 5.1 mmol/L   Chloride 106 96 - 112 mmol/L   CO2 24 19 - 32 mmol/L   Glucose, Bld 103 (H) 70 - 99 mg/dL   BUN 9 6 - 23 mg/dL   Creatinine, Ser 0.42 (L) 0.50 - 1.10 mg/dL   Calcium 7.3 (L) 8.4 - 10.5 mg/dL   Total Protein 5.3 (L) 6.0 - 8.3 g/dL   Albumin 2.6 (L) 3.5 - 5.2 g/dL   AST 22 0 - 37 U/L   ALT 13 0 - 35 U/L   Alkaline Phosphatase 83 39 - 117 U/L   Total Bilirubin 0.2 (L) 0.3 - 1.2 mg/dL   GFR calc non Af Amer >90 >90 mL/min   GFR calc Af Amer >90 >90 mL/min    Comment: (NOTE) The eGFR has been calculated using the CKD EPI equation. This calculation has not been validated in all clinical situations. eGFR's persistently <90 mL/min signify possible Chronic Kidney Disease.    Anion gap 5 5 - 15    Review of Systems  Constitutional: Negative for fever and chills.  Gastrointestinal: Positive for  nausea and abdominal pain (Pressure ). Negative for diarrhea and constipation.  Musculoskeletal: Positive for back pain (All over her lower back. ).   Physical Exam   Blood pressure 116/60, pulse 114, temperature 98.9 F  (37.2 C), temperature source Oral, resp. rate 20, height 5' (1.524 m), weight 80.74 kg (178 lb), last menstrual period 11/03/2013, currently breastfeeding.  Physical Exam  Constitutional: She is oriented to person, place, and time. She appears well-developed and well-nourished. She has a sickly appearance. She appears ill. She appears distressed.  Eyes: Pupils are equal, round, and reactive to light.  Neck: Neck supple.  Cardiovascular: Normal rate and normal heart sounds.   Respiratory: Effort normal.  GI: Soft. Normal appearance and bowel sounds are normal. There is tenderness in the left lower quadrant. There is no rigidity, no rebound, no guarding and no CVA tenderness.  Genitourinary:  Dilation: Closed Effacement (%):  (soft) Cervical Position: Posterior Exam by:: Lenna Sciara Rasch, NP Fetal fibronectin collected.   Musculoskeletal: Normal range of motion.  Neurological: She is alert and oriented to person, place, and time.  Skin: Skin is warm. She is not diaphoretic.  Psychiatric: Her behavior is normal.   Fetal Tracing: Baseline: 150 bpm  Variability: Moderate  Accelerations: 15x15 Decelerations: Quick variables  Toco: occasional contraction/UI    MAU Course  Procedures  None  MDM UA shows yeast> diflucan given PO  CBC; WBC WNL  CMET LR bolus Dilaudid 1 mg Zofran 4 mg Patient rates her pain 3/10 following dilaudid  Discussed patient with Dr. Julien Girt awaiting labs.  1915: Discussed pain status with patient, patient states that her pain has started coming back. She describes the pain as pelvic pressure.  Discussed the patient again with Dr. Julien Girt. K= 3.0, pain has returned. Will admit over night for observation and K replacement.   Assessment and Plan   A:  Gastroenteritis Hypokalemia Pelvic pain in pregnancy   P: Admit to Ante for observation IV fluids Replace K Pain management.   Darrelyn Hillock Rasch, NP 06/02/2014 7:29 PM

## 2014-06-02 NOTE — MAU Note (Signed)
Woke up during the night vomiting 3 times, had BM's but not diarrhea.  Lower back, hip, & pelvic pain since 0900.  Denies bleeding or LOF, reports occasional uc's.  No vomitting or diarrhea now.

## 2014-06-03 DIAGNOSIS — E860 Dehydration: Secondary | ICD-10-CM

## 2014-06-03 DIAGNOSIS — Z789 Other specified health status: Secondary | ICD-10-CM | POA: Diagnosis present

## 2014-06-03 HISTORY — DX: Dehydration: E86.0

## 2014-06-03 LAB — CBC
HEMATOCRIT: 28.5 % — AB (ref 36.0–46.0)
HEMOGLOBIN: 9.9 g/dL — AB (ref 12.0–15.0)
MCH: 31.8 pg (ref 26.0–34.0)
MCHC: 34.7 g/dL (ref 30.0–36.0)
MCV: 91.6 fL (ref 78.0–100.0)
Platelets: 123 10*3/uL — ABNORMAL LOW (ref 150–400)
RBC: 3.11 MIL/uL — ABNORMAL LOW (ref 3.87–5.11)
RDW: 13.4 % (ref 11.5–15.5)
WBC: 7.6 10*3/uL (ref 4.0–10.5)

## 2014-06-03 LAB — COMPREHENSIVE METABOLIC PANEL
ALT: 13 U/L (ref 0–35)
AST: 20 U/L (ref 0–37)
Albumin: 2.2 g/dL — ABNORMAL LOW (ref 3.5–5.2)
Alkaline Phosphatase: 70 U/L (ref 39–117)
Anion gap: 3 — ABNORMAL LOW (ref 5–15)
BILIRUBIN TOTAL: 0.5 mg/dL (ref 0.3–1.2)
BUN: 6 mg/dL (ref 6–23)
CO2: 23 mmol/L (ref 19–32)
CREATININE: 0.42 mg/dL — AB (ref 0.50–1.10)
Calcium: 7.4 mg/dL — ABNORMAL LOW (ref 8.4–10.5)
Chloride: 109 mmol/L (ref 96–112)
GFR calc Af Amer: >90 mL/min (ref >90)
GLUCOSE: 106 mg/dL — AB (ref 70–99)
Potassium: 3.1 mmol/L — ABNORMAL LOW (ref 3.5–5.1)
Sodium: 135 mmol/L (ref 135–145)
Total Protein: 4.7 g/dL — ABNORMAL LOW (ref 6.0–8.3)

## 2014-06-03 NOTE — Discharge Summary (Signed)
Obstetric Discharge Summary Reason for Admission: dehydration Prenatal Procedures: IV fluids Intrapartum Procedures: GBS prophylaxis and none Postpartum Procedures: none and NA Complications-Operative and Postpartum: none HEMOGLOBIN  Date Value Ref Range Status  06/03/2014 9.9* 12.0 - 15.0 g/dL Final   HCT  Date Value Ref Range Status  06/03/2014 28.5* 36.0 - 46.0 % Final    Physical Exam:  General: alert, cooperative, appears stated age and no distress Lochia: NA Uterine Fundus: Gravid nt Incision: na DVT Evaluation: No evidence of DVT seen on physical exam.  Discharge Diagnoses: Antepartum Dehydration with Gastroeneteritis  Discharge Information: Date: 06/03/2014 Activity: unrestricted Diet: routine Medications: Antiemetics Condition: stable Instructions: refer to practice specific booklet and see d/c orders Discharge to: home   Newborn Data: This patient has no babies on file. Home with still pregnant.  [name] C 06/03/2014, 10:09 AM

## 2014-06-03 NOTE — H&P (Signed)
Chief Complaint  Patient presents with  . Back Pain  . pelvic pressure    HPI   Ms. [name] is a 29 y.o. female who presents with pelvic pressure, back pain, N/V and soft stools. She woke in the middle of the night with a fever of 99.2. She vomited 3 times and had more than 10 soft stools throughout the night. She started feeling dizzy and light headed last night and feels that she may be dehydrated.  She conitinues to feel sick however has not vomited since this morning.   She has a history of preterm labor/ preterm delivery _0  weeks with her last pregnancy. She is currently receiving 17P injections weekly.   She took phenergan at 1400 and zofran at 1030; this has helped some.   + fetal movement Denies vaginal bleeding or leaking.   OB History    Gravida Para Term Preterm AB TAB SAB Ectopic Multiple Living   _1      Past Medical History  Diagnosis Date  . Depression     sees Jimmye Norman PA at [facility]  . GERD (gastroesophageal reflux disease)   . HPV (human papilloma virus) infection   . Anxiety   . Abnormal Pap smear of cervix   . Gynecological examination     sees Dr. Gaetano Net  . Tracheomalacia, congenital   . Brachydactyly of fingers   . Rheumatoid arthritis(714.0)     sees Dr. Tobie Lords   . Pregnancy     due August 10, 2014    Past Surgical History  Procedure Laterality Date  . Hand surgery  1996    to elongate the right 3rd metacarpal   . Lumbar fusion  2008    per Dr. Hal Neer on L4-5 for spondylolisthesis     Family History  Problem Relation Age of Onset  . Arthritis Mother   . Hyperlipidemia Father   . Heart disease Father   . Hypertension Father   . Arthritis Father   . Depression Father   . Anxiety disorder Father   . Arthritis Brother   . Psoriasis Brother   . Cleft  lip Brother     without palate  . Uterine cancer Maternal Aunt     x 2  . Seizures Maternal Aunt   . Mental retardation Maternal Aunt     mild  . Colon cancer Neg Hx   . Seizures Cousin   . Depression Cousin     attempted suicide  . Suicidality Cousin     History  Substance Use Topics  . Smoking status: Former Smoker -- 0.50 packs/day    Types: Cigarettes    Quit date: 08/11/2007  . Smokeless tobacco: Never Used  . Alcohol Use: No     Comment: no longer drinking, previous was 1 glass of wine 3x/week    Allergies:  Allergies  Allergen Reactions  . Nutmeg Oil (Myristica Oil) Hives  . Vicodin [Hydrocodone-Acetaminophen] Itching    Prescriptions prior to admission  Medication Sig Dispense Refill Last Dose  . acetaminophen (TYLENOL) 500 MG tablet Take 500 mg by mouth daily as needed for pain.   Taking  . buPROPion (WELLBUTRIN XL) 300 MG 24 hr tablet Take 1 tablet (300 mg total) by mouth daily. 30 tablet 2   . omeprazole (PRILOSEC OTC) 20 MG tablet Take 20 mg by mouth daily.   Taking  . Prenatal Vit-Fe Fumarate-FA (PRENATAL MULTIVITAMIN) TABS Take 1  tablet by mouth at bedtime.    Taking  . PROGESTERONE IM Inject into the muscle.   Taking  . sertraline (ZOLOFT) 100 MG tablet Take 1.5 tablets (150 mg total) by mouth daily. 45 tablet 2     Lab Results Last 48 Hours    Results for orders placed or performed during the hospital encounter of 06/02/14 (from the past 48 hour(s))  Urinalysis, Routine w reflex microscopic Status: Abnormal   Collection Time: 06/02/14 3:32 PM  Result Value Ref Range   Color, Urine YELLOW YELLOW   APPearance CLEAR CLEAR   Specific Gravity, Urine 1.010 1.005 - 1.030   pH 6.5 5.0 - 8.0   Glucose, UA NEGATIVE NEGATIVE mg/dL   Hgb urine dipstick NEGATIVE NEGATIVE   Bilirubin Urine NEGATIVE  NEGATIVE   Ketones, ur 15 (A) NEGATIVE mg/dL   Protein, ur NEGATIVE NEGATIVE mg/dL   Urobilinogen, UA 0.2 0.0 - 1.0 mg/dL   Nitrite NEGATIVE NEGATIVE   Leukocytes, UA TRACE (A) NEGATIVE  Urine microscopic-add on Status: Abnormal   Collection Time: 06/02/14 3:32 PM  Result Value Ref Range   Squamous Epithelial / LPF MANY (A) RARE   WBC, UA 3-6 <3 WBC/hpf   RBC / HPF 0-2 <3 RBC/hpf   Bacteria, UA FEW (A) RARE   Urine-Other RARE YEAST   Fetal fibronectin Status: None   Collection Time: 06/02/14 4:12 PM  Result Value Ref Range   Fetal Fibronectin NEGATIVE NEGATIVE  CBC Status: Abnormal   Collection Time: 06/02/14 5:16 PM  Result Value Ref Range   WBC 10.1 4.0 - 10.5 K/uL   RBC 3.47 (L) 3.87 - 5.11 MIL/uL   Hemoglobin 10.9 (L) 12.0 - 15.0 g/dL   HCT 31.6 (L) 36.0 - 46.0 %   MCV 91.1 78.0 - 100.0 fL   MCH 31.4 26.0 - 34.0 pg   MCHC 34.5 30.0 - 36.0 g/dL   RDW 13.3 11.5 - 15.5 %   Platelets 122 (L) 150 - 400 K/uL  Comprehensive metabolic panel Status: Abnormal   Collection Time: 06/02/14 5:16 PM  Result Value Ref Range   Sodium 135 135 - 145 mmol/L   Potassium 3.0 (L) 3.5 - 5.1 mmol/L   Chloride 106 96 - 112 mmol/L   CO2 24 19 - 32 mmol/L   Glucose, Bld 103 (H) 70 - 99 mg/dL   BUN 9 6 - 23 mg/dL   Creatinine, Ser 0.42 (L) 0.50 - 1.10 mg/dL   Calcium 7.3 (L) 8.4 - 10.5 mg/dL   Total Protein 5.3 (L) 6.0 - 8.3 g/dL   Albumin 2.6 (L) 3.5 - 5.2 g/dL   AST 22 0 - 37 U/L   ALT 13 0 - 35 U/L   Alkaline Phosphatase 83 39 - 117 U/L   Total Bilirubin 0.2 (L) 0.3 - 1.2 mg/dL   GFR calc non Af Amer >90 >90 mL/min   GFR calc Af Amer >90 >90 mL/min    Comment: (NOTE) The eGFR has been calculated using the CKD EPI equation. This calculation has not been validated in all clinical situations. eGFR's  persistently <90 mL/min signify possible Chronic Kidney Disease.    Anion gap 5 5 - 15      Review of Systems  Constitutional: Negative for fever and chills.  Gastrointestinal: Positive for nausea and abdominal pain (Pressure ). Negative for diarrhea and constipation.  Musculoskeletal: Positive for back pain (All over her lower back. ).   Physical Exam   Blood pressure 116/60, pulse 114,  temperature 98.9 F (37.2 C), temperature source Oral, resp. rate 20, height 5' (1.524 m), weight 80.74 kg (178 lb), last menstrual period 11/03/2013, currently breastfeeding.  Physical Exam  Constitutional: She is oriented to person, place, and time. She appears well-developed and well-nourished. She has a sickly appearance. She appears ill. She appears distressed.  Eyes: Pupils are equal, round, and reactive to light.  Neck: Neck supple.  Cardiovascular: Normal rate and normal heart sounds.  Respiratory: Effort normal.  GI: Soft. Normal appearance and bowel sounds are normal. There is tenderness in the left lower quadrant. There is no rigidity, no rebound, no guarding and no CVA tenderness.  Genitourinary:  Dilation: Closed Effacement (%): (soft) Cervical Position: Posterior Exam by:: Lenna Sciara Rasch, NP Fetal fibronectin collected.  Musculoskeletal: Normal range of motion.  Neurological: She is alert and oriented to person, place, and time.  Skin: Skin is warm. She is not diaphoretic.  Psychiatric: Her behavior is normal.   Fetal Tracing: Baseline: 150 bpm  Variability: Moderate  Accelerations: 15x15 Decelerations: Quick variables  Toco: occasional contraction/UI    MAU Course  Procedures  None  MDM UA shows yeast> diflucan given PO  CBC; WBC WNL  CMET LR bolus Dilaudid 1 mg Zofran 4 mg Patient rates her pain 3/10 following dilaudid  Discussed patient with Dr. Julien Girt awaiting labs.  1915: Discussed pain status with patient, patient states that her pain has  started coming back. She describes the pain as pelvic pressure.  Discussed the patient again with Dr. Julien Girt. K= 3.0, pain has returned. Will admit over night for observation and K replacement.   Assessment and Plan   A:  Gastroenteritis Hypokalemia Pelvic pain in pregnancy   P: Admit to [facility] for observation IV fluids Replace K Pain management.

## 2014-06-03 NOTE — Progress Notes (Signed)
Pt discharged home with mother.  States good understanding of instructions.

## 2014-06-12 ENCOUNTER — Encounter (HOSPITAL_COMMUNITY): Payer: Self-pay | Admitting: Psychiatry

## 2014-06-12 ENCOUNTER — Ambulatory Visit (INDEPENDENT_AMBULATORY_CARE_PROVIDER_SITE_OTHER): Payer: 59 | Admitting: Psychiatry

## 2014-06-12 VITALS — BP 123/73 | HR 96 | Ht 60.75 in | Wt 179.2 lb

## 2014-06-12 DIAGNOSIS — F411 Generalized anxiety disorder: Secondary | ICD-10-CM

## 2014-06-12 DIAGNOSIS — F331 Major depressive disorder, recurrent, moderate: Secondary | ICD-10-CM

## 2014-06-12 MED ORDER — SERTRALINE HCL 100 MG PO TABS: 150.0000 mg | ORAL_TABLET | Freq: Every day | ORAL | Status: DC

## 2014-06-12 MED ORDER — BUPROPION HCL ER (XL) 300 MG PO TB24: 300.0000 mg | ORAL_TABLET | Freq: Every day | ORAL | Status: DC

## 2014-06-12 NOTE — Progress Notes (Signed)
Patient ID: [name], female   DOB: [birth year], 29 y.o.   MRN: [id number]  [facility] [id number] Progress Note  [name] [id number] 29 y.o.  06/12/2014 10:40 AM  Chief Complaint: "I have had ups and downs"  History of Present Illness: Pt is pregnant and due in April 2016. Currently in her 3rd trimester. She has continued to take Zoloft and Wellbutrin and is aware that both meds are Category C. Pt is following up with Ob/Gyn who are aware that she is taking both meds and have not expressed any concerns. Pt wants to continue taking both. States with her last pregnancy she tried stopping meds in the 3rd trimester and didn't do well. 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   Depression is coming and going. Today it is ok. Feels it coincides with her husbands school. Husband is gone a lot for work and school. Pt feels almost like a single mom and lonely and unappreciated. They have talked about it and he is trying to be more attentive.  He has the summer off and graduates next year. Pt is looking forward it. Last time she felt down was at the of Jan she had sad mood and anhedonia and worthlessness. Symptoms resolved after talking.   Today denies depression and sad mood. Denies isolation, worthlessness, crying spells and anhedonia. Sleeping poorly and getting about 3 hrs. Appetite and energy are good. Concentration is normal.   Anxiety is manageable. Pt continues to pull her eyelashes and has decreased to one day every other week.     States Wellbutrin and Zoloft have helped a lot. She is taking them as prescribed. Libido is improved and pt denies SE.  Pt doesn't want doses of meds changed today and doesn't want to try anything new.   Suicidal Ideation: No Plan Formed: No Patient has means to carry out plan: No  Homicidal Ideation: No Plan Formed: No Patient has means to carry out plan: No  Review of Systems: Psychiatric: Agitation: No Hallucination:  No Depressed Mood: No Insomnia: Yes Hypersomnia: No Altered Concentration: No Feels Worthless: No Grandiose Ideas: No Belief In Special Powers: No New/Increased Substance Abuse: No Compulsions: No  Neurologic: Headache: No Seizure: No Paresthesias: No   Review of Systems  Constitutional: Negative for fever, chills and weight loss.  HENT: Negative for congestion, hearing loss, sore throat and tinnitus.   Eyes: Negative for blurred vision, double vision and redness.  Respiratory: Negative for cough, shortness of breath and wheezing.   Cardiovascular: Negative for chest pain, palpitations and leg swelling.  Gastrointestinal: Positive for heartburn. Negative for nausea, vomiting and abdominal pain.  Musculoskeletal: Positive for back pain. Negative for myalgias, joint pain and neck pain.  Skin: Negative for itching and rash.  Neurological: Positive for dizziness. Negative for focal weakness, seizures, loss of consciousness and weakness.  Psychiatric/Behavioral: Negative for depression, suicidal ideas, hallucinations and substance abuse. The patient has insomnia. The patient is not nervous/anxious.     Past Medical Family, Social History: Pt is married and has one child. She works as a Engineer, civil (consulting) at [employer]. Pt drinks wine 3x/week and is a former smoker. She denies drug use. Pt reports family hx of depression and anxiety in her father. Also reports family of depression in her cousin who attempted suicide.   Past Medical History  Diagnosis Date  . Depression     sees Jorje Guild PA at [facility]  . GERD (gastroesophageal reflux disease)   .  HPV (human papilloma virus) infection   . Anxiety   . Abnormal Pap smear of cervix   . Gynecological examination     sees Dr. Henderson Cloud  . Tracheomalacia, congenital   . Brachydactyly of fingers   . Rheumatoid arthritis(714.0)     sees Dr. Azzie Roup   . Pregnancy     due August 10, 2014    Outpatient Encounter Prescriptions as of  06/12/2014  Medication Sig  . acetaminophen (TYLENOL) 500 MG tablet Take 500 mg by mouth daily as needed for pain.  Marland Kitchen buPROPion (WELLBUTRIN XL) 300 MG 24 hr tablet Take 1 tablet (300 mg total) by mouth daily.  . folic acid (FOLVITE) 1 MG tablet Take 1 mg by mouth 2 (two) times daily.  . hydroxyprogesterone caproate (DELALUTIN) 250 mg/mL OIL injection Inject 250 mg into the muscle once a week.  . ondansetron (ZOFRAN-ODT) 8 MG disintegrating tablet Take 4 mg by mouth every 8 (eight) hours as needed for nausea or vomiting.  . Prenatal Vit-Fe Fumarate-FA (PRENATAL MULTIVITAMIN) TABS Take 1 tablet by mouth at bedtime.   . promethazine (PHENERGAN) 25 MG tablet Take 12.5 mg by mouth every 6 (six) hours as needed for nausea or vomiting.  . sertraline (ZOLOFT) 100 MG tablet Take 1.5 tablets (150 mg total) by mouth daily.    Past Psychiatric History/Hospitalization(s): Anxiety: Yes Bipolar Disorder: No Depression: Yes Mania: No Psychosis: No Schizophrenia: No Personality Disorder: No Hospitalization for psychiatric illness: No History of Electroconvulsive Shock Therapy: No Prior Suicide Attempts: No  Physical Exam: Constitutional:  BP 123/73 mmHg  Pulse 96  Ht 5' 0.75" (1.543 m)  Wt 179 lb 3.2 oz (81.285 kg)  BMI 34.14 kg/m2  LMP 11/03/2013  General Appearance: alert, oriented, no acute distress and well nourished  Musculoskeletal: Strength & Muscle Tone: within normal limits Gait & Station: normal Patient leans: N/A  Mental Status Examination/Evaluation: Objective: Attitude: Calm and cooperative  Appearance: Casual, appears to be stated age  Eye Contact::  Good  Speech:  Clear and Coherent and Normal Rate  Volume:  Normal  Mood:  euthymic  Affect:  Full Range   Thought Process:  Intact, Linear and Logical  Orientation:  Full (Time, Place, and Person)  Thought Content:  Negative  Suicidal Thoughts:  No  Homicidal Thoughts:  No  Judgement:  Fair  Insight:  Fair   Concentration: good  Memory: Immediate-fair Recent-fair Remote-fair  Recall: fair  Language: fair  Gait and Station: normal  [employer] of Knowledge: average  Psychomotor Activity:  Normal  Akathisia:  No  Handed:  Right  AIMS (if indicated): n/a        Medical Decision Making (Choose Three): Established Problem, Stable/Improving (1), Review of Psycho-Social Stressors (1), Review of Last Therapy Session (1) and Review of Medication Regimen & Side Effects (2)   Assessment: Axis I: GAD, MDD- recurrent, moderate Axis II: deferred  Axis III:  Past Medical History  Diagnosis Date  . Depression     sees Jorje Guild PA at [facility]  . GERD (gastroesophageal reflux disease)   . HPV (human papilloma virus) infection   . Anxiety   . Abnormal Pap smear of cervix   . Gynecological examination     sees Dr. Henderson Cloud  . Tracheomalacia, congenital   . Brachydactyly of fingers   . Rheumatoid arthritis(714.0)     sees Dr. Azzie Roup   . Pregnancy     due August 10, 2014    Axis IV: anxiety,  work stress, marital stressrors Axis V: GAF of 75    Plan: Continue Zoloft 150 mg daily for anxiety and depression Continue Wellbutrin XL 300mg  daily for depression -Risks and benefits, side effects and alternatives discussed with patient, pt was given an opportunity to ask questions about medication, illness, and treatment. All current psychiatric medications have been reviewed and discussed with the patient and adjusted as clinically appropriate. The patient has been provided an accurate and updated list of the medications being now prescribed. -pt is aware that Wellbutrin and Zoloft are category C medications and that risk of birth defects can not be ruled out. Pt is pregnant and has continued to take meds and is under the care of an Ob/gyn. We discussed the risk of teratogeny. Pt verbalized understanding about the risk and opted to continue treatment with both medications.   No  new labs today  Refered to therapist for depression and anxiety. Pt will be calling for appt soon.She has not yet set up an appts   Therapy: brief supportive therapy provided. Discussed psychosocial stressors in detail. Encouraged to continue exercise.  Pt denies SI and is at an acute low risk for suicide.Patient told to call clinic if any problems occur. Patient advised to go to ER if they should develop SI/HI, side effects, or if symptoms worsen. Has crisis numbers to call if needed. Pt verbalized understanding.  F/up in 3 months or sooner if needed  , MD 06/12/2014

## 2014-06-20 ENCOUNTER — Ambulatory Visit (INDEPENDENT_AMBULATORY_CARE_PROVIDER_SITE_OTHER): Payer: 59 | Admitting: Psychology

## 2014-06-20 ENCOUNTER — Encounter (HOSPITAL_COMMUNITY): Payer: Self-pay | Admitting: Psychology

## 2014-06-20 DIAGNOSIS — F331 Major depressive disorder, recurrent, moderate: Secondary | ICD-10-CM

## 2014-06-20 DIAGNOSIS — F411 Generalized anxiety disorder: Secondary | ICD-10-CM
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
## 2014-07-03 ENCOUNTER — Inpatient Hospital Stay (HOSPITAL_COMMUNITY)
Admission: AD | Admit: 2014-07-03 | Discharge: 2014-07-04 | DRG: 775 | Disposition: A | Discharge status: Home / Self Care | Payer: 59 | Source: Ambulatory Visit | Attending: Obstetrics and Gynecology | Admitting: Obstetrics and Gynecology

## 2014-07-03 ENCOUNTER — Encounter (HOSPITAL_COMMUNITY): Payer: Self-pay | Admitting: *Deleted

## 2014-07-03 DIAGNOSIS — Z981 Arthrodesis status: Secondary | ICD-10-CM | POA: Diagnosis not present

## 2014-07-03 DIAGNOSIS — K219 Gastro-esophageal reflux disease without esophagitis: Secondary | ICD-10-CM | POA: Diagnosis present

## 2014-07-03 DIAGNOSIS — O99613 Diseases of the digestive system complicating pregnancy, third trimester: Secondary | ICD-10-CM | POA: Diagnosis present

## 2014-07-03 DIAGNOSIS — Z87891 Personal history of nicotine dependence: Secondary | ICD-10-CM | POA: Diagnosis not present

## 2014-07-03 DIAGNOSIS — Z3A34 34 weeks gestation of pregnancy: Secondary | ICD-10-CM | POA: Diagnosis present

## 2014-07-03 DIAGNOSIS — Z79899 Other long term (current) drug therapy: Secondary | ICD-10-CM

## 2014-07-03 DIAGNOSIS — O09213 Supervision of pregnancy with history of pre-term labor, third trimester: Secondary | ICD-10-CM

## 2014-07-03 LAB — URINALYSIS, ROUTINE W REFLEX MICROSCOPIC
BILIRUBIN URINE: NEGATIVE
Glucose, UA: NEGATIVE mg/dL
HGB URINE DIPSTICK: NEGATIVE
Ketones, ur: NEGATIVE mg/dL
Nitrite: NEGATIVE
PH: 6.5 (ref 5.0–8.0)
Protein, ur: NEGATIVE mg/dL
Specific Gravity, Urine: 1.02 (ref 1.005–1.030)
Urobilinogen, UA: 0.2 mg/dL (ref 0.0–1.0)

## 2014-07-03 LAB — CBC
HCT: 33.4 % — ABNORMAL LOW (ref 36.0–46.0)
HEMOGLOBIN: 11.2 g/dL — AB (ref 12.0–15.0)
MCH: 30.9 pg (ref 26.0–34.0)
MCHC: 33.5 g/dL (ref 30.0–36.0)
MCV: 92 fL (ref 78.0–100.0)
Platelets: 130 10*3/uL — ABNORMAL LOW (ref 150–400)
RBC: 3.63 MIL/uL — AB (ref 3.87–5.11)
RDW: 13.5 % (ref 11.5–15.5)
WBC: 12.1 10*3/uL — ABNORMAL HIGH (ref 4.0–10.5)

## 2014-07-03 LAB — URINE MICROSCOPIC-ADD ON

## 2014-07-03 LAB — GROUP B STREP BY PCR: Group B strep by PCR: NEGATIVE

## 2014-07-03 LAB — RAPID STREP SCREEN (MED CTR MEBANE ONLY): Streptococcus, Group A Screen (Direct): NEGATIVE

## 2014-07-03 LAB — OB RESULTS CONSOLE GBS: GBS: NEGATIVE

## 2014-07-03 LAB — TYPE AND SCREEN
ABO/RH(D): O POS
ANTIBODY SCREEN: NEGATIVE

## 2014-07-03 MED ORDER — PROMETHAZINE HCL 25 MG PO TABS
25.0000 mg | ORAL_TABLET | Freq: Once | ORAL | Status: AC
  Administered 2014-07-03: 25 mg via ORAL
  Filled 2014-07-03: qty 1

## 2014-07-03 MED ORDER — LACTATED RINGERS IV SOLN
INTRAVENOUS | Status: DC
  Administered 2014-07-03 (×2): via INTRAVENOUS

## 2014-07-03 MED ORDER — SERTRALINE HCL 50 MG PO TABS
150.0000 mg | ORAL_TABLET | Freq: Every day | ORAL | Status: DC
  Administered 2014-07-04: 150 mg via ORAL
  Filled 2014-07-03: qty 1

## 2014-07-03 MED ORDER — LACTATED RINGERS IV SOLN
500.0000 mL | INTRAVENOUS | Status: DC | PRN
  Administered 2014-07-03: 500 mL via INTRAVENOUS

## 2014-07-03 MED ORDER — TERBUTALINE SULFATE 1 MG/ML IJ SOLN
0.2500 mg | Freq: Once | INTRAMUSCULAR | Status: AC
  Administered 2014-07-03: 0.25 mg via SUBCUTANEOUS
  Filled 2014-07-03: qty 1

## 2014-07-03 MED ORDER — ONDANSETRON HCL 4 MG/2ML IJ SOLN
4.0000 mg | Freq: Four times a day (QID) | INTRAMUSCULAR | Status: DC | PRN
  Administered 2014-07-03: 4 mg via INTRAVENOUS
  Filled 2014-07-03: qty 2

## 2014-07-03 MED ORDER — PENICILLIN G POTASSIUM 5000000 UNITS IJ SOLR
2.5000 10*6.[IU] | INTRAVENOUS | Status: DC
  Administered 2014-07-03: 2.5 10*6.[IU] via INTRAVENOUS
  Filled 2014-07-03 (×6): qty 2.5

## 2014-07-03 MED ORDER — PHENYLEPHRINE 40 MCG/ML (10ML) SYRINGE FOR IV PUSH (FOR BLOOD PRESSURE SUPPORT): 80.0000 ug | PREFILLED_SYRINGE | INTRAVENOUS | Status: DC | PRN

## 2014-07-03 MED ORDER — ZOLPIDEM TARTRATE 5 MG PO TABS
5.0000 mg | ORAL_TABLET | Freq: Every evening | ORAL | Status: DC | PRN
  Administered 2014-07-03: 5 mg via ORAL
  Filled 2014-07-03: qty 1

## 2014-07-03 MED ORDER — EPHEDRINE 5 MG/ML INJ
10.0000 mg | INTRAVENOUS | Status: DC | PRN
Start: 2014-07-03 — End: 2014-07-04

## 2014-07-03 MED ORDER — DIPHENHYDRAMINE HCL 50 MG/ML IJ SOLN: 12.5000 mg | INTRAMUSCULAR | Status: DC | PRN

## 2014-07-03 MED ORDER — EPHEDRINE 5 MG/ML INJ: 10.0000 mg | INTRAVENOUS | Status: DC | PRN

## 2014-07-03 MED ORDER — CITRIC ACID-SODIUM CITRATE 334-500 MG/5ML PO SOLN: 30.0000 mL | ORAL | Status: DC | PRN

## 2014-07-03 MED ORDER — LIDOCAINE HCL (PF) 1 % IJ SOLN: 30.0000 mL | INTRAMUSCULAR | Status: DC | PRN

## 2014-07-03 MED ORDER — OXYTOCIN 40 UNITS IN LACTATED RINGERS INFUSION - SIMPLE MED: 62.5000 mL/h | INTRAVENOUS | Status: DC

## 2014-07-03 MED ORDER — PENICILLIN G POTASSIUM 5000000 UNITS IJ SOLR
5.0000 10*6.[IU] | Freq: Once | INTRAVENOUS | Status: AC
  Administered 2014-07-03: 5 10*6.[IU] via INTRAVENOUS
  Filled 2014-07-03: qty 5

## 2014-07-03 MED ORDER — ACETAMINOPHEN 325 MG PO TABS
650.0000 mg | ORAL_TABLET | Freq: Four times a day (QID) | ORAL | Status: DC | PRN
  Administered 2014-07-03: 650 mg via ORAL
  Filled 2014-07-03: qty 2

## 2014-07-03 MED ORDER — BUPROPION HCL ER (XL) 300 MG PO TB24
300.0000 mg | ORAL_TABLET | Freq: Every day | ORAL | Status: DC
  Administered 2014-07-04: 300 mg via ORAL
  Filled 2014-07-03: qty 1

## 2014-07-03 MED ORDER — OXYTOCIN BOLUS FROM INFUSION: 500.0000 mL | INTRAVENOUS | Status: DC

## 2014-07-03 MED ORDER — NIFEDIPINE 10 MG PO CAPS
10.0000 mg | ORAL_CAPSULE | Freq: Three times a day (TID) | ORAL | Status: DC
  Administered 2014-07-03 – 2014-07-04 (×3): 10 mg via ORAL
  Filled 2014-07-03 (×3): qty 1

## 2014-07-03 MED ORDER — LACTATED RINGERS IV SOLN: 500.0000 mL | Freq: Once | INTRAVENOUS | Status: DC

## 2014-07-03 MED ORDER — FENTANYL 2.5 MCG/ML BUPIVACAINE 1/10 % EPIDURAL INFUSION (WH - ANES): 14.0000 mL/h | INTRAMUSCULAR | Status: DC | PRN

## 2014-07-03 NOTE — Progress Notes (Signed)
Per MAU earlier, was felt to be 2-3/50, ctx have calmed down  By my exam>>1+/posterior  Will try PO nifedipine and watch overnight to follow since [redacted]w[redacted]d

## 2014-07-03 NOTE — MAU Provider Note (Signed)
History     CSN: [id number]  Arrival date and time: 07/03/14 1044   None     Chief Complaint  Patient presents with  . Contractions   HPI  29 y.o. G2P0101 @[redacted]w[redacted]d  presents to the MAU from the office. She is contracting; denies vaginal bleeding, LOF and reports positive fetal movement.  Pertinent hx includes preterm delivery at 35 weeks with previous pregnancy.  Past Medical History  Diagnosis Date  . Depression     sees PA at [facility]  . GERD (gastroesophageal reflux disease)   . HPV (human papilloma virus) infection   . Anxiety   . Abnormal Pap smear of cervix   . Gynecological examination     sees Dr. [facility]  . Tracheomalacia, congenital   . Brachydactyly of fingers   . Rheumatoid arthritis(714.0)     sees Dr. Henderson Cloud   . Pregnancy     due August 10, 2014    Past Surgical History  Procedure Laterality Date  . Hand surgery  1996    to elongate the right 3rd metacarpal   . Lumbar fusion  2008    per Dr. 2009 on L4-5 for spondylolisthesis     Family History  Problem Relation Age of Onset  . Arthritis Mother   . Hyperlipidemia Father   . Heart disease Father   . Hypertension Father   . Arthritis Father   . Depression Father   . Anxiety disorder Father   . Arthritis Brother   . Psoriasis Brother   . Cleft lip Brother     without palate  . Uterine cancer Maternal Aunt     x 2  . Seizures Maternal Aunt   . Mental retardation Maternal Aunt     mild  . Colon cancer Neg Hx   . Seizures Cousin   . Depression Cousin     attempted suicide  . Suicidality Cousin     History  Substance Use Topics  . Smoking status: Former Smoker -- 0.50 packs/day    Types: Cigarettes    Quit date: 08/11/2007  . Smokeless tobacco: Never Used  . Alcohol Use: No     Comment: no longer drinking, previous was 1 glass of wine 3x/week    Allergies:  Allergies  Allergen Reactions  . Nutmeg Oil (Myristica Oil) Hives  . Vicodin  [Hydrocodone-Acetaminophen] Itching    Prescriptions prior to admission  Medication Sig Dispense Refill Last Dose  . buPROPion (WELLBUTRIN XL) 300 MG 24 hr tablet Take 1 tablet (300 mg total) by mouth daily. 30 tablet 2 07/03/2014 at Unknown time  . folic acid (FOLVITE) 1 MG tablet Take 1 mg by mouth 2 (two) times daily.   07/03/2014 at Unknown time  . hydroxyprogesterone caproate (DELALUTIN) 250 mg/mL OIL injection Inject 250 mg into the muscle once a week.   Past Week at Unknown time  . Prenatal Vit-Fe Fumarate-FA (PRENATAL MULTIVITAMIN) TABS Take 1 tablet by mouth at bedtime.    Past Week at Unknown time  . sertraline (ZOLOFT) 100 MG tablet Take 1.5 tablets (150 mg total) by mouth daily. 45 tablet 2 07/03/2014 at Unknown time  . acetaminophen (TYLENOL) 500 MG tablet Take 500 mg by mouth daily as needed for pain.   Unknown at Unknown time    Review of Systems  Constitutional: Negative.   Eyes: Negative.   Respiratory: Negative for shortness of breath.   Cardiovascular: Negative.   Gastrointestinal: Positive for abdominal pain.  Genitourinary: Negative.  Musculoskeletal: Negative.   Skin: Negative.   Neurological: Negative.   Endo/Heme/Allergies: Negative.   Psychiatric/Behavioral: Negative.    Physical Exam   Blood pressure 131/72, pulse 131, temperature 99.5 F (37.5 C), temperature source Oral, resp. rate 18, height 5' (1.524 m), weight 84.823 kg (187 lb), last menstrual period 11/03/2013, currently breastfeeding.  Physical Exam  Constitutional: She is oriented to person, place, and time. She appears well-developed and well-nourished. No distress.  HENT:  Head: Normocephalic and atraumatic.  Neck: Normal range of motion.  Cardiovascular: Normal rate and regular rhythm.   Respiratory: Effort normal. No respiratory distress.  GI: Soft. She exhibits no distension and no mass. There is no tenderness. There is no rebound and no guarding.  Genitourinary: Vagina normal.   Musculoskeletal: Normal range of motion.  Neurological: She is alert and oriented to person, place, and time.  Skin: Skin is warm and dry.  Psychiatric: She has a normal mood and affect. Her behavior is normal. Judgment and thought content normal.   VE- 1/50/-2 per me Uc's q 2-3 minutes  MAU Course  Procedures  MDM Terbutaline 0.25mg  SQ PO Fluids EFM Phenergan 25mg  PO Recheck cervix for change  Assessment and Plan  Preterm Labor FHT's Cat 1 Nausea    Clemmons,Lori Grissett 07/03/2014, 12:25 PM   Assumed care from [facility], CNM.    Pt reports contractions unchanged after dose of terbutaline SQ administered.  Nausea improved after Phenergan dose.  Dilation: 2.5 Effacement (%): 50 Station: -3 Exam by:: lisa letwich-Kirby CNM  Consult Dr [phone] Admit to [facility] for preterm labor at [redacted]w[redacted]d Routine labor orders Rapid GBS pending   LEFTWICH-KIRBY, LISA, CNM 1:40 PM

## 2014-07-03 NOTE — MAU Note (Signed)
Pt stated she started having cramping and increased pelvic pressure around 8:30 this morning. Went to appointment. SVE closed/soft. Denies andy leaking or bleeding at this time

## 2014-07-03 NOTE — Progress Notes (Signed)
Assisting with admission. Noted Maternal and fetal heart rate tachycardia. Pt received terb at 1230 for PTL. IV fluid bolus, will cont to monitor and notify MD if both remain tachy.

## 2014-07-03 NOTE — H&P (Signed)
NAMERYEN, HEITMEYER              ACCOUNT NO.:  [id number]  MEDICAL RECORD NO.:  [id number]  LOCATION:  9160                          FACILITY:  [facility]  PHYSICIAN:  Duke Salvia. Marcelle Overlie, M.D.DATE OF BIRTH:  18-Jun-1985  DATE OF ADMISSION:  07/03/2014 DATE OF DISCHARGE:                             HISTORY & PHYSICAL   CHIEF COMPLAINT:  Labor.  HPI:  A 29 year old, G2, P 0-1-0-1, EDD April 17, 34 and 4/7th weeks, seen in the office earlier today complaining of increasing contractions that seemed to be getting more intense.  She was a fingertip dilated. She has been receiving weekly progesterone because of a prior history of preterm delivery in 2014, a 6 pounds 4 ounce female at 47 and 1/7th weeks. She was sent to MAU for further observation, received terbutaline subcu 1 dose with some p.o. fluids, continued to have significant increase in contractions with a change in her cervix to 2-3 cm, admitted now in labor.  Rapid group B strep was done in MAU and is pending.  1 hour GTT was 115.  PAST MEDICAL HISTORY:  Please see the [place] form for details.  Her blood type is O positive.  SHE IS ALLERGIC TO VICODIN.  She has had a prior lumbar fusion in 2008.  With a history of taking prenatal vitamins, Wellbutrin, and Zoloft.  PHYSICAL EXAM:  VITAL SIGNS:  Temp 98.2, blood pressure 120/70. HEENT:  Unremarkable. NECK:  Supple without masses. LUNGS:  Clear. CARDIOVASCULAR:  Regular rate and rhythm without murmurs, rubs, gallops noted. BREASTS:  Not examined. ABDOMEN:  36 cm fundal height.  Fetal heart rate 140, cervix was 2-3, 50%, vertex.  Membranes intact. EXTREMITIES:  Neurologic exam unremarkable.  IMPRESSION:  A 34 and 4/7th weeks gestation, active labor.  PLAN:  Anticipate vaginal delivery.  Rapid GBS is pending.     [name] M. Marcelle Overlie, M.D.     RMH/MEDQ  D:  07/03/2014  T:  07/03/2014  Job:  [id number]

## 2014-07-03 NOTE — H&P (Signed)
YEN WANDELL  DICTATION # [id number] CSN# [id number]   Meriel Pica, MD 07/03/2014 2:01 PM

## 2014-07-04 ENCOUNTER — Encounter (HOSPITAL_COMMUNITY): Payer: Self-pay | Admitting: *Deleted

## 2014-07-04 ENCOUNTER — Inpatient Hospital Stay (HOSPITAL_COMMUNITY)
Admission: AD | Admit: 2014-07-04 | Discharge: 2014-07-07 | DRG: 774 | Disposition: A | Discharge status: Home / Self Care | Payer: 59 | Source: Ambulatory Visit | Attending: Obstetrics and Gynecology | Admitting: Obstetrics and Gynecology

## 2014-07-04 DIAGNOSIS — M069 Rheumatoid arthritis, unspecified: Secondary | ICD-10-CM | POA: Diagnosis present

## 2014-07-04 DIAGNOSIS — O9989 Other specified diseases and conditions complicating pregnancy, childbirth and the puerperium: Secondary | ICD-10-CM | POA: Diagnosis present

## 2014-07-04 DIAGNOSIS — Z87891 Personal history of nicotine dependence: Secondary | ICD-10-CM

## 2014-07-04 DIAGNOSIS — F419 Anxiety disorder, unspecified: Secondary | ICD-10-CM | POA: Diagnosis present

## 2014-07-04 DIAGNOSIS — O4700 False labor before 37 completed weeks of gestation, unspecified trimester: Secondary | ICD-10-CM | POA: Diagnosis present

## 2014-07-04 DIAGNOSIS — K219 Gastro-esophageal reflux disease without esophagitis: Secondary | ICD-10-CM | POA: Diagnosis present

## 2014-07-04 DIAGNOSIS — O9962 Diseases of the digestive system complicating childbirth: Secondary | ICD-10-CM | POA: Diagnosis present

## 2014-07-04 DIAGNOSIS — Z981 Arthrodesis status: Secondary | ICD-10-CM

## 2014-07-04 DIAGNOSIS — Q320 Congenital tracheomalacia: Secondary | ICD-10-CM

## 2014-07-04 DIAGNOSIS — O9832 Other infections with a predominantly sexual mode of transmission complicating childbirth: Secondary | ICD-10-CM | POA: Diagnosis present

## 2014-07-04 DIAGNOSIS — Z3A34 34 weeks gestation of pregnancy: Secondary | ICD-10-CM | POA: Diagnosis present

## 2014-07-04 DIAGNOSIS — A630 Anogenital (venereal) warts: Secondary | ICD-10-CM | POA: Diagnosis present

## 2014-07-04 DIAGNOSIS — O479 False labor, unspecified: Secondary | ICD-10-CM | POA: Diagnosis present

## 2014-07-04 DIAGNOSIS — O99344 Other mental disorders complicating childbirth: Secondary | ICD-10-CM | POA: Diagnosis present

## 2014-07-04 DIAGNOSIS — F329 Major depressive disorder, single episode, unspecified: Secondary | ICD-10-CM | POA: Diagnosis present

## 2014-07-04 LAB — RPR: RPR: NONREACTIVE

## 2014-07-04 MED ORDER — NIFEDIPINE 10 MG PO CAPS: 10.0000 mg | ORAL_CAPSULE | ORAL | Status: DC | PRN

## 2014-07-04 MED ORDER — NIFEDIPINE 10 MG PO CAPS
10.0000 mg | ORAL_CAPSULE | Freq: Once | ORAL | Status: AC
  Administered 2014-07-04: 10 mg via ORAL
  Filled 2014-07-04: qty 1

## 2014-07-04 NOTE — Progress Notes (Signed)
Dc instructions given to pt no questions or co's noted

## 2014-07-04 NOTE — Progress Notes (Signed)
Pt to BS from [place] for further eval of labor.

## 2014-07-04 NOTE — MAU Note (Signed)
Just since home this am from [facility]. Taking Procardia 10mg  q6hrs. Last dose 2000. Denies LOF or bleeding. Was 1cm and 80percent last ck. First delivery at 35wks.

## 2014-07-04 NOTE — Discharge Instructions (Signed)

## 2014-07-04 NOTE — Progress Notes (Signed)
Patient is reporting contractions have spaced.  FHR Category 1  Cervix is 80%/ posterior / 1 cm / -2 Vertex  IMPRESSION: IUP at 34 w 5 days Preterm contractions  PLAN: Had extensive discussion with patient and her husband. No cervical change, contractions have spaced Recommend discharge home on bedrest Continue procardia Follow up on Monday

## 2014-07-04 NOTE — Progress Notes (Signed)
Spoke with Dr. Vincente Poli concerning contraction, cervical exam, and fetal activity. One dose of Procardia ordered. Patient is to be observed for 2 hours then rechecked. If patient makes no change, then patient is to be discharged.

## 2014-07-05 ENCOUNTER — Inpatient Hospital Stay (HOSPITAL_COMMUNITY): Payer: 59 | Admitting: Anesthesiology

## 2014-07-05 ENCOUNTER — Encounter (HOSPITAL_COMMUNITY): Payer: Self-pay | Admitting: Anesthesiology

## 2014-07-05 DIAGNOSIS — O9989 Other specified diseases and conditions complicating pregnancy, childbirth and the puerperium: Secondary | ICD-10-CM | POA: Diagnosis present

## 2014-07-05 DIAGNOSIS — O9962 Diseases of the digestive system complicating childbirth: Secondary | ICD-10-CM | POA: Diagnosis present

## 2014-07-05 DIAGNOSIS — Z981 Arthrodesis status: Secondary | ICD-10-CM | POA: Diagnosis not present

## 2014-07-05 DIAGNOSIS — Q320 Congenital tracheomalacia: Secondary | ICD-10-CM | POA: Diagnosis not present

## 2014-07-05 DIAGNOSIS — Z3A34 34 weeks gestation of pregnancy: Secondary | ICD-10-CM | POA: Diagnosis present

## 2014-07-05 DIAGNOSIS — Z87891 Personal history of nicotine dependence: Secondary | ICD-10-CM | POA: Diagnosis not present

## 2014-07-05 DIAGNOSIS — O4700 False labor before 37 completed weeks of gestation, unspecified trimester: Secondary | ICD-10-CM | POA: Diagnosis present

## 2014-07-05 DIAGNOSIS — M069 Rheumatoid arthritis, unspecified: Secondary | ICD-10-CM | POA: Diagnosis present

## 2014-07-05 DIAGNOSIS — K219 Gastro-esophageal reflux disease without esophagitis: Secondary | ICD-10-CM | POA: Diagnosis present

## 2014-07-05 DIAGNOSIS — O99344 Other mental disorders complicating childbirth: Secondary | ICD-10-CM | POA: Diagnosis present

## 2014-07-05 DIAGNOSIS — F329 Major depressive disorder, single episode, unspecified: Secondary | ICD-10-CM | POA: Diagnosis present

## 2014-07-05 DIAGNOSIS — N858 Other specified noninflammatory disorders of uterus: Secondary | ICD-10-CM | POA: Diagnosis present

## 2014-07-05 DIAGNOSIS — O479 False labor, unspecified: Secondary | ICD-10-CM | POA: Diagnosis present

## 2014-07-05 DIAGNOSIS — F419 Anxiety disorder, unspecified: Secondary | ICD-10-CM | POA: Diagnosis present

## 2014-07-05 DIAGNOSIS — O9832 Other infections with a predominantly sexual mode of transmission complicating childbirth: Secondary | ICD-10-CM | POA: Diagnosis present

## 2014-07-05 DIAGNOSIS — A630 Anogenital (venereal) warts: Secondary | ICD-10-CM | POA: Diagnosis present

## 2014-07-05 LAB — CBC
HEMATOCRIT: 32 % — AB (ref 36.0–46.0)
Hemoglobin: 10.8 g/dL — ABNORMAL LOW (ref 12.0–15.0)
MCH: 31.2 pg (ref 26.0–34.0)
MCHC: 33.8 g/dL (ref 30.0–36.0)
MCV: 92.5 fL (ref 78.0–100.0)
Platelets: 147 10*3/uL — ABNORMAL LOW (ref 150–400)
RBC: 3.46 MIL/uL — AB (ref 3.87–5.11)
RDW: 13.7 % (ref 11.5–15.5)
WBC: 9.3 10*3/uL (ref 4.0–10.5)

## 2014-07-05 LAB — TYPE AND SCREEN
ABO/RH(D): O POS
ANTIBODY SCREEN: NEGATIVE

## 2014-07-05 LAB — URINALYSIS, ROUTINE W REFLEX MICROSCOPIC
Bilirubin Urine: NEGATIVE
GLUCOSE, UA: NEGATIVE mg/dL
HGB URINE DIPSTICK: NEGATIVE
KETONES UR: NEGATIVE mg/dL
Leukocytes, UA: NEGATIVE
Nitrite: NEGATIVE
PH: 6 (ref 5.0–8.0)
Protein, ur: NEGATIVE mg/dL
Specific Gravity, Urine: 1.01 (ref 1.005–1.030)
UROBILINOGEN UA: 0.2 mg/dL (ref 0.0–1.0)

## 2014-07-05 LAB — CULTURE, GROUP A STREP: Strep A Culture: NEGATIVE

## 2014-07-05 MED ORDER — TERBUTALINE SULFATE 1 MG/ML IJ SOLN: 0.2500 mg | Freq: Once | INTRAMUSCULAR | Status: DC | PRN

## 2014-07-05 MED ORDER — MEASLES, MUMPS & RUBELLA VAC ~~LOC~~ INJ
0.5000 mL | INJECTION | Freq: Once | SUBCUTANEOUS | Status: DC
  Filled 2014-07-05: qty 0.5

## 2014-07-05 MED ORDER — EPHEDRINE 5 MG/ML INJ
10.0000 mg | INTRAVENOUS | Status: DC | PRN
  Filled 2014-07-05: qty 2

## 2014-07-05 MED ORDER — OXYTOCIN BOLUS FROM INFUSION: 500.0000 mL | INTRAVENOUS | Status: DC

## 2014-07-05 MED ORDER — OXYTOCIN 40 UNITS IN LACTATED RINGERS INFUSION - SIMPLE MED
1.0000 m[IU]/min | INTRAVENOUS | Status: DC
  Administered 2014-07-05: 2 m[IU]/min via INTRAVENOUS

## 2014-07-05 MED ORDER — TETANUS-DIPHTH-ACELL PERTUSSIS 5-2.5-18.5 LF-MCG/0.5 IM SUSP: 0.5000 mL | Freq: Once | INTRAMUSCULAR | Status: DC

## 2014-07-05 MED ORDER — ZOLPIDEM TARTRATE 5 MG PO TABS: 5.0000 mg | ORAL_TABLET | Freq: Every evening | ORAL | Status: DC | PRN

## 2014-07-05 MED ORDER — LANOLIN HYDROUS EX OINT: TOPICAL_OINTMENT | CUTANEOUS | Status: DC | PRN

## 2014-07-05 MED ORDER — OXYCODONE-ACETAMINOPHEN 5-325 MG PO TABS
1.0000 | ORAL_TABLET | ORAL | Status: DC | PRN
  Administered 2014-07-06 (×3): 1 via ORAL
  Filled 2014-07-05 (×3): qty 1

## 2014-07-05 MED ORDER — DIPHENHYDRAMINE HCL 25 MG PO CAPS: 25.0000 mg | ORAL_CAPSULE | Freq: Four times a day (QID) | ORAL | Status: DC | PRN

## 2014-07-05 MED ORDER — FENTANYL 2.5 MCG/ML BUPIVACAINE 1/10 % EPIDURAL INFUSION (WH - ANES)
INTRAMUSCULAR | Status: DC | PRN
  Administered 2014-07-05: 14 mL/h via EPIDURAL

## 2014-07-05 MED ORDER — LIDOCAINE HCL (PF) 1 % IJ SOLN
INTRAMUSCULAR | Status: DC | PRN
  Administered 2014-07-05 (×2): 4 mL

## 2014-07-05 MED ORDER — ONDANSETRON HCL 4 MG/2ML IJ SOLN
4.0000 mg | Freq: Four times a day (QID) | INTRAMUSCULAR | Status: DC | PRN
  Administered 2014-07-05: 4 mg via INTRAVENOUS
  Filled 2014-07-05: qty 2

## 2014-07-05 MED ORDER — FLEET ENEMA 7-19 GM/118ML RE ENEM: 1.0000 | ENEMA | Freq: Every day | RECTAL | Status: DC | PRN

## 2014-07-05 MED ORDER — LACTATED RINGERS IV SOLN: 500.0000 mL | Freq: Once | INTRAVENOUS | Status: DC

## 2014-07-05 MED ORDER — ONDANSETRON HCL 4 MG/2ML IJ SOLN: 4.0000 mg | INTRAMUSCULAR | Status: DC | PRN

## 2014-07-05 MED ORDER — IBUPROFEN 600 MG PO TABS
600.0000 mg | ORAL_TABLET | Freq: Four times a day (QID) | ORAL | Status: DC
  Administered 2014-07-05 – 2014-07-07 (×8): 600 mg via ORAL
  Filled 2014-07-05 (×8): qty 1

## 2014-07-05 MED ORDER — LACTATED RINGERS IV SOLN
INTRAVENOUS | Status: DC
  Administered 2014-07-05 (×2): via INTRAVENOUS

## 2014-07-05 MED ORDER — OXYCODONE-ACETAMINOPHEN 5-325 MG PO TABS: 2.0000 | ORAL_TABLET | ORAL | Status: DC | PRN

## 2014-07-05 MED ORDER — SENNOSIDES-DOCUSATE SODIUM 8.6-50 MG PO TABS
2.0000 | ORAL_TABLET | ORAL | Status: DC
  Administered 2014-07-05 – 2014-07-06 (×2): 2 via ORAL
  Filled 2014-07-05 (×2): qty 2

## 2014-07-05 MED ORDER — FENTANYL 2.5 MCG/ML BUPIVACAINE 1/10 % EPIDURAL INFUSION (WH - ANES)
14.0000 mL/h | INTRAMUSCULAR | Status: DC | PRN
  Administered 2014-07-05 (×2): 14 mL/h via EPIDURAL
  Filled 2014-07-05 (×2): qty 125

## 2014-07-05 MED ORDER — CITRIC ACID-SODIUM CITRATE 334-500 MG/5ML PO SOLN: 30.0000 mL | ORAL | Status: DC | PRN

## 2014-07-05 MED ORDER — OXYCODONE-ACETAMINOPHEN 5-325 MG PO TABS: 1.0000 | ORAL_TABLET | ORAL | Status: DC | PRN

## 2014-07-05 MED ORDER — BUTORPHANOL TARTRATE 1 MG/ML IJ SOLN
2.0000 mg | Freq: Once | INTRAMUSCULAR | Status: AC
  Administered 2014-07-05: 2 mg via INTRAVENOUS
  Filled 2014-07-05: qty 2

## 2014-07-05 MED ORDER — OXYTOCIN 40 UNITS IN LACTATED RINGERS INFUSION - SIMPLE MED
62.5000 mL/h | INTRAVENOUS | Status: DC
  Filled 2014-07-05: qty 1000

## 2014-07-05 MED ORDER — SERTRALINE HCL 100 MG PO TABS
150.0000 mg | ORAL_TABLET | Freq: Every day | ORAL | Status: DC
  Administered 2014-07-06 – 2014-07-07 (×2): 150 mg via ORAL
  Filled 2014-07-05 (×3): qty 1

## 2014-07-05 MED ORDER — WITCH HAZEL-GLYCERIN EX PADS: 1.0000 "application " | MEDICATED_PAD | CUTANEOUS | Status: DC | PRN

## 2014-07-05 MED ORDER — ZOLPIDEM TARTRATE 5 MG PO TABS
5.0000 mg | ORAL_TABLET | Freq: Once | ORAL | Status: AC
  Administered 2014-07-05: 5 mg via ORAL
  Filled 2014-07-05: qty 1

## 2014-07-05 MED ORDER — ACETAMINOPHEN 325 MG PO TABS: 650.0000 mg | ORAL_TABLET | ORAL | Status: DC | PRN

## 2014-07-05 MED ORDER — LIDOCAINE HCL (PF) 1 % IJ SOLN
30.0000 mL | INTRAMUSCULAR | Status: DC | PRN
  Filled 2014-07-05: qty 30

## 2014-07-05 MED ORDER — SIMETHICONE 80 MG PO CHEW: 80.0000 mg | CHEWABLE_TABLET | ORAL | Status: DC | PRN

## 2014-07-05 MED ORDER — LACTATED RINGERS IV SOLN: 500.0000 mL | INTRAVENOUS | Status: DC | PRN

## 2014-07-05 MED ORDER — DIBUCAINE 1 % RE OINT: 1.0000 "application " | TOPICAL_OINTMENT | RECTAL | Status: DC | PRN

## 2014-07-05 MED ORDER — PHENYLEPHRINE 40 MCG/ML (10ML) SYRINGE FOR IV PUSH (FOR BLOOD PRESSURE SUPPORT)
80.0000 ug | PREFILLED_SYRINGE | INTRAVENOUS | Status: DC | PRN
  Filled 2014-07-05: qty 2

## 2014-07-05 MED ORDER — ONDANSETRON HCL 4 MG PO TABS: 4.0000 mg | ORAL_TABLET | ORAL | Status: DC | PRN

## 2014-07-05 MED ORDER — SERTRALINE HCL 50 MG PO TABS
150.0000 mg | ORAL_TABLET | Freq: Every day | ORAL | Status: DC
  Filled 2014-07-05 (×2): qty 1

## 2014-07-05 MED ORDER — BENZOCAINE-MENTHOL 20-0.5 % EX AERO
1.0000 "application " | INHALATION_SPRAY | CUTANEOUS | Status: DC | PRN
  Administered 2014-07-05: 1 via TOPICAL
  Filled 2014-07-05: qty 56

## 2014-07-05 MED ORDER — DIPHENHYDRAMINE HCL 50 MG/ML IJ SOLN: 12.5000 mg | INTRAMUSCULAR | Status: DC | PRN

## 2014-07-05 MED ORDER — PRENATAL MULTIVITAMIN CH
1.0000 | ORAL_TABLET | Freq: Every day | ORAL | Status: DC
  Administered 2014-07-06 – 2014-07-07 (×2): 1 via ORAL
  Filled 2014-07-05 (×3): qty 1

## 2014-07-05 MED ORDER — MEDROXYPROGESTERONE ACETATE 150 MG/ML IM SUSP: 150.0000 mg | INTRAMUSCULAR | Status: DC | PRN

## 2014-07-05 MED ORDER — BUPROPION HCL ER (XL) 300 MG PO TB24
300.0000 mg | ORAL_TABLET | Freq: Every day | ORAL | Status: DC
  Administered 2014-07-06 – 2014-07-07 (×2): 300 mg via ORAL
  Filled 2014-07-05 (×4): qty 1

## 2014-07-05 MED ORDER — PHENYLEPHRINE 40 MCG/ML (10ML) SYRINGE FOR IV PUSH (FOR BLOOD PRESSURE SUPPORT)
80.0000 ug | PREFILLED_SYRINGE | INTRAVENOUS | Status: DC | PRN
  Filled 2014-07-05: qty 20
  Filled 2014-07-05: qty 2

## 2014-07-05 MED ORDER — BISACODYL 10 MG RE SUPP: 10.0000 mg | Freq: Every day | RECTAL | Status: DC | PRN

## 2014-07-05 NOTE — Anesthesia Preprocedure Evaluation (Signed)
Anesthesia Evaluation    Airway Mallampati: III  TM Distance: >3 FB Neck ROM: Full    Dental no notable dental hx. (+) Teeth Intact   Pulmonary neg pulmonary ROS, former smoker,  breath sounds clear to auscultation  Pulmonary exam normal       Cardiovascular negative cardio ROS  Rhythm:Regular Rate:Normal     Neuro/Psych PSYCHIATRIC DISORDERS Anxiety Depression negative neurological ROS  negative psych ROS   GI/Hepatic Neg liver ROS, GERD-  Medicated and Controlled,  Endo/Other  Obesity  Renal/GU negative Renal ROS  negative genitourinary   Musculoskeletal  (+) Arthritis -, Rheumatoid disorders,    Abdominal (+) + obese,   Peds  Hematology  (+) anemia , Thrombocytopenia-mild   Anesthesia Other Findings   Reproductive/Obstetrics (+) Pregnancy Hx/o PTL 37 weeks                              Anesthesia Physical Anesthesia Plan  ASA: II  Anesthesia Plan: Epidural   Post-op Pain Management:    Induction:   Airway Management Planned: Natural Airway  Additional Equipment:   Intra-op Plan:   Post-operative Plan:   Informed Consent: I have reviewed the patients History and Physical, chart, labs and discussed the procedure including the risks, benefits and alternatives for the proposed anesthesia with the patient or authorized representative who has indicated his/her understanding and acceptance.     Plan Discussed with: Anesthesiologist  Anesthesia Plan Comments:         Anesthesia Quick Evaluation

## 2014-07-05 NOTE — H&P (Signed)
[name] is a 29 y.o. G 3 P 1 at 16 w 6 days presents in active labor.now receiving epidural Maternal Medical History:  Reason for admission: Contractions.   Contractions: Perceived severity is moderate.    Fetal activity: Perceived fetal activity is normal.      OB History    Gravida Para Term Preterm AB TAB SAB Ectopic Multiple Living   2 1 0 1 0 0 0 0 0 1      Past Medical History  Diagnosis Date  . Depression     sees PA at [facility]  . GERD (gastroesophageal reflux disease)   . HPV (human papilloma virus) infection   . Anxiety   . Abnormal Pap smear of cervix   . Gynecological examination     sees Dr. [facility]  . Tracheomalacia, congenital   . Brachydactyly of fingers   . Rheumatoid arthritis(714.0)     sees Dr. Henderson Cloud   . Pregnancy     due August 10, 2014   Past Surgical History  Procedure Laterality Date  . Hand surgery  1996    to elongate the right 3rd metacarpal   . Lumbar fusion  2008    per Dr. 2009 on L4-5 for spondylolisthesis   . Back surgery     Family History: family history includes Anxiety disorder in her father; Arthritis in her brother, father, and mother; Cleft lip in her brother; Depression in her cousin and father; Heart disease in her father; Hyperlipidemia in her father; Hypertension in her father; Mental retardation in her maternal aunt; Psoriasis in her brother; Seizures in her cousin and maternal aunt; Suicidality in her cousin; Uterine cancer in her maternal aunt. There is no history of Colon cancer. Social History:  reports that she quit smoking about 6 years ago. Her smoking use included Cigarettes. She smoked 0.50 packs per day. She has never used smokeless tobacco. She reports that she does not drink alcohol or use illicit drugs.   Prenatal Transfer Tool  Maternal Diabetes: No Genetic Screening: Normal Maternal Ultrasounds/Referrals: Normal Fetal Ultrasounds or other Referrals:  None Maternal  Substance Abuse:  No Significant Maternal Medications:  None Significant Maternal Lab Results:  None Other Comments:  None  Review of Systems  All other systems reviewed and are negative.   Dilation: 4.5 Effacement (%): 80 Station: -3 Exam by:: Xoe Hoe Blood pressure 131/91, pulse 96, temperature 98.4 F (36.9 C), temperature source Oral, resp. rate 20, height 5' (1.524 m), weight 187 lb (84.823 kg), last menstrual period 11/03/2013, SpO2 100 %, currently breastfeeding. Maternal Exam:  Uterine Assessment: Contraction strength is moderate.  Contraction frequency is regular.   Abdomen: Fetal presentation: vertex     Fetal Exam Fetal State Assessment: Category I - tracings are normal.     Physical Exam  Nursing note and vitals reviewed. Constitutional: She appears well-developed.  HENT:  Head: Normocephalic.  Eyes: Pupils are equal, round, and reactive to light.  Neck: Normal range of motion.  Cardiovascular: Normal rate and regular rhythm.   Respiratory: Effort normal.  GI: Soft.    Prenatal labs: ABO, Rh: --/--/O POS (03/12 11-19-1981) Antibody: PENDING (03/12 0902) Rubella:   RPR: Non Reactive (03/10 1405)  HBsAg:    HIV: Non-reactive (09/14 0000)  GBS: Negative (03/10 0000)   Assessment/Plan: IUP at 34 w 6 days PTL  Anticipate nsvd   Jeweline Reif L 07/05/2014, 10:13 AM

## 2014-07-05 NOTE — Anesthesia Procedure Notes (Signed)
Epidural Patient location during procedure: OB Start time: 07/05/2014 10:20 AM  Staffing Anesthesiologist: Mal Amabile Performed by: anesthesiologist   Preanesthetic Checklist Completed: patient identified, site marked, surgical consent, pre-op evaluation, timeout performed, IV checked, risks and benefits discussed and monitors and equipment checked  Epidural Patient position: sitting Prep: site prepped and draped and DuraPrep Patient monitoring: continuous pulse ox and blood pressure Approach: midline Location: L3-L4 Injection technique: LOR air  Needle:  Needle type: Tuohy  Needle gauge: 17 G Needle length: 9 cm and 9 Needle insertion depth: 5 cm cm Catheter type: closed end flexible Catheter size: 19 Gauge Catheter at skin depth: 10 cm Test dose: negative and Other  Assessment Events: blood not aspirated, injection not painful, no injection resistance, negative IV test and no paresthesia  Additional Notes Patient identified. Risks and benefits discussed including failed block, incomplete  Pain control, post dural puncture headache, nerve damage, paralysis, blood pressure Changes, nausea, vomiting, reactions to medications-both toxic and allergic and post Partum back pain. All questions were answered. Patient expressed understanding and wished to proceed. Sterile technique was used throughout procedure. Epidural site was Dressed with sterile barrier dressing. No paresthesias, signs of intravascular injection Or signs of intrathecal spread were encountered.  Patient was more comfortable after the epidural was dosed. Please see RN's note for documentation of vital signs and FHR which are stable.

## 2014-07-05 NOTE — Progress Notes (Signed)
Spoke with Dr. Vincente Poli about patient's cervical exam and uterine activity. Patient has not made any change and contractions are irregular and mild. Dr. Vincente Poli states patient can be discharged now, or stay through the night for further evaluation. If there is no change the patient can be discharged in the morning. I went over the options with the patient and they decided to stay through the night for evaluation.

## 2014-07-06 LAB — CBC
HCT: 31.7 % — ABNORMAL LOW (ref 36.0–46.0)
Hemoglobin: 10.4 g/dL — ABNORMAL LOW (ref 12.0–15.0)
MCH: 30.3 pg (ref 26.0–34.0)
MCHC: 32.8 g/dL (ref 30.0–36.0)
MCV: 92.4 fL (ref 78.0–100.0)
PLATELETS: 146 10*3/uL — AB (ref 150–400)
RBC: 3.43 MIL/uL — ABNORMAL LOW (ref 3.87–5.11)
RDW: 13.7 % (ref 11.5–15.5)
WBC: 9.4 10*3/uL (ref 4.0–10.5)

## 2014-07-06 LAB — RPR: RPR Ser Ql: NONREACTIVE

## 2014-07-06 NOTE — Progress Notes (Signed)
S:  Patient is doing well  O:  Afebrile BP 111/70 mmHg  Pulse 80  Temp(Src) 98.6 F (37 C) (Oral)  Resp 16  Ht 5' (1.524 m)  Wt 187 lb (84.823 kg)  BMI 36.52 kg/m2  SpO2 98%  LMP 11/03/2013  Breastfeeding? Unknown Results for orders placed or performed during the hospital encounter of 07/04/14 (from the past 24 hour(s))  CBC     Status: Abnormal   Collection Time: 07/05/14  9:02 AM  Result Value Ref Range   WBC 9.3 4.0 - 10.5 K/uL   RBC 3.46 (L) 3.87 - 5.11 MIL/uL   Hemoglobin 10.8 (L) 12.0 - 15.0 g/dL   HCT 25.3 (L) 66.4 - 40.3 %   MCV 92.5 78.0 - 100.0 fL   MCH 31.2 26.0 - 34.0 pg   MCHC 33.8 30.0 - 36.0 g/dL   RDW 47.4 25.9 - 56.3 %   Platelets 147 (L) 150 - 400 K/uL  RPR     Status: None   Collection Time: 07/05/14  9:02 AM  Result Value Ref Range   RPR Ser Ql Non Reactive Non Reactive  Type and screen     Status: None   Collection Time: 07/05/14  9:02 AM  Result Value Ref Range   ABO/RH(D) O POS    Antibody Screen NEG    Sample Expiration 07/08/2014   CBC     Status: Abnormal   Collection Time: 07/06/14  5:45 AM  Result Value Ref Range   WBC 9.4 4.0 - 10.5 K/uL   RBC 3.43 (L) 3.87 - 5.11 MIL/uL   Hemoglobin 10.4 (L) 12.0 - 15.0 g/dL   HCT 87.5 (L) 64.3 - 32.9 %   MCV 92.4 78.0 - 100.0 fL   MCH 30.3 26.0 - 34.0 pg   MCHC 32.8 30.0 - 36.0 g/dL   RDW 51.8 84.1 - 66.0 %   Platelets 146 (L) 150 - 400 K/uL   Abdomen is soft and non tender Uterus is firm  IMPRESSION: PPD #1 Doing well  PLAN: Routine care Baby in NICU

## 2014-07-06 NOTE — Lactation Note (Signed)
This note was copied from the chart of [name]. Lactation Consultation Note  Patient Name: [name] [id number] Date: 07/06/2014 Reason for consult: Initial assessment;NICU baby  Visited with Mom in her room on [facility].  Baby at 31 hrs old, in the NICU for being [redacted]w[redacted]d gestation.  Mom helped to start double pumping within 6 hours of birth, and has taken 2 vials of colostrum to NICU for her daughter.  Talked about benefits of breast massage, and manual breast expression to express milk for her baby.  NICU brochure and LC brochure left with Mom.  Lots of questions answered about establishing her milk supply, and hopefully getting assistance with latching in the NICU.  Currently baby is on 2 liters of O2, so she understands that latching her will have to wait until baby's respiratory status stabilizes.  Her first child (who is 26 months old), was born at 7 weeks, didn't have to go to the NICU.  Mom pumped for 5 weeks until baby was able to exclusively breast feed her son.  Mom to ask for help prn.  To follow up tomorrow.  Consult Status Consult Status: Follow-up Date: 07/07/14 Follow-up type: In-patient    Judee Clara 07/06/2014, 1:41 PM

## 2014-07-06 NOTE — Anesthesia Postprocedure Evaluation (Signed)
  Anesthesia Post-op Note  Patient: [name]  Procedure(s) Performed: * No procedures listed *  Patient Location: Women's Unit  Anesthesia Type:Epidural  Level of Consciousness: awake and alert   Airway and Oxygen Therapy: Patient Spontanous Breathing  Post-op Pain: mild  Post-op Assessment: Post-op Vital signs reviewed, Patient's Cardiovascular Status Stable, Respiratory Function Stable, No signs of Nausea or vomiting, Pain level controlled, No headache, No residual numbness and No residual motor weakness  Post-op Vital Signs: Reviewed  Last Vitals:  Filed Vitals:   07/06/14 0555  BP: 111/70  Pulse: 80  Temp: 37 C  Resp: 16    Complications: No apparent anesthesia complications

## 2014-07-07 ENCOUNTER — Ambulatory Visit: Payer: Self-pay

## 2014-07-07 MED ORDER — IBUPROFEN 600 MG PO TABS: 600.0000 mg | ORAL_TABLET | Freq: Four times a day (QID) | ORAL | Status: DC

## 2014-07-07 MED ORDER — BENZONATATE 200 MG PO CAPS: 200.0000 mg | ORAL_CAPSULE | Freq: Three times a day (TID) | ORAL | Status: DC

## 2014-07-07 MED ORDER — OXYCODONE-ACETAMINOPHEN 5-325 MG PO TABS: 1.0000 | ORAL_TABLET | ORAL | Status: DC | PRN

## 2014-07-07 MED ORDER — BENZONATATE 100 MG PO CAPS
200.0000 mg | ORAL_CAPSULE | Freq: Three times a day (TID) | ORAL | Status: DC
  Administered 2014-07-07: 200 mg via ORAL
  Filled 2014-07-07 (×4): qty 2

## 2014-07-07 NOTE — Lactation Note (Signed)
This note was copied from the chart of [name]. Lactation Consultation Note  Patient Name: [name] [id number] Date: 07/07/2014 Reason for consult: Follow-up assessment    With this mom and baby, now 51 hours old, and 35 1/7 weeks CGA. I assisted mom with latching baby for the first time. Arielle was laeeepy, eyes closed. Mom has easily expressed milk, but even with baby tasting milk, she would not open her mouth. I was able to latch her , and with stimulation behind her ear, she began suckling, on and off. arielle was fed via ng tube during this feeding. Mom was pleased with this first attempt. basic breast feeding teaching begun. Mom is an experienced breast feeder - she breast fed her almost 29 year old for 15 months.    Maternal Data    Feeding Feeding Type: Breast Fed Nipple Type: Slow - flow Length of feed: 20 min  LATCH Score/Interventions Latch: Repeated attempts needed to sustain latch, nipple held in mouth throughout feeding, stimulation needed to elicit sucking reflex. Intervention(s): Adjust position;Assist with latch;Breast massage;Breast compression  Audible Swallowing: None  Type of Nipple: Everted at rest and after stimulation  Comfort (Breast/Nipple): Soft / non-tender     Hold (Positioning): Assistance needed to correctly position infant at breast and maintain latch. Intervention(s): Breastfeeding basics reviewed;Support Pillows;Position options;Skin to skin  LATCH Score: 6  Lactation Tools Discussed/Used     Consult Status Consult Status: PRN Follow-up type: In-patient (NICU)    Alfred Levins 07/07/2014, 3:24 PM

## 2014-07-07 NOTE — Progress Notes (Signed)
Pt d/c to main entrance accompanied by her husband and NT. Pts husband will be driving her home. No questions at this time. Pt chose to walk rather than use wheelchair. [name]

## 2014-07-07 NOTE — Lactation Note (Signed)
This note was copied from the chart of Christine Amiee Wiley. Lactation Consultation Note    Mom and baby now 41 hours pot partum. Mom has been pumping up to 10 mls of colostrum. She has not been pumping every 3 hours, due to visiting the baby in NICU. I reviewed with mom the importance to pumping every 2-3 hours, followed by hand expression. I reviewed hand expression with mom, and she demonstrated good techniques. Mom had an abundant milk supply with her first baby. I reviewed breast /engorgemetn care. Mom knows that once this baby is doing well respiratory wise, she can ask for help from lactation for latching. Mom and dad very receptive to teaching, and knows to call for questions/concerns.   Patient Name: [name] [id number] Date: 07/07/2014 Reason for consult: Follow-up assessment   Maternal Data    Feeding Feeding Type: Formula Length of feed: 15 min  LATCH Score/Interventions                      Lactation Tools Discussed/Used     Consult Status Consult Status: PRN Follow-up type: In-patient (NICU)    Alfred Levins 07/07/2014, 12:04 PM

## 2014-07-07 NOTE — Progress Notes (Signed)
Pt verbalizes understanding of d/c instructions, medications, follow up appts, when to seek medical attention and belongings policy. All questions answered. No IV at this time. Pt eating breakfast, not yet ready for d/c. Sheryn Bison

## 2014-07-07 NOTE — Discharge Summary (Signed)
Obstetric Discharge Summary Reason for Admission: onset of labor Prenatal Procedures: ultrasound Intrapartum Procedures: vacuum Postpartum Procedures: none Complications-Operative and Postpartum: 1 degree perineal laceration HEMOGLOBIN  Date Value Ref Range Status  07/06/2014 10.4* 12.0 - 15.0 [name]/dL Final   HCT  Date Value Ref Range Status  07/06/2014 31.7* 36.0 - 46.0 % Final    Physical Exam:  General: alert and cooperative Lochia: appropriate Uterine Fundus: firm Incision: healing well DVT Evaluation: No evidence of DVT seen on physical exam. Negative Homan's sign. No cords or calf tenderness. No significant calf/ankle edema.  Discharge Diagnoses: s/p vag delivery @34 .6 weeks  Discharge Information: Date: 07/07/2014 Activity: pelvic rest Diet: routine Medications: PNV, Ibuprofen, Percocet and Zoloft and Wellbutrin Condition: stable Instructions: refer to practice specific booklet Discharge to: home   Newborn Data: Live born female  Birth Weight:   APGAR: 8, 9  Home with NICU.  [name],[name] 07/07/2014, 7:29 AM

## 2014-07-09 ENCOUNTER — Ambulatory Visit: Payer: Self-pay

## 2014-07-09 NOTE — Lactation Note (Signed)
This note was copied from the chart of Girl Cythnia Osmun. Lactation Consultation Note  Patient Name: Girl Hedy Garro [id number] Date: 07/09/2014 Reason for consult: Follow-up assessment;NICU baby NICU baby 93 hours of life, [redacted]w[redacted]d CGA. LC called to NICU to assist with latching baby to breast. Mom's breast milk flowing readily with hand expression. Assisted mom to latch baby to right breast in cross-cradle position. Baby attempted to latch as soon as baby close to nipple. Mom compressed breast and baby latched deeply, suckling rhythmically in several bursts. Baby sleepy at breast and needed to be re-latched several times. After about 10 minutes of attempting, mom allowed baby to sleep nuzzled to breast while being fed by tube/gavage. Discussed with mom that LC available for another attempt tomorrow, and mom can be fitted with NS prior to attempting at that latch. Mom states that she used a NS with first baby and was able to quickly transition away from shield. Mom may put baby directly to breast again tonight. Mom pleased with baby's progress.  Maternal Data    Feeding Feeding Type: Breast Fed Nipple Type: Slow - flow (yellow nipple) Length of feed: 30 min  LATCH Score/Interventions Latch: Repeated attempts needed to sustain latch, nipple held in mouth throughout feeding, stimulation needed to elicit sucking reflex. Intervention(s): Skin to skin;Waking techniques Intervention(s): Breast compression  Audible Swallowing: None  Type of Nipple: Everted at rest and after stimulation  Comfort (Breast/Nipple): Soft / non-tender     Hold (Positioning): Assistance needed to correctly position infant at breast and maintain latch. Intervention(s): Support Pillows;Skin to skin  LATCH Score: 6  Lactation Tools Discussed/Used     Consult Status Consult Status: PRN    Geralynn Ochs 07/09/2014, 3:28 PM

## 2014-07-10 ENCOUNTER — Ambulatory Visit: Payer: Self-pay

## 2014-07-10 NOTE — Lactation Note (Signed)
This note was copied from the chart of [name]. Lactation Consultation Note  Patient Name: Christine Lousie Calico [id number] Date: 07/10/2014   NICU baby 5 days of life. Mom had asked for LC to assist with latching at this feeding and to possibly fit for NS. Assisted mom to latch baby to left breast in cross-cradle position. Baby alert, and mom dripping breast milk. Mom able to latch baby deeply with flanged lips. Baby immediately began suckling enthusiastically in bursts, with swallows/gulps noted. Baby able to pace self, resting in between bursts of suckling and swallowing. Baby nursed off and on for 10 minutes, and stayed at breast for remainder of feeding while gavage/tube fed EBM. Baby did not need NS.  Accompanied mom to NICU pumping room. Mom had complaint of tight/knotty areas in breast. Assisted mom to use hand pump in order to soften areas, and mom states increased comfort. Enc mom to call for any further assistance as needed. Mom's has a good supply of breast milk and no other issues with pumping at this time.  Maternal Data    Feeding Feeding Type: Breast Fed (and NG) Nipple Type: Slow - flow (yellow nipple) Length of feed: 30 min  LATCH Score/Interventions Latch: Grasps breast easily, tongue down, lips flanged, rhythmical sucking. Intervention(s): Skin to skin  Audible Swallowing: Spontaneous and intermittent  Type of Nipple: Everted at rest and after stimulation  Comfort (Breast/Nipple): Soft / non-tender     Hold (Positioning): Assistance needed to correctly position infant at breast and maintain latch. Intervention(s): Support Pillows  LATCH Score: 9  Lactation Tools Discussed/Used     Consult Status      Geralynn Ochs 07/10/2014, 3:51 PM

## 2014-07-15 ENCOUNTER — Ambulatory Visit: Payer: Self-pay

## 2014-07-15 NOTE — Lactation Note (Signed)
This note was copied from the chart of Girl Kemani Heidel. Lactation Consultation Note  Assisted mom in NICU with breastfeeding.  Mom has a great milk supply and pumps 90 mls from each breast.  Her first baby was born at 35 weeks and it took several weeks for baby to establish nutritive breastfeeding but she then breastfed son for 15 months.  Discussed late preterm feeding behavior and the process of baby becoming more effective at milk transfer as baby reaches term.  Discussed triple feeding with mom and we expect most late preterm babies to require this at discharge.  Recommended she continue to practice at breast but baby may be inconsistent.  Reassured we can follow up post discharge with a feeding assessment as outpatient.  Mom placed baby at breast using cradle hold.  Baby latches easily but only non-nutritive sucking with cheek dimpling noted.  I recommended a nipple shield for improved sucking and transfer and mom willing.  She used a nipple shield with her son.  20 mm nipple shield placed and baby immediately improved suck.  Baby was active for about 5 minutes with swallows heard and then became tired.  Baby transferred 10 mls.  Reassured mom and plans made to meet her for assist tomorrow.  She states she understands this feeding process and progression to exclusive breastfeeding.  Patient Name: Girl Jasira Robinson [id number] Date: 07/15/2014 Reason for consult: Follow-up assessment;NICU baby;Infant < 6lbs;Late preterm infant   Maternal Data    Feeding Feeding Type: Breast Fed Nipple Type: Slow - flow Length of feed: 10 min  LATCH Score/Interventions Latch: Grasps breast easily, tongue down, lips flanged, rhythmical sucking. (WITH 20 MM NIPPLE SHIELD) Intervention(s): Teach feeding cues;Waking techniques Intervention(s): Adjust position;Assist with latch;Breast massage;Breast compression  Audible Swallowing: Spontaneous and intermittent  Type of Nipple: Everted at rest and after  stimulation  Comfort (Breast/Nipple): Soft / non-tender     Hold (Positioning): Assistance needed to correctly position infant at breast and maintain latch. Intervention(s): Breastfeeding basics reviewed;Support Pillows;Position options  LATCH Score: 9  Lactation Tools Discussed/Used     Consult Status Consult Status: Follow-up Date: 07/16/14 Follow-up type: In-patient    Huston Foley 07/15/2014, 12:25 PM

## 2014-07-16 ENCOUNTER — Ambulatory Visit: Payer: Self-pay

## 2014-07-16 ENCOUNTER — Encounter (HOSPITAL_COMMUNITY): Payer: Self-pay | Admitting: *Deleted

## 2014-07-16 NOTE — Lactation Note (Signed)
This note was copied from the chart of Christine Constantina Laseter. Lactation Consultation Note  Assisted mom in NICU with a feeding.  Baby has been awake for 3 hours so becoming sleepy.  Baby latches easily with 20 mm nipple shield and suck elicited.  Baby actively sucking off and on for 10 minutes.  Baby does best with breast massage and compression during feeding.  Baby fell asleep and nipple shield full of milk.  Baby transferred 6 mls.  Mom disappointed but reassurance given.  We will continue to follow and assist mom and baby with feedings.  Patient Name: [name] [id number] Date: 07/16/2014 Reason for consult: Follow-up assessment   Maternal Data    Feeding Feeding Type: Breast Fed Length of feed: 10 min  LATCH Score/Interventions Latch: Repeated attempts needed to sustain latch, nipple held in mouth throughout feeding, stimulation needed to elicit sucking reflex. Intervention(s): Skin to skin;Teach feeding cues;Waking techniques Intervention(s): Adjust position;Assist with latch;Breast massage;Breast compression  Audible Swallowing: Spontaneous and intermittent  Type of Nipple: Everted at rest and after stimulation  Comfort (Breast/Nipple): Soft / non-tender     Hold (Positioning): No assistance needed to correctly position infant at breast. Intervention(s): Breastfeeding basics reviewed;Support Pillows;Position options;Skin to skin  LATCH Score: 9  Lactation Tools Discussed/Used     Consult Status Consult Status: Follow-up Date: 07/17/14 Follow-up type: In-patient    Huston Foley 07/16/2014, 12:24 PM

## 2014-07-22 ENCOUNTER — Ambulatory Visit (INDEPENDENT_AMBULATORY_CARE_PROVIDER_SITE_OTHER): Payer: 59 | Admitting: Psychology

## 2014-07-22 DIAGNOSIS — F331 Major depressive disorder, recurrent, moderate: Secondary | ICD-10-CM | POA: Diagnosis not present

## 2014-07-22 DIAGNOSIS — F411 Generalized anxiety disorder: Secondary | ICD-10-CM

## 2014-07-22 NOTE — Progress Notes (Signed)
   THERAPIST PROGRESS NOTE  Session Time: 12.30pm-1.12pm  Participation Level: Active  Behavioral Response: Well GroomedAlert, Anxious  Type of Therapy: Individual Therapy  Treatment Goals addressed: Diagnosis: MDD, GAD and goal 1.  Interventions: CBT and Supportive  Summary: [name] is a 29 y.o. female who presents with generally full and bright affect.  Pt reported she went into preterm labor and delivered her baby girl on 07/05/14 and she had to stay in the NICU for breathing and feeding issues for 15 days.  Pt reported that they are adjusting to having her home.  Pt reported that she has been so busy between NICU and home that she hasn't had time to think and process things. Pt reported that her mood has been good overall, she is sleeping well and has had good support and seeking good support for self.  Pt anticipates now that daughter at home that things are going to "hit her" emotionally.  Pt reported that her husband is returning to work this weekend and she has support from her mother and mother-in-law.  Pt aware of that she has trouble asserting her needs w/ her mother in law as doesn't want to seem rude.  Pt is able to talk w/ husband about and husband will likely communicate needs.  Pt reported that she is aware that she needs to avoid unrealistic expectations "that she can do it all" moving forward and keep good communication open with husband.  Pt also reported that she it was helpful attending NICU groups and sharing with friends.  Pt plans to keep engaged w/ other supports- Women's classes and support groups and visiting w/ friends.   Suicidal/Homicidal: Nowithout intent/plan  Therapist Response: Assessed pt current functioning per pt report.  Processed w/pt stressors of birth of child, NICU and transitioning with caregiving for 2.  Explored w/pt what has been helpful for her in the past couple weeks and how to continue being mindful and using good self care moving forward.   Assisted pt in continuing to reframe negative though patterns- expectations.    Plan: Return again in 2-3 weeks. Pt to continue w/ Dr. Michae Kava.  Pt to attend support groups/classes, engage w/ her supports and express feelings w/ husband.   Diagnosis: MDD, GAD    Forde Radon, LPC 07/22/2014

## 2014-07-29 ENCOUNTER — Ambulatory Visit: Payer: Self-pay

## 2014-07-29 ENCOUNTER — Ambulatory Visit (HOSPITAL_COMMUNITY)
Admission: RE | Admit: 2014-07-29 | Discharge: 2014-07-29 | Disposition: A | Discharge status: Home / Self Care | Payer: 59 | Source: Ambulatory Visit | Attending: Obstetrics and Gynecology | Admitting: Obstetrics and Gynecology

## 2014-07-29 ENCOUNTER — Ambulatory Visit (HOSPITAL_COMMUNITY): Admission: RE | Admit: 2014-07-29 | Payer: 59 | Source: Ambulatory Visit

## 2014-07-29 NOTE — Lactation Note (Signed)
Lactation Consult  Mother's reason for visit: Baby post NICU/premature Visit Type: Feeding assessment Appointment Notes:  NICU Follow up Consult:  Initial Lactation Consultant:  Huston Foley  ________________________________________________________________________  Baby's Name: [name] Date of Birth: 07/05/2014 Pediatrician: [facility] Gender: female Gestational Age: [redacted]w[redacted]d (At Birth) Birth Weight: 6 lb 0.3 oz (2730 g) Weight at Discharge: Weight: 6 lb 0.7 oz (2740 g)Date of Discharge: 07/20/2014 Filed Weights   07/18/14 1400 07/19/14 1630 07/20/14 1101  Weight: 6 lb 0.3 oz (2730 g) 5 lb 15.9 oz (2720 g) 6 lb 0.7 oz (2740 g)   Last weight taken from location outside of [facility]: 6-5on 07/28/14 Location:Pediatrician's office Weight today: 6-6.2     ________________________________________________________________________  Mother's Name: [name] Type of delivery:  Vaginal Breastfeeding Experience: Breastfed first baby for one year  ________________________________________________________________________  Breastfeeding History (Post Discharge)  Frequency of breastfeeding:  10 times per day Duration of feeding:  15-20 minutes  Supplementation   Breastmilk:  Volume 60-49ml Frequency:  2-3 times per day   Method:  Bottle,   Pumping  Type of pump:  Medela pump in style Frequency:  2-3 times per day Volume:  7-8 ounces  Infant Intake and Output Assessment  Voids:  12 in 24 hrs.  Color:  Clear yellow Stools:  10 in 24 hrs.  Color:  Yellow  ________________________________________________________________________  Maternal Breast Assessment  Breast:  Full Nipple:  Erect Pain level:  0 Pain interventions:  Bra  _______________________________________________________________________ Feeding Assessment/Evaluation  Mom and her 62 week old infant here for feeding assessment.  Baby was born at 34.6 weeks and  was in the NICU.  Mom has done a great job maintaining a good milk supply.  She states baby is breastfeeding well with some feeds but very sleepy with others.  Observed mom latch baby easily.  Mom shown how to use good breast massage and compression to increase milk flow and intake.  Baby nursed actively for 15 minutes and transferred 88 mls.  Mom very pleased.  Mom will continue as she has been and plans to follow weights with [facility] [employer] or support group.  Encouraged to call with any questions/concerns.  Initial feeding assessment:  Infant's oral assessment:  WNL  Positioning:  Cross cradle Right breast  LATCH documentation:  Latch:  2 = Grasps breast easily, tongue down, lips flanged, rhythmical sucking.  Audible swallowing:  2 = Spontaneous and intermittent  Type of nipple:  2 = Everted at rest and after stimulation  Comfort (Breast/Nipple):  2 = Soft / non-tender  Hold (Positioning):  2 = No assistance needed to correctly position infant at breast  LATCH score:  10  Attached assessment:  Deep  Lips flanged:  No.  Lips untucked:  Yes.    Suck assessment:  Nutritive   Pre-feed weight:  2896 g Post-feed weight:  2984 g Amount transferred:  88 ml Amount supplemented:  0 ml      Total amount transferred:  88 ml Total supplement given: 0 ml

## 2014-07-29 NOTE — Lactation Note (Incomplete)
This note was copied from the chart of Geralyn Flash. Lactation Consultation Note  Patient Name: [name] [id number] Date: 07/29/2014   Baby's Name: Geralyn Flash Date of Birth: 07/05/2014 Pediatrician: *** Gender: female Gestational Age: [redacted]w[redacted]d (At Birth) Birth Weight: 6 lb 0.3 oz (2730 g) Weight at Discharge: Weight: 6 lb 0.7 oz (2740 g)Date of Discharge: 07/20/2014 [facility] Weights   07/18/14 1400 07/19/14 1630 07/20/14 1101  Weight: 6 lb 0.3 oz (2730 g) 5 lb 15.9 oz (2720 g) 6 lb 0.7 oz (2740 g)   Last weight taken from location outside of [facility]: *** Location:{Outpt. wt. location outside of [facility]:[id number]} Weight today: ***     Maternal Data    Feeding    LATCH Score/Interventions                      Lactation Tools Discussed/Used     Consult Status      Huston Foley 07/29/2014, 2:20 PM

## 2014-08-19 ENCOUNTER — Ambulatory Visit (INDEPENDENT_AMBULATORY_CARE_PROVIDER_SITE_OTHER): Payer: 59 | Admitting: Psychology

## 2014-08-19 DIAGNOSIS — F331 Major depressive disorder, recurrent, moderate: Secondary | ICD-10-CM | POA: Diagnosis not present

## 2014-08-19 NOTE — Progress Notes (Signed)
   THERAPIST PROGRESS NOTE  Session Time: 12.30pm-1.22pm  Participation Level: Active  Behavioral Response: Well GroomedAlertDepressed and , Tearful  Type of Therapy: Individual Therapy  Treatment Goals addressed: Diagnosis: MDD and goal 1.  Interventions: CBT and Other: grounding teachniques, mindfulness and self care.  Summary: [name] is a 29 y.o. female who presents with w/ depressed mood and affect.  Pt is tearful at begin of session and emotional dysregulation when expresses "im not doing well'.  Pt reported that she is overwhelmed w/ newborn, caring for son and all the caretaking and household duties while her husband is completing his semester and working full time.  Pt reported that her husband and her are not getting along and argued yesterday.  Pt reported their communication hasn't been effective- husband home and sleeps and doesn't feel he grasps what she is experiencing.  Pt was able to identify supports she has had and acknowledges she has shut herself in the home again.  Pt reported she did get out w/ kids and friend over the weekend and this was very helpful.  Pt also reported that she will have the opportunity to go to gym today and this is usually very beneficial and looking forward to this.  Pt was able to identify other ways she would feel comfortable accessing supports and other plans for engaging outside of the home.  Pt was able to reframe cognitive distortions about she should be getting done.  Pt expressed feeling that session was helpful to getting more clarity and enjoyed the self massage and feels better now.  Pt appeared more calm as well.    Suicidal/Homicidal: Nowithout intent/plan  Therapist Response: Assessed pt current functioning per her report.  Reiterated pt safe space to explore and grounding techniques to assist emotional regulation.  Explored w/pt communication w/ husband and effects.  Discussed ways for more effective communication to express her  emotions and needs.  Assisted pt w/ identify other ways to access her support system and how to engage w/ activities outside the home.  Reiterated daily self care to have energy to give back to others.  Assisted pt in making healthy reframes of self talk.  Introduced and led pt through self massage as way of self care and processed w/her afterward.    Plan: Return again in 2 weeks.  Diagnosis: MDD     Forde Radon, [facility] 08/19/2014

## 2014-08-20 NOTE — Discharge Summary (Signed)
Admission Diagnosis: IUP at 34 weeks Preterm contractions  Discharge Diagnosis: Same  Hospital Course: 29 year old female with preterm contractions. She has a history of pre term labor. She was admitted for observation and after a period of time and treatment with procardia her contractions spaced. She did not have any cervical change.  She was discharged home with PTL precautions.

## 2014-08-26 ENCOUNTER — Encounter: Payer: Self-pay | Admitting: Family Medicine

## 2014-08-26 ENCOUNTER — Ambulatory Visit (INDEPENDENT_AMBULATORY_CARE_PROVIDER_SITE_OTHER): Payer: 59 | Admitting: Family Medicine

## 2014-08-26 VITALS — BP 109/78 | HR 90 | Temp 99.2°F

## 2014-08-26 DIAGNOSIS — J069 Acute upper respiratory infection, unspecified: Secondary | ICD-10-CM | POA: Diagnosis not present

## 2014-08-26 NOTE — Progress Notes (Signed)
Pre visit review using our clinic review tool, if applicable. No additional management support is needed unless otherwise documented below in the visit note. Unable to weight

## 2014-08-26 NOTE — Progress Notes (Signed)
   Subjective:    Patient ID: [name], female    DOB: 1985/10/14, 29 y.o.   MRN: [id number]  HPI Here for 5 days of low garde fevers, stuffy head, ST, and a dry cough. Using Ibuprofen.    Review of Systems  Constitutional: Positive for fever.  HENT: Positive for congestion, postnasal drip and sore throat.   Eyes: Negative.   Respiratory: Positive for cough.        Objective:   Physical Exam  Constitutional: She appears well-developed and well-nourished.  HENT:  Right Ear: External ear normal.  Left Ear: External ear normal.  Nose: Nose normal.  Mouth/Throat: Oropharynx is clear and moist.  Eyes: Conjunctivae are normal.  Pulmonary/Chest: Effort normal and breath sounds normal.  Lymphadenopathy:    She has no cervical adenopathy.          Assessment & Plan:  Viral URI. Add Robitussin prn

## 2014-09-03 ENCOUNTER — Telehealth: Payer: Self-pay | Admitting: Family Medicine

## 2014-09-03 MED ORDER — AMOXICILLIN-POT CLAVULANATE 875-125 MG PO TABS: 1.0000 | ORAL_TABLET | Freq: Two times a day (BID) | ORAL | Status: DC

## 2014-09-03 NOTE — Telephone Encounter (Signed)
Call in Augmentin [phone] bid for 10 days  

## 2014-09-03 NOTE — Telephone Encounter (Signed)
I sent script e-scribe and spoke with pt. 

## 2014-09-03 NOTE — Telephone Encounter (Signed)
Pt seen 5/3 for sore throat.  Now pt has white patches in the back of her throat, swelling in lymph nodes.  Pt would like rx.  outpt pharm

## 2014-09-11 ENCOUNTER — Ambulatory Visit (INDEPENDENT_AMBULATORY_CARE_PROVIDER_SITE_OTHER): Payer: 59 | Admitting: Psychiatry

## 2014-09-11 ENCOUNTER — Encounter (HOSPITAL_COMMUNITY): Payer: Self-pay | Admitting: Psychiatry

## 2014-09-11 VITALS — BP 100/62 | HR 69 | Ht 61.0 in | Wt 161.2 lb

## 2014-09-11 DIAGNOSIS — F331 Major depressive disorder, recurrent, moderate: Secondary | ICD-10-CM | POA: Diagnosis not present

## 2014-09-11 DIAGNOSIS — F411 Generalized anxiety disorder: Secondary | ICD-10-CM | POA: Diagnosis not present

## 2014-09-11 MED ORDER — BUPROPION HCL ER (XL) 300 MG PO TB24: 300.0000 mg | ORAL_TABLET | Freq: Every day | ORAL | Status: DC

## 2014-09-11 MED ORDER — SERTRALINE HCL 100 MG PO TABS: 150.0000 mg | ORAL_TABLET | Freq: Every day | ORAL | Status: DC

## 2014-09-11 NOTE — Psych (Signed)
Patient ID: [name], female DOB: [birth year], 29 y.o. MRN: [id number]  [facility] [id number] Progress Note  NYX KEADY [id number] 29 y.o.  09/11/2014 01:13 PM  Chief Complaint: "I am stable"  History of Present Illness: Pt delivered in March and baby and mom are doing well. She plans to start work again next week.  Her daughter will be cared for by her husband.  Depression is mild. Pt is isolating and thinks going to work will be good for her. Husband is off for the summer and is attentive. She has some anhedonia. She is going to be a mommy group once a week. Denies hopelessness, worthlessness and crying spells.   Sleeping good as her daughter only wakes up once during the night. Energy is so/so. Appetite is good. Concentration is normal.   Anxiety is manageable but a little higher due to returning to work. Pt continues to pull her eyelashes and has decreased to 1-2x/week. She will catch herself and stop.    States Wellbutrin and Zoloft have helped a lot. She is taking them as prescribed and denies SE.   Suicidal Ideation: No Plan Formed: No Patient has means to carry out plan: No  Homicidal Ideation: No Plan Formed: No Patient has means to carry out plan: No  Review of Systems: Psychiatric: Agitation: No Hallucination: No Depressed Mood: No Insomnia: No Hypersomnia: No Altered Concentration: No Feels Worthless: No Grandiose Ideas: No Belief In Special Powers: No New/Increased Substance Abuse: No Compulsions: No  Neurologic: Headache: No Seizure: No Paresthesias: No   Review of Systems  Constitutional: Negative for fever, chills and weight loss.  HENT: Positive for sore throat. Negative for congestion, ear pain and nosebleeds.   Eyes: Negative for blurred vision, double vision and pain.  Respiratory: Negative for cough, sputum production and wheezing.   Cardiovascular: Positive for palpitations. Negative for chest pain and leg swelling.   Gastrointestinal: Negative for heartburn, nausea, vomiting and abdominal pain.  Musculoskeletal: Negative for back pain, joint pain and neck pain.  Skin: Negative for itching and rash.  Neurological: Negative for dizziness, tremors, sensory change, seizures, loss of consciousness and headaches.  Psychiatric/Behavioral: Positive for depression. Negative for suicidal ideas, hallucinations and substance abuse. The patient is nervous/anxious. The patient does not have insomnia.       Past Medical Family, Social History: Pt is married and has one child. She works as a Engineer, civil (consulting) at [employer]. Pt drinks wine 3x/week and is a former smoker. She denies drug use. Pt reports family hx of depression and anxiety in her father. Also reports family of depression in her cousin who attempted suicide.   Past Medical History  Diagnosis Date  . Depression     sees Jorje Guild PA at [facility]  . GERD (gastroesophageal reflux disease)   . HPV (human papilloma virus) infection   . Anxiety   . Abnormal Pap smear of cervix   . Gynecological examination     sees Dr. Henderson Cloud  . Tracheomalacia, congenital   . Brachydactyly of fingers   . Rheumatoid arthritis(714.0)     sees Dr. Azzie Roup   . Pregnancy     due August 10, 2014    Current Outpatient Prescriptions on File Prior to Visit  Medication Sig Dispense Refill  . amoxicillin-clavulanate (AUGMENTIN) 875-125 MG per tablet Take 1 tablet by mouth 2 (two) times daily. 20 tablet 0  . buPROPion (WELLBUTRIN XL) 300 MG 24 hr tablet Take 1 tablet (300 mg total)  by mouth daily. 30 tablet 2  . Prenatal Vit-Fe Fumarate-FA (PRENATAL MULTIVITAMIN) TABS Take 1 tablet by mouth at bedtime.     . sertraline (ZOLOFT) 100 MG tablet Take 1.5 tablets (150 mg total) by mouth daily. 45 tablet 2  . benzonatate (TESSALON) 200 MG capsule Take 1 capsule (200 mg total) by mouth 3 (three) times daily. (Patient not taking:  Reported on 09/11/2014) 20 capsule 0  . ibuprofen (ADVIL,MOTRIN) 600 MG tablet Take 1 tablet (600 mg total) by mouth every 6 (six) hours. (Patient not taking: Reported on 09/11/2014) 30 tablet 1  . oxyCODONE-acetaminophen (PERCOCET/ROXICET) 5-325 MG per tablet Take 1 tablet by mouth every 4 (four) hours as needed (for pain scale less than 7). (Patient not taking: Reported on 08/26/2014) 30 tablet 0   No current facility-administered medications on file prior to visit.                                            Past Psychiatric History/Hospitalization(s): Anxiety: Yes Bipolar Disorder: No Depression: Yes Mania: No Psychosis: No Schizophrenia: No Personality Disorder: No Hospitalization for psychiatric illness: No History of Electroconvulsive Shock Therapy: No Prior Suicide Attempts: No  Physical Exam: Constitutional: BP 123/73 mmHg  Pulse 96  Ht 5' 0.75" (1.543 m)  Wt 179 lb 3.2 oz (81.285 kg)  BMI 34.14 kg/m2  LMP 11/03/2013  General Appearance: alert, oriented, no acute distress and well nourished  Musculoskeletal: Strength & Muscle Tone: within normal limits Gait & Station: normal Patient leans: N/A  Mental Status Examination/Evaluation: Objective: Attitude: Calm and cooperative  Appearance: Casual, appears to be stated age  Eye Contact:: Good  Speech: Clear and Coherent and Normal Rate  Volume: Normal  Mood: euthymic  Affect: Full Range   Thought Process: Intact, Linear and Logical  Orientation: Full (Time, Place, and Person)  Thought Content: Negative  Suicidal Thoughts: No  Homicidal Thoughts: No  Judgement: Fair  Insight: Fair  Concentration: good  Memory: Immediate-fair Recent-fair Remote-fair  Recall: fair  Language: fair  Gait and Station: normal  [employer] of Knowledge: average  Psychomotor Activity: Normal  Akathisia: No  Handed: Right  AIMS (if indicated): n/a        Medical Decision  Making (Choose Three): Established Problem, Stable/Improving (1), Review of Psycho-Social Stressors (1), Review of Last Therapy Session (1) and Review of Medication Regimen & Side Effects (2)   Assessment: Axis I: GAD, MDD- recurrent, moderate Axis II: deferred  Axis III:  Past Medical History  Diagnosis Date  . Depression     sees Jorje Guild PA at [facility]  . GERD (gastroesophageal reflux disease)   . HPV (human papilloma virus) infection   . Anxiety   . Abnormal Pap smear of cervix   . Gynecological examination     sees Dr. Henderson Cloud  . Tracheomalacia, congenital   . Brachydactyly of fingers   . Rheumatoid arthritis(714.0)     sees Dr. Azzie Roup   . Pregnancy     due August 10, 2014    Axis IV: anxiety, work stress, marital stressrors Axis V: GAF of 75    Plan: Continue Zoloft 150 mg daily for anxiety and depression Continue Wellbutrin XL 300mg  daily for depression -Risks and benefits, side effects and alternatives discussed with patient, pt was given an opportunity to ask questions about medication, illness, and treatment. All current psychiatric medications  have been reviewed and discussed with the patient and adjusted as clinically appropriate. The patient has been provided an accurate and updated list of the medications being now prescribed.   No new labs today  Therapy: brief supportive therapy provided. Discussed psychosocial stressors in detail. Encouraged to continue individual therapy with LeAnn.   Pt denies SI and is at an acute low risk for suicide.Patient told to call clinic if any problems occur. Patient advised to go to ER if they should develop SI/HI, side effects, or if symptoms worsen. Has crisis numbers to call if needed. Pt verbalized understanding.  F/up in 6 months or sooner if needed  Oletta Darter, MD 09/11/2014

## 2014-09-19 ENCOUNTER — Ambulatory Visit (HOSPITAL_COMMUNITY): Payer: Self-pay | Admitting: Psychology

## 2014-09-30 ENCOUNTER — Ambulatory Visit (INDEPENDENT_AMBULATORY_CARE_PROVIDER_SITE_OTHER): Payer: 59 | Admitting: Family Medicine

## 2014-09-30 ENCOUNTER — Encounter: Payer: Self-pay | Admitting: Family Medicine

## 2014-09-30 VITALS — BP 108/62 | HR 124 | Temp 98.4°F | Wt 162.0 lb

## 2014-09-30 DIAGNOSIS — G5762 Lesion of plantar nerve, left lower limb: Secondary | ICD-10-CM | POA: Diagnosis not present

## 2014-09-30 NOTE — Progress Notes (Signed)
   Subjective:    Patient ID: [name], female    DOB: Mar 04, 1986, 29 y.o.   MRN: [id number]  HPI Here for one month of intermittent pains in the left foot. No redness or swelling. No hx of trauma.    Review of Systems  Musculoskeletal: Positive for arthralgias.       Objective:   Physical Exam  Constitutional: She appears well-developed and well-nourished.  Musculoskeletal:  Left foot has no erythema or warmth or swelling. She is tender in the dorsal foot between the 4th and 5th metatarsals           Assessment & Plan:  Mortons neuroma. Wear supportive shoes. Try hot soaks with Epsom salts. Use Motrin prn

## 2014-09-30 NOTE — Progress Notes (Signed)
Pre visit review using our clinic review tool, if applicable. No additional management support is needed unless otherwise documented below in the visit note. 

## 2014-10-10 ENCOUNTER — Encounter: Payer: Self-pay | Admitting: Family Medicine

## 2014-10-10 DIAGNOSIS — M25572 Pain in left ankle and joints of left foot: Secondary | ICD-10-CM

## 2014-10-10 NOTE — Telephone Encounter (Signed)
Referral was done  

## 2014-10-16 ENCOUNTER — Ambulatory Visit (HOSPITAL_COMMUNITY): Payer: Self-pay | Admitting: Psychology

## 2014-10-20 ENCOUNTER — Encounter: Payer: Self-pay | Admitting: Family Medicine

## 2014-10-20 ENCOUNTER — Ambulatory Visit (INDEPENDENT_AMBULATORY_CARE_PROVIDER_SITE_OTHER): Payer: 59 | Admitting: Family Medicine

## 2014-10-20 VITALS — BP 124/75 | HR 82 | Temp 99.2°F | Ht 61.0 in | Wt 162.0 lb

## 2014-10-20 DIAGNOSIS — R000 Tachycardia, unspecified: Secondary | ICD-10-CM | POA: Diagnosis not present

## 2014-10-20 DIAGNOSIS — D538 Other specified nutritional anemias: Secondary | ICD-10-CM

## 2014-10-20 NOTE — Progress Notes (Signed)
Pre visit review using our clinic review tool, if applicable. No additional management support is needed unless otherwise documented below in the visit note. 

## 2014-10-21 ENCOUNTER — Encounter: Payer: Self-pay | Admitting: Family Medicine

## 2014-10-21 LAB — CBC WITH DIFFERENTIAL/PLATELET
BASOS PCT: 0.5 % (ref 0.0–3.0)
Basophils Absolute: 0.1 10*3/uL (ref 0.0–0.1)
Eosinophils Absolute: 0.1 10*3/uL (ref 0.0–0.7)
Eosinophils Relative: 1.5 % (ref 0.0–5.0)
HCT: 39.4 % (ref 36.0–46.0)
Hemoglobin: 13.2 g/dL (ref 12.0–15.0)
LYMPHS ABS: 2.8 10*3/uL (ref 0.7–4.0)
Lymphocytes Relative: 27.2 % (ref 12.0–46.0)
MCHC: 33.5 g/dL (ref 30.0–36.0)
MCV: 90 fl (ref 78.0–100.0)
MONO ABS: 0.6 10*3/uL (ref 0.1–1.0)
Monocytes Relative: 5.7 % (ref 3.0–12.0)
NEUTROS PCT: 65.1 % (ref 43.0–77.0)
Neutro Abs: 6.6 10*3/uL (ref 1.4–7.7)
Platelets: 277 10*3/uL (ref 150.0–400.0)
RBC: 4.38 Mil/uL (ref 3.87–5.11)
RDW: 13.8 % (ref 11.5–15.5)
WBC: 10.1 10*3/uL (ref 4.0–10.5)

## 2014-10-21 LAB — HEPATIC FUNCTION PANEL
ALBUMIN: 4.7 g/dL (ref 3.5–5.2)
ALT: 20 U/L (ref 0–35)
AST: 23 U/L (ref 0–37)
Alkaline Phosphatase: 109 U/L (ref 39–117)
BILIRUBIN TOTAL: 0.3 mg/dL (ref 0.2–1.2)
Bilirubin, Direct: 0 mg/dL (ref 0.0–0.3)
TOTAL PROTEIN: 7.7 g/dL (ref 6.0–8.3)

## 2014-10-21 LAB — BASIC METABOLIC PANEL
BUN: 21 mg/dL (ref 6–23)
CO2: 26 mEq/L (ref 19–32)
Calcium: 9.9 mg/dL (ref 8.4–10.5)
Chloride: 104 mEq/L (ref 96–112)
Creatinine, Ser: 0.94 mg/dL (ref 0.40–1.20)
GFR: 75.08 mL/min (ref >60.00)
Glucose, Bld: 93 mg/dL (ref 70–99)
POTASSIUM: 4.1 meq/L (ref 3.5–5.1)
Sodium: 141 mEq/L (ref 135–145)

## 2014-10-21 LAB — TSH: TSH: 1.53 u[IU]/mL (ref 0.35–4.50)

## 2014-10-21 LAB — VITAMIN B12: VITAMIN B 12: 530 pg/mL (ref 211–911)

## 2014-10-21 LAB — FERRITIN: FERRITIN: 31.7 ng/mL (ref 10.0–291.0)

## 2014-10-21 NOTE — Progress Notes (Signed)
   Subjective:    Patient ID: [name], female    DOB: 25-May-1985, 29 y.o.   MRN: [id number]  HPI Here for several weeks of feeling tired and having her hearty race with minimal exertion. No chest pain or SOB. At rest she feels fine but simply walking up a flight of steps will make her heart rate go up to the 120-140 range for a few minutes. otherwsie she feels fine. No recent medication changes.    Review of Systems  Constitutional: Positive for fatigue.  Respiratory: Negative.   Cardiovascular: Positive for palpitations. Negative for chest pain and leg swelling.  Gastrointestinal: Negative.   Endocrine: Negative.   Neurological: Negative.        Objective:   Physical Exam  Constitutional: She is oriented to person, place, and time. She appears well-developed and well-nourished. No distress.  Neck: No thyromegaly present.  Cardiovascular: Normal rate, regular rhythm, normal heart sounds and intact distal pulses.   Pulmonary/Chest: Effort normal and breath sounds normal.  Abdominal: Soft. Bowel sounds are normal. She exhibits no distension and no mass. There is no tenderness. There is no rebound and no guarding.  Lymphadenopathy:    She has no cervical adenopathy.  Neurological: She is alert and oriented to person, place, and time. No cranial nerve deficit.          Assessment & Plan:  Tachycardia of uncertain etiology. Anemia and some other things could explain this, so we will get labs today and go from there.

## 2014-10-23 ENCOUNTER — Ambulatory Visit (INDEPENDENT_AMBULATORY_CARE_PROVIDER_SITE_OTHER): Payer: 59 | Admitting: Podiatry

## 2014-10-23 ENCOUNTER — Ambulatory Visit: Payer: Self-pay

## 2014-10-23 ENCOUNTER — Encounter: Payer: Self-pay | Admitting: Podiatry

## 2014-10-23 VITALS — BP 95/41 | HR 77 | Resp 15

## 2014-10-23 DIAGNOSIS — M779 Enthesopathy, unspecified: Secondary | ICD-10-CM

## 2014-10-23 DIAGNOSIS — M79672 Pain in left foot: Secondary | ICD-10-CM

## 2014-10-23 MED ORDER — DICLOFENAC SODIUM 75 MG PO TBEC: 75.0000 mg | DELAYED_RELEASE_TABLET | Freq: Two times a day (BID) | ORAL | Status: DC

## 2014-10-23 MED ORDER — TRIAMCINOLONE ACETONIDE 10 MG/ML IJ SUSP
10.0000 mg | Freq: Once | INTRAMUSCULAR | Status: AC
  Administered 2014-10-23: 10 mg

## 2014-10-23 NOTE — Progress Notes (Signed)
Subjective:     Patient ID: [name], female   DOB: 09-06-1985, 29 y.o.   MRN: [id number]  HPI patient states I'm getting a lot of pain in the outside of my left foot that's been present for almost a year and seems worse when I was pregnant and still has not gone away even though my child's 106 months old area patient states that it's worse when she's doing a lot of walking   Review of Systems  All other systems reviewed and are negative.      Objective:   Physical Exam  Constitutional: She is oriented to person, place, and time.  Cardiovascular: Intact distal pulses.   Musculoskeletal: Normal range of motion.  Neurological: She is oriented to person, place, and time.  Skin: Skin is warm.  Nursing note and vitals reviewed.  neurovascular status intact muscle strength adequate range of motion within normal limits with quite a bit of discomfort in the lateral side of the left foot proximal to the peroneal insertion into the base of fifth metatarsal and slightly more dorsal. There is no increased edema and it does not appear to be a fracture but it is tender when pressed     Assessment:     Probable dorsal lateral tendinitis with possibility for for swollen joints    Plan:     H&P x-rays reviewed and today sheath injection left performed of the tendon complex 5 mill grams Xylocaine and advised on heat and ice therapy and exercises. Reappoint to recheck

## 2014-10-23 NOTE — Progress Notes (Signed)
   Subjective:    Patient ID: [name], female    DOB: [birth year], 29 y.o.   MRN: [id number]  HPI Pt presents with left foot pain on her lateral side lasting 6 months now. She has tried supportive shoe gear, ice , nsaids, with no relief.   Review of Systems  All other systems reviewed and are negative.      Objective:   Physical Exam        Assessment & Plan:

## 2014-11-13 ENCOUNTER — Ambulatory Visit (HOSPITAL_COMMUNITY): Payer: Self-pay | Admitting: Psychology

## 2014-11-25 ENCOUNTER — Encounter (HOSPITAL_COMMUNITY): Payer: Self-pay | Admitting: Psychology

## 2014-11-25 DIAGNOSIS — F411 Generalized anxiety disorder: Secondary | ICD-10-CM

## 2014-11-25 DIAGNOSIS — F331 Major depressive disorder, recurrent, moderate: Secondary | ICD-10-CM

## 2014-11-25 NOTE — Progress Notes (Signed)
[name] is a 29 y.o. female patient discharged from counseling as seeking marriage counseling w/ her husband.  Outpatient Therapist Discharge Summary  BRANAE CRAIL    [birth year]   Admission Date: 06/20/14   Discharge Date:  11/25/14 Reason for Discharge:  Change in service to marriage counseling Diagnosis:   Major depressive disorder, recurrent episode, moderate  Generalized anxiety disorder    Comments:  Pt cancelled individually counseling appointment on 11/12/14 informing going for marriage counseling now.   Alfredo Batty, LPC

## 2014-12-18 ENCOUNTER — Ambulatory Visit (HOSPITAL_COMMUNITY): Payer: Self-pay | Admitting: Psychology

## 2014-12-25 ENCOUNTER — Encounter: Payer: Self-pay | Admitting: Family Medicine

## 2014-12-25 NOTE — Telephone Encounter (Signed)
Pt will need a office visit. Can you call to get this scheduled?

## 2014-12-26 ENCOUNTER — Ambulatory Visit (INDEPENDENT_AMBULATORY_CARE_PROVIDER_SITE_OTHER): Payer: 59 | Admitting: Family Medicine

## 2014-12-26 ENCOUNTER — Encounter: Payer: Self-pay | Admitting: Family Medicine

## 2014-12-26 VITALS — BP 97/64 | HR 70 | Temp 98.3°F | Ht 61.0 in | Wt 160.0 lb

## 2014-12-26 DIAGNOSIS — J019 Acute sinusitis, unspecified: Secondary | ICD-10-CM | POA: Diagnosis not present

## 2014-12-26 MED ORDER — AMOXICILLIN-POT CLAVULANATE 875-125 MG PO TABS: 1.0000 | ORAL_TABLET | Freq: Two times a day (BID) | ORAL | Status: DC

## 2014-12-26 MED ORDER — METHYLPREDNISOLONE ACETATE 80 MG/ML IJ SUSP
120.0000 mg | Freq: Once | INTRAMUSCULAR | Status: AC
  Administered 2014-12-26: 120 mg via INTRAMUSCULAR

## 2014-12-26 NOTE — Progress Notes (Signed)
   Subjective:    Patient ID: [name], female    DOB: [birth year], 29 y.o.   MRN: [id number]  HPI Here for 10 days of sinus pressure, PND, HA, and a dry cough. No fever. On Ibuprofen.    Review of Systems  Constitutional: Negative.   HENT: Positive for congestion, postnasal drip and sinus pressure.   Eyes: Negative.   Respiratory: Positive for cough.        Objective:   Physical Exam  Constitutional: She appears well-developed and well-nourished.  HENT:  Right Ear: External ear normal.  Left Ear: External ear normal.  Nose: Nose normal.  Mouth/Throat: Oropharynx is clear and moist.  Eyes: Conjunctivae are normal.  Neck: No thyromegaly present.  Cardiovascular: Normal rate, regular rhythm, normal heart sounds and intact distal pulses.   Pulmonary/Chest: Effort normal and breath sounds normal.  Lymphadenopathy:    She has no cervical adenopathy.          Assessment & Plan:  Sinusitis, treat with a steroid shot and Augmentin.

## 2014-12-26 NOTE — Progress Notes (Signed)
Pre visit review using our clinic review tool, if applicable. No additional management support is needed unless otherwise documented below in the visit note. 

## 2014-12-26 NOTE — Addendum Note (Signed)
Addended by: Aniceto Boss A on: 12/26/2014 12:06 PM   Modules accepted: Orders

## 2015-01-11 ENCOUNTER — Encounter: Payer: Self-pay | Admitting: Emergency Medicine

## 2015-01-11 ENCOUNTER — Emergency Department (INDEPENDENT_AMBULATORY_CARE_PROVIDER_SITE_OTHER)
Admission: EM | Admit: 2015-01-11 | Discharge: 2015-01-11 | Disposition: A | Discharge status: Home / Self Care | Payer: 59 | Source: Home / Self Care | Attending: Family Medicine | Admitting: Family Medicine

## 2015-01-11 DIAGNOSIS — H109 Unspecified conjunctivitis: Secondary | ICD-10-CM

## 2015-01-11 DIAGNOSIS — H01003 Unspecified blepharitis right eye, unspecified eyelid: Secondary | ICD-10-CM | POA: Diagnosis not present

## 2015-01-11 MED ORDER — POLYMYXIN B-TRIMETHOPRIM 10000-0.1 UNIT/ML-% OP SOLN: 1.0000 [drp] | Freq: Four times a day (QID) | OPHTHALMIC | Status: DC

## 2015-01-11 MED ORDER — CARBOXYMETHYLCELLULOSE SODIUM 1 % OP SOLN
1.0000 [drp] | Freq: Three times a day (TID) | OPHTHALMIC | Status: DC
Start: 2015-01-11 — End: 2015-02-16

## 2015-01-11 NOTE — ED Notes (Signed)
Patient presents to [facility] with C/O right eye watery, itchy and slight pain history of the same she advised this problem has reoccurred every 3 weeks since May.

## 2015-01-11 NOTE — Discharge Instructions (Signed)
Bacterial Conjunctivitis  Bacterial conjunctivitis, commonly called pink eye, is an inflammation of the clear membrane that covers the white part of the eye (conjunctiva). The inflammation can also happen on the underside of the eyelids. The blood vessels in the conjunctiva become inflamed, causing the eye to become red or pink. Bacterial conjunctivitis may spread easily from one eye to another and from person to person (contagious).   CAUSES   Bacterial conjunctivitis is caused by bacteria. The bacteria may come from your own skin, your upper respiratory tract, or from someone else with bacterial conjunctivitis.  SYMPTOMS   The normally white color of the eye or the underside of the eyelid is usually pink or red. The pink eye is usually associated with irritation, tearing, and some sensitivity to light. Bacterial conjunctivitis is often associated with a thick, yellowish discharge from the eye. The discharge may turn into a crust on the eyelids overnight, which causes your eyelids to stick together. If a discharge is present, there may also be some blurred vision in the affected eye.  DIAGNOSIS   Bacterial conjunctivitis is diagnosed by your caregiver through an eye exam and the symptoms that you report. Your caregiver looks for changes in the surface tissues of your eyes, which may point to the specific type of conjunctivitis. A sample of any discharge may be collected on a cotton-tip swab if you have a severe case of conjunctivitis, if your cornea is affected, or if you keep getting repeat infections that do not respond to treatment. The sample will be sent to a lab to see if the inflammation is caused by a bacterial infection and to see if the infection will respond to antibiotic medicines.  TREATMENT   · Bacterial conjunctivitis is treated with antibiotics. Antibiotic eyedrops are most often used. However, antibiotic ointments are also available. Antibiotics pills are sometimes used. Artificial tears or eye  washes may ease discomfort.  HOME CARE INSTRUCTIONS   · To ease discomfort, apply a cool, clean washcloth to your eye for 10-20 minutes, 3-4 times a day.  · Gently wipe away any drainage from your eye with a warm, wet washcloth or a cotton ball.  · Wash your hands often with soap and water. Use paper towels to dry your hands.  · Do not share towels or washcloths. This may spread the infection.  · Change or wash your pillowcase every day.  · You should not use eye makeup until the infection is gone.  · Do not operate machinery or drive if your vision is blurred.  · Stop using contact lenses. Ask your caregiver how to sterilize or replace your contacts before using them again. This depends on the type of contact lenses that you use.  · When applying medicine to the infected eye, do not touch the edge of your eyelid with the eyedrop bottle or ointment tube.  SEEK IMMEDIATE MEDICAL CARE IF:   · Your infection has not improved within 3 days after beginning treatment.  · You had yellow discharge from your eye and it returns.  · You have increased eye pain.  · Your eye redness is spreading.  · Your vision becomes blurred.  · You have a fever or persistent symptoms for more than 2-3 days.  · You have a fever and your symptoms suddenly get worse.  · You have facial pain, redness, or swelling.  MAKE SURE YOU:   · Understand these instructions.  · Will watch your condition.  · Will get help   right away if you are not doing well or get worse.  Document Released: 04/11/2005 Document Revised: 08/26/2013 Document Reviewed: 09/12/2011  ExitCare® Patient Information ©2015 [employer]. This information is not intended to replace advice given to you by your health care provider. Make sure you discuss any questions you have with your health care provider.  Blepharitis  Blepharitis is redness, soreness, and swelling (inflammation) of one or both eyelids. It may be caused by an allergic reaction or a bacterial infection. Blepharitis may  also be associated with reddened, scaly skin (seborrhea) of the scalp and eyebrows. While you sleep, eye discharge may cause your eyelashes to stick together. Your eyelids may itch, burn, swell, and may lose their lashes. These will grow back. Your eyes may become sensitive. Blepharitis may recur and need repeated treatment. If this is the case, you may require further evaluation by an eye specialist (ophthalmologist).  HOME CARE INSTRUCTIONS   · Keep your hands clean.  · Use a clean towel each time you dry your eyelids. Do not use this towel to clean other areas. Do not share a towel or makeup with anyone.  · Wash your eyelids with warm water or warm water mixed with a small amount of baby shampoo. Do this twice a day or as often as needed.  · Wash your face and eyebrows at least once a day.  · Use warm compresses 2 times a day for 10 minutes at a time, or as directed by your caregiver.  · Apply antibiotic ointment as directed by your caregiver.  · Avoid rubbing your eyes.  · Avoid wearing makeup until you get better.  · Follow up with your caregiver as directed.  SEEK IMMEDIATE MEDICAL CARE IF:   · You have pain, redness, or swelling that gets worse or spreads to other parts of your face.  · Your vision changes, or you have pain when looking at lights or moving objects.  · You have a fever.  · Your symptoms continue for longer than 2 to 4 days or become worse.  MAKE SURE YOU:   · Understand these instructions.  · Will watch your condition.  · Will get help right away if you are not doing well or get worse.  Document Released: 04/08/2000 Document Revised: 07/04/2011 Document Reviewed: 05/19/2010  ExitCare® Patient Information ©2015 [employer]. This information is not intended to replace advice given to you by your health care provider. Make sure you discuss any questions you have with your health care provider.

## 2015-01-11 NOTE — ED Provider Notes (Signed)
CSN: [id number]     Arrival date & time 01/11/15  1702 History   First MD Initiated Contact with Patient 01/11/15 1705     Chief Complaint  Patient presents with  . Conjunctivitis   (Consider location/radiation/quality/duration/timing/severity/associated sxs/prior Treatment) HPI Pt is a 29yo female presenting to [facility] with c/o recurrent Right eye conjunctivitis. Pt states she has had Right eye irritation, redness, itching, and discharge intermittently for 4-5 months every 3 weeks. Last month she was treated with Vigamox which symptoms resolved but have now come back.  She does not wear glasses or contacts. Reports mild blurred vision at times.  Denies recent cough, cold, congestion. No known allergies.  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   Past Medical History  Diagnosis Date  . Depression     sees Jorje Guild PA at [facility]  . GERD (gastroesophageal reflux disease)   . HPV (human papilloma virus) infection   . Anxiety   . Abnormal Pap smear of cervix   . Gynecological examination     sees Dr. Henderson Cloud  . Tracheomalacia, congenital   . Brachydactyly of fingers   . Rheumatoid arthritis(714.0)     sees Dr. Azzie Roup   . Pregnancy     due August 10, 2014   Past Surgical History  Procedure Laterality Date  . Hand surgery  1996    to elongate the right 3rd metacarpal   . Lumbar fusion  2008    per Dr. Gerlene Fee on L4-5 for spondylolisthesis   . Back surgery     Family History  Problem Relation Age of Onset  . Arthritis Mother   . Hyperlipidemia Father   . Heart disease Father   . Hypertension Father   . Arthritis Father   . Depression Father   . Anxiety disorder Father   . Arthritis Brother   . Psoriasis Brother   . Cleft lip Brother     without palate  . Uterine cancer Maternal Aunt     x 2  . Seizures Maternal Aunt   . Mental retardation Maternal Aunt     mild  . Colon cancer Neg Hx   . Seizures Cousin   .  Depression Cousin     attempted suicide  . Suicidality Cousin    Social History  Substance Use Topics  . Smoking status: Former Smoker -- 0.50 packs/day    Types: Cigarettes    Quit date: 08/11/2007  . Smokeless tobacco: Never Used  . Alcohol Use: 0.0 oz/week    0 Standard drinks or equivalent per week     Comment: occ   OB History    Gravida Para Term Preterm AB TAB SAB Ectopic Multiple Living   2 2 0 2 0 0 0 0 0 2      Review of Systems  Constitutional: Negative for fever and chills.  HENT: Negative for congestion, rhinorrhea, sinus pressure, sneezing and sore throat.   Eyes: Positive for discharge, redness, itching and visual disturbance. Negative for photophobia and pain.       Right eye  Respiratory: Negative for cough.   Gastrointestinal: Negative for nausea, vomiting and diarrhea.  Musculoskeletal: Negative for neck pain and neck stiffness.  Skin: Negative for rash and wound.  Neurological: Negative for dizziness, light-headedness, numbness and headaches.    Allergies  Nutmeg oil (myristica oil) and Vicodin  Home Medications   Prior to Admission medications   Medication Sig Start Date End Date  Taking? Authorizing Provider  amoxicillin-clavulanate (AUGMENTIN) 875-125 MG per tablet Take 1 tablet by mouth 2 (two) times daily. 12/26/14   Nelwyn Salisbury, MD  buPROPion (WELLBUTRIN XL) 300 MG 24 hr tablet Take 1 tablet (300 mg total) by mouth daily. 09/11/14   Oletta Darter, MD  carboxymethylcellulose 1 % ophthalmic solution Apply 1 drop to eye 3 (three) times daily. Keep refrigerated 01/11/15   Junius Finner, PA-C  diclofenac (VOLTAREN) 75 MG EC tablet Take 1 tablet (75 mg total) by mouth 2 (two) times daily. Patient not taking: Reported on 12/26/2014 10/23/14   Kirstie Peri Regal, DPM  ibuprofen (ADVIL,MOTRIN) 600 MG tablet Take 1 tablet (600 mg total) by mouth every 6 (six) hours. 07/07/14   Julio Sicks, NP  Prenatal Vit-Fe Fumarate-FA (PRENATAL MULTIVITAMIN) TABS Take 1 tablet by  mouth at bedtime.     Historical Provider, MD  sertraline (ZOLOFT) 100 MG tablet Take 1.5 tablets (150 mg total) by mouth daily. 09/11/14   Oletta Darter, MD  trimethoprim-polymyxin b (POLYTRIM) ophthalmic solution Place 1 drop into the right eye every 6 (six) hours. For 7 days 01/11/15   Junius Finner, PA-C   Meds Ordered and Administered this Visit  Medications - No data to display  BP 117/82 mmHg  Pulse 79  Temp(Src) 98.5 F (36.9 C) (Oral)  Resp 16  Ht 5' 0.75" (1.543 m)  Wt 159 lb (72.122 kg)  BMI 30.29 kg/m2  SpO2 98% No data found.   Physical Exam  Constitutional: She is oriented to person, place, and time. She appears well-developed and well-nourished.  HENT:  Head: Normocephalic and atraumatic.  Eyes: EOM are normal. Pupils are equal, round, and reactive to light. Lids are everted and swept, no foreign bodies found. Right eye exhibits discharge. Right eye exhibits no chemosis and no hordeolum. Right conjunctiva is injected.  Right eye: mild erythema of lower lid with mild to moderate amount yellow discharge. No edema. EOM normal. No periorbital tenderness.  Neck: Normal range of motion.  Cardiovascular: Normal rate.   Pulmonary/Chest: Effort normal.  Musculoskeletal: Normal range of motion.  Neurological: She is alert and oriented to person, place, and time.  Skin: Skin is warm and dry.  Psychiatric: She has a normal mood and affect. Her behavior is normal.  Nursing note and vitals reviewed.   ED Course  Procedures (including critical care time)  Labs Review Labs Reviewed - No data to display  Imaging Review No results found.   MDM   1. Conjunctivitis, right eye   2. Blepharitis, right eye     Pt presenting to [facility] with recurrent Right eye conjunctivitis.   Exam concerning for bacterial conjunctivitis and blepharitis.  Rx: Polytrim and Refresh eye drops.  Encouraged to f/u with PCP and/or optometrist in 3-4 days if not improving, sooner if worsening.  Discussed warm compresses to help with discharge. Pt education packet for blepharitis and conjunctivitis provided. Patient verbalized understanding and agreement with treatment plan.    Junius Finner, PA-C 01/11/15 1755

## 2015-02-16 ENCOUNTER — Ambulatory Visit (INDEPENDENT_AMBULATORY_CARE_PROVIDER_SITE_OTHER): Payer: 59 | Admitting: Family Medicine

## 2015-02-16 ENCOUNTER — Encounter: Payer: Self-pay | Admitting: Family Medicine

## 2015-02-16 VITALS — BP 140/77 | HR 79 | Temp 99.0°F | Ht 61.0 in | Wt 163.0 lb

## 2015-02-16 DIAGNOSIS — R0602 Shortness of breath: Secondary | ICD-10-CM | POA: Diagnosis not present

## 2015-02-16 NOTE — Progress Notes (Signed)
Pre visit review using our clinic review tool, if applicable. No additional management support is needed unless otherwise documented below in the visit note. 

## 2015-02-16 NOTE — Progress Notes (Signed)
   Subjective:    Patient ID: [name], female    DOB: [birth year], 29 y.o.   MRN: [id number]  HPI Here to discuss periods of SOB that come and go. We talked about these this past June when she was also having periods of rapid heartbeats. Lately there have not been rapid heartbeats but she still gets SOB. No coughing or chest pains or wheezing. That day in June she had a normal exam and a lab workup was also normal. She had a normal CXR in June of 2015. These episodes occur during exertion such as when climbing stairs but they resolve within a few minutes when she rests. They do not force her to sit down. Today she feels fine. She has a hx of anxiety and depression but she says these are well controlled at this time.    Review of Systems  Constitutional: Negative.   HENT: Negative.   Eyes: Negative.   Respiratory: Positive for shortness of breath. Negative for apnea, cough, choking, chest tightness, wheezing and stridor.   Cardiovascular: Negative.   Endocrine: Negative.   Neurological: Negative.        Objective:   Physical Exam  Constitutional: She is oriented to person, place, and time. She appears well-developed and well-nourished. No distress.  Able to walk down the hall carrying her baby in a baby carrier with no difficulties   Eyes: Conjunctivae are normal. Pupils are equal, round, and reactive to light.  Neck: Neck supple. No thyromegaly present.  Cardiovascular: Normal rate, regular rhythm, normal heart sounds and intact distal pulses.   Pulmonary/Chest: Effort normal and breath sounds normal. No respiratory distress. She has no wheezes. She has no rales. She exhibits no tenderness.  Abdominal: Soft. Bowel sounds are normal. She exhibits no distension and no mass. There is no tenderness. There is no rebound and no guarding.  Lymphadenopathy:    She has no cervical adenopathy.  Neurological: She is alert and oriented to person, place, and time.  Psychiatric: She has a  normal mood and affect. Her behavior is normal. Thought content normal.          Assessment & Plan:  SOB on exertion. It is not clear what the etiology of this may be, possibly asthma. Doubt it is cardiac in nature. We will refer her to Pulmonary for further evaluation.

## 2015-02-23 ENCOUNTER — Encounter: Payer: Self-pay | Admitting: Internal Medicine

## 2015-02-23 ENCOUNTER — Ambulatory Visit (INDEPENDENT_AMBULATORY_CARE_PROVIDER_SITE_OTHER)
Admission: RE | Admit: 2015-02-23 | Discharge: 2015-02-23 | Disposition: A | Discharge status: Home / Self Care | Payer: 59 | Source: Ambulatory Visit | Attending: Internal Medicine | Admitting: Internal Medicine

## 2015-02-23 ENCOUNTER — Other Ambulatory Visit (INDEPENDENT_AMBULATORY_CARE_PROVIDER_SITE_OTHER): Payer: 59

## 2015-02-23 ENCOUNTER — Ambulatory Visit (INDEPENDENT_AMBULATORY_CARE_PROVIDER_SITE_OTHER): Payer: 59 | Admitting: Internal Medicine

## 2015-02-23 VITALS — BP 124/90 | HR 89 | Ht 61.0 in | Wt 160.0 lb

## 2015-02-23 DIAGNOSIS — R0600 Dyspnea, unspecified: Secondary | ICD-10-CM | POA: Diagnosis not present

## 2015-02-23 HISTORY — DX: Dyspnea, unspecified: R06.00

## 2015-02-23 LAB — SEDIMENTATION RATE: Sed Rate: 23 mm/hr — ABNORMAL HIGH (ref 0–22)

## 2015-02-23 LAB — BRAIN NATRIURETIC PEPTIDE: PRO B NATRI PEPTIDE: 17 pg/mL (ref 0.0–100.0)

## 2015-02-23 NOTE — Progress Notes (Signed)
Subjective:    Patient ID: [name], female    DOB: Apr 07, 1986,   MRN: [id number]  HPI  28 yowf quit smoking  with tracheomalacia dx as infant but no memory of resp problems as child or young adult then 2014 while IUP cough  X months then recurred sev months later > allergy eval by [facility] neg/prn inhaler never used and followed by allergy thru 03/2014 when pregant and did fine delivery March 2016 then June 2016 onset of sob just with activity assoc with palpitations > eval by Abran Cantor with neg labs > referred to pulmonary clinic 02/23/2015 by Dr Dr Clent Ridges for unexplained.    02/23/2015 1st [facility] office visit/ [name]   Chief Complaint  Patient presents with  . PULMONARY CONSULT    Pt referred by Dr. Clent Ridges for SOB: pt states in june she started getting SOB with minimal exertion. pt states she had blood work done and everything was normal, and symptoms subsided and now has began again for the past 3 weeks. no c/o wheezing or cough. pt c.o some chest tightness.   onset was acute  in June 2016 was 2 m after started bcps and around 3 m p IUP and gradually improved  Did have leg swelling with iup but resolved both sides s calf pain p IUP Went to [facility] in Sept 2016  before recurrent sob recurred abruptly "just like it did the first time" - ie she has never had shortness of breath at rest or supine but finds that one day she can climb a st of steps fine and the very next day she is very short of breath doing the exact same task. Has the same problem when she walks across a large parking lot assoc each time with sense of chest tightness.  No obvious other patterns in day to day or daytime variabilty or assoc chronic cough or cp or  subjective wheeze overt sinus or hb symptoms. No unusual exp hx or h/o childhood pna/ asthma or knowledge of premature birth.  Sleeping ok without nocturnal  or early am exacerbation  of respiratory  c/o's or need for noct saba. Also denies any obvious fluctuation of  symptoms with weather or environmental changes or other aggravating or alleviating factors except as outlined above   Current Medications, Allergies, Complete Past Medical History, Past Surgical History, Family History, and Social History were reviewed in [employer] record.           Review of Systems  Constitutional: Negative for fever and unexpected weight change.  HENT: Positive for dental problem. Negative for congestion, ear pain, nosebleeds, postnasal drip, rhinorrhea, sinus pressure, sneezing, sore throat and trouble swallowing.   Eyes: Negative for redness and itching.  Respiratory: Positive for chest tightness and shortness of breath. Negative for cough and wheezing.   Cardiovascular: Negative for palpitations and leg swelling.  Gastrointestinal: Negative for nausea and vomiting.  Genitourinary: Negative for dysuria.  Musculoskeletal: Positive for joint swelling.  Skin: Negative for rash.  Neurological: Negative for headaches.  Hematological: Does not bruise/bleed easily.  Psychiatric/Behavioral: Negative for dysphoric mood. The patient is not nervous/anxious.        Objective:   Physical Exam  amb wf nad   Wt Readings from Last 3 Encounters:  02/23/15 160 lb (72.576 kg)  02/16/15 163 lb (73.936 kg)  01/11/15 159 lb (72.122 kg)    Vital signs reviewed   HEENT: nl dentition, turbinates, and oropharynx. Nl external ear canals  without cough reflex   NECK :  without JVD/Nodes/TM/ nl carotid upstrokes bilaterally   LUNGS: no acc muscle use, clear to A and P bilaterally without cough on insp or exp maneuvers   CV:  RRR  no s3 or murmur or increase in P2, no edema   ABD:  soft and nontender with nl excursion in the supine position. No bruits or organomegaly, bowel sounds nl  MS:  warm without deformities, calf tenderness, cyanosis or clubbing  SKIN: warm and dry without lesions    NEURO:  alert, approp, no deficits   Labs ordered d  dimer/ bnp 02/23/2015 >>>   Lab Results  Component Value Date   DDIMER 0.58* 02/23/2015     Lab Results  Component Value Date   PROBNP 17.0 02/23/2015       Lab Results  Component Value Date   ESRSEDRATE 23* 02/23/2015   ESRSEDRATE 10 12/04/2013     CXR PA and Lateral:   02/23/2015 :    I personally reviewed images and agree with radiology impression as follows:   No active cardiopulmonary disease.          Assessment & Plan:

## 2015-02-23 NOTE — Patient Instructions (Signed)
Please remember to go to the lab and x-ray department downstairs for your tests - we will call you with the results when they are available.      

## 2015-02-24 ENCOUNTER — Telehealth: Payer: Self-pay | Admitting: Internal Medicine

## 2015-02-24 ENCOUNTER — Ambulatory Visit (INDEPENDENT_AMBULATORY_CARE_PROVIDER_SITE_OTHER)
Admission: RE | Admit: 2015-02-24 | Discharge: 2015-02-24 | Disposition: A | Discharge status: Home / Self Care | Payer: 59 | Source: Ambulatory Visit | Attending: Internal Medicine | Admitting: Internal Medicine

## 2015-02-24 ENCOUNTER — Other Ambulatory Visit: Payer: Self-pay | Admitting: Internal Medicine

## 2015-02-24 DIAGNOSIS — R0600 Dyspnea, unspecified: Secondary | ICD-10-CM | POA: Diagnosis not present

## 2015-02-24 LAB — D-DIMER, QUANTITATIVE (NOT AT ARMC): D DIMER QUANT: 0.58 ug{FEU}/mL — AB (ref 0.00–0.48)

## 2015-02-24 MED ORDER — IOHEXOL 350 MG/ML SOLN
80.0000 mL | Freq: Once | INTRAVENOUS | Status: AC | PRN
  Administered 2015-02-24: 80 mL via INTRAVENOUS

## 2015-02-24 NOTE — Progress Notes (Signed)
Quick Note:  Spoke with pt and notified of results per Dr. Wert. Pt verbalized understanding and denied any questions.  ______ 

## 2015-02-24 NOTE — Assessment & Plan Note (Addendum)
-   02/23/2015  Walked RA x 3 laps @ 185 ft each stopped due to End of study,  Brisk pace, mild  sob  sats 93%   This is a very interesting pattern of dyspnea and is perfectly reproducible with activity but comes on "suddenly". And is worrisome for recurrent showering of multiple pulmonary emboli. Because the clot burden in this setting is relatively large and d-dimer is an excellent tool to sort out whether we need to proceed with a VQ scan vs CTa (the latter being a better choice if d dimer is relatively low as might show PE vs alternative explanation for her sob, the former only good in very high prob or very low prob setting,  Neither likely here.   DDX = DDX of  difficult airways management all start with A and  include Adherence, Ace Inhibitors, Acid Reflux, Active Sinus Disease, Alpha 1 Antitripsin deficiency, Anxiety masquerading as Airways dz,  ABPA,  allergy(esp in young), Aspiration (esp in elderly), Adverse effects of meds,  Active smokers, A bunch of PE's (a small clot burden can't cause this syndrome unless there is already severe underlying pulm or vascular dz with poor reserve) plus two Bs  = Bronchiectasis and Beta blocker use..and one C= CHF   ? Acid (or non-acid) GERD > always difficult to exclude as up to 75% of pts in some series report no assoc GI/ Heartburn symptoms> rec consider next a trial of max (24h)  acid suppression and diet restrictions/ reviewed and instructions given in writing.   ? Allergies/ asthma > seems very unlikely in the absence of a cough or nocturnal symptoms  ? Anxiety > dx of exclusion, esp since symptoms come only with ex  ? chf > excluded with such a low bnp  Total time devoted to counseling  = 35/42m review case with pt/ discussion of options/alternatives/ giving and going over instructions (see avs)

## 2015-02-24 NOTE — Telephone Encounter (Signed)
Received call from Blue at [facility]. Misty Stanley stated the pt's CT angio is negative for PE, negative for thoracic mass, and negative for acute pulmonary findings. Pt aware we will call her once MW reviews results and recs.   Dr. Sherene Sires please advise. Thanks.   IMPRESSION: 1. No CT findings for pulmonary embolism. 2. Normal thoracic aorta. 3. No mediastinal or hilar mass or adenopathy. 4. No acute pulmonary findings.

## 2015-02-25 MED ORDER — PANTOPRAZOLE SODIUM 40 MG PO TBEC: 40.0000 mg | DELAYED_RELEASE_TABLET | Freq: Every day | ORAL | Status: DC

## 2015-02-25 NOTE — Telephone Encounter (Signed)
See result note. Pt aware.  

## 2015-02-25 NOTE — Progress Notes (Signed)
Quick Note:  Spoke with pt and notified of results per Dr. Wert. Pt verbalized understanding and denied any questions.  ______ 

## 2015-03-17 ENCOUNTER — Ambulatory Visit (INDEPENDENT_AMBULATORY_CARE_PROVIDER_SITE_OTHER): Payer: 59 | Admitting: Psychiatry

## 2015-03-17 ENCOUNTER — Encounter (HOSPITAL_COMMUNITY): Payer: Self-pay | Admitting: Psychiatry

## 2015-03-17 VITALS — BP 104/72 | HR 97 | Ht 61.0 in | Wt 162.6 lb

## 2015-03-17 DIAGNOSIS — F411 Generalized anxiety disorder: Secondary | ICD-10-CM | POA: Diagnosis not present

## 2015-03-17 DIAGNOSIS — F331 Major depressive disorder, recurrent, moderate: Secondary | ICD-10-CM | POA: Diagnosis not present

## 2015-03-17 MED ORDER — SERTRALINE HCL 100 MG PO TABS: 200.0000 mg | ORAL_TABLET | Freq: Every day | ORAL | Status: DC

## 2015-03-17 MED ORDER — BUPROPION HCL ER (XL) 300 MG PO TB24: 300.0000 mg | ORAL_TABLET | Freq: Every day | ORAL | Status: DC

## 2015-03-17 NOTE — Progress Notes (Signed)
[facility] [id number] Progress Note  [name] [id number] 29 y.o.  03/17/2015 10:11 AM  Chief Complaint: "ok"  History of Present Illness: Here with 34mo daughter. Pt and husband had a few marital counseling sessions over the summer. States it was helpful.  He is back in school so doesn't have time now. They plan to restart when he has time off again.   Pt is social and is involved in a mommy and me group. Pt is working out regularly.   Depression is coming and going at least once a week. She has been so busy she doesn't have time to think.  Feels it coincides with her husbands school. Husband is gone a lot for work and school. Pt feels almost like a single mom and lonely and unappreciated. They have talked about it and he is trying to be more attentive.  Denies anhedonia and worthlessness, isolation and frequent crying spells. On days she feels down she has low motivaiton and no desire to go out.   Sleeping ok.Marland Kitchen Appetite and energy are good. Concentration is so/so.   Anxiety is increased. Pt continues to pull her eyelashes and has decreased to one day every other week.   She feels she see her cat in the corner of her eye 4-5x/day. It is brief and is improving. Sates it was the same timeline with her son.   States Wellbutrin and Zoloft have helped a lot. She is taking them as prescribed. Libido is decreased.   Suicidal Ideation: No Plan Formed: No Patient has means to carry out plan: No  Homicidal Ideation: No Plan Formed: No Patient has means to carry out plan: No  Review of Systems: Psychiatric: Agitation: Yes Hallucination: Yes Depressed Mood: Yes Insomnia: No Hypersomnia: No Altered Concentration: Yes Feels Worthless: No Grandiose Ideas: No Belief In Special Powers: No New/Increased Substance Abuse: No Compulsions: No  Neurologic: Headache: Yes Seizure: No Paresthesias: No   Review of Systems  Constitutional: Negative for fever, chills and weight  loss.  HENT: Negative for congestion, hearing loss, sore throat and tinnitus.   Eyes: Negative for blurred vision, double vision and redness.  Respiratory: Positive for shortness of breath. Negative for cough and wheezing.        Working with Pulmonologist  Cardiovascular: Negative for chest pain, palpitations and leg swelling.  Gastrointestinal: Negative for heartburn, nausea, vomiting and abdominal pain.  Musculoskeletal: Positive for neck pain. Negative for myalgias, back pain and joint pain.  Skin: Negative for itching and rash.  Neurological: Positive for headaches. Negative for dizziness, focal weakness, seizures, loss of consciousness and weakness.  Psychiatric/Behavioral: Positive for depression. Negative for suicidal ideas, hallucinations and substance abuse. The patient is nervous/anxious. The patient does not have insomnia.     Past Medical Family, Social History: Pt is married and has 2 kids. She works as a Engineer, civil (consulting) at [employer]. Pt drinks wine 3x/week and is a former smoker. She denies drug use. Pt reports family hx of depression and anxiety in her father. Also reports family of depression in her cousin who attempted suicide.   Past Medical History  Diagnosis Date  . Depression     sees Jorje Guild PA at [facility]  . GERD (gastroesophageal reflux disease)   . HPV (human papilloma virus) infection   . Anxiety   . Abnormal Pap smear of cervix   . Gynecological examination     sees Dr. Henderson Cloud  . Tracheomalacia, congenital   . Brachydactyly of fingers   .  Rheumatoid arthritis(714.0)     sees Dr. Azzie Roup   . Pregnancy     due August 10, 2014    Outpatient Encounter Prescriptions as of 03/17/2015  Medication Sig  . buPROPion (WELLBUTRIN XL) 300 MG 24 hr tablet Take 1 tablet (300 mg total) by mouth daily.  Marland Kitchen ibuprofen (ADVIL,MOTRIN) 600 MG tablet Take 1 tablet (600 mg total) by mouth every 6 (six) hours.  . norgestrel-ethinyl estradiol (LO/OVRAL,CRYSELLE) 0.3-30  MG-MCG tablet Take 1 tablet by mouth daily.  . pantoprazole (PROTONIX) 40 MG tablet Take 1 tablet (40 mg total) by mouth daily before breakfast.  . Prenatal Vit-Fe Fumarate-FA (PRENATAL MULTIVITAMIN) TABS Take 1 tablet by mouth at bedtime.   . sertraline (ZOLOFT) 100 MG tablet Take 1.5 tablets (150 mg total) by mouth daily.   No facility-administered encounter medications on file as of 03/17/2015.    Past Psychiatric History/Hospitalization(s): Anxiety: Yes Bipolar Disorder: No Depression: Yes Mania: No Psychosis: No Schizophrenia: No Personality Disorder: No Hospitalization for psychiatric illness: No History of Electroconvulsive Shock Therapy: No Prior Suicide Attempts: No  Physical Exam: Constitutional:  BP 104/72 mmHg  Pulse 97  Ht 5\' 1"  (1.549 m)  Wt 162 lb 9.6 oz (73.755 kg)  BMI 30.74 kg/m2  General Appearance: alert, oriented, no acute distress and well nourished  Musculoskeletal: Strength & Muscle Tone: within normal limits Gait & Station: normal Patient leans: N/A  Mental Status Examination/Evaluation: Objective: Attitude: Calm and cooperative  Appearance: Casual, appears to be stated age  Eye Contact::  Good  Speech:  Clear and Coherent and Normal Rate  Volume:  Normal  Mood:  depressed  Affect:  Congruent   Thought Process:  Intact, Linear and Logical  Orientation:  Full (Time, Place, and Person)  Thought Content:  Negative  Suicidal Thoughts:  No  Homicidal Thoughts:  No  Judgement:  Fair  Insight:  Fair  Concentration: good  Memory: Immediate-fair Recent-fair Remote-fair  Recall: fair  Language: fair  Gait and Station: normal  [id number] of Knowledge: average  Psychomotor Activity:  Normal  Akathisia:  No  Handed:  Right  AIMS (if indicated): n/a        Medical Decision Making (Choose Three): Review of Psycho-Social Stressors (1), Review or order clinical lab tests (1), Established Problem, Worsening (2), Review of Last Therapy  Session (1), Review of Medication Regimen & Side Effects (2) and Review of New Medication or Change in Dosage (2)   Assessment: Axis I: GAD, MDD- recurrent, moderate Axis II: deferred  Axis III:  Past Medical History  Diagnosis Date  . Depression     sees [employer] PA at [facility]  . GERD (gastroesophageal reflux disease)   . HPV (human papilloma virus) infection   . Anxiety   . Abnormal Pap smear of cervix   . Gynecological examination     sees [facility]. [facility]  . Tracheomalacia, congenital   . Brachydactyly of fingers   . Rheumatoid arthritis(714.0)     sees Dr. Henderson Cloud   . Pregnancy     due August 10, 2014    Axis IV: anxiety, work stress, marital stressrors Axis V: GAF of 75    Plan: increase Zoloft to 200 mg daily for anxiety and depression. Pt understands that libido issues could become worse with increased dose and agreed to try it.  Continue Wellbutrin XL 300mg  daily for depression -Risks and benefits, side effects and alternatives discussed with patient, pt was given an opportunity to ask questions  about medication, illness, and treatment. All current psychiatric medications have been reviewed and discussed with the patient and adjusted as clinically appropriate. The patient has been provided an accurate and updated list of the medications being now prescribed.  Labs- reviewed WNL on 10/20/2014  Pt will restart therapy soon.  Therapy: brief supportive therapy provided. Discussed psychosocial stressors in detail. Encouraged to continue exercise.  Pt denies SI and is at an acute low risk for suicide.Patient told to call clinic if any problems occur. Patient advised to go to ER if they should develop SI/HI, side effects, or if symptoms worsen. Has crisis numbers to call if needed. Pt verbalized understanding.  F/up in 3 months or sooner if needed  Oletta Darter, MD 03/17/2015

## 2015-03-25 ENCOUNTER — Telehealth: Payer: Self-pay | Admitting: Internal Medicine

## 2015-03-25 NOTE — Telephone Encounter (Signed)
lmomtcb x1 for pt 

## 2015-03-25 NOTE — Telephone Encounter (Signed)
Patient returned call c/o increased SOB. Patient states SOB has increased since last ov with MW on 02/23/15. Patient was given Pantoprazole 40mg  Take daily at breakfast, and states this has not seemed to help SOB. I offered ov with MW on 03/27/15 but patient refused stating she was unable to make it due to work schedule. I scheduled ov with TP on 03/31/15 at 11:30am. Patient voiced understanding and had no further questions. Nothing further needed. Will sign off on message.

## 2015-03-27 ENCOUNTER — Ambulatory Visit: Payer: Self-pay | Admitting: Internal Medicine

## 2015-03-31 ENCOUNTER — Encounter: Payer: Self-pay | Admitting: Adult Health

## 2015-03-31 ENCOUNTER — Ambulatory Visit (INDEPENDENT_AMBULATORY_CARE_PROVIDER_SITE_OTHER): Payer: 59 | Admitting: Adult Health

## 2015-03-31 VITALS — BP 110/70 | HR 72 | Temp 97.8°F | Ht 63.0 in | Wt 160.0 lb

## 2015-03-31 DIAGNOSIS — R0600 Dyspnea, unspecified: Secondary | ICD-10-CM | POA: Diagnosis not present

## 2015-03-31 NOTE — Patient Instructions (Signed)
Continue on current regimen  We are setting you up for a CPST .  Follow up Dr. Sherene Sires  In 6 weeks and As needed

## 2015-03-31 NOTE — Progress Notes (Signed)
Subjective:    Patient ID: [name], female    DOB: 01-18-1986,   MRN: [id number]  HPI  28 yowf quit smoking  with tracheomalacia dx as infant but no memory of resp problems as child or young adult then 2014 while IUP cough  X months then recurred sev months later > allergy eval by vanWinkle neg/prn inhaler never used and followed by allergy thru 03/2014 when pregant and did fine delivery March 2016 then June 2016 onset of sob just with activity assoc with palpitations > eval by Abran Cantor with neg labs > referred to pulmonary clinic 02/23/2015 by Dr Dr Clent Ridges for unexplained.    02/23/2015 1st [facility] office visit/ Wert   Chief Complaint  Patient presents with  . PULMONARY CONSULT    Pt referred by Dr. Clent Ridges for SOB: pt states in june she started getting SOB with minimal exertion. pt states she had blood work done and everything was normal, and symptoms subsided and now has began again for the past 3 weeks. no c/o wheezing or cough. pt c.o some chest tightness.   onset was acute  in June 2016 was 2 m after started bcps and around 3 m p IUP and gradually improved  Did have leg swelling with iup but resolved both sides s calf pain p IUP Went to [facility] in Sept 2016  before recurrent sob recurred abruptly "just like it did the first time" - ie she has never had shortness of breath at rest or supine but finds that one day she can climb a st of steps fine and the very next day she is very short of breath doing the exact same task. Has the same problem when she walks across a large parking lot assoc each time with sense of chest tightness. .>labs and CT chest   03/31/2015 Follow up : Dyspnea Returns for follow up for dyspnea/DOE.  Complains over last 6 months that she developed DOE. This started after delivery of her second child in March this year. Seen by PCP initially with nml labs  Seen by Dr. Sherene Sires  For consult 02/23/15 .  She was set up for CT chest angio which was neg for PE. W/ no acute  proces.  BNP was nml .  She continues to get sob with activity . Feels she can not exercise. Has no chest pain, syncope or dizziness.  Spirometry today is normal with no airflow obstruction or restrictive dz.  FEV1 105%, ratio 80 and FVC 114%.  Walk test in Oct with no desats on room air.         Current Medications, Allergies, Complete Past Medical History, Past Surgical History, Family History, and Social History were reviewed in [employer] record.           Review of Systems  Constitutional:   No  weight loss, night sweats,  Fevers, chills, fatigue, or  lassitude.  HEENT:   No headaches,  Difficulty swallowing,  Tooth/dental problems, or  Sore throat,                No sneezing, itching, ear ache, nasal congestion, post nasal drip,   CV:  No chest pain,  Orthopnea, PND, swelling in lower extremities, anasarca, dizziness, palpitations, syncope.   GI  No heartburn, indigestion, abdominal pain, nausea, vomiting, diarrhea, change in bowel habits, loss of appetite, bloody stools.   Resp:   No excess mucus, no productive cough,  No non-productive cough,  No coughing  up of blood.  No change in color of mucus.  No wheezing.  No chest wall deformity  Skin: no rash or lesions.  GU: no dysuria, change in color of urine, no urgency or frequency.  No flank pain, no hematuria   MS:  No joint pain or swelling.  No decreased range of motion.  No back pain.  Psych:  No change in mood or affect. No depression or anxiety.  No memory loss.          Objective:   Physical Exam  amb wf nad   Vital signs reviewed   HEENT: nl dentition, turbinates, and oropharynx. Nl external ear canals without cough reflex   NECK :  without JVD/Nodes/TM/ nl carotid upstrokes bilaterally   LUNGS: no acc muscle use, clear to A and P bilaterally without cough on insp or exp maneuvers   CV:  RRR  no s3 or murmur or increase in P2, no edema   ABD:  soft and nontender with nl  excursion in the supine position. No bruits or organomegaly, bowel sounds nl  MS:  warm without deformities, calf tenderness, cyanosis or clubbing  SKIN: warm and dry without lesions    NEURO:  alert, approp, no deficits   Labs ordered d dimer/ bnp 02/23/2015 >>>   Lab Results  Component Value Date   DDIMER 0.58* 02/23/2015     Lab Results  Component Value Date   PROBNP 17.0 02/23/2015       Lab Results  Component Value Date   ESRSEDRATE 23* 02/23/2015   ESRSEDRATE 10 12/04/2013     CXR PA and Lateral:   02/23/2015 :      No active cardiopulmonary disease.      CT chest 02/24/15 -reviewed independently  No CT findings for pulmonary embolism. 2. Normal thoracic aorta. 3. No mediastinal or hilar mass or adenopathy. 4. No acute pulmonary findings.    Assessment & Plan:

## 2015-04-01 NOTE — Assessment & Plan Note (Addendum)
Dyspnea/DOE ? Etiology  Workup thus far has been unrevaling with neg CT chest angio, nml spirometry , nml labs and no ambulatory desats.  Test results reviewed w/ pt in detail  Will set pt up for a CPST to evaluate exercise tolerance.

## 2015-04-01 NOTE — Progress Notes (Signed)
Chart and office note reviewed in detail  > agree with a/p as outlined including proceeding with cpst at this point

## 2015-04-09 ENCOUNTER — Ambulatory Visit (HOSPITAL_COMMUNITY): Payer: 59 | Attending: Family Medicine

## 2015-04-09 DIAGNOSIS — R0600 Dyspnea, unspecified: Secondary | ICD-10-CM | POA: Diagnosis present

## 2015-04-19 DIAGNOSIS — R0602 Shortness of breath: Secondary | ICD-10-CM | POA: Diagnosis not present

## 2015-04-21 ENCOUNTER — Ambulatory Visit: Payer: Self-pay | Admitting: Internal Medicine

## 2015-04-21 ENCOUNTER — Telehealth: Payer: Self-pay | Admitting: Internal Medicine

## 2015-04-21 DIAGNOSIS — R0600 Dyspnea, unspecified: Secondary | ICD-10-CM

## 2015-04-21 NOTE — Telephone Encounter (Signed)
Notes Recorded by Nyoka Cowden, MD on 04/19/2015 at 8:59 AM Call patient : Study is suggestive of a circulatory, not pulmonary problem so next step is echo then ov with me a day later to review all the data together --  ECHO is scheduled for 12/28 at 9:30. Dr. Sherene Sires you do not have any openings in the afternoon. Do you want to overbook?

## 2015-04-21 NOTE — Telephone Encounter (Signed)
Notes Recorded by Christen Butter, CMA on 04/21/2015 at 10:34 AM LMTCB Notes Recorded by Nyoka Cowden, MD on 04/19/2015 at 8:59 AM Call patient : Study is suggestive of a circulatory, not pulmonary problem so next step is echo then ov with me a day later to review all the data together ---------------------------- Pt is aware of results. Echo has been ordered. Nothing further was needed.

## 2015-04-21 NOTE — Telephone Encounter (Signed)
No I'll just call her the result

## 2015-04-21 NOTE — Progress Notes (Signed)
Quick Note:  LMTCB ______ 

## 2015-04-21 NOTE — Telephone Encounter (Signed)
Pt is aware that MW will call her with the results of her ECHO; no appt needed.

## 2015-04-22 ENCOUNTER — Ambulatory Visit (HOSPITAL_COMMUNITY): Payer: 59 | Attending: Cardiovascular Disease

## 2015-04-22 ENCOUNTER — Other Ambulatory Visit: Payer: Self-pay

## 2015-04-22 DIAGNOSIS — I340 Nonrheumatic mitral (valve) insufficiency: Secondary | ICD-10-CM | POA: Diagnosis not present

## 2015-04-22 DIAGNOSIS — R0600 Dyspnea, unspecified: Secondary | ICD-10-CM | POA: Insufficient documentation

## 2015-04-22 DIAGNOSIS — Z87891 Personal history of nicotine dependence: Secondary | ICD-10-CM | POA: Diagnosis not present

## 2015-04-23 NOTE — Progress Notes (Signed)
Quick Note:  Spoke with pt and notified of results per Dr. Wert. Pt verbalized understanding and denied any questions.  ______ 

## 2015-04-29 MED FILL — SERTRALINE HCL 100 MG TAB: 100 | 30 days supply | Qty: 60 | Fill #1

## 2015-04-29 MED FILL — BUPROPION HCL XL 300 MG TAB: 300 | 30 days supply | Qty: 30 | Fill #0

## 2015-04-29 MED FILL — NORETHINDRONE 0.35 MG TAB: 0.35 | 84 days supply | Qty: 84 | Fill #3

## 2015-05-01 ENCOUNTER — Other Ambulatory Visit (HOSPITAL_COMMUNITY): Payer: Self-pay

## 2015-05-12 ENCOUNTER — Ambulatory Visit (INDEPENDENT_AMBULATORY_CARE_PROVIDER_SITE_OTHER): Payer: 59 | Admitting: Internal Medicine

## 2015-05-12 ENCOUNTER — Encounter: Payer: Self-pay | Admitting: Internal Medicine

## 2015-05-12 VITALS — BP 112/76 | HR 80 | Ht 60.75 in | Wt 158.0 lb

## 2015-05-12 DIAGNOSIS — R0600 Dyspnea, unspecified: Secondary | ICD-10-CM

## 2015-05-12 NOTE — Patient Instructions (Signed)
Weight control is simply a matter of calorie balance which needs to be tilted in your favor by eating less and exercising more.  To get the most out of exercise, you need to be continuously aware that you are short of breath, but never out of breath, for 30 minutes daily. As you improve, it will actually be easier for you to do the same amount of exercise  in  30 minutes so always push to the level where you are short of breath.  If this does not result in gradual weight reduction then I strongly recommend you see a nutritionist with a food diary x 2 weeks so that we can work out a negative calorie balance which is universally effective in steady weight loss programs.  Think of your calorie balance like you do your bank account where in this case you want the balance to go down so you must take in less calories than you burn up.  It's just that simple:  Hard to do, but easy to understand.  Good luck!   If not making progress after 6-8 weeks to see Dr Monica Becton to review ex tests

## 2015-05-12 NOTE — Assessment & Plan Note (Signed)
-   02/23/2015  Walked RA x 3 laps @ 185 ft each stopped due to End of study,  Brisk pace, mild  sob  sats 93%    - D dimer   02/23/15 Pos 0.58 > CTa neg for PE or ILD  03/31/15 >FEV1 105%, ratio 80 and FVC 114%.  - CPST  04/09/15>  There is no evidence of exercise-induced bronchospasm or ventilatory limitations. Low OUES and O2 pulse combined with resulted low PVO2 could suggest mild circulatory limitation (possibly diastolic dysfynction).> rec Echo done 04/22/15 wnl    I had an extended final summary discussion with the patient reviewing all relevant studies completed to date and  lasting 15 to 20 minutes of a 25 minute visit on the following issues:    1) reviewed results of cpst/echo done since her last ov  2) borderline circulatory issues and deconditioning are not separable based on cpst but the latter will improve with reg submax exercise  3) reconditioning reviewed  4) f/u Ramaswamy/ cards prn

## 2015-05-12 NOTE — Progress Notes (Signed)
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  with tracheomalacia dx as infant but no memory of resp problems as child or young adult then 2014 while IUP cough  X months then recurred sev months later > allergy eval by [facility]inkle neg/prn inhaler never used and followed by allergy thru 03/2014 when pregant and did fine delivery March 2016 then June 2016 onset of sob just with activity assoc with palpitations > eval by Abran Cantor with neg labs > referred to pulmonary clinic 02/23/2015 by Dr Dr Clent Ridges for unexplained sob    History of Present Illness  02/23/2015 1st [facility] office visit/ [name]   Chief Complaint  Patient presents with  . PULMONARY CONSULT    Pt referred by Dr. Clent Ridges for SOB: pt states in june she started getting SOB with minimal exertion. pt states she had blood work done and everything was normal, and symptoms subsided and now has began again for the past 3 weeks. no c/o wheezing or cough. pt c.o some chest tightness.   onset was acute  in June 2016 was 2 m after started bcps and around 3 m p IUP and gradually improved  Did have leg swelling with iup but resolved both sides s calf pain p IUP Went to [facility] in Sept 2016  before recurrent sob recurred abruptly "just like it did the first time" - ie she has never had shortness of breath at rest or supine but finds that one day she can climb a set of steps fine and the very next day she is very short of breath doing the exact same task. Has the same problem when she walks across a large parking lot assoc each time with sense of chest tightness.  >labs and CTa chest neg > trial gerd rx/ no better   03/31/2015 NP Follow up : Dyspnea Returns for follow up for dyspnea/DOE.  Complains over last 6 months that she developed DOE. This started after delivery of her second child in March this year. Seen by PCP initially with nml  labs  Seen by Dr. Sherene Sires  For consult 02/23/15 .  She was set up for CT chest angio which was neg for PE. W/ no acute proces.  BNP was nml .  She continues to get sob with activity . Feels she can not exercise. Has no chest pain, syncope or dizziness.  Spirometry today is normal with no airflow obstruction or restrictive dz.  FEV1 105%, ratio 80 and FVC 114%.  Walk test in Oct with no desats on room air.  rec Continue on current regimen  We are setting you up for a CPST > no ventilatory limitation ? Circulatory ? > echo done and nl   05/12/2015  f/u ov/[name] re: unexplained sob  Chief Complaint  Patient presents with  . Follow-up    Pt states her breathing is unchanged. No new co's today.    doe x same parking lot was variable now consistent has to stop for a minute when arrives at work  then resumes her day  No obvious day to day or daytime variability or assoc chronic cough or cp or chest tightness, subjective wheeze or overt sinus or hb symptoms. No unusual exp hx or h/o childhood pna/ asthma or knowledge of premature birth.  Sleeping ok without nocturnal  or  early am exacerbation  of respiratory  c/o's or need for noct saba. Also denies any obvious fluctuation of symptoms with weather or environmental changes or other aggravating or alleviating factors except as outlined above   Current Medications, Allergies, Complete Past Medical History, Past Surgical History, Family History, and Social History were reviewed in [employer] record.  ROS  The following are not active complaints unless bolded sore throat, dysphagia, dental problems, itching, sneezing,  nasal congestion or excess/ purulent secretions, ear ache,   fever, chills, sweats, unintended wt loss, classically pleuritic or exertional cp, hemoptysis,  orthopnea pnd or leg swelling, presyncope, palpitations, abdominal pain, anorexia, nausea, vomiting, diarrhea  or change in bowel or bladder habits, change in  stools or urine, dysuria,hematuria,  rash, arthralgias, visual complaints, headache, numbness, weakness or ataxia or problems with walking or coordination,  change in mood/affect or memory.     Objective:   Physical Exam  amb wf nad / baseline wt 145   Wt Readings from Last 3 Encounters:  05/12/15 158 lb (71.668 kg)  03/31/15 160 lb (72.576 kg)  03/17/15 162 lb 9.6 oz (73.755 kg)    Vital signs reviewed    HEENT: nl dentition, turbinates, and oropharynx. Nl external ear canals without cough reflex   NECK :  without JVD/Nodes/TM/ nl carotid upstrokes bilaterally   LUNGS: no acc muscle use, clear to A and P bilaterally without cough on insp or exp maneuvers   CV:  RRR  no s3 or murmur or increase in P2, no edema   ABD:  soft and nontender with nl excursion in the supine position. No bruits or organomegaly, bowel sounds nl  MS:  warm without deformities, calf tenderness, cyanosis or clubbing  SKIN: warm and dry without lesions    NEURO:  alert, approp, no deficits              Assessment & Plan:

## 2015-05-13 ENCOUNTER — Encounter: Payer: Self-pay | Admitting: Internal Medicine

## 2015-05-26 DIAGNOSIS — G501 Atypical facial pain: Secondary | ICD-10-CM | POA: Diagnosis not present

## 2015-05-26 DIAGNOSIS — M2669 Other specified disorders of temporomandibular joint: Secondary | ICD-10-CM | POA: Diagnosis not present

## 2015-06-18 ENCOUNTER — Ambulatory Visit (INDEPENDENT_AMBULATORY_CARE_PROVIDER_SITE_OTHER): Payer: 59 | Admitting: Psychiatry

## 2015-06-18 ENCOUNTER — Encounter (HOSPITAL_COMMUNITY): Payer: Self-pay | Admitting: Psychiatry

## 2015-06-18 VITALS — BP 104/68 | HR 87 | Ht 60.75 in | Wt 154.8 lb

## 2015-06-18 DIAGNOSIS — F411 Generalized anxiety disorder: Secondary | ICD-10-CM | POA: Diagnosis not present

## 2015-06-18 DIAGNOSIS — F331 Major depressive disorder, recurrent, moderate: Secondary | ICD-10-CM | POA: Diagnosis not present

## 2015-06-18 MED ORDER — SERTRALINE HCL 100 MG PO TABS: 150.0000 mg | ORAL_TABLET | Freq: Every day | ORAL | Status: DC

## 2015-06-18 MED ORDER — BUPROPION HCL ER (XL) 300 MG PO TB24: 300.0000 mg | ORAL_TABLET | Freq: Every day | ORAL | Status: DC

## 2015-06-18 MED FILL — SERTRALINE HCL 100 MG TAB: 100 | 30 days supply | Qty: 45 | Fill #0

## 2015-06-18 MED FILL — BUPROPION HCL XL 300 MG TAB: 300 | 30 days supply | Qty: 30 | Fill #0

## 2015-06-18 NOTE — Progress Notes (Signed)
Patient ID: [name], female   DOB: [birth year], 30 y.o.   MRN: [id number]  [facility] [id number] Progress Note  [name] [id number] 30 y.o.  06/18/2015 10:12 AM  Chief Complaint: "pretty good"  History of Present Illness: Here with 23mo daughter. Pt and husband had a few marital counseling sessions over the summer. States it was helpful.  He is back in school but is graduating in May.   Pt is social and is involved in a mommy and me group. Pt is working out regularly.   Depression is doing well. Reports ongoing low motivation but it is manageable. Denies anhedonia, isolation, crying spells, poor hygiene, worthlessness and hopelessness.   Sleeping good and getting about 8 hrs. Appetite is good and energy is low. Concentration is so/so.   Anxiety is up an down. It spikes when husband is not around, in social situaitons and work.   Pt continues to pull her eyelashes at a variable frequency.   States Wellbutrin and Zoloft have helped a lot. She is taking them as prescribed. Libido is decreased so she would like to decrease Zoloft.   Suicidal Ideation: No Plan Formed: No Patient has means to carry out plan: No  Homicidal Ideation: No Plan Formed: No Patient has means to carry out plan: No  Review of Systems: Psychiatric: Agitation: No Hallucination: No Depressed Mood: No Insomnia: No Hypersomnia: No Altered Concentration: Yes Feels Worthless: No Grandiose Ideas: No Belief In Special Powers: No New/Increased Substance Abuse: No Compulsions: No   Review of Systems  Constitutional: Negative for fever, chills and weight loss.  HENT: Negative for congestion, hearing loss, sore throat and tinnitus.   Eyes: Negative for blurred vision, double vision and redness.  Respiratory: Positive for shortness of breath. Negative for cough and wheezing.        Working with Pulmonologist  Cardiovascular: Negative for chest pain, palpitations and leg swelling.   Gastrointestinal: Negative for heartburn, nausea, vomiting and abdominal pain.  Musculoskeletal: Positive for neck pain. Negative for myalgias, back pain and joint pain.  Skin: Negative for itching and rash.  Neurological: Positive for headaches. Negative for dizziness, tremors, focal weakness, seizures, loss of consciousness and weakness.  Psychiatric/Behavioral: Negative for depression, suicidal ideas, hallucinations and substance abuse. The patient is nervous/anxious. The patient does not have insomnia.     Past Medical Family, Social History: Pt is married and has 2 kids. 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 Pt drinks wine 3x/week and is a former smoker. She denies drug use. Pt reports family hx of depression and anxiety in her father. Also reports family of depression in her cousin who attempted suicide.   Past Medical History  Diagnosis Date  . Depression     sees Jorje Guild [facility] at [facility]  . GERD (gastroesophageal reflux disease)   . HPV (human papilloma virus) infection   . Anxiety   . Abnormal Pap smear of cervix   . Gynecological examination     sees Dr. Henderson Cloud  . Tracheomalacia, congenital   . Brachydactyly of fingers   . Rheumatoid arthritis(714.0)     sees Dr. Azzie Roup   . Pregnancy     due August 10, 2014    Outpatient Encounter Prescriptions as of 06/18/2015  Medication Sig  . buPROPion (WELLBUTRIN XL) 300 MG 24 hr tablet Take 1 tablet (300 mg total) by mouth daily.  Marland Kitchen ibuprofen (ADVIL,MOTRIN) 600 MG tablet Take 1 tablet (600 mg total)  by mouth every 6 (six) hours.  . norgestrel-ethinyl estradiol (LO/OVRAL,CRYSELLE) 0.3-30 MG-MCG tablet Take 1 tablet by mouth daily.  . Prenatal Vit-Fe Fumarate-FA (PRENATAL MULTIVITAMIN) TABS Take 1 tablet by mouth at bedtime.   . sertraline (ZOLOFT) 100 MG tablet Take 2 tablets (200 mg total) by mouth daily.   No facility-administered encounter medications on file as of 06/18/2015.    Past  Psychiatric History/Hospitalization(s): Anxiety: Yes Bipolar Disorder: No Depression: Yes Mania: No Psychosis: No Schizophrenia: No Personality Disorder: No Hospitalization for psychiatric illness: No History of Electroconvulsive Shock Therapy: No Prior Suicide Attempts: No  Physical Exam: Constitutional:  BP 104/68 mmHg  Pulse 87  Ht 5' 0.75" (1.543 m)  Wt 154 lb 12.8 oz (70.217 kg)  BMI 29.49 kg/m2  General Appearance: alert, oriented, no acute distress and well nourished  Musculoskeletal: Strength & Muscle Tone: within normal limits Gait & Station: normal Patient leans: N/A  Mental Status Examination/Evaluation: Objective: Attitude: Calm and cooperative  Appearance: Casual, appears to be stated age  Eye Contact::  Good  Speech:  Clear and Coherent and Normal Rate  Volume:  Normal  Mood:  anxious  Affect:  Congruent   Thought Process:  Intact, Linear and Logical  Orientation:  Full (Time, Place, and Person)  Thought Content:  Negative  Suicidal Thoughts:  No  Homicidal Thoughts:  No  Judgement:  Fair  Insight:  Fair  Concentration: good  Memory: Immediate-fair Recent-fair Remote-fair  Recall: fair  Language: fair  Gait and Station: normal  [employer] of Knowledge: average  Psychomotor Activity:  Normal  Akathisia:  No  Handed:  Right  AIMS (if indicated): n/a        Medical Decision Making (Choose Three): Established Problem, Stable/Improving (1), Review of Psycho-Social Stressors (1), Review of Last Therapy Session (1), Review of Medication Regimen & Side Effects (2) and Review of New Medication or Change in Dosage (2)   Assessment: Axis I: GAD, MDD- recurrent, moderate Axis II: deferred     Plan: decrease Zoloft to 150 mg daily for anxiety and depression. Pt understands that libido issues could decrease with decreased dose and other symptoms could worsen. Pt will monitor and call if symptoms worsen.  Continue Wellbutrin XL 300mg  daily for  depression -Risks and benefits, side effects and alternatives discussed with patient, pt was given an opportunity to ask questions about medication, illness, and treatment. All current psychiatric medications have been reviewed and discussed with the patient and adjusted as clinically appropriate. The patient has been provided an accurate and updated list of the medications being now prescribed.  Labs- reviewed WNL on 10/20/2014  Pt is in marriage therapy with 10/22/2014.  Therapy: brief supportive therapy provided. Discussed psychosocial stressors in detail. Encouraged to continue exercise.  Pt denies SI and is at an acute low risk for suicide.Patient told to call clinic if any problems occur. Patient advised to go to ER if they should develop SI/HI, side effects, or if symptoms worsen. Has crisis numbers to call if needed. Pt verbalized understanding.  F/up in 2 months or sooner if needed  Jerral Bonito, MD 06/18/2015

## 2015-07-14 MED FILL — NORETHINDRONE 0.35 MG TAB: 0.35 | 28 days supply | Qty: 28 | Fill #4

## 2015-07-28 MED FILL — BUPROPION HCL XL 300 MG TAB: 300 | 30 days supply | Qty: 30 | Fill #1

## 2015-08-18 MED FILL — NORETHINDRONE 0.35 MG TAB: 0.35 | 28 days supply | Qty: 28 | Fill #0

## 2015-08-25 DIAGNOSIS — Z01419 Encounter for gynecological examination (general) (routine) without abnormal findings: Secondary | ICD-10-CM | POA: Diagnosis not present

## 2015-08-25 DIAGNOSIS — Z6830 Body mass index (BMI) 30.0-30.9, adult: Secondary | ICD-10-CM | POA: Diagnosis not present

## 2015-08-28 MED FILL — BUPROPION HCL XL 300 MG TAB: 300 | 30 days supply | Qty: 30 | Fill #1

## 2015-09-01 MED FILL — SERTRALINE HCL 100 MG TAB: 100 | 30 days supply | Qty: 45 | Fill #1

## 2015-09-08 DIAGNOSIS — Z30017 Encounter for initial prescription of implantable subdermal contraceptive: Secondary | ICD-10-CM | POA: Diagnosis not present

## 2015-09-14 ENCOUNTER — Encounter: Payer: Self-pay | Admitting: Family Medicine

## 2015-09-14 ENCOUNTER — Ambulatory Visit (INDEPENDENT_AMBULATORY_CARE_PROVIDER_SITE_OTHER): Payer: 59 | Admitting: Family Medicine

## 2015-09-14 VITALS — BP 98/65 | HR 73 | Temp 98.6°F | Ht 60.75 in | Wt 164.0 lb

## 2015-09-14 DIAGNOSIS — H65191 Other acute nonsuppurative otitis media, right ear: Secondary | ICD-10-CM | POA: Diagnosis not present

## 2015-09-14 MED ORDER — OXYCODONE-ACETAMINOPHEN 10-325 MG PO TABS: 1.0000 | ORAL_TABLET | Freq: Four times a day (QID) | ORAL | Status: DC | PRN

## 2015-09-14 MED ORDER — CEFTRIAXONE SODIUM 1 G IJ SOLR
1.0000 g | Freq: Once | INTRAMUSCULAR | Status: AC
  Administered 2015-09-14: 1 g via INTRAMUSCULAR

## 2015-09-14 MED ORDER — AMOXICILLIN-POT CLAVULANATE 875-125 MG PO TABS: 1.0000 | ORAL_TABLET | Freq: Two times a day (BID) | ORAL | Status: DC

## 2015-09-14 MED FILL — OXYCODONE-APAP 10-325 TAB: 10-325 | 7 days supply | Qty: 30 | Fill #0

## 2015-09-14 MED FILL — AMOX-CLAV 875-125 MG TABLET: 875-125 | 10 days supply | Qty: 20 | Fill #0

## 2015-09-14 NOTE — Addendum Note (Signed)
Addended by: Aniceto Boss A on: 09/14/2015 12:23 PM   Modules accepted: Orders

## 2015-09-14 NOTE — Progress Notes (Signed)
   Subjective:    Patient ID: [name], female    DOB: [birth year], 30 y.o.   MRN: [id number]  HPI Here for 6 days of stuffy head and a dry cough. Then 3 days ago se developed pain in the right ear. She had a co-worker, Dr. Tresa Endo at the ER, look in the ear and she said it was infected. She started her on Amoxicillin and she has taken 3 doses of this. Yesterday she noticed some bloody fluid oozing out of the ear and the pain simultaneously went away. Now this morning the pain is back.    Review of Systems  Constitutional: Negative.   HENT: Positive for congestion, ear discharge, ear pain, hearing loss, postnasal drip and sinus pressure. Negative for sore throat.   Eyes: Negative.   Respiratory: Positive for cough.        Objective:   Physical Exam  Constitutional: She appears well-developed and well-nourished.  HENT:  Nose: Nose normal.  Mouth/Throat: Oropharynx is clear and moist.  Eyes: Conjunctivae are normal.  Right TM is red and bulging. A small perforation is seen that seems to have closed over again. The left TN is pink without an effusion  Neck: Neck supple. No thyromegaly present.  Pulmonary/Chest: Effort normal and breath sounds normal.  Lymphadenopathy:    She has no cervical adenopathy.          Assessment & Plan:  Otitis media with a spontaneous perforation. Switch to Augmentin for 10 days. Given a Rocephin shot. Use Percocet for pain.  Nelwyn Salisbury, MD

## 2015-09-14 NOTE — Progress Notes (Signed)
Pre visit review using our clinic review tool, if applicable. No additional management support is needed unless otherwise documented below in the visit note. 

## 2015-09-29 ENCOUNTER — Encounter: Payer: Self-pay | Admitting: Family Medicine

## 2015-09-29 ENCOUNTER — Ambulatory Visit (INDEPENDENT_AMBULATORY_CARE_PROVIDER_SITE_OTHER): Payer: 59 | Admitting: Family Medicine

## 2015-09-29 VITALS — BP 98/72 | HR 73 | Temp 98.1°F | Wt 162.0 lb

## 2015-09-29 DIAGNOSIS — J0191 Acute recurrent sinusitis, unspecified: Secondary | ICD-10-CM | POA: Diagnosis not present

## 2015-09-29 MED ORDER — LEVOFLOXACIN 500 MG PO TABS: 500.0000 mg | ORAL_TABLET | Freq: Every day | ORAL | Status: AC

## 2015-09-29 MED FILL — levoFLOXacin 500 MG TABS: 500 | 14 days supply | Qty: 14 | Fill #0

## 2015-09-29 NOTE — Progress Notes (Signed)
   Subjective:    Patient ID: [name], female    DOB: 01/26/1986, 30 y.o.   MRN: [id number]  HPI Here for an apparent recurrence of a sinus infection. She was here 2 weeks ago for a sinusitis and a right otitis media. She took 10 days of Augmentin and she seemed to improve. Her ear pain resolved completely and her sinus pressure improved, although her head never totally cleared. Then 4 days after finishing the antibiotic she again developed pain in the right ear and sinus pressure. No fever.   Review of Systems  Constitutional: Negative.   HENT: Positive for congestion, ear pain, postnasal drip and sinus pressure. Negative for sore throat.   Eyes: Negative.   Respiratory: Negative.        Objective:   Physical Exam  Constitutional: She appears well-developed and well-nourished.  HENT:  Right Ear: External ear normal.  Left Ear: External ear normal.  Nose: Nose normal.  Mouth/Throat: Oropharynx is clear and moist.  Eyes: Conjunctivae are normal.  Neck: No thyromegaly present.  Pulmonary/Chest: Effort normal and breath sounds normal.  Lymphadenopathy:    She has no cervical adenopathy.          Assessment & Plan:  Partially treated sinusitis. Treat with 14 days of Levaquin.  Nelwyn Salisbury, MD

## 2015-09-29 NOTE — Progress Notes (Signed)
Pre visit review using our clinic review tool, if applicable. No additional management support is needed unless otherwise documented below in the visit note. 

## 2015-10-01 MED FILL — BUPROPION HCL XL 300 MG TAB: 300 | 30 days supply | Qty: 30 | Fill #2

## 2015-10-08 ENCOUNTER — Encounter (HOSPITAL_COMMUNITY): Payer: Self-pay | Admitting: Psychiatry

## 2015-10-08 ENCOUNTER — Ambulatory Visit (INDEPENDENT_AMBULATORY_CARE_PROVIDER_SITE_OTHER): Payer: 59 | Admitting: Psychiatry

## 2015-10-08 VITALS — BP 110/62 | HR 75 | Ht 60.75 in | Wt 161.6 lb

## 2015-10-08 DIAGNOSIS — F411 Generalized anxiety disorder: Secondary | ICD-10-CM | POA: Diagnosis not present

## 2015-10-08 DIAGNOSIS — F331 Major depressive disorder, recurrent, moderate: Secondary | ICD-10-CM | POA: Diagnosis not present

## 2015-10-08 MED ORDER — SERTRALINE HCL 100 MG PO TABS: 150.0000 mg | ORAL_TABLET | Freq: Every day | ORAL | Status: DC

## 2015-10-08 MED ORDER — BUPROPION HCL ER (XL) 300 MG PO TB24: 300.0000 mg | ORAL_TABLET | Freq: Every day | ORAL | Status: DC

## 2015-10-08 NOTE — Progress Notes (Signed)
Patient ID: [name], female   DOB: 04-28-1985, 30 y.o.   MRN: [id number] Patient ID: [name], female   DOB: 1986/04/16, 30 y.o.   MRN: [id number]  [facility] [address] Progress Note  [name] [id number] 30 y.o.  10/08/2015 3:33 PM  Chief Complaint: "pretty good"  History of Present Illness: [name] husband has graduated from nursing school. Pt is relieved.  Pt is social and is involved in a mommy and me group. Pt is working out regularly.  Reports she has palpations and SOB with any physical activity.   Depression is doing well. Reports ongoing low motivation but it is slowly improving. Denies anhedonia, isolation, crying spells, poor hygiene, worthlessness and hopelessness.   Sleeping fairly and getting about 7 hrs. It is harder for her to fall asleep.  Appetite is good. Concentration is so/so. Energy is good and pt is working out 3x/week.   Anxiety is up an down. It spikes when husband is not around, in social situaitons and work.   Pt no longer pulls her eyelashes out.  States Wellbutrin and Zoloft have helped a lot. She is taking them as prescribed. Libido is decreased so she would like to decrease Zoloft.   Suicidal Ideation: No Plan Formed: No Patient has means to carry out plan: No  Homicidal Ideation: No Plan Formed: No Patient has means to carry out plan: No  Review of Systems: Psychiatric: Agitation: No Hallucination: No Depressed Mood: No Insomnia: No Hypersomnia: No Altered Concentration: Yes Feels Worthless: No Grandiose Ideas: No Belief In Special Powers: No New/Increased Substance Abuse: No Compulsions: No   Review of Systems  Constitutional: Negative for fever, chills and weight loss.  HENT: Negative for congestion, hearing loss, sore throat and tinnitus.   Eyes: Negative for blurred vision, double vision and redness.  Respiratory: Positive for shortness of breath. Negative for cough and wheezing.        Working with  Pulmonologist  Cardiovascular: Positive for palpitations. Negative for chest pain and leg swelling.  Gastrointestinal: Negative for heartburn, nausea, vomiting and abdominal pain.  Musculoskeletal: Negative for myalgias, back pain, joint pain and neck pain.  Skin: Negative for itching and rash.  Neurological: Positive for headaches. Negative for dizziness, tremors, focal weakness, seizures, loss of consciousness and weakness.  Psychiatric/Behavioral: Negative for depression, suicidal ideas, hallucinations and substance abuse. The patient is nervous/anxious. The patient does not have insomnia.     Past Medical Family, Social History: Pt is married and has 2 kids. 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 Pt drinks wine 3x/week and is a former smoker. She denies drug use. Pt reports family hx of depression and anxiety in her father. Also reports family of depression in her cousin who attempted suicide.   Past Medical History  Diagnosis Date  . Depression     sees Jorje Guild PA at [facility]  . GERD (gastroesophageal reflux disease)   . HPV (human papilloma virus) infection   . Anxiety   . Abnormal Pap smear of cervix   . Gynecological examination     sees Dr. Henderson Cloud  . Tracheomalacia, congenital   . Brachydactyly of fingers   . Rheumatoid arthritis(714.0)     sees Dr. Azzie Roup   . Pregnancy     due August 10, 2014    Outpatient Encounter Prescriptions as of 10/08/2015  Medication Sig  . buPROPion (WELLBUTRIN XL) 300 MG 24 hr tablet Take 1 tablet (300 mg total)  by mouth daily.  Marland Kitchen etonogestrel (NEXPLANON) 68 MG IMPL implant 1 each by Subdermal route once.  Marland Kitchen ibuprofen (ADVIL,MOTRIN) 600 MG tablet Take 1 tablet (600 mg total) by mouth every 6 (six) hours.  Marland Kitchen levofloxacin (LEVAQUIN) 500 MG tablet Take 1 tablet (500 mg total) by mouth daily.  . Prenatal Vit-Fe Fumarate-FA (PRENATAL MULTIVITAMIN) TABS Take 1 tablet by mouth at bedtime.   . sertraline (ZOLOFT) 100 MG  tablet Take 1.5 tablets (150 mg total) by mouth daily.   No facility-administered encounter medications on file as of 10/08/2015.    Past Psychiatric History/Hospitalization(s): Anxiety: Yes Bipolar Disorder: No Depression: Yes Mania: No Psychosis: No Schizophrenia: No Personality Disorder: No Hospitalization for psychiatric illness: No History of Electroconvulsive Shock Therapy: No Prior Suicide Attempts: No  Physical Exam: Constitutional:  BP 110/62 mmHg  Pulse 75  Ht 5' 0.75" (1.543 m)  Wt 161 lb 9.6 oz (73.301 kg)  BMI 30.79 kg/m2  LMP 09/25/2015  General Appearance: alert, oriented, no acute distress and well nourished  Musculoskeletal: Strength & Muscle Tone: within normal limits Gait & Station: normal Patient leans: straight  Mental Status Examination/Evaluation: Objective: Attitude: Calm and cooperative  Appearance: Casual, appears to be stated age  Eye Contact::  Good  Speech:  Clear and Coherent and Normal Rate  Volume:  Normal  Mood:  anxious  Affect:  Congruent   Thought Process:  Intact, Linear and Logical  Orientation:  Full (Time, Place, and Person)  Thought Content:  Negative  Suicidal Thoughts:  No  Homicidal Thoughts:  No  Judgement:  Fair  Insight:  Fair  Concentration: good  Memory: Immediate-fair Recent-fair Remote-fair  Recall: fair  Language: fair  Gait and Station: normal  [employer] of Knowledge: average  Psychomotor Activity:  Normal  Akathisia:  No  Handed:  Right  AIMS (if indicated): n/a        Medical Decision Making (Choose Three): Established Problem, Stable/Improving (1), Review of Psycho-Social Stressors (1), Review of Last Therapy Session (1) and Review of Medication Regimen & Side Effects (2)   Assessment: Axis I: GAD, MDD- recurrent, moderate Axis II: deferred     Plan:continue Zoloft 150 mg daily for anxiety and depression.   Continue Wellbutrin XL 300mg  daily for depression -Risks and benefits, side  effects and alternatives discussed with patient, pt was given an opportunity to ask questions about medication, illness, and treatment. All current psychiatric medications have been reviewed and discussed with the patient and adjusted as clinically appropriate. The patient has been provided an accurate and updated list of the medications being now prescribed.  Labs- reviewed WNL on 10/20/2014  Pt is in marriage therapy with 10/22/2014.  Therapy: brief supportive therapy provided. Discussed psychosocial stressors in detail. Encouraged to continue exercise.  Pt denies SI and is at an acute low risk for suicide.Patient told to call clinic if any problems occur. Patient advised to go to ER if they should develop SI/HI, side effects, or if symptoms worsen. Has crisis numbers to call if needed. Pt verbalized understanding.  F/up in 3 months or sooner if needed  Jerral Bonito, MD 10/08/2015

## 2015-10-12 ENCOUNTER — Ambulatory Visit (INDEPENDENT_AMBULATORY_CARE_PROVIDER_SITE_OTHER): Payer: 59 | Admitting: Adult Health

## 2015-10-12 ENCOUNTER — Encounter: Payer: Self-pay | Admitting: Adult Health

## 2015-10-12 VITALS — BP 126/88 | HR 82 | Temp 98.1°F | Ht 60.0 in | Wt 163.0 lb

## 2015-10-12 DIAGNOSIS — R000 Tachycardia, unspecified: Secondary | ICD-10-CM

## 2015-10-12 DIAGNOSIS — R0600 Dyspnea, unspecified: Secondary | ICD-10-CM

## 2015-10-12 NOTE — Progress Notes (Signed)
Chart and office note reviewed in detail  > agree with a/p as outlined    

## 2015-10-12 NOTE — Patient Instructions (Signed)
We are referring to cardiology for tachycardia.  follow up with Dr. Marchelle Gearing As needed

## 2015-10-12 NOTE — Progress Notes (Signed)
Subjective:    Patient ID: [name], female    DOB: 09-22-1985,   MRN: [id number]    Brief patient profile:  28 yowf  ER nurse at [employer]  quit smoking 2009  with tracheomalacia dx as infant but no memory of resp problems as child or young adult then 2014 while IUP cough  X months then recurred sev months later > allergy eval by [facility]inkle neg/prn inhaler never used and followed by allergy thru 03/2014 when pregant and did fine delivery March 2016 then June 2016 onset of sob just with activity assoc with palpitations > eval by Abran Cantor with neg labs > referred to pulmonary clinic 02/23/2015 by Dr Dr Clent Ridges for unexplained sob    History of Present Illness  02/23/2015 1st [facility] office visit/ [facility]   Chief Complaint  Patient presents with  . PULMONARY CONSULT    Pt referred by Dr. Clent Ridges for SOB: pt states in june she started getting SOB with minimal exertion. pt states she had blood work done and everything was normal, and symptoms subsided and now has began again for the past 3 weeks. no c/o wheezing or cough. pt c.o some chest tightness.   onset was acute  in June 2016 was 2 m after started bcps and around 3 m p IUP and gradually improved  Did have leg swelling with iup but resolved both sides s calf pain p IUP Went to [facility] in Sept 2016  before recurrent sob recurred abruptly "just like it did the first time" - ie she has never had shortness of breath at rest or supine but finds that one day she can climb a set of steps fine and the very next day she is very short of breath doing the exact same task. Has the same problem when she walks across a large parking lot assoc each time with sense of chest tightness.  >labs and CTa chest neg > trial gerd rx/ no better   03/31/2015 NP Follow up : Dyspnea Returns for follow up for dyspnea/DOE.  Complains over last 6 months that she developed DOE. This started after delivery of her second child in March this year. Seen by PCP initially with nml  labs  Seen by Dr. Sherene Sires  For consult 02/23/15 .  She was set up for CT chest angio which was neg for PE. W/ no acute proces.  BNP was nml .  She continues to get sob with activity . Feels she can not exercise. Has no chest pain, syncope or dizziness.  Spirometry today is normal with no airflow obstruction or restrictive dz.  FEV1 105%, ratio 80 and FVC 114%.  Walk test in Oct with no desats on room air.  rec Continue on current regimen  We are setting you up for a CPST > no ventilatory limitation ? Circulatory ? > echo done and nl   05/12/2015  f/u ov/Wert re: unexplained sob  Chief Complaint  Patient presents with  . Follow-up    Pt states her breathing is unchanged. No new co's today.    doe x same parking lot was variable now consistent has to stop for a minute when arrives at work  then resumes her day >>no changes   10/12/2015 Follow up : SOB and Tachycardia.  Patient returns for a follow-up visit She Complains of persistent shortness of breath and tachycardia with activity . Over the last year. She has been having symptoms of shortness of breath and tachycardia with exercise.  She is underwent extensive workup. Ambulatory walk test with no desaturations on room air. CT chest was negative for PE or ILD. Spirometry showed normal lung function with no airflow obstruction or restriction. Patient underwent a Susette Racer a pulmonary stress test December 2016 with no evidence of exercise-induced bronchospasm or ventilatory limitations. There was a possible suggestion of a mild circulatory limitation. Patient was set up for a 2-D echo that was normal. Patient was recommended to advance her activity and exercise as tolerated. She says that she's been trying but continues to have episodes of shortness of breath and palpitations. She is concerned that she might have POTS.  We discussed cardiology referral for further evaluation and possible Holter monitoring for her persistent tachycardia. She denies any  chest pain, syncope, fever, rash, edema or calf pain..   Current Medications, Allergies, Complete Past Medical History, Past Surgical History, Family History, and Social History were reviewed in [employer] record.  ROS  The following are not active complaints unless bolded sore throat, dysphagia, dental problems, itching, sneezing,  nasal congestion or excess/ purulent secretions, ear ache,   fever, chills, sweats, unintended wt loss, classically pleuritic or exertional cp, hemoptysis,  orthopnea pnd or leg swelling, presyncope, palpitations, abdominal pain, anorexia, nausea, vomiting, diarrhea  or change in bowel or bladder habits, change in stools or urine, dysuria,hematuria,  rash, arthralgias, visual complaints, headache, numbness, weakness or ataxia or problems with walking or coordination,  change in mood/affect or memory.     Objective:   Physical Exam  amb wf nad / baseline wt 145   Filed Vitals:   10/12/15 1005  BP: 126/88  Pulse: 82  Temp: 98.1 F (36.7 C)  TempSrc: Oral  Height: 5' (1.524 m)  Weight: 163 lb (73.936 kg)  SpO2: 98%      Vital signs reviewed    HEENT: nl dentition, turbinates, and oropharynx. Nl external ear canals without cough reflex   NECK :  without JVD/Nodes/TM/ nl carotid upstrokes bilaterally   LUNGS: no acc muscle use, clear to A and P bilaterally without cough on insp or exp maneuvers   CV:  RRR  no s3 or murmur or increase in P2, no edema   ABD:  soft and nontender with nl excursion in the supine position. No bruits or organomegaly, bowel sounds nl  MS:  warm without deformities, calf tenderness, cyanosis or clubbing  SKIN: warm and dry without lesions    NEURO:  alert, approp, no deficits          Tammy Parrett NP-C  [facility]  10/12/2015

## 2015-10-12 NOTE — Assessment & Plan Note (Signed)
Ongoing dyspnea and tachycardia, question etiology. Patient is underwent an extensive pulmonary workup has been unrevealing thus far. CT chest was negative for PE or interstitial lung disease. No ambulatory desaturations on room air. Spirometry with normal lung function. Cardiopulmonary stress test with no exercise-induced bronchospasm or ventilatory limitations. 2-D echo was normal. Patient does have ongoing tachycardia and shortness of breath. Will refer to cardiology for further evaluation.. She is to follow back in our office as needed. If symptoms persist and cardiac source is ruled out. Patient may need to see Dr. Marchelle Gearing for second opinion.

## 2015-10-25 NOTE — Progress Notes (Signed)
Electrophysiology Office Note   Date:  10/26/2015   ID:  [name], DOB [birth year], MRN [id number]  PCP:  Nelwyn Salisbury, MD Primary Electrophysiologist:  Regan Lemming, MD    Chief Complaint  Patient presents with  . Advice Only  . Tachycardia     History of Present Illness: [name] is a 30 y.o. female who presents today for electrophysiology evaluation.   Presents for tachycardia and shortness of breath.  She says that this is been going on for the last few months. Of note she just had a baby in March of this year. She says that when she is up and moving around, she gets tachycardic with heart rates into the 120s to 130s, associated shortness of breath and chest pain and pressure. This mainly happens when she is up and walking around, it is not happened at rest. She says that it occurs on a monthly basis. She says that she has a month without having symptoms and then the next month she'll have significant symptoms daily. She is currently having symptoms of this every day. She says that when she is at work she notices that her heart rate as high with similar symptoms.   Today, she denies symptoms of palpitations, chest pain, shortness of breath, orthopnea, PND, lower extremity edema, claudication, dizziness, presyncope, syncope, bleeding, or neurologic sequela. The patient is tolerating medications without difficulties and is otherwise without complaint today.    Past Medical History  Diagnosis Date  . Depression     sees Jorje Guild PA at [facility]  . GERD (gastroesophageal reflux disease)   . HPV (human papilloma virus) infection   . Anxiety   . Abnormal Pap smear of cervix   . Gynecological examination     sees Dr. Henderson Cloud  . Tracheomalacia, congenital   . Brachydactyly of fingers   . Rheumatoid arthritis(714.0)     sees Dr. Azzie Roup   . Pregnancy     due August 10, 2014   Past Surgical History  Procedure Laterality Date  . Hand surgery   1996    to elongate the right 3rd metacarpal   . Lumbar fusion  2008    per Dr. Gerlene Fee on L4-5 for spondylolisthesis   . Back surgery       Current Outpatient Prescriptions  Medication Sig Dispense Refill  . buPROPion (WELLBUTRIN XL) 300 MG 24 hr tablet Take 1 tablet (300 mg total) by mouth daily. 90 tablet 0  . etonogestrel (NEXPLANON) 68 MG IMPL implant 1 each by Subdermal route once.    Marland Kitchen ibuprofen (ADVIL,MOTRIN) 600 MG tablet Take 1 tablet (600 mg total) by mouth every 6 (six) hours. 30 tablet 1  . Prenatal Vit-Fe Fumarate-FA (PRENATAL MULTIVITAMIN) TABS Take 1 tablet by mouth at bedtime.     . sertraline (ZOLOFT) 100 MG tablet Take 1.5 tablets (150 mg total) by mouth daily. 135 tablet 0   No current facility-administered medications for this visit.    Allergies:   Nutmeg oil (myristica oil) and Vicodin   Social History:  The patient  reports that she quit smoking about 8 years ago. Her smoking use included Cigarettes. She smoked 0.50 packs per day. She has never used smokeless tobacco. She reports that she drinks alcohol. She reports that she does not use illicit drugs.   Family History:  The patient's family history includes Anxiety disorder in her father; Arthritis in her brother, father, and mother; Cleft lip in her brother; Depression  in her cousin and father; Heart disease in her father; Hyperlipidemia in her father; Hypertension in her father; Mental retardation in her maternal aunt; Psoriasis in her brother; Seizures in her cousin and maternal aunt; Suicidality in her cousin; Uterine cancer in her maternal aunt. There is no history of Colon cancer.    ROS:  Please see the history of present illness.   Otherwise, review of systems is positive for chest pain/pressure, DOE, dizziness, headaches.   All other systems are reviewed and negative.    PHYSICAL EXAM: VS:  BP 108/68 mmHg  Pulse 80  Ht 5\' 1"  (1.549 m)  Wt 164 lb (74.39 kg)  BMI 31.00 kg/m2  LMP 09/25/2015 , BMI  Body mass index is 31 kg/(m^2). GEN: Well nourished, well developed, in no acute distress HEENT: normal Neck: no JVD, carotid bruits, or masses Cardiac: RRR; no murmurs, rubs, or gallops,no edema  Respiratory:  clear to auscultation bilaterally, normal work of breathing GI: soft, nontender, nondistended, + BS MS: no deformity or atrophy Skin: warm and dry Neuro:  Strength and sensation are intact Psych: euthymic mood, full affect  Orthostatic VS for the past 24 hrs:  BP- Lying Pulse- Lying BP- Sitting Pulse- Sitting BP- Standing at 0 minutes Pulse- Standing at 0 minutes  10/26/15 1207 125/78 mmHg 83 120/74 mmHg 79 126/81 mmHg 88    EKG:  EKG is ordered today. The ekg ordered today shows sinus rhythm, rate 80  Recent Labs: 02/23/2015: Pro B Natriuretic peptide (BNP) 17.0    Lipid Panel  No results found for: CHOL, TRIG, HDL, CHOLHDL, VLDL, LDLCALC, LDLDIRECT   Wt Readings from Last 3 Encounters:  10/26/15 164 lb (74.39 kg)  10/12/15 163 lb (73.936 kg)  10/08/15 161 lb 9.6 oz (73.301 kg)      Other studies Reviewed: Additional studies/ records that were reviewed today include: TTE 04/21/16 - Left ventricle: The cavity size was normal. Systolic function was  normal. The estimated ejection fraction was in the range of 60%  to 65%. Wall motion was normal; there were no regional wall  motion abnormalities. Left ventricular diastolic function  parameters were normal. - Mitral valve: There was trivial regurgitation. - Right ventricle: The cavity size was normal. Wall thickness was  normal. Systolic function was normal. - Inferior vena cava: The vessel was normal in size. The  respirophasic diameter changes were in the normal range (>= 50%),  consistent with normal central venous pressure.  CP exercise test 04/08/16 She has evidence of sub-maximal exercise capacity despite adequate effort. This is an ABNORMAL test.  Exercise testing with gas exchange demonstrates  mild functional impairment when compared to matched sedentary norms. There is no evidence of exercise-induced bronchospasm or ventilatory limitations. Low OUES and O2 pulse combined with resulted low PVO2 could suggest mild circulatory limitation (possibly diastolic dysfynction).  ASSESSMENT AND PLAN:  1.  Palpitations: At this point, it is unclear of the cause of her palpitations, shortness of breath, and tachycardia. She has had an echocardiogram which showed normal cardiac function. She also had a stress test which showed mild functional impairment. She is not tachycardic in clinic today and has negative orthostatic vitals. Due to her daily palpitations, we Clarinda Obi have her wear a 48 hour monitor. I have told her to increase her hydration, but she says that she has done this in the past, and has given herself IV fluids to see if this Cael Worth help.    Current medicines are reviewed at length with the  patient today.   The patient does not have concerns regarding her medicines.  The following changes were made today:  none  Labs/ tests ordered today include:  Orders Placed This Encounter  Procedures  . Holter monitor - 48 hour  . EKG 12-Lead     Disposition:   FU with Tavien Chestnut  Post monitor  Signed, Lucynda Rosano Jorja Loa, MD  10/26/2015 1:42 PM     [facility] [address] [place] [address] [phone] (office) [phone] (fax)

## 2015-10-26 ENCOUNTER — Ambulatory Visit (INDEPENDENT_AMBULATORY_CARE_PROVIDER_SITE_OTHER): Payer: 59 | Admitting: Cardiology

## 2015-10-26 VITALS — BP 108/68 | HR 80 | Ht 61.0 in | Wt 164.0 lb

## 2015-10-26 DIAGNOSIS — R002 Palpitations: Secondary | ICD-10-CM | POA: Diagnosis not present

## 2015-10-26 NOTE — Patient Instructions (Signed)
Medication Instructions:  Your physician recommends that you continue on your current medications as directed. Please refer to the Current Medication list given to you today.  Labwork: None ordered  Testing/Procedures: Your physician has recommended that you wear a 48 hour holter monitor. Holter monitors are medical devices that record the heart's electrical activity. Doctors most often use these monitors to diagnose arrhythmias. Arrhythmias are problems with the speed or rhythm of the heartbeat. The monitor is a small, portable device. You can wear one while you do your normal daily activities. This is usually used to diagnose what is causing palpitations/syncope (passing out).  Follow-Up: To be determined once holter monitor results have been reviewed by the physician.  We will call you with these results.  If you need a refill on your cardiac medications before your next appointment, please call your pharmacy.  Thank you for choosing [facility]!!   Dory Horn, RN [phone]  Any Other Special Instructions Will Be Listed Below (If Applicable). Holter Monitoring A Holter monitor is a small device that is used to detect abnormal heart rhythms. It clips to your clothing and is connected by wires to flat, sticky disks (electrodes) that attach to your chest. It is worn continuously for 24-48 hours. HOME CARE INSTRUCTIONS  Wear your Holter monitor at all times, even while exercising and sleeping, for as long as directed by your health care provider.  Make sure that the Holter monitor is safely clipped to your clothing or close to your body as recommended by your health care provider.  Do not get the monitor or wires wet.  Do not put body lotion or moisturizer on your chest.  Keep your skin clean.  Keep a diary of your daily activities, such as walking and doing chores. If you feel that your heartbeat is abnormal or that your heart is fluttering or skipping a beat:  Record  what you are doing when it happens.  Record what time of day the symptoms occur.  Return your Holter monitor as directed by your health care provider.  Keep all follow-up visits as directed by your health care provider. This is important. SEEK IMMEDIATE MEDICAL CARE IF:  You feel lightheaded or you faint.  You have trouble breathing.  You feel pain in your chest, upper arm, or jaw.  You feel sick to your stomach and your skin is pale, cool, or damp.  You heartbeat feels unusual or abnormal.   This information is not intended to replace advice given to you by your health care provider. Make sure you discuss any questions you have with your health care provider.   Document Released: 01/08/2004 Document Revised: 05/02/2014 Document Reviewed: 11/18/2013 Elsevier Interactive Patient Education [employer]

## 2015-10-29 ENCOUNTER — Ambulatory Visit (INDEPENDENT_AMBULATORY_CARE_PROVIDER_SITE_OTHER): Payer: 59

## 2015-10-29 DIAGNOSIS — R002 Palpitations: Secondary | ICD-10-CM | POA: Diagnosis not present

## 2015-10-30 MED FILL — SERTRALINE HCL 100 MG TAB: 100 | 90 days supply | Qty: 135 | Fill #0

## 2015-10-30 MED FILL — BUPROPION HCL XL 300 MG TAB: 300 | 90 days supply | Qty: 90 | Fill #0

## 2015-11-01 ENCOUNTER — Emergency Department (HOSPITAL_COMMUNITY)
Admission: EM | Admit: 2015-11-01 | Discharge: 2015-11-01 | Disposition: A | Discharge status: Home / Self Care | Payer: 59 | Attending: Emergency Medicine | Admitting: Emergency Medicine

## 2015-11-01 ENCOUNTER — Emergency Department (HOSPITAL_COMMUNITY): Payer: 59

## 2015-11-01 ENCOUNTER — Encounter (HOSPITAL_COMMUNITY): Payer: Self-pay

## 2015-11-01 DIAGNOSIS — Z87891 Personal history of nicotine dependence: Secondary | ICD-10-CM | POA: Insufficient documentation

## 2015-11-01 DIAGNOSIS — Z79899 Other long term (current) drug therapy: Secondary | ICD-10-CM | POA: Insufficient documentation

## 2015-11-01 DIAGNOSIS — R202 Paresthesia of skin: Secondary | ICD-10-CM | POA: Insufficient documentation

## 2015-11-01 DIAGNOSIS — R000 Tachycardia, unspecified: Secondary | ICD-10-CM | POA: Insufficient documentation

## 2015-11-01 LAB — I-STAT BETA HCG BLOOD, ED (MC, WL, AP ONLY): I-stat hCG, quantitative: <5 m[IU]/mL (ref <5)

## 2015-11-01 LAB — CBC
HCT: 39.8 % (ref 36.0–46.0)
HEMOGLOBIN: 13.2 g/dL (ref 12.0–15.0)
MCH: 30.5 pg (ref 26.0–34.0)
MCHC: 33.2 g/dL (ref 30.0–36.0)
MCV: 91.9 fL (ref 78.0–100.0)
PLATELETS: 258 10*3/uL (ref 150–400)
RBC: 4.33 MIL/uL (ref 3.87–5.11)
RDW: 12.6 % (ref 11.5–15.5)
WBC: 10.5 10*3/uL (ref 4.0–10.5)

## 2015-11-01 LAB — BASIC METABOLIC PANEL
ANION GAP: 7 (ref 5–15)
BUN: 10 mg/dL (ref 6–20)
CALCIUM: 9.4 mg/dL (ref 8.9–10.3)
CO2: 24 mmol/L (ref 22–32)
Chloride: 108 mmol/L (ref 101–111)
Creatinine, Ser: 0.78 mg/dL (ref 0.44–1.00)
GLUCOSE: 100 mg/dL — AB (ref 65–99)
Potassium: 3.7 mmol/L (ref 3.5–5.1)
Sodium: 139 mmol/L (ref 135–145)

## 2015-11-01 LAB — I-STAT TROPONIN, ED: TROPONIN I, POC: 0 ng/mL (ref 0.00–0.08)

## 2015-11-01 NOTE — ED Notes (Signed)
Patient here with tachycardia intermittently for a while, just took off a 48 hour cardiac monitor that will be sent out for interpretation. About 1 hour pta developed a rapid rate with left arm and facial tingling. Ambulatory to treatment room with steady gait. NAD

## 2015-11-01 NOTE — Discharge Instructions (Signed)
Paresthesia Paresthesia is an abnormal burning or prickling sensation. This sensation is generally felt in the hands, arms, legs, or feet. However, it may occur in any part of the body. Usually, it is not painful. The feeling may be described as:  Tingling or numbness.  Pins and needles.  Skin crawling.  Buzzing.  Limbs falling asleep.  Itching. Most people experience temporary (transient) paresthesia at some time in their lives. Paresthesia may occur when you breathe too quickly (hyperventilation). It can also occur without any apparent cause. Commonly, paresthesia occurs when pressure is placed on a nerve. The sensation quickly goes away after the pressure is removed. For some people, however, paresthesia is a long-lasting (chronic) condition that is caused by an underlying disorder. If you continue to have paresthesia, you may need further medical evaluation. HOME CARE INSTRUCTIONS Watch your condition for any changes. Taking the following actions may help to lessen any discomfort that you are feeling:  Avoid drinking alcohol.  Try acupuncture or massage to help relieve your symptoms.  Keep all follow-up visits as directed by your health care provider. This is important. SEEK MEDICAL CARE IF:  You continue to have episodes of paresthesia.  Your burning or prickling feeling gets worse when you walk.  You have pain, cramps, or dizziness.  You develop a rash. SEEK IMMEDIATE MEDICAL CARE IF:  You feel weak.  You have trouble walking or moving.  You have problems with speech, understanding, or vision.  You feel confused.  You cannot control your bladder or bowel movements.  You have numbness after an injury.  You faint.   This information is not intended to replace advice given to you by your health care provider. Make sure you discuss any questions you have with your health care provider.   Document Released: 04/01/2002 Document Revised: 08/26/2014 Document Reviewed:  04/07/2014 Elsevier Interactive Patient Education 2016 [employer]  

## 2015-11-01 NOTE — ED Notes (Signed)
Pt states she understands instructions. Taking ice chips and tolerating well.

## 2015-11-01 NOTE — ED Provider Notes (Signed)
CSN: [id number]     Arrival date & time 11/01/15  1633 History   First MD Initiated Contact with Patient 11/01/15 1647     Chief Complaint  Patient presents with  . Tachycardia     (Consider location/radiation/quality/duration/timing/severity/associated sxs/prior Treatment) HPI Comments: 30 year old female with past medical history including tachycardia, anxiety/depression, GERD who presents with tingling and heart palpitations. The patient is currently undergoing evaluation for sinus tachycardia associated with shortness of breath that she has had since last year. Workup has included extensive evaluation with pulmonology including PFTs and CTA which was negative for PE or lung disease. She is currently following with a cardiologist and just completed a heart monitor which she will turn in tomorrow. Today while she was at home, she began having heart palpitations associated with chest tightness and shortness of breath. These symptoms are the same symptoms she has had previously, but she began having left arm and left face tingling which she has never had before. The symptoms lasted approximately an hour and a half. Currently, she feels well. She denies any extremity weakness, headache, visual changes, or difficulty walking. She has been well recently with no fevers or recent illness.  The history is provided by the patient.    Past Medical History  Diagnosis Date  . Depression     sees Jorje Guild PA at [facility]  . GERD (gastroesophageal reflux disease)   . HPV (human papilloma virus) infection   . Anxiety   . Abnormal Pap smear of cervix   . Gynecological examination     sees Dr. Henderson Cloud  . Tracheomalacia, congenital   . Brachydactyly of fingers   . Rheumatoid arthritis(714.0)     sees Dr. Azzie Roup   . Pregnancy     due August 10, 2014   Past Surgical History  Procedure Laterality Date  . Hand surgery  1996    to elongate the right 3rd metacarpal   . Lumbar fusion   2008    per Dr. Gerlene Fee on L4-5 for spondylolisthesis   . Back surgery     Family History  Problem Relation Age of Onset  . Arthritis Mother   . Hyperlipidemia Father   . Heart disease Father   . Hypertension Father   . Arthritis Father   . Depression Father   . Anxiety disorder Father   . Arthritis Brother   . Psoriasis Brother   . Cleft lip Brother     without palate  . Uterine cancer Maternal Aunt     x 2  . Seizures Maternal Aunt   . Mental retardation Maternal Aunt     mild  . Colon cancer Neg Hx   . Seizures Cousin   . Depression Cousin     attempted suicide  . Suicidality Cousin    Social History  Substance Use Topics  . Smoking status: Former Smoker -- 0.50 packs/day    Types: Cigarettes    Quit date: 08/11/2007  . Smokeless tobacco: Never Used  . Alcohol Use: 0.0 oz/week    0 Standard drinks or equivalent per week     Comment: occ   OB History    Gravida Para Term Preterm AB TAB SAB Ectopic Multiple Living   2 2 0 2 0 0 0 0 0 2      Review of Systems 10 Systems reviewed and are negative for acute change except as noted in the HPI.    Allergies  Nutmeg oil (myristica oil) and Vicodin  Home  Medications   Prior to Admission medications   Medication Sig Start Date End Date Taking? Authorizing Provider  buPROPion (WELLBUTRIN XL) 300 MG 24 hr tablet Take 1 tablet (300 mg total) by mouth daily. 10/08/15   Oletta Darter, MD  etonogestrel (NEXPLANON) 68 MG IMPL implant 1 each by Subdermal route once.    Historical Provider, MD  ibuprofen (ADVIL,MOTRIN) 600 MG tablet Take 1 tablet (600 mg total) by mouth every 6 (six) hours. 07/07/14   Julio Sicks, NP  Prenatal Vit-Fe Fumarate-FA (PRENATAL MULTIVITAMIN) TABS Take 1 tablet by mouth at bedtime.     Historical Provider, MD  sertraline (ZOLOFT) 100 MG tablet Take 1.5 tablets (150 mg total) by mouth daily. 10/08/15   Oletta Darter, MD   BP 117/79 mmHg  Pulse 100  Temp(Src) 97.4 F (36.3 C) (Axillary)  Resp 18   Ht 5' (1.524 m)  Wt 165 lb (74.844 kg)  BMI 32.22 kg/m2  SpO2 100%  LMP 10/18/2015 Physical Exam  Constitutional: She is oriented to person, place, and time. She appears well-developed and well-nourished. No distress.  Awake, alert  HENT:  Head: Normocephalic and atraumatic.  Eyes: Conjunctivae and EOM are normal. Pupils are equal, round, and reactive to light.  Neck: Neck supple.  Cardiovascular: Regular rhythm and normal heart sounds.   No murmur heard. Borderline tachycardic  Pulmonary/Chest: Effort normal and breath sounds normal. No respiratory distress.  Abdominal: Soft. Bowel sounds are normal. She exhibits no distension.  Musculoskeletal: She exhibits no edema.  Neurological: She is alert and oriented to person, place, and time. She has normal reflexes. No cranial nerve deficit. She exhibits normal muscle tone.  Fluent speech, normal finger-to-nose testing, negative pronator drift, no clonus 5/5 strength and normal sensation x all 4 extremities  Skin: Skin is warm and dry.  Psychiatric: She has a normal mood and affect. Judgment and thought content normal.  Pleasant, calm  Nursing note and vitals reviewed.   ED Course  Procedures (including critical care time) Labs Review Labs Reviewed  BASIC METABOLIC PANEL - Abnormal; Notable for the following:    Glucose, Bld 100 (*)    All other components within normal limits  CBC  I-STAT TROPOININ, ED  I-STAT BETA HCG BLOOD, ED ([facility], [facility], [facility] ONLY)    Imaging Review Dg Chest 2 View  11/01/2015  CLINICAL DATA:  Acute onset of tachycardia and left arm and facial tingling. Fatigue. Initial encounter. EXAM: CHEST  2 VIEW COMPARISON:  Chest radiograph performed 02/23/2015, and CTA of the chest performed 02/24/2015 FINDINGS: The lungs are well-aerated and clear. There is no evidence of focal opacification, pleural effusion or pneumothorax. Bilateral nipple shadows are noted. The heart is normal in size; the mediastinal contour is within  normal limits. No acute osseous abnormalities are seen. IMPRESSION: No acute cardiopulmonary process seen. Electronically Signed   By: Roanna Raider M.D.   On: 11/01/2015 17:22   I have personally reviewed and evaluated these  lab results as part of my medical decision-making.   EKG Interpretation   Date/Time:  Sunday November 01 2015 16:43:57 EDT Ventricular Rate:  93 PR Interval:  126 QRS Duration: 78 QT Interval:  342 QTC Calculation: 425 R Axis:   84 Text Interpretation:  Normal sinus rhythm Normal ECG No significant change  since last tracing Confirmed by LITTLE MD, RACHEL ([id number]) on 11/01/2015  4:48:53 PM      MDM   Final diagnoses:  Paresthesias  Tachycardia   Patient with long history of  sinus tachycardia associated with shortness of breath for which she has been evaluated by pulmonology and is currently being evaluated by cardiology presents with a similar episode, this time involving left arm and left face tingling. She was well-appearing on exam, vital signs notable for mild tachycardia. EKG unchanged from previous. Chest x-ray unremarkable. Normal neurologic exam. Given normal neuro exam and no weakness, I doubt acute intracranial or cervical spine process. I've instructed to follow-up with cardiologist regarding these ongoing symptoms for follow-up of the heart monitor results. Discussed return precautions including any weakness, severe headache, visual changes, or other new neurologic complaints. Patient voiced understanding and was discharged in satisfactory condition.  Laurence Spates, MD 11/01/15 [phone]

## 2015-11-08 ENCOUNTER — Encounter: Payer: Self-pay | Admitting: Emergency Medicine

## 2015-11-08 ENCOUNTER — Emergency Department
Admission: EM | Admit: 2015-11-08 | Discharge: 2015-11-08 | Disposition: A | Discharge status: Home / Self Care | Payer: 59 | Source: Home / Self Care | Attending: Family Medicine | Admitting: Family Medicine

## 2015-11-08 DIAGNOSIS — J020 Streptococcal pharyngitis: Secondary | ICD-10-CM

## 2015-11-08 LAB — POCT RAPID STREP A (OFFICE): Rapid Strep A Screen: POSITIVE — AB

## 2015-11-08 MED ORDER — AMOXICILLIN 500 MG PO CAPS: 500.0000 mg | ORAL_CAPSULE | Freq: Two times a day (BID) | ORAL | Status: DC

## 2015-11-08 NOTE — ED Provider Notes (Signed)
CSN: [id number]     Arrival date & time 11/08/15  1121 History   First MD Initiated Contact with Patient 11/08/15 1221     Chief Complaint  Patient presents with  . Sore Throat   (Consider location/radiation/quality/duration/timing/severity/associated sxs/prior Treatment) HPI AKEIRA LAHM is a 30 y.o. female presenting to UC with c/o sore throat that started last night with a fever, Tmax 100.4*F.  Throat pain is sore, mild to moderate in severity, worse with swallowing. She noticed "white patches" in the back of her throat this morning. She also brought her 57mo old daughter to be seen as she has been fussy and has been exposed to strep throat at daycare.  Pt shared a milkshake with her daughter yesterday. Denies n/v/d.    Past Medical History  Diagnosis Date  . Depression     sees Jorje Guild PA at [facility]  . GERD (gastroesophageal reflux disease)   . HPV (human papilloma virus) infection   . Anxiety   . Abnormal Pap smear of cervix   . Gynecological examination     sees Dr. Henderson Cloud  . Tracheomalacia, congenital   . Brachydactyly of fingers   . Rheumatoid arthritis(714.0)     sees Dr. Azzie Roup   . Pregnancy     due August 10, 2014   Past Surgical History  Procedure Laterality Date  . Hand surgery  1996    to elongate the right 3rd metacarpal   . Lumbar fusion  2008    per Dr. Gerlene Fee on L4-5 for spondylolisthesis   . Back surgery     Family History  Problem Relation Age of Onset  . Arthritis Mother   . Hyperlipidemia Father   . Heart disease Father   . Hypertension Father   . Arthritis Father   . Depression Father   . Anxiety disorder Father   . Arthritis Brother   . Psoriasis Brother   . Cleft lip Brother     without palate  . Uterine cancer Maternal Aunt     x 2  . Seizures Maternal Aunt   . Mental retardation Maternal Aunt     mild  . Colon cancer Neg Hx   . Seizures Cousin   . Depression Cousin     attempted suicide  . Suicidality  Cousin    Social History  Substance Use Topics  . Smoking status: Former Smoker -- 0.50 packs/day    Types: Cigarettes    Quit date: 08/11/2007  . Smokeless tobacco: Never Used  . Alcohol Use: 0.0 oz/week    0 Standard drinks or equivalent per week     Comment: occ   OB History    Gravida Para Term Preterm AB TAB SAB Ectopic Multiple Living   2 2 0 2 0 0 0 0 0 2      Review of Systems  Constitutional: Positive for fever. Negative for chills, appetite change and fatigue.  HENT: Positive for sore throat. Negative for congestion, ear pain, trouble swallowing and voice change.   Respiratory: Negative for cough and shortness of breath.   Gastrointestinal: Negative for nausea, vomiting and diarrhea.    Allergies  Nutmeg oil (myristica oil) and Vicodin  Home Medications   Prior to Admission medications   Medication Sig Start Date End Date Taking? Authorizing Provider  amoxicillin (AMOXIL) 500 MG capsule Take 1 capsule (500 mg total) by mouth 2 (two) times daily. For 10 days 11/08/15   11/10/15, PA-C  buPROPion (WELLBUTRIN XL) 300  MG 24 hr tablet Take 1 tablet (300 mg total) by mouth daily. 10/08/15   Oletta Darter, MD  etonogestrel (NEXPLANON) 68 MG IMPL implant 1 each by Subdermal route once.    Historical Provider, MD  ibuprofen (ADVIL,MOTRIN) 600 MG tablet Take 1 tablet (600 mg total) by mouth every 6 (six) hours. 07/07/14   Julio Sicks, NP  Prenatal Vit-Fe Fumarate-FA (PRENATAL MULTIVITAMIN) TABS Take 1 tablet by mouth at bedtime.     Historical Provider, MD  sertraline (ZOLOFT) 100 MG tablet Take 1.5 tablets (150 mg total) by mouth daily. 10/08/15   Oletta Darter, MD   Meds Ordered and Administered this Visit  Medications - No data to display  BP 106/67 mmHg  Pulse 107  Temp(Src) 98.8 F (37.1 C) (Oral)  Resp 16  Ht 5' 0.75" (1.543 m)  Wt 164 lb 8 oz (74.617 kg)  BMI 31.34 kg/m2  SpO2 97%  LMP 11/07/2015  Breastfeeding? No No data found.   Physical Exam   Constitutional: She appears well-developed and well-nourished. No distress.  HENT:  Head: Normocephalic and atraumatic.  Right Ear: Tympanic membrane normal.  Left Ear: Tympanic membrane normal.  Nose: Nose normal.  Mouth/Throat: Uvula is midline and mucous membranes are normal. Oropharyngeal exudate, posterior oropharyngeal edema and posterior oropharyngeal erythema present. No tonsillar abscesses.  Eyes: Conjunctivae are normal. No scleral icterus.  Neck: Normal range of motion. Neck supple.  Cardiovascular: Normal rate, regular rhythm and normal heart sounds.   Pulmonary/Chest: Effort normal and breath sounds normal. No respiratory distress. She has no wheezes. She has no rales.  Abdominal: Soft. She exhibits no distension. There is no tenderness.  Musculoskeletal: Normal range of motion.  Neurological: She is alert.  Skin: Skin is warm and dry. She is not diaphoretic.  Nursing note and vitals reviewed.   ED Course  Procedures (including critical care time)  Labs Review Labs Reviewed  POCT RAPID STREP A (OFFICE) - Abnormal; Notable for the following:    Rapid Strep A Screen Positive (*)    All other components within normal limits    Imaging Review No results found.   MDM   1. Strep pharyngitis    Pt c/o sore throat and fever, exposure to strep from her daughter who was exposed at daycare.  Exam c/w strep.  Rapid strep: POSITIVE.    Rx: Amoxicillin   May take acetaminophen and ibuprofen for pain. F/u with PCP in 1 week if not improving, sooner if worsening.   Junius Finner, PA-C 11/08/15 1300

## 2015-11-08 NOTE — ED Notes (Signed)
Mother advised that she has a sore throat since yesterday with a fever.

## 2015-11-08 NOTE — Discharge Instructions (Signed)

## 2015-11-09 ENCOUNTER — Ambulatory Visit (INDEPENDENT_AMBULATORY_CARE_PROVIDER_SITE_OTHER): Payer: 59 | Admitting: Family Medicine

## 2015-11-09 VITALS — BP 90/70 | HR 113 | Temp 99.1°F | Ht 60.75 in | Wt 164.0 lb

## 2015-11-09 DIAGNOSIS — J020 Streptococcal pharyngitis: Secondary | ICD-10-CM | POA: Diagnosis not present

## 2015-11-09 MED ORDER — AMOXICILLIN 500 MG PO CAPS: 500.0000 mg | ORAL_CAPSULE | Freq: Three times a day (TID) | ORAL | Status: DC

## 2015-11-09 NOTE — Patient Instructions (Signed)
Increase Amoxicillin to 500 mg three times daily Touch base in 1-2 days if no better.

## 2015-11-09 NOTE — Progress Notes (Signed)
Pre visit review using our clinic review tool, if applicable. No additional management support is needed unless otherwise documented below in the visit note. 

## 2015-11-09 NOTE — Progress Notes (Signed)
Subjective:     Patient ID: [name], female   DOB: 04/21/1986, 30 y.o.   MRN: [id number]  HPI  Acute visit for sore throat.  Onset this past weekend.  She went urgent care yesterday and had positive strep diagnosis.  She was prescribed amoxicillin 500 mg twice daily. She's had a few doses thus     far but does not feel any better. She's had fever, body aches, intermittent    headaches. Denies any nausea or vomiting. No skin rash.  Denies cough or nasal congestion. She has pain with swallowing but no true  difficulties with swallowing  Past Medical History  Diagnosis Date  . Depression     sees Jorje Guild PA at [facility]  . GERD (gastroesophageal reflux disease)   . HPV (human papilloma virus) infection   . Anxiety   . Abnormal Pap smear of cervix   . Gynecological examination     sees Dr. Henderson Cloud  . Tracheomalacia, congenital   . Brachydactyly of fingers   . Rheumatoid arthritis(714.0)     sees Dr. Azzie Roup   . Pregnancy     due August 10, 2014   Past Surgical History  Procedure Laterality Date  . Hand surgery  1996    to elongate the right 3rd metacarpal   . Lumbar fusion  2008    per Dr. Gerlene Fee on L4-5 for spondylolisthesis   . Back surgery      reports that she quit smoking about 8 years ago. Her smoking use included Cigarettes. She smoked 0.50 packs per day. She has never used smokeless tobacco. She reports that she drinks alcohol. She reports that she does not use illicit drugs. family history includes Anxiety disorder in her father; Arthritis in her brother, father, and mother; Cleft lip in her brother; Depression in her cousin and father; Heart disease in her father; Hyperlipidemia in her father; Hypertension in her father; Mental retardation in her maternal aunt; Psoriasis in her brother; Seizures in her cousin and maternal aunt; Suicidality in her cousin; Uterine cancer in her maternal aunt. There is no history of Colon cancer. Allergies  Allergen  Reactions  . Nutmeg Oil (Myristica Oil) Hives  . Vicodin [Hydrocodone-Acetaminophen] Itching     Review of Systems  Constitutional: Positive for fever, chills and fatigue.  HENT: Positive for sore throat. Negative for congestion.   Respiratory: Negative for cough.   Skin: Negative for rash.  Neurological: Positive for headaches.       Objective:   Physical Exam  Constitutional: She appears well-developed and well-nourished.  HENT:  Right Ear: External ear normal.  Left Ear: External ear normal.  Erythema posterior pharynx with whitish exudate bilaterally. No peritonsillar abscess.  Neck: Neck supple.  Cardiovascular: Normal rate and regular rhythm.   Pulmonary/Chest: Effort normal and breath sounds normal. No respiratory distress. She has no wheezes. She has no rales.  Lymphadenopathy:    She has cervical adenopathy.       Assessment:     Strep pharyngitis. Patient does not feel clinically much improved from yesterday. Nontoxic in appearance.    Plan:     -Increase amoxicillin to 500 mg 3 times a day.  -If she is not feeling substantially better in a couple of days consider change to Keflex. - Continue Advil or Aleve for symptomatically relief - stay well-hydrated  Kristian Covey MD [facility]

## 2015-11-12 ENCOUNTER — Telehealth: Payer: Self-pay | Admitting: Cardiology

## 2015-11-12 MED FILL — AMOXICILLIN 500 MG CAPSULE: 500 | 3 days supply | Qty: 10 | Fill #0

## 2015-11-12 NOTE — Telephone Encounter (Signed)
Notes Recorded by Baird Lyons, RN on 11/12/2015 at 3:28 PM Advised patient Dr. Elberta Fortis did not see anything concerning from E.D. Visit. Suggested starting Metoprolol tartrate 25 mg BID for palpitations. Patient tells me that her BPs normally run low, in low 100s.  Will discuss options w/ Camnitz and call her back. She is aware it may be next week before return call w/ advisement.  Notes Recorded by Baird Lyons, RN on 11/12/2015 at 3:18 PM Call Documentation     Kalman Shan at 11/12/2015 3:00 PM   Status: Signed   Expand All Collapse All   Follow-up  The pt is returning Assad Harbeson's call, no other information given     Notes Recorded by Baird Lyons, RN on 11/10/2015 at 4:53 PM [email or web address] Notes Recorded by Henrietta Dine, RN on 11/05/2015 at 5:39 PM Informed patient of results and verbal understanding expressed.  Patient reports one day after removing monitor, she went to the ED for palpitations and SOB.  She requests Dr. Elberta Fortis to review ED notes. She understands she will be called with further recommendations.

## 2015-11-12 NOTE — Telephone Encounter (Signed)
Follow-up      The pt is returning Sherri's call, no other information given

## 2015-11-24 NOTE — Telephone Encounter (Signed)
No signs of tachycardia in ER.  Should continue to monitor.

## 2015-11-24 NOTE — Telephone Encounter (Signed)
Will route back to physician for clarification of medication for palpitations. She is continuing to have palpitations  (Camnitz verbally suggested starting Metoprolol tartrate 25 mg BID for continued palpitations).  Her BPs normally run low, in the low 100s.  Sending back for advisement from physician of alternative med secondary to normal low BPs. She understands I will call her once reviewed by Dr. Elberta Fortis.

## 2015-11-25 MED ORDER — METOPROLOL TARTRATE 25 MG PO TABS: 12.5000 mg | ORAL_TABLET | Freq: Two times a day (BID) | ORAL | 2 refills | Status: DC

## 2015-11-25 MED FILL — METOPROLOL TARTRATE 25 MG T: 25 | 90 days supply | Qty: 90 | Fill #0

## 2015-11-25 NOTE — Telephone Encounter (Signed)
Reviewed w/ Dr. Elberta Fortis -- order to start Metoprolol Tartrate 12.5 mg BID.  Reviewed with patient who is agreeable to plan. Rx sent to [facility] per request She will follow up next month - appt made for 9/6.

## 2015-12-16 ENCOUNTER — Encounter: Payer: Self-pay | Admitting: *Deleted

## 2015-12-29 NOTE — Progress Notes (Signed)
Electrophysiology Office Note   Date:  12/30/2015   ID:  [name], DOB 10-19-1985, MRN [id number]  PCP:  Nelwyn Salisbury, MD Primary Electrophysiologist:  Regan Lemming, MD    Chief Complaint  Patient presents with  . Follow-up    palpitation     History of Present Illness: [name] is a 30 y.o. female who presents today for electrophysiology evaluation.   Presents for tachycardia and shortness of breath.  She says that this is been going on for the last few months. Of note she just had a baby in March of this year. She says that when she is up and moving around, she gets tachycardic with heart rates into the 120s to 130s, associated shortness of breath and chest pain and pressure. She wore a Holter monitor that showed evidence of sinus tachycardia but no obvious arrhythmias. She was put on 12.5 mg of metoprolol, and has felt much better without palpitations or significant shortness of breath.   Today, she denies symptoms of palpitations, chest pain, shortness of breath, orthopnea, PND, lower extremity edema, claudication, dizziness, presyncope, syncope, bleeding, or neurologic sequela. The patient is tolerating medications without difficulties and is otherwise without complaint today.    Past Medical History:  Diagnosis Date  . Abnormal Pap smear of cervix   . Anxiety   . Brachydactyly of fingers   . Depression    sees Jorje Guild PA at [facility]  . GERD (gastroesophageal reflux disease)   . Gynecological examination    sees Dr. Henderson Cloud  . HPV (human papilloma virus) infection   . Pregnancy    due August 10, 2014  . Rheumatoid arthritis(714.0)    sees Dr. Azzie Roup   . Tracheomalacia, congenital    Past Surgical History:  Procedure Laterality Date  . BACK SURGERY    . HAND SURGERY  1996   to elongate the right 3rd metacarpal   . LUMBAR FUSION  2008   per Dr. Gerlene Fee on L4-5 for spondylolisthesis      Current Outpatient Prescriptions    Medication Sig Dispense Refill  . buPROPion (WELLBUTRIN XL) 300 MG 24 hr tablet Take 1 tablet (300 mg total) by mouth daily. 90 tablet 0  . etonogestrel (NEXPLANON) 68 MG IMPL implant 1 each by Subdermal route once.    Marland Kitchen ibuprofen (ADVIL,MOTRIN) 600 MG tablet Take 1 tablet (600 mg total) by mouth every 6 (six) hours. 30 tablet 1  . metoprolol tartrate (LOPRESSOR) 25 MG tablet Take 0.5 tablets (12.5 mg total) by mouth 2 (two) times daily. 90 tablet 2  . Prenatal Vit-Fe Fumarate-FA (PRENATAL MULTIVITAMIN) TABS Take 1 tablet by mouth at bedtime.     . sertraline (ZOLOFT) 100 MG tablet Take 1.5 tablets (150 mg total) by mouth daily. 135 tablet 0   No current facility-administered medications for this visit.     Allergies:   Nutmeg oil (myristica oil) and Vicodin [hydrocodone-acetaminophen]   Social History:  The patient  reports that she quit smoking about 8 years ago. Her smoking use included Cigarettes. She smoked 0.50 packs per day. She has never used smokeless tobacco. She reports that she drinks alcohol. She reports that she does not use drugs.   Family History:  The patient's family history includes Anxiety disorder in her father; Arthritis in her brother, father, and mother; Cleft lip in her brother; Depression in her cousin and father; Heart disease in her father; Hyperlipidemia in her father; Hypertension in her father; Mental  retardation in her maternal aunt; Psoriasis in her brother; Seizures in her cousin and maternal aunt; Suicidality in her cousin; Uterine cancer in her maternal aunt.    ROS:  Please see the history of present illness.   Otherwise, review of systems is positive for none.   All other systems are reviewed and negative.    PHYSICAL EXAM: VS:  BP 100/72   Pulse 64   Ht 5\' 1"  (1.549 m)   Wt 162 lb 3.2 oz (73.6 kg)   BMI 30.65 kg/m  , BMI Body mass index is 30.65 kg/m. GEN: Well nourished, well developed, in no acute distress  HEENT: normal  Neck: no JVD, carotid  bruits, or masses Cardiac: RRR; no murmurs, rubs, or gallops,no edema  Respiratory:  clear to auscultation bilaterally, normal work of breathing GI: soft, nontender, nondistended, + BS MS: no deformity or atrophy  Skin: warm and dry Neuro:  Strength and sensation are intact Psych: euthymic mood, full affect  No data found.   EKG:  EKG is not ordered today.  Recent Labs: 02/23/2015: Pro B Natriuretic peptide (BNP) 17.0 11/01/2015: BUN 10; Creatinine, Ser 0.78; Hemoglobin 13.2; Platelets 258; Potassium 3.7; Sodium 139    Lipid Panel  No results found for: CHOL, TRIG, HDL, CHOLHDL, VLDL, LDLCALC, LDLDIRECT   Wt Readings from Last 3 Encounters:  12/30/15 162 lb 3.2 oz (73.6 kg)  11/09/15 164 lb (74.4 kg)  11/08/15 164 lb 8 oz (74.6 kg)      Other studies Reviewed: Additional studies/ records that were reviewed today include: TTE 04/21/16 - Left ventricle: The cavity size was normal. Systolic function was  normal. The estimated ejection fraction was in the range of 60%  to 65%. Wall motion was normal; there were no regional wall  motion abnormalities. Left ventricular diastolic function  parameters were normal. - Mitral valve: There was trivial regurgitation. - Right ventricle: The cavity size was normal. Wall thickness was  normal. Systolic function was normal. - Inferior vena cava: The vessel was normal in size. The  respirophasic diameter changes were in the normal range (>= 50%),  consistent with normal central venous pressure.  CP exercise test 04/08/16 She has evidence of sub-maximal exercise capacity despite adequate effort. This is an ABNORMAL test.  Exercise testing with gas exchange demonstrates mild functional impairment when compared to matched sedentary norms. There is no evidence of exercise-induced bronchospasm or ventilatory limitations. Low OUES and O2 pulse combined with resulted low PVO2 could suggest mild circulatory limitation (possibly  diastolic dysfynction).  Personal review of the Holter 10/29/15 Average HR 89 Minimum HR 59  Maximum HR 172  Sinus rhythm and sinus tachycardia Symptoms of chest pain and shortness of breath associated with sinus rhythm and sinus tachycardia  ASSESSMENT AND PLAN:  1.  Palpitations: 48 hour monitor without evidence of arrhythmia. Improved after starting metoprolol. We'll continue her current medical regimen, and not make any further changes.  2. Shortness of breath: Improved and without issue.    Current medicines are reviewed at length with the patient today.   The patient does not have concerns regarding her medicines.  The following changes were made today:  none  Labs/ tests ordered today include:  No orders of the defined types were placed in this encounter.    Disposition:   FU with Sahid Borba  PRN  Signed, Aurielle Slingerland 12/30/15, MD  12/30/2015 10:23 AM     [facility] [address] [place] (  [phone] (office) [phone] (fax)

## 2015-12-30 ENCOUNTER — Encounter: Payer: Self-pay | Admitting: Cardiology

## 2015-12-30 ENCOUNTER — Ambulatory Visit (INDEPENDENT_AMBULATORY_CARE_PROVIDER_SITE_OTHER): Payer: 59 | Admitting: Cardiology

## 2015-12-30 VITALS — BP 100/72 | HR 64 | Ht 61.0 in | Wt 162.2 lb

## 2015-12-30 DIAGNOSIS — R002 Palpitations: Secondary | ICD-10-CM | POA: Diagnosis not present

## 2015-12-30 NOTE — Patient Instructions (Signed)
Medication Instructions:  Your physician recommends that you continue on your current medications as directed. Please refer to the Current Medication list given to you today.  * If you need a refill on your cardiac medications before your next appointment, please call your pharmacy.   Labwork: None ordered  Testing/Procedures: None ordered  Follow-Up: No follow up is needed at this time with Dr. Camnitz.  He will see you on an as needed basis.   Thank you for choosing [facility]!!   Delaney Perona, RN [phone]     

## 2016-01-14 ENCOUNTER — Encounter (HOSPITAL_COMMUNITY): Payer: Self-pay | Admitting: Psychiatry

## 2016-01-14 ENCOUNTER — Ambulatory Visit (INDEPENDENT_AMBULATORY_CARE_PROVIDER_SITE_OTHER): Payer: 59 | Admitting: Psychiatry

## 2016-01-14 DIAGNOSIS — F331 Major depressive disorder, recurrent, moderate: Secondary | ICD-10-CM | POA: Diagnosis not present

## 2016-01-14 DIAGNOSIS — F411 Generalized anxiety disorder: Secondary | ICD-10-CM

## 2016-01-14 MED ORDER — BUPROPION HCL ER (XL) 150 MG PO TB24: 450.0000 mg | ORAL_TABLET | Freq: Every day | ORAL | 1 refills | Status: DC

## 2016-01-14 MED ORDER — SERTRALINE HCL 100 MG PO TABS: 150.0000 mg | ORAL_TABLET | Freq: Every day | ORAL | 1 refills | Status: DC

## 2016-01-14 NOTE — Progress Notes (Signed)
Patient ID: [name], female   DOB: February 21, 1986, 30 y.o.   MRN: [id number] Patient ID: [name], female   DOB: 04/30/1985, 30 y.o.   MRN: [id number]  [facility] [address] Progress Note  [name] [id number] 30 y.o.  01/14/2016 10:58 AM  Chief Complaint: "I am good"  History of Present Illness: [name] husband has graduated from nursing school. Pt is relieved and things have improved at home.   Pt is social and is involved in a mommy and me group. Pt is working out regularly.    Depression is doing well but it bothers her about 2 days a week.  On those days she isolates in her house and sleeps a lot. She thinks it is due to being overworked. Reports ongoing low motivation but it is slowly improving. Denies anhedonia, crying spells, poor hygiene, worthlessness and hopelessness.   Sleeping fairly and getting about 7 hrs.  Appetite is good. Concentration is so/so. Energy is good and pt is working out 3x/week.   Anxiety is up an down. It spikes when husband is not around, in social situaitons and work.   Pt no longer pulls her eyelashes out.  States Wellbutrin and Zoloft have helped a lot. She is taking them as prescribed. Libido is decreased so she would like to decrease Zoloft.   Suicidal Ideation: No Plan Formed: No Patient has means to carry out plan: No  Homicidal Ideation: No Plan Formed: No Patient has means to carry out plan: No  Review of Systems: Psychiatric: Agitation: No Hallucination: No Depressed Mood: Yes Insomnia: No Hypersomnia: No Altered Concentration: Yes Feels Worthless: No Grandiose Ideas: No Belief In Special Powers: No New/Increased Substance Abuse: No Compulsions: No   Review of Systems  Constitutional: Negative for chills, fever and weight loss.  HENT: Negative for congestion, hearing loss, sore throat and tinnitus.   Eyes: Negative for blurred vision, double vision and redness.  Respiratory: Negative for cough, shortness  of breath and wheezing.        Working with Pulmonologist  Cardiovascular: Negative for chest pain, palpitations and leg swelling.  Gastrointestinal: Negative for abdominal pain, heartburn, nausea and vomiting.  Musculoskeletal: Negative for back pain, joint pain, myalgias and neck pain.  Skin: Negative for itching and rash.  Neurological: Negative for dizziness, tremors, focal weakness, seizures, loss of consciousness, weakness and headaches.  Psychiatric/Behavioral: Positive for depression. Negative for hallucinations, substance abuse and suicidal ideas. The patient is nervous/anxious. The patient does not have insomnia.     Past Medical Family, Social History: Pt is married and has 2 kids. 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 Pt drinks wine 3x/week and is a former smoker. She denies drug use. Pt reports family hx of depression and anxiety in her father. Also reports family of depression in her cousin who attempted suicide.   Past Medical History:  Diagnosis Date  . Abnormal Pap smear of cervix   . Anxiety   . Brachydactyly of fingers   . Depression    sees Jorje Guild PA at [facility]  . GERD (gastroesophageal reflux disease)   . Gynecological examination    sees Dr. Henderson Cloud  . HPV (human papilloma virus) infection   . Pregnancy    due August 10, 2014  . Rheumatoid arthritis(714.0)    sees Dr. Azzie Roup   . Tracheomalacia, congenital     Outpatient Encounter Prescriptions as of 01/14/2016  Medication Sig  . buPROPion (WELLBUTRIN XL)  300 MG 24 hr tablet Take 1 tablet (300 mg total) by mouth daily.  Marland Kitchen etonogestrel (NEXPLANON) 68 MG IMPL implant 1 each by Subdermal route once.  Marland Kitchen ibuprofen (ADVIL,MOTRIN) 600 MG tablet Take 1 tablet (600 mg total) by mouth every 6 (six) hours.  . metoprolol tartrate (LOPRESSOR) 25 MG tablet Take 0.5 tablets (12.5 mg total) by mouth 2 (two) times daily.  . Prenatal Vit-Fe Fumarate-FA (PRENATAL MULTIVITAMIN) TABS Take 1  tablet by mouth at bedtime.   . sertraline (ZOLOFT) 100 MG tablet Take 1.5 tablets (150 mg total) by mouth daily.   No facility-administered encounter medications on file as of 01/14/2016.     Past Psychiatric History/Hospitalization(s): Anxiety: Yes Bipolar Disorder: No Depression: Yes Mania: No Psychosis: No Schizophrenia: No Personality Disorder: No Hospitalization for psychiatric illness: No History of Electroconvulsive Shock Therapy: No Prior Suicide Attempts: No  Physical Exam: Constitutional:  BP 104/66   Pulse (!) 53   Ht 5' 0.75" (1.543 m)   Wt 164 lb (74.4 kg)   BMI 31.24 kg/m   General Appearance: alert, oriented, no acute distress and well nourished  Musculoskeletal: Strength & Muscle Tone: within normal limits Gait & Station: normal Patient leans: straight  Mental Status Examination/Evaluation: Objective: Attitude: Calm and cooperative  Appearance: Casual, appears to be stated age  Eye Contact::  Good  Speech:  Clear and Coherent and Normal Rate  Volume:  Normal  Mood:  Anxious and depressed  Affect:  Congruent   Thought Process:  Intact, Linear and Logical  Orientation:  Full (Time, Place, and Person)  Thought Content:  Negative  Suicidal Thoughts:  No  Homicidal Thoughts:  No  Judgement:  Fair  Insight:  Fair  Concentration: good  Memory: Immediate-fair Recent-fair Remote-fair  Recall: fair  Language: fair  Gait and Station: normal  [employer] of Knowledge: average  Psychomotor Activity:  Normal  Akathisia:  No  Handed:  Right  AIMS (if indicated): n/a        Assessment: Axis I: GAD, MDD- recurrent, moderate Axis II: deferred     Plan:continue Zoloft 150 mg daily for anxiety and depression.   Increase Wellbutrin XL to 450mg  daily for depression -Risks and benefits, side effects and alternatives discussed with patient, pt was given an opportunity to ask questions about medication, illness, and treatment. All current psychiatric  medications have been reviewed and discussed with the patient and adjusted as clinically appropriate. The patient has been provided an accurate and updated list of the medications being now prescribed.  Labs- reviewed WNL on 11/01/2015  Pt is in marriage therapy with 01/02/2016.  Therapy: brief supportive therapy provided. Discussed psychosocial stressors in detail. Encouraged to continue exercise.  Pt denies SI and is at an acute low risk for suicide.Patient told to call clinic if any problems occur. Patient advised to go to ER if they should develop SI/HI, side effects, or if symptoms worsen. Has crisis numbers to call if needed. Pt verbalized understanding.  F/up in 3 months or sooner if needed  Jerral Bonito, MD 01/14/2016

## 2016-01-18 MED FILL — BUPROPION HCL XL 150 MG TAB: 150 | 90 days supply | Qty: 270 | Fill #0

## 2016-01-22 ENCOUNTER — Ambulatory Visit (INDEPENDENT_AMBULATORY_CARE_PROVIDER_SITE_OTHER): Payer: 59 | Admitting: Family Medicine

## 2016-01-22 VITALS — BP 96/60 | HR 70 | Temp 97.9°F | Ht 60.75 in | Wt 165.0 lb

## 2016-01-22 DIAGNOSIS — R000 Tachycardia, unspecified: Secondary | ICD-10-CM | POA: Diagnosis not present

## 2016-01-22 DIAGNOSIS — R638 Other symptoms and signs concerning food and fluid intake: Secondary | ICD-10-CM

## 2016-01-22 HISTORY — DX: Tachycardia, unspecified: R00.0

## 2016-01-22 LAB — CBC WITH DIFFERENTIAL/PLATELET
BASOS ABS: 0 10*3/uL (ref 0.0–0.1)
Basophils Relative: 0.2 % (ref 0.0–3.0)
EOS ABS: 0.1 10*3/uL (ref 0.0–0.7)
Eosinophils Relative: 1 % (ref 0.0–5.0)
HCT: 40.2 % (ref 36.0–46.0)
Hemoglobin: 13.6 g/dL (ref 12.0–15.0)
LYMPHS ABS: 2.5 10*3/uL (ref 0.7–4.0)
Lymphocytes Relative: 29.6 % (ref 12.0–46.0)
MCHC: 33.8 g/dL (ref 30.0–36.0)
MCV: 89.2 fl (ref 78.0–100.0)
Monocytes Absolute: 0.8 10*3/uL (ref 0.1–1.0)
Monocytes Relative: 9.5 % (ref 3.0–12.0)
NEUTROS ABS: 5 10*3/uL (ref 1.4–7.7)
NEUTROS PCT: 59.7 % (ref 43.0–77.0)
PLATELETS: 249 10*3/uL (ref 150.0–400.0)
RBC: 4.51 Mil/uL (ref 3.87–5.11)
RDW: 13 % (ref 11.5–15.5)
WBC: 8.3 10*3/uL (ref 4.0–10.5)

## 2016-01-22 LAB — BASIC METABOLIC PANEL
BUN: 16 mg/dL (ref 6–23)
CALCIUM: 9.3 mg/dL (ref 8.4–10.5)
CO2: 30 meq/L (ref 19–32)
Chloride: 104 mEq/L (ref 96–112)
Creatinine, Ser: 0.9 mg/dL (ref 0.40–1.20)
GFR: 78.26 mL/min (ref >60.00)
GLUCOSE: 64 mg/dL — AB (ref 70–99)
POTASSIUM: 4.3 meq/L (ref 3.5–5.1)
SODIUM: 141 meq/L (ref 135–145)

## 2016-01-22 LAB — T3, FREE: T3, Free: 3.2 pg/mL (ref 2.3–4.2)

## 2016-01-22 LAB — HEPATIC FUNCTION PANEL
ALK PHOS: 76 U/L (ref 39–117)
ALT: 11 U/L (ref 0–35)
AST: 16 U/L (ref 0–37)
Albumin: 4.3 g/dL (ref 3.5–5.2)
BILIRUBIN DIRECT: 0.1 mg/dL (ref 0.0–0.3)
BILIRUBIN TOTAL: 0.5 mg/dL (ref 0.2–1.2)
TOTAL PROTEIN: 6.9 g/dL (ref 6.0–8.3)

## 2016-01-22 LAB — T4, FREE: FREE T4: 0.9 ng/dL (ref 0.60–1.60)

## 2016-01-22 LAB — TSH: TSH: 1.58 u[IU]/mL (ref 0.35–4.50)

## 2016-01-22 MED ORDER — METOPROLOL SUCCINATE ER 25 MG PO TB24: 25.0000 mg | ORAL_TABLET | Freq: Every day | ORAL | 3 refills | Status: DC

## 2016-01-22 NOTE — Progress Notes (Signed)
   Subjective:    Patient ID: [name], female    DOB: Sep 16, 1985, 30 y.o.   MRN: [id number]  HPI Here with concerns about her weight. She feels fine but she has not been able to lose any weight this year despite watching a strict diet and exercising every day.    Review of Systems  Constitutional: Negative.   Respiratory: Negative.   Cardiovascular: Negative.   Neurological: Negative.        Objective:   Physical Exam  Constitutional: She is oriented to person, place, and time. She appears well-developed and well-nourished.  Neck: No thyromegaly present.  Cardiovascular: Normal rate, regular rhythm, normal heart sounds and intact distal pulses.   Pulmonary/Chest: Effort normal and breath sounds normal.  Musculoskeletal: She exhibits no edema.  Lymphadenopathy:    She has no cervical adenopathy.  Neurological: She is alert and oriented to person, place, and time.          Assessment & Plan:  Weight concerns. Check labs today including a full thyroid panel.  Nelwyn Salisbury, MD

## 2016-01-22 NOTE — Progress Notes (Signed)
Pre visit review using our clinic review tool, if applicable. No additional management support is needed unless otherwise documented below in the visit note. 

## 2016-01-25 ENCOUNTER — Encounter: Payer: Self-pay | Admitting: Family Medicine

## 2016-02-10 MED FILL — SERTRALINE HCL 100 MG TAB: 100 | 90 days supply | Qty: 135 | Fill #0

## 2016-02-26 DIAGNOSIS — F4323 Adjustment disorder with mixed anxiety and depressed mood: Secondary | ICD-10-CM | POA: Diagnosis not present

## 2016-03-05 ENCOUNTER — Emergency Department (INDEPENDENT_AMBULATORY_CARE_PROVIDER_SITE_OTHER)
Admission: EM | Admit: 2016-03-05 | Discharge: 2016-03-05 | Disposition: A | Discharge status: Home / Self Care | Payer: 59 | Source: Home / Self Care | Attending: Family Medicine | Admitting: Family Medicine

## 2016-03-05 ENCOUNTER — Encounter: Payer: Self-pay | Admitting: Emergency Medicine

## 2016-03-05 DIAGNOSIS — J9801 Acute bronchospasm: Secondary | ICD-10-CM

## 2016-03-05 DIAGNOSIS — J40 Bronchitis, not specified as acute or chronic: Secondary | ICD-10-CM | POA: Diagnosis not present

## 2016-03-05 MED ORDER — AZITHROMYCIN 250 MG PO TABS: ORAL_TABLET | ORAL | 0 refills | Status: DC

## 2016-03-05 MED ORDER — PREDNISONE 20 MG PO TABS: ORAL_TABLET | ORAL | 0 refills | Status: DC

## 2016-03-05 MED ORDER — BENZONATATE 200 MG PO CAPS: 200.0000 mg | ORAL_CAPSULE | Freq: Every day | ORAL | 0 refills | Status: DC

## 2016-03-05 NOTE — ED Provider Notes (Signed)
[facility]    CSN: [id number] Arrival date & time: 03/05/16  0905     History   Chief Complaint Chief Complaint  Patient presents with  . URI    HPI [name] is a 30 y.o. female.   Two weeks ago patient developed a cough without associated sore throat or nasal congestion.  She became fatigued, and about a week ago she developed a low grade fever, now resolved.  She has developed wheezing at times, and shortness of breath with activity but no pleuritic pain.  Her cough is productive and worse at night.  She has no history of asthma.  She occasionally coughs until she gags.  Her Tdap is current.   The history is provided by the patient.    Past Medical History:  Diagnosis Date  . Abnormal Pap smear of cervix   . Anxiety   . Brachydactyly of fingers   . Depression    sees Jorje Guild PA at [facility]  . GERD (gastroesophageal reflux disease)   . Gynecological examination    sees Dr. Henderson Cloud  . HPV (human papilloma virus) infection   . Pregnancy    due August 10, 2014  . Rheumatoid arthritis(714.0)    sees Dr. Azzie Roup   . Tracheomalacia, congenital     Patient Active Problem List   Diagnosis Date Noted  . Tachycardia 01/22/2016  . Dyspnea 02/23/2015  . Threatened preterm labor 07/05/2014  . Preterm contractions 07/05/2014  . Vacuum extraction, delivered, current hospitalization 07/05/2014  . Preterm labor 07/03/2014  . Admitted with dehydration 06/03/2014  . Gastroenteritis 06/02/2014  . Major depressive disorder, recurrent episode, moderate (HCC) 03/11/2014  . GAD (generalized anxiety disorder) 03/11/2014  . Rheumatoid arthritis (HCC) 09/13/2013  . Depressive disorder, not elsewhere classified 07/04/2012  . Family history of cleft lip 11/14/2011  . Generalized anxiety disorder 09/20/2011  . NAUSEA AND VOMITING 12/11/2010  . ABDOMINAL PAIN 12/11/2010  . Esophageal reflux 11/17/2010  . Constipation, slow transit 11/17/2010  .  Spondylosis, lumbosacral 11/17/2010  . Anxiety and depression 11/17/2010  . Esophageal dysphagia 11/17/2010  . ANXIETY 09/04/2010    Past Surgical History:  Procedure Laterality Date  . BACK SURGERY    . HAND SURGERY  1996   to elongate the right 3rd metacarpal   . LUMBAR FUSION  2008   per Dr. Gerlene Fee on L4-5 for spondylolisthesis     OB History    Gravida Para Term Preterm AB Living   2 2 0 2 0 2   SAB TAB Ectopic Multiple Live Births   0 0 0 0 2       Home Medications    Prior to Admission medications   Medication Sig Start Date End Date Taking? Authorizing Provider  azithromycin (ZITHROMAX Z-PAK) 250 MG tablet Take 2 tabs today; then begin one tab once daily for 4 more days. 03/05/16   Lattie Haw, MD  benzonatate (TESSALON) 200 MG capsule Take 1 capsule (200 mg total) by mouth at bedtime. Take as needed for cough 03/05/16   Lattie Haw, MD  buPROPion (WELLBUTRIN XL) 150 MG 24 hr tablet Take 3 tablets (450 mg total) by mouth daily. 01/14/16   Oletta Darter, MD  etonogestrel (NEXPLANON) 68 MG IMPL implant 1 each by Subdermal route once.    Historical Provider, MD  predniSONE (DELTASONE) 20 MG tablet Take one tab by mouth twice daily for 5 days, then one daily for 3 days. Take with food.  03/05/16   Lattie Haw, MD  sertraline (ZOLOFT) 100 MG tablet Take 1.5 tablets (150 mg total) by mouth daily. 01/14/16   Oletta Darter, MD    Family History Family History  Problem Relation Age of Onset  . Arthritis Mother   . Hyperlipidemia Father   . Heart disease Father   . Hypertension Father   . Arthritis Father   . Depression Father   . Anxiety disorder Father   . Arthritis Brother   . Psoriasis Brother   . Cleft lip Brother     without palate  . Uterine cancer Maternal Aunt     x 2  . Seizures Maternal Aunt   . Mental retardation Maternal Aunt     mild  . Seizures Cousin   . Depression Cousin     attempted suicide  . Suicidality Cousin   . Colon cancer  Neg Hx     Social History Social History  Substance Use Topics  . Smoking status: Former Smoker    Packs/day: 0.50    Types: Cigarettes    Quit date: 08/11/2007  . Smokeless tobacco: Never Used  . Alcohol use 0.0 oz/week     Comment: occ     Allergies   Nutmeg oil (myristica oil) and Vicodin [hydrocodone-acetaminophen]   Review of Systems Review of Systems No sore throat + cough No pleuritic pain + wheezing No nasal congestion No post-nasal drainage No sinus pain/pressure No itchy/red eyes No earache No hemoptysis + SOB No fever/chills No nausea No vomiting No abdominal pain No diarrhea No urinary symptoms No skin rash + fatigue No myalgias No headache Used OTC meds without relief   Physical Exam Triage Vital Signs ED Triage Vitals  Enc Vitals Group     BP 03/05/16 0930 129/89     Pulse Rate 03/05/16 0930 88     Resp --      Temp 03/05/16 0930 98.3 F (36.8 C)     Temp Source 03/05/16 0930 Oral     SpO2 03/05/16 0930 100 %     Weight 03/05/16 0931 165 lb (74.8 kg)     Height 03/05/16 0931 5' (1.524 m)     Head Circumference --      Peak Flow --      Pain Score --      Pain Loc --      Pain Edu? --      Excl. in GC? --    No data found.   Updated Vital Signs BP 129/89 (BP Location: Left Arm)   Pulse 88   Temp 98.3 F (36.8 C) (Oral)   Ht 5' (1.524 m)   Wt 165 lb (74.8 kg)   LMP 03/03/2016 (Exact Date)   SpO2 100%   BMI 32.22 kg/m   Visual Acuity Right Eye Distance:   Left Eye Distance:   Bilateral Distance:    Right Eye Near:   Left Eye Near:    Bilateral Near:     Physical Exam Nursing notes and Vital Signs reviewed. Appearance:  Patient appears stated age, and in no acute distress Eyes:  Pupils are equal, round, and reactive to light and accomodation.  Extraocular movement is intact.  Conjunctivae are not inflamed  Ears:  Canals normal.  Tympanic membranes normal.  Nose:  Mildly congested turbinates.  No sinus tenderness.     Pharynx:  Normal Neck:  Supple.  Tender enlarged posterior/lateral nodes are palpated bilaterally  Lungs:  Mild diffuse expiratory wheezes, somewhat  more prominent on the right. Breath sounds are equal.  Moving air well. Heart:  Regular rate and rhythm without murmurs, rubs, or gallops.  Abdomen:  Nontender without masses or hepatosplenomegaly.  Bowel sounds are present.  No CVA or flank tenderness.  Extremities:  No edema.  Skin:  No rash present.    UC Treatments / Results  Labs (all labs ordered are listed, but only abnormal results are displayed) Labs Reviewed - No data to display  EKG  EKG Interpretation None       Radiology No results found.  Procedures Procedures (including critical care time)  Medications Ordered in UC Medications - No data to display   Initial Impression / Assessment and Plan / UC Course  I have reviewed the triage vital signs and the nursing notes.  Pertinent labs & imaging results that were available during my care of the patient were reviewed by me and considered in my medical decision making (see chart for details).  Clinical Course   Patient's history and exam are not typical for the viral URI that is circulating in the community. Suspect atypical organism; begin Z-pak for atypical coverage, and prednisone burst/taper for bronchospasm. Prescription written for Benzonatate [facility]) to take at bedtime for night-time cough.  Take plain guaifenesin (1200mg  extended release tabs such as Mucinex) twice daily, with plenty of water, for cough and congestion.  May add Pseudoephedrine (30mg , one or two every 4 to 6 hours) for sinus congestion.  Get adequate rest.   Stop all antihistamines for now, and other non-prescription cough/cold preparations.   Follow-up with family doctor if not improving about 7 to 10 days.      Final Clinical Impressions(s) / UC Diagnoses   Final diagnoses:  Bronchitis  Bronchospasm    New Prescriptions New  Prescriptions   AZITHROMYCIN (ZITHROMAX Z-PAK) 250 MG TABLET    Take 2 tabs today; then begin one tab once daily for 4 more days.   BENZONATATE (TESSALON) 200 MG CAPSULE    Take 1 capsule (200 mg total) by mouth at bedtime. Take as needed for cough   PREDNISONE (DELTASONE) 20 MG TABLET    Take one tab by mouth twice daily for 5 days, then one daily for 3 days. Take with food.     , MD 03/05/16 1001

## 2016-03-05 NOTE — Discharge Instructions (Signed)
Take plain guaifenesin (1200mg  extended release tabs such as Mucinex) twice daily, with plenty of water, for cough and congestion.  May add Pseudoephedrine (30mg , one or two every 4 to 6 hours) for sinus congestion.  Get adequate rest.   Stop all antihistamines for now, and other non-prescription cough/cold preparations.   Follow-up with family doctor if not improving about 7 to 10 days.

## 2016-03-05 NOTE — ED Triage Notes (Signed)
Pt c/o chest congestion x 1 week, productive cough, rattling in chest.

## 2016-03-16 ENCOUNTER — Encounter: Payer: Self-pay | Admitting: *Deleted

## 2016-03-16 ENCOUNTER — Emergency Department
Admission: EM | Admit: 2016-03-16 | Discharge: 2016-03-16 | Disposition: A | Discharge status: Home / Self Care | Payer: 59 | Source: Home / Self Care | Attending: Family Medicine | Admitting: Family Medicine

## 2016-03-16 ENCOUNTER — Ambulatory Visit: Payer: Self-pay | Admitting: Family Medicine

## 2016-03-16 DIAGNOSIS — J181 Lobar pneumonia, unspecified organism: Secondary | ICD-10-CM | POA: Diagnosis not present

## 2016-03-16 DIAGNOSIS — J189 Pneumonia, unspecified organism: Secondary | ICD-10-CM

## 2016-03-16 MED ORDER — AMOXICILLIN-POT CLAVULANATE 875-125 MG PO TABS: 1.0000 | ORAL_TABLET | Freq: Two times a day (BID) | ORAL | 0 refills | Status: DC

## 2016-03-16 NOTE — ED Provider Notes (Signed)
CSN: [id number]     Arrival date & time 03/16/16  1212 History   First MD Initiated Contact with Patient 03/16/16 1231     Chief Complaint  Patient presents with  . Cough   (Consider location/radiation/quality/duration/timing/severity/associated sxs/prior Treatment) HPI  CODIE HAINER is a 30 y.o. female presenting to UC with c/o persistent cough and wheeze. Pt was seen at [facility] on 03/05/16 and treated with Azithromycin and prednisone burst.  She states symptoms did seem to improve, however, she feels like she still has congestion in her Right lower lung.  Denies fever, chills, n/v/d.    Past Medical History:  Diagnosis Date  . Abnormal Pap smear of cervix   . Anxiety   . Brachydactyly of fingers   . Depression    sees Jorje Guild PA at [facility]  . GERD (gastroesophageal reflux disease)   . Gynecological examination    sees Dr. Henderson Cloud  . HPV (human papilloma virus) infection   . Pregnancy    due August 10, 2014  . Rheumatoid arthritis(714.0)    sees Dr. Azzie Roup   . Tracheomalacia, congenital    Past Surgical History:  Procedure Laterality Date  . BACK SURGERY    . HAND SURGERY  1996   to elongate the right 3rd metacarpal   . LUMBAR FUSION  2008   per Dr. Gerlene Fee on L4-5 for spondylolisthesis    Family History  Problem Relation Age of Onset  . Arthritis Mother   . Hyperlipidemia Father   . Heart disease Father   . Hypertension Father   . Arthritis Father   . Depression Father   . Anxiety disorder Father   . Arthritis Brother   . Psoriasis Brother   . Cleft lip Brother     without palate  . Uterine cancer Maternal Aunt     x 2  . Seizures Maternal Aunt   . Mental retardation Maternal Aunt     mild  . Seizures Cousin   . Depression Cousin     attempted suicide  . Suicidality Cousin   . Colon cancer Neg Hx    Social History  Substance Use Topics  . Smoking status: Former Smoker    Packs/day: 0.50    Types: Cigarettes    Quit date:  08/11/2007  . Smokeless tobacco: Never Used  . Alcohol use 0.0 oz/week     Comment: occ   OB History    Gravida Para Term Preterm AB Living   2 2 0 2 0 2   SAB TAB Ectopic Multiple Live Births   0 0 0 0 2     Review of Systems  Constitutional: Negative for chills and fever.  HENT: Positive for congestion. Negative for ear pain, sore throat, trouble swallowing and voice change.   Respiratory: Positive for cough and wheezing. Negative for shortness of breath.   Cardiovascular: Negative for chest pain and palpitations.  Gastrointestinal: Negative for abdominal pain, diarrhea, nausea and vomiting.  Musculoskeletal: Negative for arthralgias, back pain and myalgias.  Skin: Negative for rash.    Allergies  Nutmeg oil (myristica oil) and Vicodin [hydrocodone-acetaminophen]  Home Medications   Prior to Admission medications   Medication Sig Start Date End Date Taking? Authorizing Provider  amoxicillin-clavulanate (AUGMENTIN) 875-125 MG tablet Take 1 tablet by mouth 2 (two) times daily. One po bid x 10 days 03/16/16   Junius Finner, PA-C  benzonatate (TESSALON) 200 MG capsule Take 1 capsule (200 mg total) by mouth at bedtime.  Take as needed for cough 03/05/16   Lattie Haw, MD  buPROPion (WELLBUTRIN XL) 150 MG 24 hr tablet Take 3 tablets (450 mg total) by mouth daily. 01/14/16   Oletta Darter, MD  etonogestrel (NEXPLANON) 68 MG IMPL implant 1 each by Subdermal route once.    Historical Provider, MD  sertraline (ZOLOFT) 100 MG tablet Take 1.5 tablets (150 mg total) by mouth daily. 01/14/16   Oletta Darter, MD   Meds Ordered and Administered this Visit  Medications - No data to display  BP 124/80 (BP Location: Left Arm)   Pulse 97   Temp 98 F (36.7 C) (Oral)   Resp 16   LMP 03/01/2016   SpO2 98%  No data found.   Physical Exam  Constitutional: She appears well-developed and well-nourished. No distress.  HENT:  Head: Normocephalic and atraumatic.  Right Ear: Tympanic  membrane normal.  Left Ear: Tympanic membrane normal.  Nose: Nose normal.  Mouth/Throat: Uvula is midline, oropharynx is clear and moist and mucous membranes are normal.  Eyes: Conjunctivae are normal. No scleral icterus.  Neck: Normal range of motion. Neck supple.  Cardiovascular: Normal rate, regular rhythm and normal heart sounds.   Pulmonary/Chest: Effort normal. No respiratory distress. She has wheezes in the right lower field. She has rhonchi in the right lower field. She has rales in the right lower field. She exhibits no tenderness.  Coarse breath sounds in wheeze localized to Right lower lung field.  No respiratory distress.   Abdominal: Soft. She exhibits no distension. There is no tenderness.  Musculoskeletal: Normal range of motion.  Neurological: She is alert.  Skin: Skin is warm and dry. She is not diaphoretic.  Nursing note and vitals reviewed.   Urgent Care Course   Clinical Course     Procedures (including critical care time)  Labs Review Labs Reviewed - No data to display  Imaging Review No results found.   MDM   1. Community acquired pneumonia of right lower lobe of lung (HCC)    Pt c/o persistent cough despite completing azithromycin.   Coarse breath sounds and wheeze localized in Right lower lung field. Will treat for Right lower lobe pneumonia. Rx: Augmentin F/u with PCP in 1 week if not improving.    Junius Finner, PA-C 03/16/16 1425

## 2016-03-16 NOTE — ED Triage Notes (Signed)
Patient seen 03/05/16, finished zpack and prednisone. Feels somewhat better but still c/o rattling in chest. Afebrile.

## 2016-03-30 ENCOUNTER — Encounter: Payer: Self-pay | Admitting: Family Medicine

## 2016-03-30 ENCOUNTER — Ambulatory Visit (INDEPENDENT_AMBULATORY_CARE_PROVIDER_SITE_OTHER): Payer: 59 | Admitting: Family Medicine

## 2016-03-30 VITALS — BP 114/80 | HR 70 | Resp 12 | Ht 60.0 in | Wt 165.0 lb

## 2016-03-30 DIAGNOSIS — G44209 Tension-type headache, unspecified, not intractable: Secondary | ICD-10-CM

## 2016-03-30 DIAGNOSIS — R51 Headache: Secondary | ICD-10-CM

## 2016-03-30 DIAGNOSIS — R519 Headache, unspecified: Secondary | ICD-10-CM

## 2016-03-30 MED ORDER — BACLOFEN 10 MG PO TABS: 5.0000 mg | ORAL_TABLET | Freq: Three times a day (TID) | ORAL | 0 refills | Status: AC | PRN

## 2016-03-30 MED FILL — BACLOFEN 10 MG TABLET: 10 | 10 days supply | Qty: 30 | Fill #0

## 2016-03-30 NOTE — Patient Instructions (Signed)
  [name] I have seen you today for an acute visit.  1. New onset headache  - MR Brain W Wo Contrast; Future - baclofen (LIORESAL) 10 MG tablet; Take 0.5-1 tablets (5-10 mg total) by mouth 3 (three) times daily as needed for muscle spasms.  Dispense: 30 each; Refill: 0    In general please monitor for signs of worsening symptoms and seek immediate medical attention if any concerning/warning symptom as we discussed.     Please be sure you have an appointment already scheduled with your PCP before you leave today.

## 2016-03-30 NOTE — Progress Notes (Signed)
Pre visit review using our clinic review tool, if applicable. No additional management support is needed unless otherwise documented below in the visit note. 
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
## 2016-04-01 ENCOUNTER — Emergency Department (HOSPITAL_BASED_OUTPATIENT_CLINIC_OR_DEPARTMENT_OTHER)
Admission: EM | Admit: 2016-04-01 | Discharge: 2016-04-01 | Disposition: A | Discharge status: Home / Self Care | Payer: 59 | Attending: Emergency Medicine | Admitting: Emergency Medicine

## 2016-04-01 ENCOUNTER — Emergency Department (HOSPITAL_BASED_OUTPATIENT_CLINIC_OR_DEPARTMENT_OTHER): Payer: 59

## 2016-04-01 ENCOUNTER — Encounter (HOSPITAL_BASED_OUTPATIENT_CLINIC_OR_DEPARTMENT_OTHER): Payer: Self-pay | Admitting: Emergency Medicine

## 2016-04-01 DIAGNOSIS — Z87891 Personal history of nicotine dependence: Secondary | ICD-10-CM | POA: Insufficient documentation

## 2016-04-01 DIAGNOSIS — R51 Headache: Secondary | ICD-10-CM | POA: Insufficient documentation

## 2016-04-01 DIAGNOSIS — R112 Nausea with vomiting, unspecified: Secondary | ICD-10-CM | POA: Diagnosis not present

## 2016-04-01 DIAGNOSIS — R519 Headache, unspecified: Secondary | ICD-10-CM

## 2016-04-01 DIAGNOSIS — Z79899 Other long term (current) drug therapy: Secondary | ICD-10-CM | POA: Insufficient documentation

## 2016-04-01 MED ORDER — ONDANSETRON 8 MG PO TBDP: 8.0000 mg | ORAL_TABLET | Freq: Three times a day (TID) | ORAL | 0 refills | Status: DC | PRN

## 2016-04-01 MED ORDER — KETOROLAC TROMETHAMINE 30 MG/ML IJ SOLN
30.0000 mg | Freq: Once | INTRAMUSCULAR | Status: AC
  Administered 2016-04-01: 30 mg via INTRAVENOUS
  Filled 2016-04-01: qty 1

## 2016-04-01 MED ORDER — PROCHLORPERAZINE EDISYLATE 5 MG/ML IJ SOLN
10.0000 mg | Freq: Once | INTRAMUSCULAR | Status: AC
  Administered 2016-04-01: 10 mg via INTRAVENOUS
  Filled 2016-04-01: qty 2

## 2016-04-01 MED ORDER — DIPHENHYDRAMINE HCL 50 MG/ML IJ SOLN
25.0000 mg | Freq: Once | INTRAMUSCULAR | Status: AC
  Administered 2016-04-01: 25 mg via INTRAVENOUS
  Filled 2016-04-01: qty 1

## 2016-04-01 MED FILL — ONDANSETRON ODT 8 MG TABLET: 8 | 7 days supply | Qty: 20 | Fill #0

## 2016-04-01 NOTE — Discharge Instructions (Signed)
Make sure to get good sleep. take Excedrin Migraine for headache. Take Zofran for nausea and vomiting as needed. Follow-up with your primary care doctor, proceed with MRI as scheduled if headaches continue. Return if worsening symptoms.

## 2016-04-01 NOTE — ED Triage Notes (Signed)
Pt c/o HA x one month, seen at pcp on Wednesday has MRI scheduled Monday but was told if she starts to vomit come get seen.
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
## 2016-04-04 ENCOUNTER — Encounter: Payer: Self-pay | Admitting: Family Medicine

## 2016-04-04 ENCOUNTER — Ambulatory Visit
Admission: RE | Admit: 2016-04-04 | Discharge: 2016-04-04 | Disposition: A | Discharge status: Home / Self Care | Payer: 59 | Source: Ambulatory Visit | Attending: Family Medicine | Admitting: Family Medicine

## 2016-04-04 DIAGNOSIS — R519 Headache, unspecified: Secondary | ICD-10-CM

## 2016-04-04 DIAGNOSIS — R51 Headache: Principal | ICD-10-CM

## 2016-04-04 MED ORDER — GADOBENATE DIMEGLUMINE 529 MG/ML IV SOLN
15.0000 mL | Freq: Once | INTRAVENOUS | Status: AC | PRN
  Administered 2016-04-04: 15 mL via INTRAVENOUS

## 2016-04-07 ENCOUNTER — Other Ambulatory Visit: Payer: Self-pay

## 2016-04-21 ENCOUNTER — Encounter (HOSPITAL_COMMUNITY): Payer: Self-pay | Admitting: Psychiatry

## 2016-04-21 ENCOUNTER — Ambulatory Visit (INDEPENDENT_AMBULATORY_CARE_PROVIDER_SITE_OTHER): Payer: 59 | Admitting: Psychiatry

## 2016-04-21 VITALS — BP 100/68 | HR 78 | Ht 60.75 in | Wt 166.8 lb

## 2016-04-21 DIAGNOSIS — F331 Major depressive disorder, recurrent, moderate: Secondary | ICD-10-CM

## 2016-04-21 DIAGNOSIS — F5105 Insomnia due to other mental disorder: Secondary | ICD-10-CM

## 2016-04-21 DIAGNOSIS — F99 Mental disorder, not otherwise specified: Secondary | ICD-10-CM | POA: Diagnosis not present

## 2016-04-21 DIAGNOSIS — F411 Generalized anxiety disorder: Secondary | ICD-10-CM | POA: Diagnosis not present

## 2016-04-21 MED ORDER — SERTRALINE HCL 100 MG PO TABS: 150.0000 mg | ORAL_TABLET | Freq: Every day | ORAL | 1 refills | Status: DC

## 2016-04-21 MED ORDER — BUSPIRONE HCL 10 MG PO TABS: 10.0000 mg | ORAL_TABLET | Freq: Every day | ORAL | 1 refills | Status: DC

## 2016-04-21 MED ORDER — BUPROPION HCL ER (XL) 150 MG PO TB24: 450.0000 mg | ORAL_TABLET | Freq: Every day | ORAL | 1 refills | Status: DC

## 2016-04-21 MED FILL — BUPROPION HCL XL 150 MG TAB: 150 | 90 days supply | Qty: 270 | Fill #0

## 2016-04-21 MED FILL — busPIRone HCL 10 MG TABS: 10 | 90 days supply | Qty: 90 | Fill #0

## 2016-04-21 MED FILL — SERTRALINE HCL 100 MG TAB: 100 | 90 days supply | Qty: 135 | Fill #0
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
## 2016-04-28 MED FILL — BUPROPION HCL XL 150 MG TAB: 150 | 90 days supply | Qty: 270 | Fill #1

## 2016-05-10 DIAGNOSIS — Z3046 Encounter for surveillance of implantable subdermal contraceptive: Secondary | ICD-10-CM | POA: Diagnosis not present

## 2016-06-10 DIAGNOSIS — H52223 Regular astigmatism, bilateral: Secondary | ICD-10-CM | POA: Diagnosis not present

## 2016-06-10 DIAGNOSIS — H5203 Hypermetropia, bilateral: Secondary | ICD-10-CM | POA: Diagnosis not present

## 2016-07-15 MED FILL — busPIRone HCL 10 MG TABS: 10 | 90 days supply | Qty: 90 | Fill #1

## 2016-07-15 MED FILL — SERTRALINE HCL 100 MG TAB: 100 | 90 days supply | Qty: 135 | Fill #1

## 2016-07-21 ENCOUNTER — Ambulatory Visit (HOSPITAL_COMMUNITY): Payer: Self-pay | Admitting: Psychiatry

## 2016-08-11 ENCOUNTER — Encounter (HOSPITAL_COMMUNITY): Payer: Self-pay | Admitting: Psychiatry

## 2016-08-11 ENCOUNTER — Ambulatory Visit (INDEPENDENT_AMBULATORY_CARE_PROVIDER_SITE_OTHER): Payer: 59 | Admitting: Psychiatry

## 2016-08-11 DIAGNOSIS — Z87891 Personal history of nicotine dependence: Secondary | ICD-10-CM

## 2016-08-11 DIAGNOSIS — Z818 Family history of other mental and behavioral disorders: Secondary | ICD-10-CM

## 2016-08-11 DIAGNOSIS — G4700 Insomnia, unspecified: Secondary | ICD-10-CM

## 2016-08-11 DIAGNOSIS — F331 Major depressive disorder, recurrent, moderate: Secondary | ICD-10-CM

## 2016-08-11 DIAGNOSIS — Z79899 Other long term (current) drug therapy: Secondary | ICD-10-CM

## 2016-08-11 DIAGNOSIS — F411 Generalized anxiety disorder: Secondary | ICD-10-CM

## 2016-08-11 MED ORDER — SERTRALINE HCL 100 MG PO TABS: 150.0000 mg | ORAL_TABLET | Freq: Every day | ORAL | 1 refills | Status: DC

## 2016-08-11 MED ORDER — BUPROPION HCL ER (XL) 150 MG PO TB24: 450.0000 mg | ORAL_TABLET | Freq: Every day | ORAL | 1 refills | Status: DC

## 2016-08-11 NOTE — Progress Notes (Signed)
BH MD/PA/NP OP Progress Note  08/11/2016 3:17 PM DEVANEY SEGERS  MRN:  [id number]  Chief Complaint:  Chief Complaint    Follow-up      HPI: Pt states she is doing ok.   Sleep has improved and she is getting about 7+ hrs/night. She is taking Melatonin each night. Pt never started Buspar. Energy is ok.   States she had her first panic attack in Feb. She was feeling overwhelmed due to all her stressors. Her husband lost his job. States her anxiety is up and down and mostly due to situations.  Depression is ok. She has been focusing on working out and doing things that make her happy. Denies worthlessness and hopelessness. Denies SI/HI.  Taking meds as prescribed and denies SE.    Visit Diagnosis:    ICD-9-CM ICD-10-CM   1. Major depressive disorder, recurrent episode, moderate (HCC) 296.32 F33.1 sertraline (ZOLOFT) 100 MG tablet     buPROPion (WELLBUTRIN XL) 150 MG 24 hr tablet  2. GAD (generalized anxiety disorder) 300.02 F41.1 sertraline (ZOLOFT) 100 MG tablet    Past Psychiatric History: Anxiety: Yes Bipolar Disorder: No Depression: Yes Mania: No Psychosis: No Schizophrenia: No Personality Disorder: No Hospitalization for psychiatric illness: No History of Electroconvulsive Shock Therapy: No Prior Suicide Attempts: No  Past Medical History:  Past Medical History:  Diagnosis Date  . Abnormal Pap smear of cervix   . Anxiety   . Brachydactyly of fingers   . Depression    sees Jorje Guild PA at [facility]  . GERD (gastroesophageal reflux disease)   . Gynecological examination    sees Dr. Henderson Cloud  . HPV (human papilloma virus) infection   . Pregnancy    due August 10, 2014  . Rheumatoid arthritis(714.0)    sees Dr. Azzie Roup   . Tracheomalacia, congenital     Past Surgical History:  Procedure Laterality Date  . BACK SURGERY    . HAND SURGERY  1996   to elongate the right 3rd metacarpal   . LUMBAR FUSION  2008   per Dr. Gerlene Fee on L4-5 for  spondylolisthesis     Family Psychiatric and Medical History:   Family History  Problem Relation Age of Onset  . Arthritis Mother   . Hyperlipidemia Father   . Heart disease Father   . Hypertension Father   . Arthritis Father   . Depression Father   . Anxiety disorder Father   . Arthritis Brother   . Psoriasis Brother   . Cleft lip Brother     without palate  . Uterine cancer Maternal Aunt     x 2  . Seizures Maternal Aunt   . Mental retardation Maternal Aunt     mild  . Seizures Cousin   . Depression Cousin     attempted suicide  . Suicidality Cousin   . Colon cancer Neg Hx     Social History:  Social History   Social History  . Marital status: Married    Spouse name: N/A  . Number of children: 1  . Years of education: N/A   Occupational History  . nurse [employer]   Social History Main Topics  . Smoking status: Former Smoker    Packs/day: 0.50    Types: Cigarettes    Quit date: 08/11/2007  . Smokeless tobacco: Never Used  . Alcohol use 1.8 oz/week    3 Glasses of wine per week     Comment: occ  . Drug use: No  .  Sexual activity: Yes    Partners: Male    Birth control/ protection: None     Comment: had intercourse yesterday   Other Topics Concern  . None   Social History Narrative  . None    Allergies:  Allergies  Allergen Reactions  . Nutmeg Oil (Myristica Oil) Hives  . Vicodin [Hydrocodone-Acetaminophen] Itching    Metabolic Disorder Labs: No results found for: HGBA1C, MPG No results found for: PROLACTIN No results found for: CHOL, TRIG, HDL, CHOLHDL, VLDL, LDLCALC   Current Medications: Current Outpatient Prescriptions  Medication Sig Dispense Refill  . buPROPion (WELLBUTRIN XL) 150 MG 24 hr tablet Take 3 tablets (450 mg total) by mouth daily. 270 tablet 1  . Melatonin 5 MG TABS Take by mouth.    . sertraline (ZOLOFT) 100 MG tablet Take 1.5 tablets (150 mg total) by mouth daily. 135 tablet 1  . busPIRone (BUSPAR) 10 MG tablet Take 1  tablet (10 mg total) by mouth at bedtime. (Patient not taking: Reported on 08/11/2016) 90 tablet 1  . etonogestrel (NEXPLANON) 68 MG IMPL implant 1 each by Subdermal route once.    . ondansetron (ZOFRAN ODT) 8 MG disintegrating tablet Take 1 tablet (8 mg total) by mouth every 8 (eight) hours as needed for nausea or vomiting. (Patient not taking: Reported on 08/11/2016) 20 tablet 0   No current facility-administered medications for this visit.      Musculoskeletal: Strength & Muscle Tone: within normal limits Gait & Station: normal Patient leans: N/A  Psychiatric Specialty Exam: Review of Systems  Gastrointestinal: Negative for heartburn, nausea and vomiting.  Neurological: Negative for dizziness, tingling and headaches.  Psychiatric/Behavioral: Negative for depression, hallucinations, substance abuse and suicidal ideas. The patient is nervous/anxious. The patient does not have insomnia.     Blood pressure 102/68, pulse 78, height 5' 0.75" (1.543 m), weight 158 lb (71.7 kg).Body mass index is 30.1 kg/m.  General Appearance: Casual  Eye Contact:  Good  Speech:  Clear and Coherent and Normal Rate  Volume:  Normal  Mood:  Anxious  Affect:  Appropriate and Full Range  Thought Process:  Goal Directed and Descriptions of Associations: Intact  Orientation:  Full (Time, Place, and Person)  Thought Content: WDL   Suicidal Thoughts:  No  Homicidal Thoughts:  No  Memory:  Immediate;   Good Recent;   Good Remote;   Good  Judgement:  Good  Insight:  Good  Psychomotor Activity:  Normal  Concentration:  Concentration: Good and Attention Span: Good  Recall:  Good  Fund of Knowledge: Good  Language: Good  Akathisia:  No  Handed:  Right  AIMS (if indicated):  n/a  Assets:  Communication Skills Desire for Improvement  ADL's:  Intact  Cognition: WNL  Sleep:  good     Treatment Plan Summary:Medication management  Assessment: GAD; MDD-recurrent, moderate; Insomnia   Medication  management with supportive therapy. Risks/benefits and SE of the medication discussed. Pt verbalized understanding and verbal consent obtained for treatment.  Affirm with the patient that the medications are taken as ordered. Patient expressed understanding of how their medications were to be used.   The risk of un-intended pregnancy is low based on the fact that pt reports her husband had a vasectomy.  Pt is aware that these meds carry a teratogenic risk. Pt will discuss plan of action if she does or plans to become pregnant in the future.   Meds: Zoloft 150mg  po qD for depression and anxiety. Higher doses caused  sexual SE Wellbutrin XL 450mg  po qD for depression D/c Buspar    Labs: none   Therapy: brief supportive therapy provided. Discussed psychosocial stressors in detail.     Consultations:  Pt in MFT with  Pt denies SI and is at an acute low risk for suicide. Patient told to call clinic if any problems occur. Patient advised to go to ER if they should develop SI/HI, side effects, or if symptoms worsen. Has crisis numbers to call if needed. Pt verbalized understanding.  F/up in 4 months or sooner if needed   Jerral Bonito, MD 08/11/2016, 3:17 PM

## 2016-08-29 ENCOUNTER — Ambulatory Visit (INDEPENDENT_AMBULATORY_CARE_PROVIDER_SITE_OTHER): Payer: BLUE CROSS/BLUE SHIELD | Admitting: Family Medicine

## 2016-08-29 ENCOUNTER — Encounter: Payer: Self-pay | Admitting: Family Medicine

## 2016-08-29 VITALS — BP 100/74 | HR 100 | Resp 12 | Ht 60.75 in | Wt 157.2 lb

## 2016-08-29 DIAGNOSIS — M25561 Pain in right knee: Secondary | ICD-10-CM

## 2016-08-29 MED ORDER — KETOROLAC TROMETHAMINE 60 MG/2ML IM SOLN
60.0000 mg | Freq: Once | INTRAMUSCULAR | Status: AC
  Administered 2016-08-29: 60 mg via INTRAMUSCULAR

## 2016-08-29 MED ORDER — CELECOXIB 100 MG PO CAPS: 100.0000 mg | ORAL_CAPSULE | Freq: Two times a day (BID) | ORAL | 0 refills | Status: AC

## 2016-08-29 NOTE — Patient Instructions (Signed)
   [name] I have seen you today for an acute visit.  A few things to remember from today's visit:   Right knee pain, unspecified chronicity - Plan: celecoxib (CELEBREX) 100 MG capsule, Ambulatory referral to Sports Medicine, ketorolac (TORADOL) injection 60 mg    Do not take Celebrex before 8 hours, today Toradol 60 mg IM given.   Medications prescribed today are intended for short period of time and will not be refill upon request, a follow up appointment might be necessary to discuss continuation of of treatment if appropriate.     In general please monitor for signs of worsening symptoms and seek immediate medical attention if any concerning.

## 2016-08-29 NOTE — Progress Notes (Signed)
HPI:   ACUTE VISIT:  Chief Complaint  Patient presents with  . hurt knee    Ms.Kindell RHYTHM GUBBELS is a 31 y.o. female, who is here today complaining of  "a few months" of worsening right knee pain, it is intermittent and started again 4 days ago. She reports Hx of "inflammatory arthritis" for "a while" She denies recent trauma. Pain is mainly on posterior aspect of knee, stabbing, 7/10, exacerbated by prolonged walking/standing, alleviated by rest. She has noted some edema, no erythema.  She took Ibuprofen 600 mg today at 9 am, she has also used Lidocaine patches. Treatment does not help much.  No other joint involvement, fever,chills,or skin rash.   Review of Systems  Constitutional: Negative for appetite change, chills, fatigue and fever.  HENT: Negative for mouth sores and sore throat.   Respiratory: Negative for shortness of breath.   Cardiovascular: Negative for palpitations and leg swelling.  Musculoskeletal: Positive for arthralgias and joint swelling. Negative for back pain.  Skin: Negative for pallor and rash.  Neurological: Negative for weakness and numbness.  Psychiatric/Behavioral: Negative for confusion. The patient is nervous/anxious.       Current Outpatient Prescriptions on File Prior to Visit  Medication Sig Dispense Refill  . buPROPion (WELLBUTRIN XL) 150 MG 24 hr tablet Take 3 tablets (450 mg total) by mouth daily. 270 tablet 1  . Melatonin 5 MG TABS Take by mouth.    . sertraline (ZOLOFT) 100 MG tablet Take 1.5 tablets (150 mg total) by mouth daily. 135 tablet 1   No current facility-administered medications on file prior to visit.      Past Medical History:  Diagnosis Date  . Abnormal Pap smear of cervix   . Anxiety   . Brachydactyly of fingers   . Depression    sees Jorje Guild [facility]  . GERD (gastroesophageal reflux disease)   . Gynecological examination    sees Dr. Henderson Cloud  . HPV (human papilloma virus) infection   .  Pregnancy    due August 10, 2014  . Rheumatoid arthritis(714.0)    sees Dr. Azzie Roup   . Tracheomalacia, congenital    Allergies  Allergen Reactions  . Nutmeg Oil (Myristica Oil) Hives  . Vicodin [Hydrocodone-Acetaminophen] Itching    Social History   Social History  . Marital status: Married    Spouse name: N/A  . Number of children: 1  . Years of education: N/A   Occupational History  . nurse [employer]   Social History Main Topics  . Smoking status: Former Smoker    Packs/day: 0.50    Types: Cigarettes    Quit date: 08/11/2007  . Smokeless tobacco: Never Used  . Alcohol use 1.8 oz/week    3 Glasses of wine per week     Comment: occ  . Drug use: No  . Sexual activity: Yes    Partners: Male    Birth control/ protection: None     Comment: had intercourse yesterday   Other Topics Concern  . None   Social History Narrative  . None    Vitals:   08/29/16 1502  BP: 100/74  Pulse: 100  Resp: 12   Body mass index is 29.96 kg/m.   Physical Exam  Nursing note and vitals reviewed. Constitutional: She is oriented to person, place, and time. She appears well-developed. She does not appear ill. No distress.  HENT:  Head: Atraumatic.  Eyes: Conjunctivae are normal.  Cardiovascular:  Pulses:      Dorsalis pedis pulses are 2+ on the right side.       Posterior tibial pulses are 2+ on the right side.  Respiratory: Effort normal.  Musculoskeletal: She exhibits no edema.  R knee: on inspection no effusion, erythema, or deformities. Valgus and varus stress normal, anterior and posterior drawer test negative. Patellar apprehension test positive. No pain upon palpation of inter articular lines,medial and lateral. No mass or deformity noted on inspection of posterior aspect of knee. Normal ROM, extension > flexion elicits pain. Antalgic gait.  Neurological: She is alert and oriented to person, place, and time. She has normal strength.  Skin: Skin is warm. No  rash noted. No erythema.  Psychiatric: Her speech is normal. Her mood appears anxious.  Well groomed, good eye contact.      ASSESSMENT AND PLAN:     Berdia was seen today for hurt knee.  Diagnoses and all orders for this visit:  Right knee pain, unspecified chronicity -     celecoxib (CELEBREX) 100 MG capsule; Take 1 capsule (100 mg total) by mouth 2 (two) times daily. -     Ambulatory referral to Sports Medicine   Because no Hx of trauma I do not think imaging is needed at this time. ? Patello femoral synd. Here in the office and after verbal consent Toradol 60 mg IM x 1 was given. She can continue Celebrex in 6-8 hours. Some side effects of medications discussed.  Options: PT or referral to sport medicine. Because it seems to be a chronic problem, she would like sport med evaluation.     Anjulie Dipierro G. [place], MD  [facility]. [place] office.

## 2016-08-29 NOTE — Progress Notes (Signed)
Pre visit review using our clinic review tool, if applicable. No additional management support is needed unless otherwise documented below in the visit note. 

## 2016-09-06 ENCOUNTER — Ambulatory Visit: Payer: Self-pay

## 2016-09-06 ENCOUNTER — Ambulatory Visit (INDEPENDENT_AMBULATORY_CARE_PROVIDER_SITE_OTHER): Payer: BLUE CROSS/BLUE SHIELD | Admitting: Sports Medicine

## 2016-09-06 VITALS — BP 118/76 | HR 82 | Ht 60.75 in | Wt 158.6 lb

## 2016-09-06 DIAGNOSIS — M25561 Pain in right knee: Secondary | ICD-10-CM | POA: Diagnosis not present

## 2016-09-06 DIAGNOSIS — M0600 Rheumatoid arthritis without rheumatoid factor, unspecified site: Secondary | ICD-10-CM

## 2016-09-06 HISTORY — DX: Pain in right knee: M25.561

## 2016-09-06 NOTE — Patient Instructions (Signed)
Remember to perform the exercises we discussed for your quadricep and your hip.  If you have any persistent symptoms I am happy to see her back at any point.  I do think that an evaluation by your rheumatologist at some point in the next several months is prudent

## 2016-09-06 NOTE — Progress Notes (Signed)
OFFICE VISIT NOTE [name]. [name] Sports Medicine [facility] at [address] [facility] [phone]  [name] - 31 y.o. female MRN [id number]  Date of birth: 1986/04/23  Visit Date: 09/06/2016  PCP: Nelwyn Salisbury, MD   Referred by: Nelwyn Salisbury, MD  Orlie Dakin, CMA acting as scribe for Dr. Berline Chough.  SUBJECTIVE:   Chief Complaint  Patient presents with  . pain in right knee   HPI: As below and per problem based documentation when appropriate.  Right knee pain, posterior/medial/lateral, some swelling, no erythema Pain started a few months ago but has gotten worse over the past 1-2 weeks No known injury or truama  The pain is described as stabbing and is rated as 6/10.  Worsened with prolonged walking and standing Improves with rest Therapies tried include Ibuprofen, Icy Hot, elevation, Celebrex  Other associated symptoms include: none    Review of Systems  Constitutional: Negative for chills and fever.  Respiratory: Negative for shortness of breath.   Cardiovascular: Positive for leg swelling. Negative for chest pain.  Gastrointestinal: Negative for constipation and diarrhea.  Musculoskeletal: Positive for joint pain. Negative for falls.  Neurological: Negative for dizziness, tingling and headaches.    Otherwise per HPI.  HISTORY & PERTINENT PRIOR DATA:  No specialty comments available. She reports that she quit smoking about 9 years ago. Her smoking use included Cigarettes. She smoked 0.50 packs per day. She has never used smokeless tobacco. No results for input(s): HGBA1C, LABURIC in the last 8760 hours. Medications & Allergies reviewed per EMR Patient Active Problem List   Diagnosis Date Noted  . Right knee pain 09/06/2016  . Tachycardia 01/22/2016  . Dyspnea 02/23/2015  . Threatened preterm labor 07/05/2014  . Preterm contractions 07/05/2014  . Vacuum extraction, delivered, current hospitalization 07/05/2014  . Preterm labor  07/03/2014  . Admitted with dehydration 06/03/2014  . Gastroenteritis 06/02/2014  . Major depressive disorder, recurrent episode, moderate (HCC) 03/11/2014  . GAD (generalized anxiety disorder) 03/11/2014  . Rheumatoid arthritis (HCC) 09/13/2013  . Depressive disorder, not elsewhere classified 07/04/2012  . Family history of cleft lip 11/14/2011  . Generalized anxiety disorder 09/20/2011  . NAUSEA AND VOMITING 12/11/2010  . ABDOMINAL PAIN 12/11/2010  . Esophageal reflux 11/17/2010  . Constipation, slow transit 11/17/2010  . Spondylosis, lumbosacral 11/17/2010  . Anxiety and depression 11/17/2010  . Esophageal dysphagia 11/17/2010  . ANXIETY 09/04/2010   Past Medical History:  Diagnosis Date  . Abnormal Pap smear of cervix   . Anxiety   . Brachydactyly of fingers   . Depression    sees Jorje Guild PA at [facility]  . GERD (gastroesophageal reflux disease)   . Gynecological examination    sees Dr. Henderson Cloud  . HPV (human papilloma virus) infection   . Pregnancy    due August 10, 2014  . Rheumatoid arthritis(714.0)    sees Dr. Azzie Roup   . Tracheomalacia, congenital    Family History  Problem Relation Age of Onset  . Arthritis Mother   . Hyperlipidemia Father   . Heart disease Father   . Hypertension Father   . Arthritis Father   . Depression Father   . Anxiety disorder Father   . Arthritis Brother   . Psoriasis Brother   . Cleft lip Brother        without palate  . Uterine cancer Maternal Aunt        x 2  . Seizures Maternal Aunt   .  Mental retardation Maternal Aunt        mild  . Seizures Cousin   . Depression Cousin        attempted suicide  . Suicidality Cousin   . Colon cancer Neg Hx    Past Surgical History:  Procedure Laterality Date  . BACK SURGERY    . HAND SURGERY  1996   to elongate the right 3rd metacarpal   . LUMBAR FUSION  2008   per Dr. Gerlene Fee on L4-5 for spondylolisthesis    Social History   Occupational History  . nurse [employer]   Social History Main Topics  . Smoking status: Former Smoker    Packs/day: 0.50    Types: Cigarettes    Quit date: 08/11/2007  . Smokeless tobacco: Never Used  . Alcohol use 1.8 oz/week    3 Glasses of wine per week     Comment: occ  . Drug use: No  . Sexual activity: Yes    Partners: Male    Birth control/ protection: None     Comment: had intercourse yesterday    OBJECTIVE:  VS:  HT:5' 0.75" (154.3 cm)   WT:158 lb 9.6 oz (71.9 kg)  BMI:30.3    BP:118/76  HR:82bpm  TEMP: ( )  RESP:99 % EXAM: Findings:  WDWN, NAD, Non-toxic appearing Alert & appropriately interactive Not depressed or anxious appearing No increased work of breathing. Pupils are equal. EOM intact without nystagmus No clubbing or cyanosis of the extremities appreciated Lower extremity sensation is intact light touch.  DP PT pulses 2+/4. Right Knee: Overall joint is well aligned, no significant deformity.   Generalized synovitis without significant effusion.   ROM: 0 to 120.   Extensor mechanism intact No significant medial or lateral joint line tenderness.   3-4 mm opening with bilateral varus and valgus stressing as well as 3-4 mm of anterior laxity with anterior drawer which is once again symmetric bilaterally.   Solid endpoint with Lachman's..   Negative McMurray's and Thessaly.        No results found. ASSESSMENT & PLAN:   Problem List Items Addressed This Visit    Rheumatoid arthritis (HCC)    Recommend re-evaluation with Rheumatology. They were previously discussing using Humira but she does have reservations about starting this.      Relevant Medications   celecoxib (CELEBREX) 200 MG capsule   Right knee pain - Primary    Trace effusion today.  Suspect some underlying component of her inflammatory arthritis is likely contributing to this. I would like to see how she does with the injection as well as with strengthening of her vastus medialis and glute medius as she does have  some weakness and a tensor fascia lata predominance. Given the slight erosive changes and recurrent effusions per history do think reevaluation by rheumatology given her prior elevated inflammatory markers is prudent.  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  +++++++++++++++++++++++++++++++++++++++++++++++++++++++++++++++++++++++++++++ PROCEDURE NOTE -  ULTRASOUND GUIDEDINJECTION: Right Knee Images were obtained and interpreted by myself, Gaspar Bidding, DO  Images have been saved and stored to PACS system. Images obtained on: GE S7 Ultrasound machine  ULTRASOUND FINDINGS:  Patella & Patellar Tendon: Normal Quad & Quad Tendon: Normal Suprapatellar Pouch: Small effusion with slight thickening of synovium Medial Joint Line: Possible small interstitial split of the medial meniscus with small mushroom sign Lateral Joint Line:  Lateral meniscus is slightly swollen without overt tearing.  There are some erosive changes on the lateral femoral condyle Trochlea: Only mild degenerative change with slight thinning of the cartilage.  Slight lateral tilt.  DESCRIPTION OF PROCEDURE:  The patient's clinical condition is marked by substantial pain and/or significant functional disability. Other conservative therapy has not provided relief, is contraindicated, or not appropriate. There is a reasonable likelihood that injection will significantly improve the patient's pain and/or functional impairment. After discussing the risks, benefits and expected outcomes of the injection and all questions were reviewed and answered, the patient wished to undergo the above named procedure. Verbal consent was obtained. The ultrasound was used to identify the target structure and adjacent neurovascular structures. The skin was then prepped in sterile fashion and the target structure was  injected under direct visualization using sterile technique as below: PREP: Alcohol, Ethel Chloride APPROACH: Superiolateral,Single Injection, 21g 2" needle INJECTATE: 2 cc 0.5% marcaine, 2cc 40mg  DepoMedrol ASPIRATE: N/A DRESSING: Band-Aid  Post procedural instructions including recommending icing and warning signs for infection were reviewed. This procedure was well tolerated and there were no complications.   IMPRESSION: Succesful US Guided Injection          Relevant Orders   [place] LIMITED JOINT SPACE STRUCTURES LOW RIGHT(NO LINKED CHARGES)      Follow-up: Return if symptoms worsen or fail to improve.   CMA/ATC served as Neurosurgeon during this visit. History, Physical, and Plan performed by medical provider. Documentation and orders reviewed and attested to.      Gaspar Bidding, DO    [place] [facility] Physician    09/06/2016 10:21 PM

## 2016-09-06 NOTE — Assessment & Plan Note (Signed)
Trace effusion today.  Suspect some underlying component of her inflammatory arthritis is likely contributing to this. I would like to see how she does with the injection as well as with strengthening of her vastus medialis and glute medius as she does have some weakness and a tensor fascia lata predominance. Given the slight erosive changes and recurrent effusions per history do think reevaluation by rheumatology given her prior elevated inflammatory markers is prudent.  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  +++++++++++++++++++++++++++++++++++++++++++++++++++++++++++++++++++++++++++++ PROCEDURE NOTE -  ULTRASOUND GUIDEDINJECTION: Right Knee Images were obtained and interpreted by myself, Gaspar Bidding, DO  Images have been saved and stored to PACS system. Images obtained on: GE S7 Ultrasound machine  ULTRASOUND FINDINGS:  Patella & Patellar Tendon: Normal Quad & Quad Tendon: Normal Suprapatellar Pouch: Small effusion with slight thickening of synovium Medial Joint Line: Possible small interstitial split of the medial meniscus with small mushroom sign Lateral Joint Line: Lateral meniscus is slightly swollen without overt tearing.  There are some erosive changes on the lateral femoral condyle Trochlea: Only mild degenerative change with slight thinning of the cartilage.  Slight lateral tilt.  DESCRIPTION OF PROCEDURE:  The patient's clinical condition is marked by substantial pain and/or significant functional disability. Other conservative therapy has not provided relief, is contraindicated, or not appropriate. There is a reasonable likelihood that injection will significantly improve the patient's pain and/or functional impairment. After discussing the risks, benefits and expected outcomes of the injection and all questions were reviewed and answered,  the patient wished to undergo the above named procedure. Verbal consent was obtained. The ultrasound was used to identify the target structure and adjacent neurovascular structures. The skin was then prepped in sterile fashion and the target structure was injected under direct visualization using sterile technique as below: PREP: Alcohol, Ethel Chloride APPROACH: Superiolateral,Single Injection, 21g 2" needle INJECTATE: 2 cc 0.5% marcaine, 2cc 40mg  DepoMedrol ASPIRATE: N/A DRESSING: Band-Aid  Post procedural instructions including recommending icing and warning signs for infection were reviewed. This procedure was well tolerated and there were no complications.   IMPRESSION: Succesful Guided Injection

## 2016-09-06 NOTE — Assessment & Plan Note (Signed)
Recommend re-evaluation with Rheumatology. They were previously discussing using Humira but she does have reservations about starting this.

## 2016-09-30 ENCOUNTER — Ambulatory Visit (INDEPENDENT_AMBULATORY_CARE_PROVIDER_SITE_OTHER): Payer: BLUE CROSS/BLUE SHIELD | Admitting: Family Medicine

## 2016-09-30 ENCOUNTER — Encounter: Payer: Self-pay | Admitting: Family Medicine

## 2016-09-30 VITALS — BP 118/86 | HR 75 | Temp 98.4°F | Ht 60.75 in | Wt 160.0 lb

## 2016-09-30 DIAGNOSIS — F909 Attention-deficit hyperactivity disorder, unspecified type: Secondary | ICD-10-CM

## 2016-09-30 DIAGNOSIS — F419 Anxiety disorder, unspecified: Secondary | ICD-10-CM

## 2016-09-30 DIAGNOSIS — F900 Attention-deficit hyperactivity disorder, predominantly inattentive type: Secondary | ICD-10-CM

## 2016-09-30 DIAGNOSIS — F329 Major depressive disorder, single episode, unspecified: Secondary | ICD-10-CM

## 2016-09-30 DIAGNOSIS — M069 Rheumatoid arthritis, unspecified: Secondary | ICD-10-CM

## 2016-09-30 DIAGNOSIS — F331 Major depressive disorder, recurrent, moderate: Secondary | ICD-10-CM | POA: Diagnosis not present

## 2016-09-30 HISTORY — DX: Attention-deficit hyperactivity disorder, unspecified type: F90.9

## 2016-09-30 MED ORDER — AMPHETAMINE-DEXTROAMPHET ER 10 MG PO CP24: 10.0000 mg | ORAL_CAPSULE | Freq: Every day | ORAL | 0 refills | Status: DC

## 2016-09-30 MED ORDER — BUPROPION HCL ER (XL) 150 MG PO TB24: 300.0000 mg | ORAL_TABLET | Freq: Every day | ORAL | 1 refills | Status: DC

## 2016-09-30 NOTE — Patient Instructions (Signed)
WE NOW OFFER   [facility] FAST TRACK!!!  SAME DAY Appointments for ACUTE CARE  Such as: Sprains, Injuries, cuts, abrasions, rashes, muscle pain, joint pain, back pain Colds, flu, sore throats, headache, allergies, cough, fever  Ear pain, sinus and eye infections Abdominal pain, nausea, vomiting, diarrhea, upset stomach Animal/insect bites  3 Easy Ways to Schedule: Walk-In Scheduling Call in scheduling Mychart Sign-up: [email or web address]         

## 2016-09-30 NOTE — Progress Notes (Signed)
   Subjective:    Patient ID: [name], female    DOB: 1985-12-07, 31 y.o.   MRN: [id number]  HPI Here for several issues. First she asks my advice about trying Humira for her rheumatoid arthritis. She had seen Dr. Azzie Roup several years ago, but she had reservations about trying a biological agent. Lately she has had more stiffness and pain in her joints and she recently saw Dr. Berline Chough in Sports Medicine. He recommended she reconsider these medications. She is worried about potential infection risks, etc. 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 She took Adderall for ADHD in college and found it very helpful. She wants to try this again for her classes. Her depression and anxiety have been stable.    Review of Systems  Constitutional: Negative.   Respiratory: Negative.   Cardiovascular: Negative.   Musculoskeletal: Positive for arthralgias.  Neurological: Negative.   Psychiatric/Behavioral: Positive for decreased concentration. Negative for agitation and dysphoric mood. The patient is not nervous/anxious.        Objective:   Physical Exam  Constitutional: She is oriented to person, place, and time. She appears well-developed and well-nourished.  Cardiovascular: Normal rate, regular rhythm, normal heart sounds and intact distal pulses.   Pulmonary/Chest: Effort normal and breath sounds normal.  Neurological: She is alert and oriented to person, place, and time.  Psychiatric: She has a normal mood and affect. Her behavior is normal. Thought content normal.          Assessment & Plan:  Her depression and anxiety are stable, and we agreed to decrease her dose of Wellbutrin to 300 mg (2 tabs) a day. For the ADHD she will try Adderall XR 10 mg daily as needed. As for the RA, I encouraged her to pursue a biologic agent and I told her the risks are real but small. We will refer her back to  Rheumatology to discuss treatments.  Gershon Crane, MD

## 2016-10-25 MED FILL — SERTRALINE HCL 100 MG TAB: 100 | 90 days supply | Qty: 135 | Fill #1

## 2016-10-25 MED FILL — BUPROPION HCL XL 150 MG TAB: 150 | 90 days supply | Qty: 270 | Fill #1

## 2016-12-15 ENCOUNTER — Ambulatory Visit (HOSPITAL_COMMUNITY): Payer: Self-pay | Admitting: Psychiatry

## 2017-01-12 ENCOUNTER — Encounter: Payer: Self-pay | Admitting: Family Medicine

## 2017-01-19 ENCOUNTER — Ambulatory Visit (INDEPENDENT_AMBULATORY_CARE_PROVIDER_SITE_OTHER): Payer: BLUE CROSS/BLUE SHIELD | Admitting: Psychiatry

## 2017-01-19 ENCOUNTER — Encounter (HOSPITAL_COMMUNITY): Payer: Self-pay | Admitting: Psychiatry

## 2017-01-19 DIAGNOSIS — F331 Major depressive disorder, recurrent, moderate: Secondary | ICD-10-CM | POA: Diagnosis not present

## 2017-01-19 DIAGNOSIS — G4700 Insomnia, unspecified: Secondary | ICD-10-CM

## 2017-01-19 DIAGNOSIS — F411 Generalized anxiety disorder: Secondary | ICD-10-CM

## 2017-01-19 DIAGNOSIS — Z79899 Other long term (current) drug therapy: Secondary | ICD-10-CM

## 2017-01-19 DIAGNOSIS — Z818 Family history of other mental and behavioral disorders: Secondary | ICD-10-CM

## 2017-01-19 MED ORDER — BUPROPION HCL ER (XL) 150 MG PO TB24: 300.0000 mg | ORAL_TABLET | Freq: Every day | ORAL | 0 refills | Status: DC

## 2017-01-19 MED ORDER — SERTRALINE HCL 100 MG PO TABS: 150.0000 mg | ORAL_TABLET | Freq: Every day | ORAL | 0 refills | Status: DC

## 2017-01-19 NOTE — Progress Notes (Signed)
BH MD/PA/NP OP Progress Note  01/19/2017 9:40 AM [name]  MRN:  [id number]  Chief Complaint:  Chief Complaint    Follow-up     HPI: Pt was last seen August 11, 2016.  Pt states she was diagnosed in the 9th grade with ADHD. She was recently restarted on Adderall by her PCP because she is thinking about going to graduate school in nursing at [employer]. PCP recommended she cut down on Wellbutrin while using Adderall.   Home life is going well. Her husband lost his job and they lost their insurance. Work is good and she has no complaints.   Depression is "doing ok" despite numerous stressors. She has her ups and downs. Pt feels down about 4 days a month. On those days she is sad, unmotivated, low energy, she isolates by staying in bed all day. It usually happens on her days off so she feels better when she is back at work. Sleep is good with Melatonin. Energy is good and she is working out. Pt denies SI/HI.  Since April she has had a few stress induced panic attacks. She states things are better now and so her anxiety is level and manageable.  Taking meds as prescribed and denies SE.   Visit Diagnosis:    ICD-10-CM   1. Major depressive disorder, recurrent episode, moderate (HCC) F33.1   2. GAD (generalized anxiety disorder) F41.1         Past Psychiatric History:  Anxiety:Yes Bipolar Disorder:No Depression:Yes Mania:No Psychosis:No Schizophrenia:No Personality Disorder:No Hospitalization for psychiatric illness:No History of Electroconvulsive Shock Therapy:No Prior Suicide Attempts:No  Past Medical History:  Past Medical History:  Diagnosis Date  . Abnormal Pap smear of cervix   . Anxiety   . Brachydactyly of fingers   . Depression    sees Jorje Guild PA at [facility]  . GERD (gastroesophageal reflux disease)   . Gynecological examination    sees Dr. Henderson Cloud  . HPV (human papilloma virus) infection   . Pregnancy    due August 10, 2014  .  Rheumatoid arthritis(714.0)    sees Dr. Azzie Roup   . Tracheomalacia, congenital     Past Surgical History:  Procedure Laterality Date  . BACK SURGERY    . HAND SURGERY  1996   to elongate the right 3rd metacarpal   . LUMBAR FUSION  2008   per Dr. Gerlene Fee on L4-5 for spondylolisthesis     Family Psychiatric and MEdical History:  Family History  Problem Relation Age of Onset  . Arthritis Mother   . Hyperlipidemia Father   . Heart disease Father   . Hypertension Father   . Arthritis Father   . Depression Father   . Anxiety disorder Father   . Arthritis Brother   . Psoriasis Brother   . Cleft lip Brother        without palate  . Uterine cancer Maternal Aunt        x 2  . Seizures Maternal Aunt   . Mental retardation Maternal Aunt        mild  . Seizures Cousin   . Depression Cousin        attempted suicide  . Suicidality Cousin   . Colon cancer Neg Hx     Social History:  Social History   Social History  . Marital status: Married    Spouse name: N/A  . Number of children: 1  . Years of education: N/A   Occupational History  .  nurse [employer]   Social History Main Topics  . Smoking status: Former Smoker    Packs/day: 0.50    Types: Cigarettes    Quit date: 08/11/2007  . Smokeless tobacco: Never Used  . Alcohol use 1.8 oz/week    3 Glasses of wine per week     Comment: occ  . Drug use: No  . Sexual activity: Yes    Partners: Male    Birth control/ protection: None     Comment: had intercourse yesterday   Other Topics Concern  . None   Social History Narrative  . None    Allergies:  Allergies  Allergen Reactions  . Nutmeg Oil (Myristica Oil) Hives  . Vicodin [Hydrocodone-Acetaminophen] Itching    Metabolic Disorder Labs: No results found for: HGBA1C, MPG No results found for: PROLACTIN No results found for: CHOL, TRIG, HDL, CHOLHDL, VLDL, LDLCALC Lab Results  Component Value Date   TSH 1.58 01/22/2016   TSH 1.53 10/20/2014     Therapeutic Level Labs: No results found for: LITHIUM No results found for: VALPROATE No components found for:  CBMZ  Current Medications: Current Outpatient Prescriptions  Medication Sig Dispense Refill  . Adalimumab (HUMIRA PEN [place]) Inject into the skin.    Marland Kitchen buPROPion (WELLBUTRIN XL) 150 MG 24 hr tablet Take 2 tablets (300 mg total) by mouth daily. 270 tablet 1  . Melatonin 5 MG TABS Take by mouth.    . sertraline (ZOLOFT) 100 MG tablet Take 1.5 tablets (150 mg total) by mouth daily. 135 tablet 1  . amphetamine-dextroamphetamine (ADDERALL XR) 10 MG 24 hr capsule Take 1 capsule (10 mg total) by mouth daily. (Patient not taking: Reported on 01/19/2017) 30 capsule 0   No current facility-administered medications for this visit.      Musculoskeletal: Strength & Muscle Tone: within normal limits Gait & Station: normal Patient leans: N/A  Psychiatric Specialty Exam: Review of Systems  Musculoskeletal: Positive for joint pain. Negative for back pain.  Psychiatric/Behavioral: Positive for depression. Negative for hallucinations, substance abuse and suicidal ideas. The patient is nervous/anxious. The patient does not have insomnia.     Blood pressure 120/66, pulse 74, height 5' 0.75" (1.543 m), weight 156 lb 6.4 oz (70.9 kg).Body mass index is 29.8 kg/m.  General Appearance: Casual  Eye Contact:  Good  Speech:  Clear and Coherent and Normal Rate  Volume:  Normal  Mood:  Euthymic  Affect:  Full Range  Thought Process:  Goal Directed and Descriptions of Associations: Intact  Orientation:  Full (Time, Place, and Person)  Thought Content: Logical   Suicidal Thoughts:  No  Homicidal Thoughts:  No  Memory:  Immediate;   Good Recent;   Good Remote;   Good  Judgement:  Good  Insight:  Good  Psychomotor Activity:  Normal  Concentration:  Concentration: Good and Attention Span: Good  Recall:  Good  Fund of Knowledge: Good  Language: Good  Akathisia:  No  Handed:  Right  AIMS  (if indicated): not done  Assets:  Communication Skills Desire for Improvement Housing Resilience Social Support Talents/Skills Transportation  ADL's:  Intact  Cognition: WNL  Sleep:  Good   Screenings: PHQ2-9     Counselor from 06/20/2014 in BEHAVIORAL HEALTH OUTPATIENT THERAPY [facility]  PHQ-2 Total Score  1       Assessment and Plan: GAD; MDD-recurrent, moderate; Insomnia    Medication management with supportive therapy. Risks/benefits and SE of the medication discussed. Pt verbalized understanding and verbal consent  obtained for treatment.  Affirm with the patient that the medications are taken as ordered. Patient expressed understanding of how their medications were to be used.   Meds: Zoloft 150mg  po qD for depression and anxiety. Higher doses caused sexual SE Wellbutrin XL 300mg  po qD for depression Pt on Adderall as prescribed by PCP   Labs: none  Therapy: brief supportive therapy provided. Discussed psychosocial stressors in detail.     Consultations: none  Pt denies SI and is at an acute low risk for suicide. Patient told to call clinic if any problems occur. Patient advised to go to ER if they should develop SI/HI, side effects, or if symptoms worsen. Has crisis numbers to call if needed. Pt verbalized understanding.  F/up in 3 months or sooner if needed  , MD 01/19/2017, 9:40 AM

## 2017-04-17 ENCOUNTER — Other Ambulatory Visit (HOSPITAL_COMMUNITY): Payer: Self-pay | Admitting: Psychiatry

## 2017-04-17 DIAGNOSIS — F411 Generalized anxiety disorder: Secondary | ICD-10-CM

## 2017-04-17 DIAGNOSIS — F331 Major depressive disorder, recurrent, moderate: Secondary | ICD-10-CM

## 2017-04-20 ENCOUNTER — Ambulatory Visit (INDEPENDENT_AMBULATORY_CARE_PROVIDER_SITE_OTHER): Payer: BLUE CROSS/BLUE SHIELD | Admitting: Psychiatry

## 2017-04-20 ENCOUNTER — Encounter (HOSPITAL_COMMUNITY): Payer: Self-pay | Admitting: Psychiatry

## 2017-04-20 DIAGNOSIS — G4700 Insomnia, unspecified: Secondary | ICD-10-CM | POA: Diagnosis not present

## 2017-04-20 DIAGNOSIS — F411 Generalized anxiety disorder: Secondary | ICD-10-CM | POA: Diagnosis not present

## 2017-04-20 DIAGNOSIS — Z818 Family history of other mental and behavioral disorders: Secondary | ICD-10-CM | POA: Diagnosis not present

## 2017-04-20 DIAGNOSIS — F331 Major depressive disorder, recurrent, moderate: Secondary | ICD-10-CM | POA: Diagnosis not present

## 2017-04-20 DIAGNOSIS — Z87891 Personal history of nicotine dependence: Secondary | ICD-10-CM

## 2017-04-20 MED ORDER — SERTRALINE HCL 100 MG PO TABS: 150.0000 mg | ORAL_TABLET | Freq: Every day | ORAL | 0 refills | Status: DC

## 2017-04-20 MED ORDER — BUPROPION HCL ER (XL) 150 MG PO TB24: 300.0000 mg | ORAL_TABLET | Freq: Every day | ORAL | 0 refills | Status: DC

## 2017-04-20 NOTE — Progress Notes (Signed)
BH MD/PA/NP OP Progress Note  04/20/2017 8:57 AM [name]  MRN:  [id number]  Chief Complaint:  Chief Complaint    Follow-up; Depression     HPI: "they have been going ok". She reports she usually have some decline in mood around this time of the year. She wants to sleep all the time, doesn't want to do anything and is irritable. She says she is coming out of it. She is not as depressed or irritable and is not sleeping as much. She is sleeping well at night. Pt denies SI/HI.   Anxiety "is pretty good". It mostly comes on when she is at work and if she doesn't do things are work.  Pt stopped Adderall several months ago.   Pt states-taking meds as prescribed and denies SE.   Visit Diagnosis:    ICD-10-CM   1. Major depressive disorder, recurrent episode, moderate (HCC) F33.1 buPROPion (WELLBUTRIN XL) 150 MG 24 hr tablet    sertraline (ZOLOFT) 100 MG tablet  2. GAD (generalized anxiety disorder) F41.1 sertraline (ZOLOFT) 100 MG tablet      Past Psychiatric History:  Anxiety: Yes Bipolar Disorder: No Depression: Yes Mania: No Psychosis: No Schizophrenia: No Personality Disorder: No Hospitalization for psychiatric illness: No History of Electroconvulsive Shock Therapy: No Prior Suicide Attempts: No   Past Medical History:  Past Medical History:  Diagnosis Date  . Abnormal Pap smear of cervix   . Anxiety   . Brachydactyly of fingers   . Depression    sees Jorje Guild PA at [facility]  . GERD (gastroesophageal reflux disease)   . Gynecological examination    sees Dr. Henderson Cloud  . HPV (human papilloma virus) infection   . Pregnancy    due August 10, 2014  . Rheumatoid arthritis(714.0)    sees Dr. Azzie Roup   . Tracheomalacia, congenital     Past Surgical History:  Procedure Laterality Date  . BACK SURGERY    . HAND SURGERY  1996   to elongate the right 3rd metacarpal   . LUMBAR FUSION  2008   per Dr. Gerlene Fee on L4-5 for spondylolisthesis      Family Psychiatric History:   Family History  Problem Relation Age of Onset  . Arthritis Mother   . Hyperlipidemia Father   . Heart disease Father   . Hypertension Father   . Arthritis Father   . Depression Father   . Anxiety disorder Father   . Arthritis Brother   . Psoriasis Brother   . Cleft lip Brother        without palate  . Uterine cancer Maternal Aunt        x 2  . Seizures Maternal Aunt   . Mental retardation Maternal Aunt        mild  . Seizures Cousin   . Depression Cousin        attempted suicide  . Suicidality Cousin   . Colon cancer Neg Hx     Social History:  Social History   Socioeconomic History  . Marital status: Married    Spouse name: None  . Number of children: 1  . Years of education: None  . Highest education level: None  Social Needs  . Financial resource strain: None  . Food insecurity - worry: None  . Food insecurity - inability: None  . Transportation needs - medical: None  . Transportation needs - non-medical: None  Occupational History  . Occupation: Academic librarian: [employer]  Tobacco Use  . Smoking status: Former Smoker    Packs/day: 0.50    Types: Cigarettes    Last attempt to quit: 08/11/2007    Years since quitting: 9.6  . Smokeless tobacco: Never Used  Substance and Sexual Activity  . Alcohol use: Yes    Alcohol/week: 1.8 oz    Types: 3 Glasses of wine per week    Comment: occ  . Drug use: No  . Sexual activity: Yes    Partners: Male    Birth control/protection: None    Comment: had intercourse yesterday  Other Topics Concern  . None  Social History Narrative  . None    Allergies:  Allergies  Allergen Reactions  . Nutmeg Oil (Myristica Oil) Hives  . Vicodin [Hydrocodone-Acetaminophen] Itching    Metabolic Disorder Labs: No results found for: HGBA1C, MPG No results found for: PROLACTIN No results found for: CHOL, TRIG, HDL, CHOLHDL, VLDL, LDLCALC Lab Results  Component Value Date   TSH 1.58  01/22/2016   TSH 1.53 10/20/2014    Therapeutic Level Labs: No results found for: LITHIUM No results found for: VALPROATE No components found for:  CBMZ  Current Medications: Current Outpatient Medications  Medication Sig Dispense Refill  . Adalimumab (HUMIRA PEN [place]) Inject into the skin.    Marland Kitchen buPROPion (WELLBUTRIN XL) 150 MG 24 hr tablet Take 2 tablets (300 mg total) by mouth daily. 270 tablet 0  . Melatonin 5 MG TABS Take by mouth.    . sertraline (ZOLOFT) 100 MG tablet Take 1.5 tablets (150 mg total) by mouth daily. 135 tablet 0  . amphetamine-dextroamphetamine (ADDERALL XR) 10 MG 24 hr capsule Take 1 capsule (10 mg total) by mouth daily. (Patient not taking: Reported on 01/19/2017) 30 capsule 0   No current facility-administered medications for this visit.      Musculoskeletal: Strength & Muscle Tone: within normal limits Gait & Station: normal Patient leans: N/A  Psychiatric Specialty Exam: Review of Systems  HENT: Negative for congestion, ear pain, sinus pain and sore throat.   Psychiatric/Behavioral: Positive for depression. Negative for hallucinations, substance abuse and suicidal ideas. The patient does not have insomnia.     Blood pressure 122/74, pulse 81, height 5' 0.25" (1.53 m), weight 159 lb (72.1 kg).Body mass index is 30.8 kg/m.  General Appearance: Casual  Eye Contact:  Good  Speech:  Clear and Coherent and Normal Rate  Volume:  Normal  Mood:  Depressed  Affect:  Full Range  Thought Process:  Goal Directed and Descriptions of Associations: Intact  Orientation:  Full (Time, Place, and Person)  Thought Content: WDL   Suicidal Thoughts:  No  Homicidal Thoughts:  No  Memory:  Immediate;   Good Recent;   Good Remote;   Good  Judgement:  Good  Insight:  Good  Psychomotor Activity:  Normal  Concentration:  Concentration: Good and Attention Span: Good  Recall:  Good  Fund of Knowledge: Good  Language: Good  Akathisia:  No  Handed:  Right  AIMS (if  indicated): not done  Assets:  Communication Skills Desire for Improvement Housing Transportation Vocational/Educational  ADL's:  Intact  Cognition: WNL  Sleep:  Good   Screenings: PHQ2-9     Counselor from 06/20/2014 in BEHAVIORAL HEALTH OUTPATIENT THERAPY [place]  PHQ-2 Total Score  1       Assessment and Plan: GAD; MDD-recurrent, moderate; insomnia    Medication management with supportive therapy. Risks/benefits and SE of the medication discussed. Pt verbalized understanding and  verbal consent obtained for treatment.  Affirm with the patient that the medications are taken as ordered. Patient expressed understanding of how their medications were to be used.   Meds: Zoloft 150 mg p.o. daily for depression and anxiety.  Higher doses cause sexual side effects. Wellbutrin 300 mg p.o. daily for depression No longer taking Adderall as prescribed by PCP   Labs: none  Therapy: brief supportive therapy provided. Discussed psychosocial stressors in detail.     Consultations: Encouraged to follow up with PCP as needed  Pt denies SI and is at an acute low risk for suicide. Patient told to call clinic if any problems occur. Patient advised to go to ER if they should develop SI/HI, side effects, or if symptoms worsen. Has crisis numbers to call if needed. Pt verbalized understanding.  F/up in 3 months or sooner if needed    Oletta Darter, MD 04/20/2017, 8:57 AM

## 2017-07-05 ENCOUNTER — Encounter: Payer: Self-pay | Admitting: Family Medicine

## 2017-07-05 ENCOUNTER — Ambulatory Visit: Payer: PRIVATE HEALTH INSURANCE | Admitting: Family Medicine

## 2017-07-05 VITALS — BP 98/64 | HR 87 | Temp 98.6°F | Wt 145.8 lb

## 2017-07-05 DIAGNOSIS — J018 Other acute sinusitis: Secondary | ICD-10-CM | POA: Diagnosis not present

## 2017-07-05 MED ORDER — AZITHROMYCIN 250 MG PO TABS: ORAL_TABLET | ORAL | 0 refills | Status: DC

## 2017-07-05 NOTE — Progress Notes (Signed)
   Subjective:    Patient ID: SAVVY PEETERS, female    DOB: Sep 29, 1985, 32 y.o.   MRN: [id number]  HPI Here for 2 weeks of sinus pressure, PND, and a dry cough. No fever. Using Robitussin DM.    Review of Systems  Constitutional: Negative.   HENT: Positive for congestion, postnasal drip, sinus pressure and sinus pain. Negative for sore throat.   Respiratory: Positive for cough.        Objective:   Physical Exam  Constitutional: She appears well-developed and well-nourished.  HENT:  Right Ear: External ear normal.  Left Ear: External ear normal.  Nose: Nose normal.  Mouth/Throat: Oropharynx is clear and moist.  Eyes: Conjunctivae are normal.  Neck: No thyromegaly present.  Pulmonary/Chest: Effort normal and breath sounds normal. No respiratory distress. She has no wheezes. She has no rales.  Lymphadenopathy:    She has no cervical adenopathy.          Assessment & Plan:  Sinusitis, treat with a Zpack. Add Mucinex D.  Gershon Crane, MD

## 2017-07-06 ENCOUNTER — Other Ambulatory Visit (HOSPITAL_COMMUNITY): Payer: Self-pay | Admitting: Psychiatry

## 2017-07-06 DIAGNOSIS — F411 Generalized anxiety disorder: Secondary | ICD-10-CM

## 2017-07-17 ENCOUNTER — Other Ambulatory Visit (HOSPITAL_COMMUNITY): Payer: Self-pay

## 2017-07-17 DIAGNOSIS — F331 Major depressive disorder, recurrent, moderate: Secondary | ICD-10-CM

## 2017-07-17 DIAGNOSIS — F411 Generalized anxiety disorder: Secondary | ICD-10-CM

## 2017-07-17 MED ORDER — SERTRALINE HCL 100 MG PO TABS: 150.0000 mg | ORAL_TABLET | Freq: Every day | ORAL | 0 refills | Status: DC

## 2017-07-17 NOTE — Progress Notes (Signed)
Appointment is 07/27/17, sent in 30 tabs per Regan.

## 2017-07-27 ENCOUNTER — Ambulatory Visit (INDEPENDENT_AMBULATORY_CARE_PROVIDER_SITE_OTHER): Payer: No Typology Code available for payment source | Admitting: Psychiatry

## 2017-07-27 DIAGNOSIS — G4700 Insomnia, unspecified: Secondary | ICD-10-CM

## 2017-07-27 DIAGNOSIS — F331 Major depressive disorder, recurrent, moderate: Secondary | ICD-10-CM

## 2017-07-27 DIAGNOSIS — F1099 Alcohol use, unspecified with unspecified alcohol-induced disorder: Secondary | ICD-10-CM | POA: Diagnosis not present

## 2017-07-27 DIAGNOSIS — Z87891 Personal history of nicotine dependence: Secondary | ICD-10-CM | POA: Diagnosis not present

## 2017-07-27 DIAGNOSIS — R45851 Suicidal ideations: Secondary | ICD-10-CM

## 2017-07-27 DIAGNOSIS — Z818 Family history of other mental and behavioral disorders: Secondary | ICD-10-CM

## 2017-07-27 DIAGNOSIS — F411 Generalized anxiety disorder: Secondary | ICD-10-CM

## 2017-07-27 MED ORDER — LORAZEPAM 0.5 MG PO TABS: 0.5000 mg | ORAL_TABLET | Freq: Every day | ORAL | 1 refills | Status: DC | PRN

## 2017-07-27 NOTE — Progress Notes (Signed)
BH MD/PA/NP OP Progress Note  07/27/2017 2:03 PM [name]  MRN:  [id number]  Chief Complaint:  Chief Complaint    Depression; Anxiety     HPI: Patient reports that she is very depressed and anxious.  In January her husband left her.  He is now living with his parents.  She has the house and there are 2 children.  Patient often blames herself although she knows rationally that it is his problem related to substance abuse.  Patient states that she cries often and is overwhelmed.  Her mother helps take care of the kids in the evenings and patient is working at nights.  As a result she is not getting much sleep at all.  Patient denies suicidal ideations but does report passive thoughts of death at times. She denies HI.   Her anxiety is very high and she has racing thoughts with restlessness.  She is taking her medication as prescribed and denies side effects.  Visit Diagnosis:    ICD-10-CM   1. Major depressive disorder, recurrent episode, moderate (HCC) F33.1   2. GAD (generalized anxiety disorder) F41.1 LORazepam (ATIVAN) 0.5 MG tablet    Past Psychiatric History:  Anxiety:Yes Bipolar Disorder:No Depression:Yes Mania:No Psychosis:No Schizophrenia:No Personality Disorder:No Hospitalization for psychiatric illness:No History of Electroconvulsive Shock Therapy:No Prior Suicide Attempts:No    Past Medical History:  Past Medical History:  Diagnosis Date  . Abnormal Pap smear of cervix   . Anxiety   . Brachydactyly of fingers   . Depression    sees Jorje Guild PA at [facility]  . GERD (gastroesophageal reflux disease)   . Gynecological examination    sees Dr. Henderson Cloud  . HPV (human papilloma virus) infection   . Pregnancy    due August 10, 2014  . Rheumatoid arthritis(714.0)    sees Dr. Azzie Roup   . Tracheomalacia, congenital     Past Surgical History:  Procedure Laterality Date  . BACK SURGERY    . HAND SURGERY  1996   to elongate the right 3rd  metacarpal   . LUMBAR FUSION  2008   per Dr. Gerlene Fee on L4-5 for spondylolisthesis     Family Psychiatric History:  Family History  Problem Relation Age of Onset  . Arthritis Mother   . Hyperlipidemia Father   . Heart disease Father   . Hypertension Father   . Arthritis Father   . Depression Father   . Anxiety disorder Father   . Arthritis Brother   . Psoriasis Brother   . Cleft lip Brother        without palate  . Uterine cancer Maternal Aunt        x 2  . Seizures Maternal Aunt   . Mental retardation Maternal Aunt        mild  . Seizures Cousin   . Depression Cousin        attempted suicide  . Suicidality Cousin   . Colon cancer Neg Hx     Social History:  Social History   Socioeconomic History  . Marital status: Married    Spouse name: Not on file  . Number of children: 1  . Years of education: Not on file  . Highest education level: Not on file  Occupational History  . Occupation: Academic librarian: [employer]  Social Needs  . Financial resource strain: Not on file  . Food insecurity:    Worry: Not on file    Inability: Not on file  .  Transportation needs:    Medical: Not on file    Non-medical: Not on file  Tobacco Use  . Smoking status: Former Smoker    Packs/day: 0.50    Types: Cigarettes    Last attempt to quit: 08/11/2007    Years since quitting: 9.9  . Smokeless tobacco: Never Used  Substance and Sexual Activity  . Alcohol use: Yes    Alcohol/week: 1.8 oz    Types: 3 Glasses of wine per week    Comment: occ  . Drug use: No  . Sexual activity: Yes    Partners: Male    Birth control/protection: None    Comment: had intercourse yesterday  Lifestyle  . Physical activity:    Days per week: Not on file    Minutes per session: Not on file  . Stress: Not on file  Relationships  . Social connections:    Talks on phone: Not on file    Gets together: Not on file    Attends religious service: Not on file    Active member of club or  organization: Not on file    Attends meetings of clubs or organizations: Not on file    Relationship status: Not on file  Other Topics Concern  . Not on file  Social History Narrative  . Not on file    Allergies:  Allergies  Allergen Reactions  . Nutmeg Oil (Myristica Oil) Hives  . Vicodin [Hydrocodone-Acetaminophen] Itching    Metabolic Disorder Labs: No results found for: HGBA1C, MPG No results found for: PROLACTIN No results found for: CHOL, TRIG, HDL, CHOLHDL, VLDL, LDLCALC Lab Results  Component Value Date   TSH 1.58 01/22/2016   TSH 1.53 10/20/2014    Therapeutic Level Labs: No results found for: LITHIUM No results found for: VALPROATE No components found for:  CBMZ  Current Medications: Current Outpatient Medications  Medication Sig Dispense Refill  . Adalimumab (HUMIRA PEN [place]) Inject into the skin.    Marland Kitchen buPROPion (WELLBUTRIN XL) 150 MG 24 hr tablet Take 2 tablets (300 mg total) by mouth daily. 270 tablet 0  . JUNEL FE 1/20 1-20 MG-MCG tablet Take 1 tablet by mouth daily.  7  . Melatonin 5 MG TABS Take by mouth.    . sertraline (ZOLOFT) 100 MG tablet Take 1.5 tablets (150 mg total) by mouth daily. 30 tablet 0  . LORazepam (ATIVAN) 0.5 MG tablet Take 1 tablet (0.5 mg total) by mouth daily as needed for anxiety. 30 tablet 1   No current facility-administered medications for this visit.      Musculoskeletal: Strength & Muscle Tone: within normal limits Gait & Station: normal Patient leans: N/A  Psychiatric Specialty Exam: Review of Systems  Constitutional: Positive for weight loss. Negative for chills and fever.  HENT: Positive for congestion, ear pain and sinus pain.     Blood pressure 122/78, pulse 97, height 5\' 1"  (1.549 m), weight 148 lb 3.2 oz (67.2 kg).Body mass index is 28 kg/m.  General Appearance: Casual  Eye Contact:  Good  Speech:  Clear and Coherent and Normal Rate  Volume:  Normal  Mood:  Depressed  Affect:  Congruent and Tearful   Thought Process:  Goal Directed and Descriptions of Associations: Intact  Orientation:  Full (Time, Place, and Person)  Thought Content: Logical   Suicidal Thoughts:  Yes.  without intent/plan  Homicidal Thoughts:  No  Memory:  Immediate;   Good Recent;   Good Remote;   Good  Judgement:  Good  Insight:  Good  Psychomotor Activity:  Normal  Concentration:  Concentration: Good and Attention Span: Good  Recall:  Good  Fund of Knowledge: Good  Language: Good  Akathisia:  No  Handed:  Right  AIMS (if indicated): not done  Assets:  Communication Skills Desire for Improvement Housing Transportation Vocational/Educational  ADL's:  Intact  Cognition: WNL  Sleep:  Poor   Screenings: PHQ2-9     Counselor from 06/20/2014 in BEHAVIORAL HEALTH OUTPATIENT THERAPY [place]  PHQ-2 Total Score  1      I reviewed the information below on 07/27/17 and agree except where noted Assessment and Plan: GAD; MDD-recurrent, moderate; insomnia    Medication management with supportive therapy. Risks/benefits and SE of the medication discussed. Pt verbalized understanding and verbal consent obtained for treatment.  Affirm with the patient that the medications are taken as ordered. Patient expressed understanding of how their medications were to be used.   Meds: Zoloft 150 mg p.o. daily for depression and anxiety.  Higher doses cause sexual side effects. Wellbutrin 300 mg p.o. daily for depression Start trial of Ativan 0.5mg  po qD prn anxiety No longer taking Adderall as prescribed by PCP   Labs: none  Therapy: brief supportive therapy provided. Discussed psychosocial stressors in detail.   Pt encouraged to continue individual therapy  Consultations: Encouraged to follow up with PCP as needed  Pt denies SI and is at an acute low risk for suicide. Patient told to call clinic if any problems occur. Patient advised to go to ER if they should develop SI/HI, side effects, or if symptoms  worsen. Has crisis numbers to call if needed. Pt verbalized understanding.  F/up in 2 months or sooner if needed     Oletta Darter, MD 07/27/2017, 2:03 PM

## 2017-08-12 ENCOUNTER — Other Ambulatory Visit (HOSPITAL_COMMUNITY): Payer: Self-pay | Admitting: Psychiatry

## 2017-08-12 DIAGNOSIS — F411 Generalized anxiety disorder: Secondary | ICD-10-CM

## 2017-08-12 DIAGNOSIS — F331 Major depressive disorder, recurrent, moderate: Secondary | ICD-10-CM

## 2017-09-28 ENCOUNTER — Ambulatory Visit (HOSPITAL_COMMUNITY): Payer: PRIVATE HEALTH INSURANCE | Admitting: Psychiatry

## 2017-11-16 ENCOUNTER — Ambulatory Visit (INDEPENDENT_AMBULATORY_CARE_PROVIDER_SITE_OTHER): Payer: No Typology Code available for payment source | Admitting: Psychiatry

## 2017-11-16 ENCOUNTER — Encounter (HOSPITAL_COMMUNITY): Payer: Self-pay | Admitting: Psychiatry

## 2017-11-16 DIAGNOSIS — F331 Major depressive disorder, recurrent, moderate: Secondary | ICD-10-CM | POA: Diagnosis not present

## 2017-11-16 DIAGNOSIS — Z79899 Other long term (current) drug therapy: Secondary | ICD-10-CM

## 2017-11-16 DIAGNOSIS — F411 Generalized anxiety disorder: Secondary | ICD-10-CM

## 2017-11-16 MED ORDER — BUPROPION HCL ER (XL) 150 MG PO TB24: 300.0000 mg | ORAL_TABLET | Freq: Every day | ORAL | 0 refills | Status: DC

## 2017-11-16 MED ORDER — LORAZEPAM 1 MG PO TABS: 1.0000 mg | ORAL_TABLET | Freq: Every day | ORAL | 1 refills | Status: DC | PRN

## 2017-11-16 MED ORDER — SERTRALINE HCL 100 MG PO TABS: 200.0000 mg | ORAL_TABLET | Freq: Every day | ORAL | 0 refills | Status: DC

## 2017-11-16 NOTE — Progress Notes (Signed)
BH MD/PA/NP OP Progress Note  11/16/2017 2:13 PM [name]  MRN:  [id number]  Chief Complaint:  Chief Complaint    Follow-up     HPI:   "I'm ok. It's still been pretty rough". She is going thru a rough divorce. At home pt is stressed related to finances and child care. Work is not going well- working 3:30a-3:30p. Pt is exhausted and as result she is irritable. Pt is not getting enough sleep. [name] mom is helping. Her depression is worse. She is overwhelmed and has low stress tolerance lately. Pt denies SI/HI. On her days off she goes to the gym and then sleeps. Her anxiety is high due to stressors. Pt is taking Ativan about once a week when very irritable. It helps to decrease her irritability a little and helps her sleep. [name] feels her quality of life is very poor right now and recognizes that her symptoms are related to situational stressors.  Visit Diagnosis:    ICD-10-CM   1. Major depressive disorder, recurrent episode, moderate (HCC) F33.1 buPROPion (WELLBUTRIN XL) 150 MG 24 hr tablet    sertraline (ZOLOFT) 100 MG tablet  2. GAD (generalized anxiety disorder) F41.1 LORazepam (ATIVAN) 1 MG tablet    sertraline (ZOLOFT) 100 MG tablet      Past Psychiatric History:  Anxiety:Yes Bipolar Disorder:No Depression:Yes Mania:No Psychosis:No Schizophrenia:No Personality Disorder:No Hospitalization for psychiatric illness:No History of Electroconvulsive Shock Therapy:No Prior Suicide Attempts:No    Past Medical History:  Past Medical History:  Diagnosis Date  . Abnormal Pap smear of cervix   . Anxiety   . Brachydactyly of fingers   . Depression    sees Jorje Guild [facility] at [facility]  . GERD (gastroesophageal reflux disease)   . Gynecological examination    sees Dr. Henderson Cloud  . HPV (human papilloma virus) infection   . Pregnancy    due August 10, 2014  . Rheumatoid arthritis(714.0)    sees Dr. Azzie Roup   . Tracheomalacia, congenital     Past  Surgical History:  Procedure Laterality Date  . BACK SURGERY    . HAND SURGERY  1996   to elongate the right 3rd metacarpal   . LUMBAR FUSION  2008   per Dr. Gerlene Fee on L4-5 for spondylolisthesis     Family Psychiatric History:  Family History  Problem Relation Age of Onset  . Arthritis Mother   . Hyperlipidemia Father   . Heart disease Father   . Hypertension Father   . Arthritis Father   . Depression Father   . Anxiety disorder Father   . Arthritis Brother   . Psoriasis Brother   . Cleft lip Brother        without palate  . Uterine cancer Maternal Aunt        x 2  . Seizures Maternal Aunt   . Mental retardation Maternal Aunt        mild  . Seizures Cousin   . Depression Cousin        attempted suicide  . Suicidality Cousin   . Colon cancer Neg Hx     Social History:  Social History   Socioeconomic History  . Marital status: Legally Separated    Spouse name: Not on file  . Number of children: 1  . Years of education: Not on file  . Highest education level: Not on file  Occupational History  . 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 Physicist, medical  strain: Not on file  . Food insecurity:    Worry: Not on file    Inability: Not on file  . Transportation needs:    Medical: Not on file    Non-medical: Not on file  Tobacco Use  . Smoking status: Former Smoker    Packs/day: 0.50    Types: Cigarettes    Last attempt to quit: 08/11/2007    Years since quitting: 10.2  . Smokeless tobacco: Never Used  Substance and Sexual Activity  . Alcohol use: Yes    Alcohol/week: 1.8 oz    Types: 3 Glasses of wine per week    Comment: occ  . Drug use: No  . Sexual activity: Yes    Partners: Male    Birth control/protection: None    Comment: had intercourse yesterday  Lifestyle  . Physical activity:    Days per week: Not on file    Minutes per session: Not on file  . Stress: Not on file  Relationships  . Social connections:    Talks on  phone: Not on file    Gets together: Not on file    Attends religious service: Not on file    Active member of club or organization: Not on file    Attends meetings of clubs or organizations: Not on file    Relationship status: Not on file  Other Topics Concern  . Not on file  Social History Narrative  . Not on file    Allergies:  Allergies  Allergen Reactions  . Nutmeg Oil (Myristica Oil) Hives  . Vicodin [Hydrocodone-Acetaminophen] Itching    Metabolic Disorder Labs: No results found for: HGBA1C, MPG No results found for: PROLACTIN No results found for: CHOL, TRIG, HDL, CHOLHDL, VLDL, LDLCALC Lab Results  Component Value Date   TSH 1.58 01/22/2016   TSH 1.53 10/20/2014    Therapeutic Level Labs: No results found for: LITHIUM No results found for: VALPROATE No components found for:  CBMZ  Current Medications: Current Outpatient Medications  Medication Sig Dispense Refill  . Adalimumab (HUMIRA PEN [place]) Inject into the skin.    Marland Kitchen buPROPion (WELLBUTRIN XL) 150 MG 24 hr tablet Take 2 tablets (300 mg total) by mouth daily. 270 tablet 0  . JUNEL FE 1/20 1-20 MG-MCG tablet Take 1 tablet by mouth daily.  7  . LORazepam (ATIVAN) 1 MG tablet Take 1 tablet (1 mg total) by mouth daily as needed for anxiety. 30 tablet 1  . Melatonin 5 MG TABS Take by mouth.    . sertraline (ZOLOFT) 100 MG tablet Take 2 tablets (200 mg total) by mouth daily. 180 tablet 0   No current facility-administered medications for this visit.      Musculoskeletal: Strength & Muscle Tone: normal Gait & Station: normal, normal Patient leans: straight  Psychiatric Specialty Exam:   Review of Systems  Constitutional: Positive for malaise/fatigue. Negative for chills and fever.  Psychiatric/Behavioral: Positive for depression. Negative for suicidal ideas. The patient is nervous/anxious and has insomnia.     Blood pressure 110/70, pulse 80, height 5' (1.524 m), weight 147 lb (66.7 kg).Body mass index is  28.71 kg/m.  General Appearance: Casual  Eye Contact:  Good  Speech:  Clear and Coherent and Normal Rate  Volume:  Normal  Mood:  Depressed  Affect:  Congruent  Thought Process:  Goal Directed and Descriptions of Associations: Intact  Orientation:  Full (Time, Place, and Person)  Thought Content:  Logical  Suicidal Thoughts:  No  Homicidal  Thoughts:  No  Memory:  Immediate;   Good Recent;   Good Remote;   Good  Judgement:  Good  Insight:  Good  Psychomotor Activity:  Normal  Concentration:  Concentration: Good and Attention Span: Good  Recall:  Good  Fund of Knowledge:  Good  Language:  Good  Akathisia:  No  Handed:  Right  AIMS (if indicated):     Assets:  Communication Skills Desire for Improvement Financial Resources/Insurance Housing Social Support Transportation Vocational/Educational  ADL's:  Intact  Cognition:  WNL  Sleep:   poor       Screenings: PHQ2-9     Counselor from 06/20/2014 in BEHAVIORAL HEALTH OUTPATIENT THERAPY [place]  PHQ-2 Total Score  1      I reviewed the information below on 11/16/17 and agree except where changed Assessment and Plan: GAD; MDD-recurrent, moderate; insomnia    Medication management with supportive therapy. Risks/benefits and SE of the medication discussed. Pt verbalized understanding and verbal consent obtained for treatment.  Affirm with the patient that the medications are taken as ordered. Patient expressed understanding of how their medications were to be used.   Meds: increase Zoloft 200 mg p.o. daily for depression and anxiety.  Wellbutrin 300 mg p.o. daily for depression Increase Ativan 1mg  po qD prn anxiety    Labs: none  Therapy: brief supportive therapy provided. Discussed psychosocial stressors in detail.   Pt encouraged to continue individual therapy  Consultations: Encouraged to follow up with PCP as needed  Pt denies SI and is at an acute low risk for suicide. Patient told to call  clinic if any problems occur. Patient advised to go to ER if they should develop SI/HI, side effects, or if symptoms worsen. Has crisis numbers to call if needed. Pt verbalized understanding.  F/up in 2 months or sooner if needed     Oletta Darter, MD 11/16/2017, 2:12 PM

## 2017-11-23 ENCOUNTER — Ambulatory Visit: Payer: PRIVATE HEALTH INSURANCE | Admitting: Adult Health

## 2017-11-23 ENCOUNTER — Encounter: Payer: Self-pay | Admitting: Adult Health

## 2017-11-23 VITALS — BP 98/66 | Temp 99.0°F | Wt 147.0 lb

## 2017-11-23 DIAGNOSIS — J029 Acute pharyngitis, unspecified: Secondary | ICD-10-CM

## 2017-11-23 LAB — POCT RAPID STREP A (OFFICE): Rapid Strep A Screen: NEGATIVE

## 2017-11-23 MED ORDER — PREDNISONE 10 MG PO TABS: 10.0000 mg | ORAL_TABLET | Freq: Every day | ORAL | 0 refills | Status: DC

## 2017-11-23 MED ORDER — AMOXICILLIN-POT CLAVULANATE 875-125 MG PO TABS: 1.0000 | ORAL_TABLET | Freq: Two times a day (BID) | ORAL | 0 refills | Status: DC

## 2017-11-23 NOTE — Patient Instructions (Signed)
It was great meeting you today   Your strep test came back negative but do to symptoms I am going to treat you with Augmentin BID x 10 days and prescribe prednisone to help with inflammation   Continue with tylenol and/or motrin for symptom relief.   Follow up if no improvement in the next 2-3 days

## 2017-11-23 NOTE — Progress Notes (Signed)
Subjective:    Patient ID: [name], female    DOB: 06/17/1985, 32 y.o.   MRN: [id number]  HPI 32 year old female who  has a past medical history of Abnormal Pap smear of cervix, Anxiety, Brachydactyly of fingers, Depression, GERD (gastroesophageal reflux disease), Gynecological examination, HPV (human papilloma virus) infection, Pregnancy, Rheumatoid arthritis(714.0), and Tracheomalacia, congenital.   She is a patient of Dr. Clent Ridges who I am seeing today for an acute issue of sore throat and body aches with associated low grade fever and fatigue . She denies any n/v/d. 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 Abnormal Pap smear of cervix   . Anxiety   . Brachydactyly of fingers   . Depression    sees Jorje Guild PA at [facility]  . GERD (gastroesophageal reflux disease)   . Gynecological examination    sees Dr. Henderson Cloud  . HPV (human papilloma virus) infection   . Pregnancy    due August 10, 2014  . Rheumatoid arthritis(714.0)    sees Dr. Azzie Roup   . Tracheomalacia, congenital     Social History   Socioeconomic History  . Marital status: Legally Separated    Spouse name: Not on file  . Number of children: 1  . Years of education: Not on file  . Highest education level: Not on file  Occupational History  . Occupation: Academic librarian: [employer]  Social Needs  . Financial resource strain: Not on file  . Food insecurity:    Worry: Not on file    Inability: Not on file  . Transportation needs:    Medical: Not on file    Non-medical: Not on file  Tobacco Use  . Smoking status: Former Smoker    Packs/day: 0.50    Types: Cigarettes    Last attempt to quit: 08/11/2007    Years since quitting: 10.2  . Smokeless tobacco: Never Used  Substance and Sexual Activity  . Alcohol use: Yes    Alcohol/week: 1.8 oz    Types: 3 Glasses of wine per week   Comment: occ  . Drug use: No  . Sexual activity: Yes    Partners: Male    Birth control/protection: None    Comment: had intercourse yesterday  Lifestyle  . Physical activity:    Days per week: Not on file    Minutes per session: Not on file  . Stress: Not on file  Relationships  . Social connections:    Talks on phone: Not on file    Gets together: Not on file    Attends religious service: Not on file    Active member of club or organization: Not on file    Attends meetings of clubs or organizations: Not on file    Relationship status: Not on file  . Intimate partner violence:    Fear of current or ex partner: Not on file    Emotionally abused: Not on file    Physically abused: Not on file    Forced sexual activity: Not on file  Other Topics Concern  . Not on file  Social History Narrative  . Not on file    Past Surgical History:  Procedure Laterality Date  . BACK SURGERY    . HAND SURGERY  1996   to elongate the right 3rd metacarpal   .  LUMBAR FUSION  2008   per Dr. Gerlene Fee on L4-5 for spondylolisthesis     Family History  Problem Relation Age of Onset  . Arthritis Mother   . Hyperlipidemia Father   . Heart disease Father   . Hypertension Father   . Arthritis Father   . Depression Father   . Anxiety disorder Father   . Arthritis Brother   . Psoriasis Brother   . Cleft lip Brother        without palate  . Uterine cancer Maternal Aunt        x 2  . Seizures Maternal Aunt   . Mental retardation Maternal Aunt        mild  . Seizures Cousin   . Depression Cousin        attempted suicide  . Suicidality Cousin   . Colon cancer Neg Hx     Allergies  Allergen Reactions  . Nutmeg Oil (Myristica Oil) Hives  . Vicodin [Hydrocodone-Acetaminophen] Itching    Current Outpatient Medications on File Prior to Visit  Medication Sig Dispense Refill  . Adalimumab (HUMIRA PEN [place]) Inject into the skin.    Marland Kitchen buPROPion (WELLBUTRIN XL) 150 MG 24 hr tablet Take 2  tablets (300 mg total) by mouth daily. 270 tablet 0  . JUNEL FE 1/20 1-20 MG-MCG tablet Take 1 tablet by mouth daily.  7  . LORazepam (ATIVAN) 1 MG tablet Take 1 tablet (1 mg total) by mouth daily as needed for anxiety. 30 tablet 1  . Melatonin 5 MG TABS Take by mouth.    . sertraline (ZOLOFT) 100 MG tablet Take 2 tablets (200 mg total) by mouth daily. 180 tablet 0   No current facility-administered medications on file prior to visit.     BP 98/66   Temp 99 F (37.2 C)   Wt 147 lb (66.7 kg)   BMI 28.71 kg/m       Objective:   Physical Exam  Constitutional: She appears well-developed and well-nourished. She appears ill.  HENT:  Mouth/Throat: Uvula is midline. Posterior oropharyngeal edema and posterior oropharyngeal erythema present. Tonsils are 2+ on the right. Tonsils are 2+ on the left. Tonsillar exudate (right side ).  Cardiovascular: Normal rate and regular rhythm.  No murmur heard. Pulmonary/Chest: Effort normal and breath sounds normal.  Lymphadenopathy:       Head (right side): Tonsillar and preauricular adenopathy present.       Head (left side): Tonsillar and preauricular adenopathy present.  Skin: Skin is warm and dry.  Psychiatric: She has a normal mood and affect. Her behavior is normal. Judgment and thought content normal.  Vitals reviewed.     Assessment & Plan:  1. Sore throat - POC Rapid Strep A- negative  - Will treat due to symptoms.  - predniSONE (DELTASONE) 10 MG tablet; Take 1 tablet (10 mg total) by mouth daily with breakfast.  Dispense: 5 tablet; Refill: 0 - amoxicillin-clavulanate (AUGMENTIN) 875-125 MG tablet; Take 1 tablet by mouth 2 (two) times daily.  Dispense: 20 tablet; Refill: 0 - Tylenol/motrin for symptom relief.  - Warm liquids  - Follow up if no improvement in the next 2-3 days   Shirline Frees, NP

## 2018-01-01 HISTORY — PX: NASAL SEPTUM SURGERY: SHX37

## 2018-01-03 MED ORDER — OXYCODONE-ACETAMINOPHEN 5-325 MG PO TABS
1.00 | ORAL_TABLET | ORAL | Status: DC
Start: ? — End: 2018-01-03

## 2018-01-03 MED ORDER — DIPHENHYDRAMINE HCL 25 MG PO CAPS
25.00 | ORAL_CAPSULE | ORAL | Status: DC
Start: ? — End: 2018-01-03

## 2018-01-03 MED ORDER — MUPIROCIN 2 % EX OINT
TOPICAL_OINTMENT | CUTANEOUS | Status: DC
Start: 2018-01-03 — End: 2018-01-03

## 2018-01-03 MED ORDER — IBUPROFEN 400 MG PO TABS
400.00 | ORAL_TABLET | ORAL | Status: DC
Start: ? — End: 2018-01-03

## 2018-01-03 MED ORDER — ENOXAPARIN SODIUM 40 MG/0.4ML ~~LOC~~ SOLN
40.00 | SUBCUTANEOUS | Status: DC
Start: 2018-01-04 — End: 2018-01-03

## 2018-01-03 MED ORDER — ACETAMINOPHEN 325 MG PO TABS
325.00 | ORAL_TABLET | ORAL | Status: DC
Start: ? — End: 2018-01-03

## 2018-01-03 MED ORDER — ONDANSETRON 4 MG PO TBDP
4.00 | ORAL_TABLET | ORAL | Status: DC
Start: ? — End: 2018-01-03

## 2018-01-03 MED ORDER — GENERIC EXTERNAL MEDICATION
1.00 | Status: DC
Start: 2018-01-03 — End: 2018-01-03

## 2018-01-03 MED ORDER — BACITRACIN ZINC 500 UNIT/GM EX OINT
TOPICAL_OINTMENT | CUTANEOUS | Status: DC
Start: 2018-01-03 — End: 2018-01-03

## 2018-01-05 MED ORDER — IBUPROFEN 200 MG PO TABS
800.00 | ORAL_TABLET | ORAL | Status: DC
Start: ? — End: 2018-01-05

## 2018-01-05 MED ORDER — CLINDAMYCIN PHOSPHATE IN D5W 900 MG/50ML IV SOLN
900.00 | INTRAVENOUS | Status: DC
Start: 2018-01-05 — End: 2018-01-05

## 2018-01-05 MED ORDER — ACETAMINOPHEN 325 MG PO TABS
650.00 | ORAL_TABLET | ORAL | Status: DC
Start: ? — End: 2018-01-05

## 2018-01-05 MED ORDER — LACTATED RINGERS IV SOLN
INTRAVENOUS | Status: DC
Start: ? — End: 2018-01-05

## 2018-01-05 MED ORDER — OXYCODONE HCL 5 MG PO TABS
5.00 | ORAL_TABLET | ORAL | Status: DC
Start: ? — End: 2018-01-05

## 2018-01-11 ENCOUNTER — Ambulatory Visit (INDEPENDENT_AMBULATORY_CARE_PROVIDER_SITE_OTHER): Payer: No Typology Code available for payment source | Admitting: Psychiatry

## 2018-01-11 ENCOUNTER — Encounter (HOSPITAL_COMMUNITY): Payer: Self-pay | Admitting: Psychiatry

## 2018-01-11 ENCOUNTER — Ambulatory Visit (HOSPITAL_COMMUNITY): Payer: Self-pay | Admitting: Psychiatry

## 2018-01-11 DIAGNOSIS — Z818 Family history of other mental and behavioral disorders: Secondary | ICD-10-CM | POA: Diagnosis not present

## 2018-01-11 DIAGNOSIS — F411 Generalized anxiety disorder: Secondary | ICD-10-CM | POA: Diagnosis not present

## 2018-01-11 DIAGNOSIS — F331 Major depressive disorder, recurrent, moderate: Secondary | ICD-10-CM

## 2018-01-11 DIAGNOSIS — Z79899 Other long term (current) drug therapy: Secondary | ICD-10-CM

## 2018-01-11 DIAGNOSIS — Z87891 Personal history of nicotine dependence: Secondary | ICD-10-CM | POA: Diagnosis not present

## 2018-01-11 NOTE — Progress Notes (Signed)
BH MD/PA/NP OP Progress Note  01/11/2018 4:08 PM [name]  MRN:  [id number]  Chief Complaint:  Chief Complaint    Hospitalization Follow-up; Medication Reaction     HPI: Patient had surgery for a deviated septum at [facility] about a week ago.  She then suffered from what was either malignant hyperthermia or serotonin syndrome postoperatively.  I spoke with the ICU doctor and recommended that the patient stop Zoloft.  She followed up with her nasal surgeon earlier today and per chart review he stated that patient is stable postoperatively and will return for follow-up in 3 months.  She tells me that because of the ambiguous symptoms that she was experiencing they plan to do a muscle biopsy to confirm or deny malignant hyperthermia in the near future.  Today pt states she stopped the Zoloft since Sept 9th. Pt took Tramadol qD for 3-4 days but denies any symptoms from taking it. The last time was about 3 days ago. Pt has only been taking Wellbutrin XL 150mg  for the last 7 days.  She states that she got out of the hospital last Friday.  She has been home with her kids and her mom since then.  She goes back to work next week on Thursday.  Overall she feels like she is doing well.  [name] states that she does not feel depressed or anxious.  She is not sure if it is due to shock or if she is really okay.  She denies isolation, crying spells, hopelessness.  She is not sleeping well due to instructions not to take sedatives like melatonin or Ativan after her hospital discharge.  She has had a lot of thoughts about where her children would go when who would take care of them if she had died.  It has been scary for her but she has support from her mother and a close friend.  Patient denies SI/HI.    Visit Diagnosis:    ICD-10-CM   1. Major depressive disorder, recurrent episode, moderate (HCC) F33.1   2. GAD (generalized anxiety disorder) F41.1       Past Psychiatric History:   Anxiety:Yes Bipolar Disorder:No Depression:Yes Mania:No Psychosis:No Schizophrenia:No Personality Disorder:No Hospitalization for psychiatric illness:No History of Electroconvulsive Shock Therapy:No Prior Suicide Attempts:No    Past Medical History:  Past Medical History:  Diagnosis Date  . Abnormal Pap smear of cervix   . Anxiety   . Brachydactyly of fingers   . Depression    sees Jorje Guild PA at [facility]  . GERD (gastroesophageal reflux disease)   . Gynecological examination    sees Dr. Henderson Cloud  . HPV (human papilloma virus) infection   . Pregnancy    due August 10, 2014  . Rheumatoid arthritis(714.0)    sees Dr. Azzie Roup   . Tracheomalacia, congenital     Past Surgical History:  Procedure Laterality Date  . BACK SURGERY    . HAND SURGERY  1996   to elongate the right 3rd metacarpal   . LUMBAR FUSION  2008   per Dr. Gerlene Fee on L4-5 for spondylolisthesis   . NASAL SEPTUM SURGERY  01/01/2018    Family Psychiatric History:  Family History  Problem Relation Age of Onset  . Arthritis Mother   . Hyperlipidemia Father   . Heart disease Father   . Hypertension Father   . Arthritis Father   . Depression Father   . Anxiety disorder Father   . Arthritis Brother   . Psoriasis  Brother   . Cleft lip Brother        without palate  . Uterine cancer Maternal Aunt        x 2  . Seizures Maternal Aunt   . Mental retardation Maternal Aunt        mild  . Seizures Cousin   . Depression Cousin        attempted suicide  . Suicidality Cousin   . Colon cancer Neg Hx     Social History:  Social History   Socioeconomic History  . Marital status: Legally Separated    Spouse name: Not on file  . Number of children: 1  . Years of education: Not on file  . Highest education level: Not on file  Occupational History  . Occupation: Academic librarian: [employer]  Social Needs  . Financial resource strain: Not on file  . Food insecurity:     Worry: Not on file    Inability: Not on file  . Transportation needs:    Medical: Not on file    Non-medical: Not on file  Tobacco Use  . Smoking status: Former Smoker    Packs/day: 0.50    Types: Cigarettes    Last attempt to quit: 08/11/2007    Years since quitting: 10.4  . Smokeless tobacco: Never Used  Substance and Sexual Activity  . Alcohol use: Yes    Alcohol/week: 3.0 standard drinks    Types: 3 Glasses of wine per week    Comment: occ  . Drug use: No  . Sexual activity: Yes    Partners: Male    Birth control/protection: None    Comment: had intercourse yesterday  Lifestyle  . Physical activity:    Days per week: Not on file    Minutes per session: Not on file  . Stress: Not on file  Relationships  . Social connections:    Talks on phone: Not on file    Gets together: Not on file    Attends religious service: Not on file    Active member of club or organization: Not on file    Attends meetings of clubs or organizations: Not on file    Relationship status: Not on file  Other Topics Concern  . Not on file  Social History Narrative  . Not on file    Allergies:  Allergies  Allergen Reactions  . 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 Nutmeg Oil (Myristica Oil) Hives  . 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 Vicodin [Hydrocodone-Acetaminophen] Itching    Metabolic Disorder Labs: No results found for: HGBA1C, MPG No results found for: PROLACTIN No results found for: CHOL, TRIG, HDL, CHOLHDL, VLDL, LDLCALC Lab Results  Component Value Date   TSH 1.58 01/22/2016   TSH 1.53 10/20/2014    Therapeutic Level Labs: No results found for: LITHIUM No results found for: VALPROATE No components found for:  CBMZ  Current Medications: Current Outpatient Medications  Medication Sig Dispense Refill  . buPROPion (WELLBUTRIN XL) 150 MG 24 hr tablet Take 2 tablets (300 mg  total) by mouth daily. 270 tablet 0  . clindamycin (CLEOCIN) 150 MG capsule TAKE 2 CAPSULES BY MOUTH 3 TIMES DAILY FOR 7 DAYS.  0  . JUNEL FE 1/20 1-20 MG-MCG tablet Take 1 tablet by mouth daily.  7  . LORazepam (ATIVAN) 1 MG tablet Take 1 tablet (1 mg total) by  mouth daily as needed for anxiety. 30 tablet 1  . Melatonin 5 MG TABS Take by mouth.    . Adalimumab (HUMIRA PEN [place]) Inject into the skin.     No current facility-administered medications for this visit.      Musculoskeletal: Strength & Muscle Tone: normal Gait & Station: normal, normal Patient leans: straight  Psychiatric Specialty Exam: Review of Systems  Gastrointestinal: Negative for abdominal pain, nausea and vomiting.  Neurological: Positive for headaches. Negative for dizziness, tremors and speech change.       Hot flashes     Blood pressure 140/78, height 5' 0.75" (1.543 m), weight 147 lb (66.7 kg).Body mass index is 28 kg/m.  General Appearance: Casual  Eye Contact:  Good  Speech:  Clear and Coherent and Normal Rate  Volume:  Normal  Mood:  Euthymic  Affect:  Full Range  Thought Process:  Goal Directed and Descriptions of Associations: Intact  Orientation:  Full (Time, Place, and Person)  Thought Content:  Logical  Suicidal Thoughts:  No  Homicidal Thoughts:  No  Memory:  Immediate;   Good Recent;   Good Remote;   Good  Judgement:  Good  Insight:  Good  Psychomotor Activity:  Normal  Concentration:  Concentration: Good  Recall:  Good  Fund of Knowledge:  Good  Language:  Good  Akathisia:  No  Handed:  Right  AIMS (if indicated):     Assets:  Communication Skills Desire for Improvement Financial Resources/Insurance Housing Leisure Time Social Support Transportation Vocational/Educational  ADL's:  Intact  Cognition:  WNL  Sleep:   poor         Screenings: PHQ2-9     Counselor from 06/20/2014 in BEHAVIORAL HEALTH OUTPATIENT THERAPY [place]  PHQ-2 Total Score  1      Reviewed  the information below on 01/11/2018 and have updated Assessment and Plan: GAD; MDD-recurrent, moderate; insomnia    Medication management with supportive therapy. Risks/benefits and SE of the medication discussed. Pt verbalized understanding and verbal consent obtained for treatment.  Affirm with the patient that the medications are taken as ordered. Patient expressed understanding of how their medications were to be used.   Meds: d/c Zoloft due to recent ICU treatment for potential serotonin syndrome Increase Wellbutrin XL 300 mg p.o. daily for depression. May consider increasing to 450mg  if needed Restart  Ativan 1mg  po qD prn anxiety    Labs: none  Therapy: brief supportive therapy provided. Discussed psychosocial stressors in detail.   Pt encouraged to continue individual therapy We talked about processing her recent medical emergency and its consequences in detail.  Consultations: Encouraged to follow up with PCP as needed  Pt denies SI and is at an acute low risk for suicide. Patient told to call clinic if any problems occur. Patient advised to go to ER if they should develop SI/HI, side effects, or if symptoms worsen. Has crisis numbers to call if needed. Pt verbalized understanding.  F/up in 1 months or sooner if needed     , MD 01/11/2018, 4:08 PM

## 2018-02-08 ENCOUNTER — Other Ambulatory Visit: Payer: Self-pay

## 2018-02-08 ENCOUNTER — Encounter (HOSPITAL_COMMUNITY): Payer: Self-pay | Admitting: Psychiatry

## 2018-02-08 ENCOUNTER — Ambulatory Visit (INDEPENDENT_AMBULATORY_CARE_PROVIDER_SITE_OTHER): Payer: No Typology Code available for payment source | Admitting: Psychiatry

## 2018-02-08 DIAGNOSIS — F411 Generalized anxiety disorder: Secondary | ICD-10-CM

## 2018-02-08 DIAGNOSIS — F331 Major depressive disorder, recurrent, moderate: Secondary | ICD-10-CM

## 2018-02-08 MED ORDER — DIVALPROEX SODIUM ER 500 MG PO TB24: 500.0000 mg | ORAL_TABLET | Freq: Every day | ORAL | 1 refills | Status: DC

## 2018-02-08 MED ORDER — LORAZEPAM 1 MG PO TABS: 1.0000 mg | ORAL_TABLET | Freq: Every day | ORAL | 1 refills | Status: DC | PRN

## 2018-02-08 MED ORDER — BUPROPION HCL ER (XL) 150 MG PO TB24: 300.0000 mg | ORAL_TABLET | Freq: Every day | ORAL | 0 refills | Status: DC

## 2018-02-08 NOTE — Progress Notes (Signed)
BH MD/PA/NP OP Progress Note  02/08/2018 1:52 PM [name]  MRN:  [id number]  Chief Complaint:  Chief Complaint    Depression     HPI: Patient states that her depression and irritability are not improving.  She states she has a very low frustration tolerance and is easily overwhelmed.  Most days of the week she feels depressed, angry, fatigue.  She is not sleeping well.  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  Patient denies any SI/HI.  She feels that she needs medication but does not know what to do as she cannot take any serotonin agents.  She is fearful of trying anything new but will do it.    Visit Diagnosis:    ICD-10-CM   1. GAD (generalized anxiety disorder) F41.1 LORazepam (ATIVAN) 1 MG tablet  2. Major depressive disorder, recurrent episode, moderate (HCC) F33.1 divalproex (DEPAKOTE ER) 500 MG 24 hr tablet    buPROPion (WELLBUTRIN XL) 150 MG 24 hr tablet      Past Psychiatric History:  Anxiety:Yes Bipolar Disorder:No Depression:Yes Mania:No Psychosis:No Schizophrenia:No Personality Disorder:No Hospitalization for psychiatric illness:No History of Electroconvulsive Shock Therapy:No Prior Suicide Attempts:No    Past Medical History:  Past Medical History:  Diagnosis Date  . Abnormal Pap smear of cervix   . Anxiety   . Brachydactyly of fingers   . Depression    sees Jorje Guild PA at [facility]  . GERD (gastroesophageal reflux disease)   . Gynecological examination    sees Dr. Henderson Cloud  . HPV (human papilloma virus) infection   . Pregnancy    due August 10, 2014  . Rheumatoid arthritis(714.0)    sees Dr. Azzie Roup   . Tracheomalacia, congenital     Past Surgical History:  Procedure Laterality Date  . BACK SURGERY    . HAND SURGERY  1996   to elongate the right 3rd metacarpal   . LUMBAR FUSION  2008   per Dr. Gerlene Fee on L4-5 for spondylolisthesis   . NASAL SEPTUM SURGERY   01/01/2018    Family Psychiatric History:  Family History  Problem Relation Age of Onset  . Arthritis Mother   . Hyperlipidemia Father   . Heart disease Father   . Hypertension Father   . Arthritis Father   . Depression Father   . Anxiety disorder Father   . Arthritis Brother   . Psoriasis Brother   . Cleft lip Brother        without palate  . Uterine cancer Maternal Aunt        x 2  . Seizures Maternal Aunt   . Mental retardation Maternal Aunt        mild  . Seizures Cousin   . Depression Cousin        attempted suicide  . Suicidality Cousin   . Colon cancer Neg Hx     Social History:  Social History   Socioeconomic History  . Marital status: Legally Separated    Spouse name: Not on file  . Number of children: 1  . Years of education: Not on file  . Highest education level: Not on file  Occupational History  . 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 Financial resource strain: Not on file  . Food insecurity:    Worry: Not on file    Inability: Not on file  . Transportation needs:    Medical: Not  on file    Non-medical: Not on file  Tobacco Use  . Smoking status: Former Smoker    Packs/day: 0.50    Types: Cigarettes    Last attempt to quit: 08/11/2007    Years since quitting: 10.5  . Smokeless tobacco: Never Used  Substance and Sexual Activity  . Alcohol use: Yes    Alcohol/week: 3.0 standard drinks    Types: 3 Glasses of wine per week    Comment: occ  . Drug use: No  . Sexual activity: Yes    Partners: Male    Birth control/protection: None    Comment: had intercourse yesterday  Lifestyle  . Physical activity:    Days per week: Not on file    Minutes per session: Not on file  . Stress: Not on file  Relationships  . Social connections:    Talks on phone: Not on file    Gets together: Not on file    Attends religious service: Not on file    Active member of club or organization: Not on file    Attends meetings of clubs or  organizations: Not on file    Relationship status: Not on file  Other Topics Concern  . Not on file  Social History Narrative  . Not on file    Allergies:  Allergies  Allergen Reactions  . 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 Nutmeg Oil (Myristica Oil) Hives  . 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 Vicodin [Hydrocodone-Acetaminophen] Itching    Metabolic Disorder Labs: No results found for: HGBA1C, MPG No results found for: PROLACTIN No results found for: CHOL, TRIG, HDL, CHOLHDL, VLDL, LDLCALC Lab Results  Component Value Date   TSH 1.58 01/22/2016   TSH 1.53 10/20/2014    Therapeutic Level Labs: No results found for: LITHIUM No results found for: VALPROATE No components found for:  CBMZ  Current Medications: Current Outpatient Medications  Medication Sig Dispense Refill  . Adalimumab (HUMIRA PEN [place]) Inject into the skin.    Marland Kitchen buPROPion (WELLBUTRIN XL) 150 MG 24 hr tablet Take 2 tablets (300 mg total) by mouth daily. 270 tablet 0  . JUNEL FE 1/20 1-20 MG-MCG tablet Take 1 tablet by mouth daily.  7  . LORazepam (ATIVAN) 1 MG tablet Take 1 tablet (1 mg total) by mouth daily as needed for anxiety. 30 tablet 1  . Melatonin 5 MG TABS Take by mouth.    . clindamycin (CLEOCIN) 150 MG capsule TAKE 2 CAPSULES BY MOUTH 3 TIMES DAILY FOR 7 DAYS.  0  . divalproex (DEPAKOTE ER) 500 MG 24 hr tablet Take 1 tablet (500 mg total) by mouth daily. 30 tablet 1   No current facility-administered medications for this visit.      Musculoskeletal: Strength & Muscle Tone: normal Gait & Station: normal, normal Patient leans: straight   Psychiatric Specialty Exam: Review of Systems  Psychiatric/Behavioral: Positive for depression. Negative for hallucinations and suicidal ideas. The patient has insomnia. The patient is not nervous/anxious.     Blood pressure 139/80, pulse 89, resp. rate 14,  weight 150 lb 3.2 oz (68.1 kg), SpO2 100 %.Body mass index is 28.61 kg/m.  General Appearance: Casual  Eye Contact:  Good  Speech:  Clear and Coherent and Normal Rate  Volume:  Normal  Mood:  Depressed  Affect:  Congruent  Thought Process:  Goal Directed and Descriptions of Associations: Intact  Orientation:  Full (Time, Place, and Person)  Thought Content:  Logical  Suicidal Thoughts:  No  Homicidal Thoughts:  No  Memory:  Immediate;   Good  Judgement:  Fair  Insight:  Fair  Psychomotor Activity:  Normal  Concentration:  Concentration: Good  Recall:  Good  Fund of Knowledge:  Good  Language:  Good  Akathisia:  No  Handed:  Right  AIMS (if indicated):     Assets:  Communication Skills Desire for Improvement Housing Social Support Transportation Vocational/Educational  ADL's:  Intact  Cognition:  WNL  Sleep:   poor        Screenings: PHQ2-9     Counselor from 06/20/2014 in BEHAVIORAL HEALTH OUTPATIENT THERAPY [place]  PHQ-2 Total Score  1      I reviewed the information below on 02/08/2018 and I have updated it Assessment and Plan: GAD; MDD-recurrent, moderate; insomnia    Medication management with supportive therapy. Risks/benefits and SE of the medication discussed. Pt verbalized understanding and verbal consent obtained for treatment.  Affirm with the patient that the medications are taken as ordered. Patient expressed understanding of how their medications were to be used.   Meds: Avoid serotonin agents due to ICU treatment for potential serotonin syndrome  Wellbutrin XL 300 mg p.o. daily for depression. May consider increasing to 450mg  if needed Ativan 1mg  po qD prn anxiety Start Depakote ER 500mg  po qHS for MDD    Labs: reviewed labs on care everywhere done 01/11/18- AST/ALT and platelets WNL Labs showed decreased hematocrit and hemoglobin-I advised the patient to start an over-the-counter slow release iron supplement  Therapy: brief  supportive therapy provided. Discussed psychosocial stressors in detail.   Pt encouraged to continue individual therapy We talked about processing her recent medical emergency and its consequences in detail.  Consultations: Encouraged to follow up with PCP as needed  Pt denies SI and is at an acute low risk for suicide. Patient told to call clinic if any problems occur. Patient advised to go to ER if they should develop SI/HI, side effects, or if symptoms worsen. Has crisis numbers to call if needed. Pt verbalized understanding.  F/up in 2 months or sooner if needed     , MD 02/08/2018, 1:52 PM

## 2018-04-12 ENCOUNTER — Encounter (HOSPITAL_COMMUNITY): Payer: Self-pay | Admitting: Psychiatry

## 2018-04-12 ENCOUNTER — Ambulatory Visit (INDEPENDENT_AMBULATORY_CARE_PROVIDER_SITE_OTHER): Payer: No Typology Code available for payment source | Admitting: Psychiatry

## 2018-04-12 DIAGNOSIS — F331 Major depressive disorder, recurrent, moderate: Secondary | ICD-10-CM | POA: Diagnosis not present

## 2018-04-12 DIAGNOSIS — F411 Generalized anxiety disorder: Secondary | ICD-10-CM | POA: Diagnosis not present

## 2018-04-12 MED ORDER — SERTRALINE HCL 100 MG PO TABS: 100.0000 mg | ORAL_TABLET | Freq: Every day | ORAL | 0 refills | Status: DC

## 2018-04-12 MED ORDER — BUPROPION HCL ER (XL) 150 MG PO TB24: 300.0000 mg | ORAL_TABLET | Freq: Every day | ORAL | 0 refills | Status: DC

## 2018-04-12 MED ORDER — LORAZEPAM 1 MG PO TABS: 1.0000 mg | ORAL_TABLET | Freq: Every day | ORAL | 1 refills | Status: DC | PRN

## 2018-04-12 NOTE — Progress Notes (Signed)
BH MD/PA/NP OP Progress Note  04/12/2018 10:34 AM [name]  MRN:  [id number]  Chief Complaint:  Chief Complaint    Depression; Follow-up; Medication Problem     HPI: Pt stopped the Depakote after a month because she was not feeling like herself. She was still irritable and depressed. She restarted Zoloft 100mg  despite knowing that she could suffer from serotonin syndrome. She is now feeling better. She wants to take the risk rather than suffer. Pt is no longer irritable or lashing out at her kids. Depression is better but she has always feels depressed in the winter months. Pt reports low motivation and fatigue. She is able to force herself to do what she has to. Her sleep is poor since restarting Zoloft. She wakes up at 3am but that is the time she wakes up for work. Pt has applied for a new job with a better schedule. Pt denies SI/HI.   Visit Diagnosis:    ICD-10-CM   1. Major depressive disorder, recurrent episode, moderate (HCC) F33.1 sertraline (ZOLOFT) 100 MG tablet    buPROPion (WELLBUTRIN XL) 150 MG 24 hr tablet  2. GAD (generalized anxiety disorder) F41.1 LORazepam (ATIVAN) 1 MG tablet      Past Psychiatric History:  Anxiety:Yes Bipolar Disorder:No Depression:Yes Mania:No Psychosis:No Schizophrenia:No Personality Disorder:No Hospitalization for psychiatric illness:No History of Electroconvulsive Shock Therapy:No Prior Suicide Attempts:No    Past Medical History:  Past Medical History:  Diagnosis Date  . Abnormal Pap smear of cervix   . Anxiety   . Brachydactyly of fingers   . Depression    sees 07-11-1985 PA at [facility]  . GERD (gastroesophageal reflux disease)   . Gynecological examination    sees Dr. [facility]  . HPV (human papilloma virus) infection   . Pregnancy    due August 10, 2014  . Rheumatoid arthritis(714.0)    sees Dr. August 12, 2014   . Tracheomalacia, congenital     Past Surgical History:  Procedure Laterality Date   . BACK SURGERY    . HAND SURGERY  1996   to elongate the right 3rd metacarpal   . LUMBAR FUSION  2008   per Dr. 2009 on L4-5 for spondylolisthesis   . NASAL SEPTUM SURGERY  01/01/2018    Family Psychiatric History:  Family History  Problem Relation Age of Onset  . Arthritis Mother   . Hyperlipidemia Father   . Heart disease Father   . Hypertension Father   . Arthritis Father   . Depression Father   . Anxiety disorder Father   . Arthritis Brother   . Psoriasis Brother   . Cleft lip Brother        without palate  . Uterine cancer Maternal Aunt        x 2  . Seizures Maternal Aunt   . Mental retardation Maternal Aunt        mild  . Seizures Cousin   . Depression Cousin        attempted suicide  . Suicidality Cousin   . Colon cancer Neg Hx     Social History:  Social History   Socioeconomic History  . Marital status: Legally Separated    Spouse name: Not on file  . Number of children: 1  . Years of education: Not on file  . Highest education level: Not on file  Occupational History  . Occupation: 03/03/2018: [employer]  Social Needs  . Financial resource strain: Not on file  .  Food insecurity:    Worry: Not on file    Inability: Not on file  . Transportation needs:    Medical: Not on file    Non-medical: Not on file  Tobacco Use  . Smoking status: Former Smoker    Packs/day: 0.50    Types: Cigarettes    Last attempt to quit: 08/11/2007    Years since quitting: 10.6  . Smokeless tobacco: Never Used  Substance and Sexual Activity  . Alcohol use: Yes    Alcohol/week: 3.0 standard drinks    Types: 3 Glasses of wine per week    Comment: occ  . Drug use: No  . Sexual activity: Yes    Partners: Male    Birth control/protection: None    Comment: had intercourse yesterday  Lifestyle  . Physical activity:    Days per week: Not on file    Minutes per session: Not on file  . Stress: Not on file  Relationships  . Social connections:    Talks on  phone: Not on file    Gets together: Not on file    Attends religious service: Not on file    Active member of club or organization: Not on file    Attends meetings of clubs or organizations: Not on file    Relationship status: Not on file  Other Topics Concern  . Not on file  Social History Narrative  . Not on file    Allergies:  Allergies  Allergen Reactions  . 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 Nutmeg Oil (Myristica Oil) Hives  . 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 Vicodin [Hydrocodone-Acetaminophen] Itching    Metabolic Disorder Labs: No results found for: HGBA1C, MPG No results found for: PROLACTIN No results found for: CHOL, TRIG, HDL, CHOLHDL, VLDL, LDLCALC Lab Results  Component Value Date   TSH 1.58 01/22/2016   TSH 1.53 10/20/2014    Therapeutic Level Labs: No results found for: LITHIUM No results found for: VALPROATE No components found for:  CBMZ  Current Medications: Current Outpatient Medications  Medication Sig Dispense Refill  . Adalimumab (HUMIRA PEN [place]) Inject into the skin.    Marland Kitchen buPROPion (WELLBUTRIN XL) 150 MG 24 hr tablet Take 2 tablets (300 mg total) by mouth daily. 270 tablet 0  . JUNEL FE 1/20 1-20 MG-MCG tablet Take 1 tablet by mouth daily.  7  . LORazepam (ATIVAN) 1 MG tablet Take 1 tablet (1 mg total) by mouth daily as needed for anxiety. 30 tablet 1  . Melatonin 5 MG TABS Take by mouth.    . sertraline (ZOLOFT) 100 MG tablet Take 1 tablet (100 mg total) by mouth daily. 90 tablet 0  . clindamycin (CLEOCIN) 150 MG capsule TAKE 2 CAPSULES BY MOUTH 3 TIMES DAILY FOR 7 DAYS.  0  . divalproex (DEPAKOTE ER) 500 MG 24 hr tablet Take 1 tablet (500 mg total) by mouth daily. (Patient not taking: Reported on 04/12/2018) 30 tablet 1   No current facility-administered medications for this visit.      Musculoskeletal: Strength & Muscle Tone:  normal Gait & Station: normal, normal Patient leans: straight  Psychiatric Specialty Exam: Review of Systems  Constitutional: Negative for chills, diaphoresis and fever.  Respiratory: Negative for cough, shortness of breath and wheezing.     Blood pressure 106/70, pulse 97, height 5' 0.34" (1.533 m), weight 151 lb (68.5 kg), SpO2 100 %.Body mass  index is 29.16 kg/m.  General Appearance: Casual  Eye Contact:  Good  Speech:  Clear and Coherent and Normal Rate  Volume:  Normal  Mood:  Depressed  Affect:  Full Range  Thought Process:  Goal Directed and Descriptions of Associations: Intact  Orientation:  Full (Time, Place, and Person)  Thought Content:  Logical  Suicidal Thoughts:  No  Homicidal Thoughts:  No  Memory:  Immediate;   Good  Judgement:  Fair  Insight:  Good  Psychomotor Activity:  Normal  Concentration:  Concentration: Good  Recall:  Good  Fund of Knowledge:  Good  Language:  Good  Akathisia:  No  Handed:  Right  AIMS (if indicated):     Assets:  Communication Skills Desire for Improvement Housing Social Support Transportation Vocational/Educational  ADL's:  Intact  Cognition:  WNL  Sleep:   poor            Screenings: PHQ2-9     Counselor from 06/20/2014 in BEHAVIORAL HEALTH OUTPATIENT THERAPY [place]  PHQ-2 Total Score  1      Reviewed the information below on 04/12/2018 and have updated it Assessment and Plan: GAD; MDD-recurrent, moderate; insomnia    Medication management with supportive therapy. Risks/benefits and SE of the medication discussed. Pt verbalized understanding and verbal consent obtained for treatment.  Affirm with the patient that the medications are taken as ordered. Patient expressed understanding of how their medications were to be used.   Meds: Avoid serotonin agents due to ICU treatment for potential serotonin syndrome Pt restarted Zoloft 100mg  po qD despite knowing the risk of developing serotonin syndrome.  I  shared with the patient that I was not advising her to continue treatment with medications that are known to cause serotonin syndrome.  Patient reports that she is aware of the risk and would like to continue the medication.  I gave a handout to the patient describing symptoms to monitor for and we discussed it in detail.  Patient verbalized understanding and will seek emergency care if any symptoms do develop  Wellbutrin XL 300 mg p.o. daily for depression. May consider increasing to 450mg  if needed Ativan 1mg  po qD prn anxiety- not taking as much D/c Depakote as pt does not want to take it    Labs: none  Therapy: brief supportive therapy provided. Discussed psychosocial stressors in detail.   Pt encouraged to continue individual therapy   Consultations: Encouraged to follow up with PCP as needed  Pt denies SI and is at an acute low risk for suicide. Patient told to call clinic if any problems occur. Patient advised to go to ER if they should develop SI/HI, side effects, or if symptoms worsen. Has crisis numbers to call if needed. Pt verbalized understanding.  F/up in 2 months or sooner if needed  , MD 04/12/2018, 10:34 AM
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
## 2018-06-14 ENCOUNTER — Ambulatory Visit (INDEPENDENT_AMBULATORY_CARE_PROVIDER_SITE_OTHER): Payer: BLUE CROSS/BLUE SHIELD | Admitting: Psychiatry

## 2018-06-14 ENCOUNTER — Encounter (HOSPITAL_COMMUNITY): Payer: Self-pay | Admitting: Psychiatry

## 2018-06-14 DIAGNOSIS — F331 Major depressive disorder, recurrent, moderate: Secondary | ICD-10-CM | POA: Diagnosis not present

## 2018-06-14 DIAGNOSIS — F411 Generalized anxiety disorder: Secondary | ICD-10-CM

## 2018-06-14 MED ORDER — LORAZEPAM 1 MG PO TABS: 1.0000 mg | ORAL_TABLET | Freq: Every day | ORAL | 2 refills | Status: DC | PRN

## 2018-06-14 MED ORDER — SERTRALINE HCL 100 MG PO TABS: 100.0000 mg | ORAL_TABLET | Freq: Every day | ORAL | 0 refills | Status: DC

## 2018-06-14 MED ORDER — BUPROPION HCL ER (XL) 150 MG PO TB24: 300.0000 mg | ORAL_TABLET | Freq: Every day | ORAL | 0 refills | Status: DC

## 2018-06-14 NOTE — Progress Notes (Signed)
BH MD/PA/NP OP Progress Note  06/14/2018 10:50 AM Christine GammonJacquelyn J [name]  MRN:  [id number]  Chief Complaint:  Chief Complaint    Follow-up; Medication Refill     HPI: Christine LankJacqueline tells me that she has been doing well.  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  It has been good for her as she has more regular hours and is able to spend more time with her children.  Her mood is improving and she is no longer feeling irritable.  She states that during the winter her mood typically does go down but lately she has been feeling it improve even more.  She is getting out more and is more motivated and not isolating.  Her sleep is poor due to her children sleeping in her bed.  She has anxiety but it is overall well managed.  She takes Ativan about 3 times a week usually in the late evening due to overwhelming anxiety about the things that she needs to do.  She is denying SI/HI.  She is denying any symptoms or side effects from Zoloft.  We again reviewed symptoms of serotonin syndrome and patient denied having experienced any of them.  Visit Diagnosis:    ICD-10-CM   1. Major depressive disorder, recurrent episode, moderate (HCC) F33.1 buPROPion (WELLBUTRIN XL) 150 MG 24 hr tablet    sertraline (ZOLOFT) 100 MG tablet  2. GAD (generalized anxiety disorder) F41.1 LORazepam (ATIVAN) 1 MG tablet      Past Psychiatric History:  Anxiety:Yes Bipolar Disorder:No Depression:Yes Mania:No Psychosis:No Schizophrenia:No Personality Disorder:No Hospitalization for psychiatric illness:No History of Electroconvulsive Shock Therapy:No Prior Suicide Attempts:No    Past Medical History:  Past Medical History:  Diagnosis Date  . Abnormal Pap smear of cervix   . Anxiety   . Brachydactyly of fingers   . Depression    [facility]  . GERD (gastroesophageal reflux disease)   . Gynecological examination    sees [name]  . HPV (human papilloma virus)  infection   . Pregnancy    due August 10, 2014  . Rheumatoid arthritis(714.0)    sees Dr. Azzie RoupShane [name]   . Tracheomalacia, congenital     Past Surgical History:  Procedure Laterality Date  . BACK SURGERY    . HAND SURGERY  1996   to elongate the right 3rd metacarpal   . LUMBAR FUSION  2008   per Dr. Gerlene FeeKritzer on L4-5 for spondylolisthesis   . NASAL SEPTUM SURGERY  01/01/2018    Family Psychiatric History:  Family History  Problem Relation Age of Onset  . Arthritis Mother   . Hyperlipidemia Father   . Heart disease Father   . Hypertension Father   . Arthritis Father   . Depression Father   . Anxiety disorder Father   . Arthritis Brother   . Psoriasis Brother   . Cleft lip Brother        without palate  . Uterine cancer Maternal Aunt        x 2  . Seizures Maternal Aunt   . Mental retardation Maternal Aunt        mild  . Seizures Cousin   . Depression Cousin        attempted suicide  . Suicidality Cousin   . Colon cancer Neg Hx     Social History:  Social History   Socioeconomic History  . Marital status: Legally Separated    Spouse name: Not on file  .  Number of children: 1  . Years of education: Not on file  . Highest education level: Not on file  Occupational History  . Occupation: Academic librarian: [employer]  Social Needs  . Financial resource strain: Not on file  . Food insecurity:    Worry: Not on file    Inability: Not on file  . Transportation needs:    Medical: Not on file    Non-medical: Not on file  Tobacco Use  . Smoking status: Former Smoker    Packs/day: 0.50    Types: Cigarettes    Last attempt to quit: 08/11/2007    Years since quitting: 10.8  . Smokeless tobacco: Never Used  Substance and Sexual Activity  . Alcohol use: Yes    Alcohol/week: 3.0 standard drinks    Types: 3 Glasses of wine per week    Comment: occ  . Drug use: No  . Sexual activity: Yes    Partners: Male    Birth control/protection: None    Comment: had  intercourse yesterday  Lifestyle  . Physical activity:    Days per week: Not on file    Minutes per session: Not on file  . Stress: Not on file  Relationships  . Social connections:    Talks on phone: Not on file    Gets together: Not on file    Attends religious service: Not on file    Active member of club or organization: Not on file    Attends meetings of clubs or organizations: Not on file    Relationship status: Not on file  Other Topics Concern  . Not on file  Social History Narrative  . Not on file    Allergies:  Allergies  Allergen Reactions  . 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 Nutmeg Oil (Myristica Oil) Hives  . 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 Vicodin [Hydrocodone-Acetaminophen] Itching    Metabolic Disorder Labs: No results found for: HGBA1C, MPG No results found for: PROLACTIN No results found for: CHOL, TRIG, HDL, CHOLHDL, VLDL, LDLCALC Lab Results  Component Value Date   TSH 1.58 01/22/2016   TSH 1.53 10/20/2014    Therapeutic Level Labs: No results found for: LITHIUM No results found for: VALPROATE No components found for:  CBMZ  Current Medications: Current Outpatient Medications  Medication Sig Dispense Refill  . Adalimumab (HUMIRA PEN [place]) Inject into the skin.    Marland Kitchen buPROPion (WELLBUTRIN XL) 150 MG 24 hr tablet Take 2 tablets (300 mg total) by mouth daily. 270 tablet 0  . JUNEL FE 1/20 1-20 MG-MCG tablet Take 1 tablet by mouth daily.  7  . LORazepam (ATIVAN) 1 MG tablet Take 1 tablet (1 mg total) by mouth daily as needed for anxiety. 30 tablet 2  . Melatonin 5 MG TABS Take by mouth.    . sertraline (ZOLOFT) 100 MG tablet Take 1 tablet (100 mg total) by mouth daily. 90 tablet 0   No current facility-administered medications for this visit.      Musculoskeletal: Strength & Muscle Tone: normal Gait & Station: normal, normal Patient leans:  straight    Psychiatric Specialty Exam: Review of Systems  Gastrointestinal: Negative for abdominal pain, nausea and vomiting.  Musculoskeletal: Negative for back pain, joint pain, myalgias and neck pain.    Blood pressure 122/70, height 5' 0.75" (1.543 m), weight 154 lb 12.8 oz (70.2 kg).Body mass index  is 29.49 kg/m.  General Appearance: Casual  Eye Contact:  Good  Speech:  Clear and Coherent and Normal Rate  Volume:  Normal  Mood:  Depressed  Affect:  Full Range  Thought Process:  Goal Directed and Descriptions of Associations: Intact  Orientation:  Full (Time, Place, and Person)  Thought Content:  Logical  Suicidal Thoughts:  No  Homicidal Thoughts:  No  Memory:  Immediate;   Good  Judgement:  Good  Insight:  Good  Psychomotor Activity:  Normal  Concentration:  Concentration: Good  Recall:  Good  Fund of Knowledge:  Good  Language:  Good  Akathisia:  No  Handed:  Right  AIMS (if indicated):     Assets:  Communication Skills Desire for Improvement Financial Resources/Insurance Housing Physical Health Resilience Social Support Talents/Skills Transportation Vocational/Educational  ADL's:  Intact  Cognition:  WNL  Sleep:   poor             Screenings: PHQ2-9     Counselor from 06/20/2014 in BEHAVIORAL HEALTH OUTPATIENT THERAPY [place]  PHQ-2 Total Score  1      I reviewed the information below on 06/14/2018 and have updated it Assessment and Plan: GAD; MDD-recurrent, moderate; insomnia  Status of current symptoms: Significantly improved depression and anxiety   Medication management with supportive therapy. Risks/benefits and SE of the medication discussed. Pt verbalized understanding and verbal consent obtained for treatment.  Affirm with the patient that the medications are taken as ordered. Patient expressed understanding of how their medications were to be used.   Meds: I was avoiding serotonin agents due to ICU treatment for potential  serotonin syndrome Pt restarted Zoloft 100mg  po qD on her own prior to last visit despite knowing the risk of developing serotonin syndrome.  At that time I shared with the patient that I was not advising her to continue treatment with medications that are known to cause serotonin syndrome.  Patient reports that she is aware of the risk and would like to continue the medication.  I gave a handout to the patient describing symptoms to monitor for and we discussed it in detail.  Patient verbalized understanding and will seek emergency care if any symptoms do develop -Today we again reviewed the risks of taking Zoloft and patient verbalized understanding.. She is reporting significant improvement in mood and wants to continue it.  Has improved and has a history of good response to Zoloft I will prescribe Zoloft 100mg  po qD for MDD.  -Wellbutrin XL 300 mg p.o. daily for depression. May consider increasing to 450mg  if needed - Ativan 1mg  po qD prn anxiety- not taking as much     Labs: none  Therapy: brief supportive therapy provided. Discussed psychosocial stressors in detail.   Pt encouraged to continue individual therapy   Consultations: Encouraged to follow up with PCP as needed  Pt denies SI and is at an acute low risk for suicide. Patient told to call clinic if any problems occur. Patient advised to go to ER if they should develop SI/HI, side effects, or if symptoms worsen. Has crisis numbers to call if needed. Pt verbalized understanding.  F/up in 2-3 months or sooner if needed  Oletta Darter, MD 06/14/2018, 10:50 AM

## 2018-06-21 ENCOUNTER — Ambulatory Visit: Payer: Self-pay

## 2018-06-21 ENCOUNTER — Encounter: Payer: Self-pay | Admitting: Sports Medicine

## 2018-06-21 ENCOUNTER — Ambulatory Visit (INDEPENDENT_AMBULATORY_CARE_PROVIDER_SITE_OTHER): Payer: BLUE CROSS/BLUE SHIELD | Admitting: Sports Medicine

## 2018-06-21 VITALS — BP 96/62 | HR 94 | Ht 60.75 in | Wt 157.0 lb

## 2018-06-21 DIAGNOSIS — M25511 Pain in right shoulder: Secondary | ICD-10-CM

## 2018-06-21 DIAGNOSIS — G8929 Other chronic pain: Secondary | ICD-10-CM | POA: Diagnosis not present

## 2018-06-21 NOTE — Procedures (Signed)
PROCEDURE NOTE:  Ultrasound Guided: Injection: Right shoulder, Intra-articular Images were obtained and interpreted by myself, Gaspar Bidding, DO  Images have been saved and stored to PACS system. Images obtained on: GE S7 Ultrasound machine    ULTRASOUND FINDINGS:  Right shoulder with intact biceps tendon.  Normal-appearing subscapularis with thickening of the supraspinatus.  Possible interstitial tear.  There is a small joint effusion.  Teres minor and infraspinatus tendons are normal.  No significant labral tear appreciated.  There is poor tissue mobility especially at end range.  DESCRIPTION OF PROCEDURE:  The patient's clinical condition is marked by substantial pain and/or significant functional disability. Other conservative therapy has not provided relief, is contraindicated, or not appropriate. There is a reasonable likelihood that injection will significantly improve the patient's pain and/or functional impairment.   After discussing the risks, benefits and expected outcomes of the injection and all questions were reviewed and answered, the patient wished to undergo the above named procedure.  Verbal consent was obtained.  The ultrasound was used to identify the target structure and adjacent neurovascular structures. The skin was then prepped in sterile fashion and the target structure was injected under direct visualization using sterile technique as below:  Single injection performed as below: PREP: Alcohol and Ethel Chloride APPROACH:posterior, stopcock technique, 21g 2 in. INJECTATE: 5 cc 1% lidocaine, 2 cc 0.5% Marcaine and 2 cc 40mg /mL DepoMedrol ASPIRATE: None DRESSING: Band-Aid  Post procedural instructions including recommending icing and warning signs for infection were reviewed.    This procedure was well tolerated and there were no complications.   IMPRESSION: Succesful Ultrasound Guided: Injection

## 2018-06-21 NOTE — Patient Instructions (Addendum)

## 2018-06-21 NOTE — Progress Notes (Signed)
Christine FellsMichael D. Christine Shinerigby, DO  [facility] [phone]  Christine GammonJacquelyn J [name] - 33 y.o. female MRN [id number]  Date of birth: 06/15/1985  Visit Date: June 21, 2018  PCP: Christine SalisburyFry, Christine A, MD   Referred by: Christine SalisburyFry, Christine A, MD  SUBJECTIVE:  Chief Complaint  Patient presents with  . Initial Assessment    R shoulder pain    HPI: Patient is here for several months of worsening right shoulder pain.  Has been insidious onset and described as a sharp pain rated as moderate especially with shoulder forward flexion and abduction.  It does occasionally radiate towards the deltoid.  It is alleviated by rest and gentle compression of the arm.  Tylenol, ibuprofen, ice heat and lidocaine patch have been minimally helpful.  Is occasionally waking up especially while laying on the side at night.  REVIEW OF SYSTEMS: Denies fevers, chills, recent weight gain or weight loss.  No night sweats.  Pt denies any change in bowel or bladder habits, muscle weakness, numbness or falls associated with this pain. Per HPI  HISTORY:  Prior history reviewed and updated per electronic medical record.  Patient Active Problem List   Diagnosis Date Noted  . ADHD 09/30/2016  . Right knee pain 09/06/2016  . Tachycardia 01/22/2016  . Dyspnea 02/23/2015    Followed in Pulmonary clinic/ [facility]/ [place]  - 02/23/2015  Walked RA x 3 laps @ 185 ft each stopped due to End of study,  Brisk pace, mild  sob  sats 93%    - D dimer   02/23/15 Pos 0.58 > CTa neg for PE or ILD  03/31/15 >FEV1 105%, ratio 80 and FVC 114%.  - CPST  04/09/15>  There is no evidence of exercise-induced bronchospasm or ventilatory limitations. Low OUES and O2 pulse combined with resulted low PVO2 could suggest mild circulatory limitation (possibly diastolic dysfynction).> rec Echo done 04/22/15 wnl       . Threatened preterm labor 07/05/2014  . Preterm contractions 07/05/2014  . Vacuum extraction,  delivered, current hospitalization 07/05/2014  . Preterm labor 07/03/2014  . Admitted with dehydration 06/03/2014  . Gastroenteritis 06/02/2014  . Major depressive disorder, recurrent episode, moderate (HCC) 03/11/2014  . GAD (generalized anxiety disorder) 03/11/2014  . Rheumatoid arthritis (HCC) 09/13/2013  . Depressive disorder, not elsewhere classified 07/04/2012  . Family history of cleft lip 11/14/2011  . Generalized anxiety disorder 09/20/2011  . NAUSEA AND VOMITING 12/11/2010    Qualifier: Diagnosis of  By: Orson AloeHenderson MD, Tinnie GensJeffrey     . ABDOMINAL PAIN 12/11/2010    Qualifier: Diagnosis of  By: Orson AloeHenderson MD, Tinnie GensJeffrey     . Esophageal reflux 11/17/2010  . Constipation, slow transit 11/17/2010  . Spondylosis, lumbosacral 11/17/2010  . Anxiety and depression 11/17/2010  . Esophageal dysphagia 11/17/2010  . ANXIETY 09/04/2010    Qualifier: Diagnosis of  By: Orson AloeHenderson MD, Tinnie GensJeffrey      Social History   Occupational History  . Occupation: Academic librariannurse    Employer: [employer]  Tobacco Use  . Smoking status: Former Smoker    Packs/day: 0.50    Types: Cigarettes    Last attempt to quit: 08/11/2007    Years since quitting: 10.8  . Smokeless tobacco: Never Used  Substance and Sexual Activity  . Alcohol use: Yes    Alcohol/week: 3.0 standard drinks    Types: 3 Glasses of wine per week    Comment: occ  . Drug use: No  . Sexual  activity: Yes    Partners: Male    Birth control/protection: None    Comment: had intercourse yesterday   Social History   Social History Narrative  . Not on file    OBJECTIVE:  VS:  HT:5' 0.75" (154.3 cm)   WT:157 lb (71.2 kg)  BMI:29.91    BP:96/62  HR:94bpm  TEMP: ( )  RESP:99 %   PHYSICAL EXAM: Adult female. No acute distress.  Alert and appropriate. Right shoulder is well aligned without significant deformity.  She has a painful Hawkins as well as Neer's test.  Her shoulder abduction is to 90 degrees.  Forward flexion to 120 degrees.   She has mild pain with terminal external rotation and overall good internal rotation.  Her strength is intact in all planes including empty can testing, speeds testing, O'Brien's testing, internal and external rotation.  No significant pain over the [facility] joint or clavicle.   ASSESSMENT:   1. Chronic right shoulder pain     PROCEDURES:  US Guided Injection per procedure note  PROCEDURE NOTE: THERAPEUTIC EXERCISES (37048)  Discussed the foundation of treatment for this condition is physical therapy and/or daily (5-6 days/week) therapeutic exercises, focusing on core strengthening, coordination, neuromuscular control/reeducation. 15 minutes spent for Therapeutic exercises as below and as referenced in the AVS. This included exercises focusing on stretching, strengthening, with significant focus on eccentric aspects.  Proper technique shown and discussed handout in great detail with ATC. All questions were discussed and answered.  Long term goals include an improvement in range of motion, strength, endurance as well as avoiding reinjury. Frequency of visits is one time as determined during today's office visit. Frequency of exercises to be performed is as per handout. EXERCISES REVIEWED: Codman's Exercise Scapular Stabilization PEC Stretching   PLAN:  Pertinent additional documentation may be included in corresponding procedure notes, imaging studies, problem based documentation and patient instructions.  No problem-specific Assessment & Plan notes found for this encounter.   I am concerned for potential early frozen shoulder.  Intra-articular injection provided today.  Therapeutic exercises reviewed in detail.  Home Therapeutic exercises prescribed today per procedure note.   Activity modifications and the importance of avoiding exacerbating activities (limiting pain to no more than a 4 / 10 during or following activity) recommended and discussed.  Discussed red flag symptoms that  warrant earlier emergent evaluation and patient voices understanding.   No orders of the defined types were placed in this encounter.  Lab Orders  No laboratory test(s) ordered today    Imaging Orders     [place] [facility] POCT ULTRASOUND Referral Orders  No referral(s) requested today    Return in about 4 weeks (around 07/19/2018).          Andrena Mews, DO    [facility]

## 2018-07-20 ENCOUNTER — Ambulatory Visit: Payer: BLUE CROSS/BLUE SHIELD | Admitting: Sports Medicine

## 2018-09-13 ENCOUNTER — Ambulatory Visit (HOSPITAL_COMMUNITY): Payer: BLUE CROSS/BLUE SHIELD | Admitting: Psychiatry

## 2018-09-14 ENCOUNTER — Other Ambulatory Visit (HOSPITAL_COMMUNITY): Payer: Self-pay | Admitting: Psychiatry

## 2018-09-14 DIAGNOSIS — F331 Major depressive disorder, recurrent, moderate: Secondary | ICD-10-CM

## 2018-09-20 ENCOUNTER — Ambulatory Visit (INDEPENDENT_AMBULATORY_CARE_PROVIDER_SITE_OTHER): Payer: BLUE CROSS/BLUE SHIELD | Admitting: Psychiatry

## 2018-09-20 ENCOUNTER — Other Ambulatory Visit: Payer: Self-pay

## 2018-09-20 ENCOUNTER — Encounter (HOSPITAL_COMMUNITY): Payer: Self-pay | Admitting: Psychiatry

## 2018-09-20 DIAGNOSIS — F411 Generalized anxiety disorder: Secondary | ICD-10-CM | POA: Diagnosis not present

## 2018-09-20 DIAGNOSIS — F331 Major depressive disorder, recurrent, moderate: Secondary | ICD-10-CM

## 2018-09-20 MED ORDER — BUPROPION HCL ER (XL) 150 MG PO TB24: 300.0000 mg | ORAL_TABLET | Freq: Every day | ORAL | 0 refills | Status: DC

## 2018-09-20 MED ORDER — SERTRALINE HCL 100 MG PO TABS: 150.0000 mg | ORAL_TABLET | Freq: Every day | ORAL | 0 refills | Status: DC

## 2018-09-20 MED ORDER — LORAZEPAM 1 MG PO TABS: 1.0000 mg | ORAL_TABLET | Freq: Every day | ORAL | 2 refills | Status: DC | PRN

## 2018-09-20 NOTE — Progress Notes (Signed)
Virtual Visit via Telephone Note  I connected with [name] on 09/20/18 at  9:00 AM EDT by telephone and verified that I am speaking with the correct person using two identifiers.  Location: Patient: home Provider: office   I discussed the limitations, risks, security and privacy concerns of performing an evaluation and management service by telephone and the availability of in person appointments. I also discussed with the patient that there may be a patient responsible charge related to this service. The patient expressed understanding and agreed to proceed.   History of Present Illness: "Not great". Her best friend committed suicide the last week of March. Pt finds she is not handling it well. Her sleep is poor. Pt is getting about 4-5 hrs/night.  She wakes up at 3am and is unable to fall back to sleep. [name] is tired but doesn't nap due to caring for her children. The kids will be going back to daycare starting June 1 so she will get some back to herself again. Most of the day is caring for her kids and chores. She will sometimes play basketball which helps. Other days she does nothing. Pt started work again about 2 weeks ago and likes it simply because it gets her out the house. Her depression was really bad right after her friend died. It lasted for 2-3 weeks. During this time she was crying a lot, sad, tired, overwhelmed and having passive thoughts of death. Pt was feeling lost. Her ex-husband and MIL were helping more with the kids. She has been utilizing her supports. Today her depression is ongoing but as not as severe. She is no longer having passive thoughts of death and denies SI/HI. She has been reaching to a friend when she is really down. Overall she is overwhelmed, anxious, tired and unmotivated. She has random outbursts of crying. She is drinking 2 bottles of wine/week. It helps her to "espace or relax". If alone then will drink in the afternoon. Other times she drinks at  dinner. Some days she does not drink at all. Pt takes Ativan in the middle of the night to help her relax. Her brain "won't shut off" or when she is getting irritable. Pt thinks things will improve when she can go back to the gym and kids go back to daycare.  She is denying any symptoms of serotonin syndrome.   Observations/Objective: [name] is calm and cooperative on the phone.  She was engaged in the conversation and answer questions appropriately.  Her speech is clear and coherent.  Rate was mildly slow and volume was mildly decreased.  Tone was normal.  Mood is depressed and affect is congruent.  Thought processes are linear and goal directed and overall intact.  Thought content is logical.  She denies SI/HI.  Memory and concentration were good.  Use of language and knowledge are average.  Insight and judgment are good.  I am unable to comment on physical appearance, eye contact or psychomotor activity as I was unable to physically see the patient.  Assessment and Plan: MDD-recurrent, severe without psychotic features; insomnia; GAD  -Increase Zoloft to 150 mg p.o. daily for depression and anxiety.  Patient will monitor for signs and symptoms of serotonin syndrome.  Continue Wellbutrin 300 mg p.o. daily for depression  Continue Ativan 1 mg p.o. daily as needed anxiety.  Patient is aware that this medication will not be refilled with continued alcohol use  Advised to stop alcohol use as it can cause depression symptoms  to worsen and contribute to insomnia  Patient does not want any sleep aids at this time as she does feel her stress level will decrease once the [place] open up and her children restart daycare.  Patient denies SI and is at an acute low risk for suicide.  Her positive risk factors are minimal social support, recent loss of a loved one, ongoing depression.  Her protective factors are responsibility towards children, no history of previous suicide attempts, engagement with  treatment, work.  We reviewed the crisis plan and patient verbalized understanding.  Follow Up Instructions: In 1 month or sooner if needed   I discussed the assessment and treatment plan with the patient. The patient was provided an opportunity to ask questions and all were answered. The patient agreed with the plan and demonstrated an understanding of the instructions.   The patient was advised to call back or seek an in-person evaluation if the symptoms worsen or if the condition fails to improve as anticipated.  I provided 25 minutes of non-face-to-face time during this encounter.   Oletta Darter, MD

## 2018-09-25 ENCOUNTER — Encounter: Payer: Self-pay | Admitting: Sports Medicine

## 2018-10-01 ENCOUNTER — Encounter: Payer: Self-pay | Admitting: Family Medicine

## 2018-10-01 ENCOUNTER — Other Ambulatory Visit: Payer: Self-pay

## 2018-10-01 ENCOUNTER — Ambulatory Visit (INDEPENDENT_AMBULATORY_CARE_PROVIDER_SITE_OTHER): Payer: BLUE CROSS/BLUE SHIELD | Admitting: Family Medicine

## 2018-10-01 DIAGNOSIS — J029 Acute pharyngitis, unspecified: Secondary | ICD-10-CM | POA: Diagnosis not present

## 2018-10-01 MED ORDER — CEPHALEXIN 500 MG PO CAPS: 500.0000 mg | ORAL_CAPSULE | Freq: Three times a day (TID) | ORAL | 0 refills | Status: AC

## 2018-10-01 NOTE — Progress Notes (Signed)
Subjective:    Patient ID: [name], female    DOB: 12-Nov-1985, 33 y.o.   MRN: [id number]  HPI Virtual Visit via Video Note  I connected with the patient on 10/01/18 at  9:00 AM EDT by a video enabled telemedicine application and verified that I am speaking with the correct person using two identifiers.  Location patient: home Location provider:work or home office Persons participating in the virtual visit: patient, provider  I discussed the limitations of evaluation and management by telemedicine and the availability of in person appointments. The patient expressed understanding and agreed to proceed.   HPI: Here for 3 days of a severe ST, worst on the left side. No HA or fever, no cough. Taking Ibuprofen and drinking fluids.    ROS: See pertinent positives and negatives per HPI.  Past Medical History:  Diagnosis Date  . Abnormal Pap smear of cervix   . Anxiety   . Brachydactyly of fingers   . Depression    sees Jorje Guild [facility] at [facility]  . GERD (gastroesophageal reflux disease)   . Gynecological examination    sees Dr. Henderson Cloud  . HPV (human papilloma virus) infection   . Pregnancy    due August 10, 2014  . Rheumatoid arthritis(714.0)    sees Dr. Azzie Roup   . Tracheomalacia, congenital     Past Surgical History:  Procedure Laterality Date  . BACK SURGERY    . HAND SURGERY  1996   to elongate the right 3rd metacarpal   . LUMBAR FUSION  2008   per Dr. Gerlene Fee on L4-5 for spondylolisthesis   . NASAL SEPTUM SURGERY  01/01/2018    Family History  Problem Relation Age of Onset  . Arthritis Mother   . Hyperlipidemia Father   . Heart disease Father   . Hypertension Father   . Arthritis Father   . Depression Father   . Anxiety disorder Father   . Arthritis Brother   . Psoriasis Brother   . Cleft lip Brother        without palate  . Uterine cancer Maternal Aunt        x 2  . Seizures Maternal Aunt   . Mental retardation Maternal Aunt        mild  . Seizures Cousin   . Depression Cousin        attempted suicide  . Suicidality Cousin   . Colon cancer Neg Hx      Current Outpatient Medications:  .  Adalimumab (HUMIRA PEN [place]), Inject into the skin., Disp: , Rfl:  .  buPROPion (WELLBUTRIN XL) 150 MG 24 hr tablet, Take 2 tablets (300 mg total) by mouth daily., Disp: 270 tablet, Rfl: 0 .  JUNEL FE 1/20 1-20 MG-MCG tablet, Take 1 tablet by mouth daily., Disp: , Rfl: 7 .  LORazepam (ATIVAN) 1 MG tablet, Take 1 tablet (1 mg total) by mouth daily as needed for anxiety., Disp: 30 tablet, Rfl: 2 .  Melatonin 5 MG TABS, Take by mouth., Disp: , Rfl:  .  sertraline (ZOLOFT) 100 MG tablet, Take 1.5 tablets (150 mg total) by mouth daily., Disp: 135 tablet, Rfl: 0 .  cephALEXin (KEFLEX) 500 MG capsule, Take 1 capsule (500 mg total) by mouth 3 (three) times daily for 10 days., Disp: 30 capsule, Rfl: 0  EXAM:  VITALS per patient if applicable:  GENERAL: alert, oriented, appears well and in no acute distress  HEENT: atraumatic, conjunttiva clear, no obvious abnormalities  on inspection of external nose and ears  NECK: normal movements of the head and neck  LUNGS: on inspection no signs of respiratory distress, breathing rate appears normal, no obvious gross SOB, gasping or wheezing  CV: no obvious cyanosis  MS: moves all visible extremities without noticeable abnormality  PSYCH/NEURO: pleasant and cooperative, no obvious depression or anxiety, speech and thought processing grossly intact  ASSESSMENT AND PLAN: Pharyngitis, likely streptococcal. Treat with Keflex.  Alysia Penna, MD  Discussed the following assessment and plan:  No diagnosis found.     I discussed the assessment and treatment plan with the patient. The patient was provided an opportunity to ask questions and all were answered. The patient agreed with the plan and demonstrated an understanding of the instructions.   The patient was advised to call back or seek  an in-person evaluation if the symptoms worsen or if the condition fails to improve as anticipated.     Review of Systems     Objective:   Physical Exam        Assessment & Plan:

## 2018-10-18 ENCOUNTER — Encounter (HOSPITAL_COMMUNITY): Payer: Self-pay | Admitting: Psychiatry

## 2018-10-18 ENCOUNTER — Other Ambulatory Visit: Payer: Self-pay

## 2018-10-18 ENCOUNTER — Ambulatory Visit (INDEPENDENT_AMBULATORY_CARE_PROVIDER_SITE_OTHER): Payer: BC Managed Care – PPO | Admitting: Psychiatry

## 2018-10-18 DIAGNOSIS — F331 Major depressive disorder, recurrent, moderate: Secondary | ICD-10-CM | POA: Diagnosis not present

## 2018-10-18 DIAGNOSIS — F411 Generalized anxiety disorder: Secondary | ICD-10-CM | POA: Diagnosis not present

## 2018-10-18 MED ORDER — LORAZEPAM 1 MG PO TABS: 1.0000 mg | ORAL_TABLET | Freq: Every day | ORAL | 2 refills | Status: DC | PRN

## 2018-10-18 MED ORDER — BUPROPION HCL ER (XL) 150 MG PO TB24: 300.0000 mg | ORAL_TABLET | Freq: Every day | ORAL | 0 refills | Status: DC

## 2018-10-18 MED ORDER — SERTRALINE HCL 100 MG PO TABS: 150.0000 mg | ORAL_TABLET | Freq: Every day | ORAL | 0 refills | Status: DC

## 2018-10-18 NOTE — Progress Notes (Signed)
Virtual Visit via Telephone Note  I connected with [name] on 10/18/18 at  9:45 AM EDT by telephone and verified that I am speaking with the correct person using two identifiers.  Location: Patient: home Provider: office   I discussed the limitations, risks, security and privacy concerns of performing an evaluation and management service by telephone and the availability of in person appointments. I also discussed with the patient that there may be a patient responsible charge related to this service. The patient expressed understanding and agreed to proceed.   History of Present Illness: "I have been feeling better". She has angry outbursts 1-2x/night. It is usually with her kids when they will not listen. Sleep has improved and she is getting 8 hrs. She is able to fall asleep if she wakes up to go to the bathroom. Depression is ongoing but not as bad. Pt is holding herself back from doing things. She thinks it is due to not being able to work out to get more energy. Her son is not dealing with parents divorce. Pt denies hopelessness. Pt denies SI/HI. Anxiety is better.  She is no longer having racing thoughts and is able to relax. She is no longer focused on the negative all the time. Pt is now drinking 2 glasses of wine 1-2x/week.  The increase in Zoloft has helped a lot. She wants to restart counseling in the near future to help with anger issues.    Observations/Objective: I spoke with [name] on the phone.  Pt was calm, pleasant and cooperative.  Pt was engaged in the conversation and answered questions appropriately.  Speech was clear and coherent with normal rate, tone and volume.  Mood is mild depressed and anxious, affect is congruent. Thought processes are coherent and intact.  Thought content is logical.  Pt denies SI/HI.   Pt denies auditory and visual hallucinations and did not appear to be responding to internal stimuli.  Memory and concentration are good.  Fund of  knowledge and use of language are average.  Insight and judgment are fair.  I am unable to comment on psychomotor activity, general appearance, hygiene, or eye contact as I was unable to physically see the patient on the phone.   Assessment and Plan: MDD-recurrent, severe w/o psychotic features; GAD; Insomnia  Zoloft 150mg  po qD  Wellbutrin XL 300mg  po qD  Ativan 1mg  po qD prn anxiety- taking 5 days a week  Pt has cut back on drinking and was again reminded not to take Ativan with Alcohol use. Pt verbalized understanding  Follow Up Instructions: In 2 mo or sooner if needed   I discussed the assessment and treatment plan with the patient. The patient was provided an opportunity to ask questions and all were answered. The patient agreed with the plan and demonstrated an understanding of the instructions.   The patient was advised to call back or seek an in-person evaluation if the symptoms worsen or if the condition fails to improve as anticipated.  I provided 20 minutes of non-face-to-face time during this encounter.   Charlcie Cradle, MD

## 2018-12-10 ENCOUNTER — Other Ambulatory Visit: Payer: Self-pay | Admitting: Orthopedic Surgery

## 2018-12-27 ENCOUNTER — Encounter (HOSPITAL_COMMUNITY): Payer: Self-pay | Admitting: Psychiatry

## 2018-12-27 ENCOUNTER — Ambulatory Visit (INDEPENDENT_AMBULATORY_CARE_PROVIDER_SITE_OTHER): Payer: BC Managed Care – PPO | Admitting: Psychiatry

## 2018-12-27 ENCOUNTER — Other Ambulatory Visit: Payer: Self-pay

## 2018-12-27 DIAGNOSIS — F331 Major depressive disorder, recurrent, moderate: Secondary | ICD-10-CM | POA: Diagnosis not present

## 2018-12-27 DIAGNOSIS — F411 Generalized anxiety disorder: Secondary | ICD-10-CM | POA: Diagnosis not present

## 2018-12-27 DIAGNOSIS — G4700 Insomnia, unspecified: Secondary | ICD-10-CM

## 2018-12-27 MED ORDER — FLUOXETINE HCL 20 MG PO CAPS: 20.0000 mg | ORAL_CAPSULE | Freq: Every day | ORAL | 0 refills | Status: DC

## 2018-12-27 MED ORDER — LORAZEPAM 1 MG PO TABS: 1.0000 mg | ORAL_TABLET | Freq: Every day | ORAL | 1 refills | Status: DC | PRN

## 2018-12-27 MED ORDER — BUPROPION HCL ER (XL) 150 MG PO TB24: 300.0000 mg | ORAL_TABLET | Freq: Every day | ORAL | 0 refills | Status: DC

## 2018-12-27 NOTE — Progress Notes (Addendum)
Virtual Visit via Telephone Note  I connected [name]  on 12/27/18 by telephone and verified that I am speaking with the correct person using two identifiers.  Location: Patient: home Provider: office   I discussed the limitations, risks, security and privacy concerns of performing an evaluation and management service by telephone and the availability of in person appointments. I also discussed with the patient that there may be a patient responsible charge related to this service. The patient expressed understanding and agreed to proceed.   History of Present Illness: "Not bad but not super great. I just feel like I am this lull that won't go away". Her rheumotologist did write a letter saying she could go to the gym. She has been doing so for the last 2 weeks. It helps that she has to make an appointment to go the gym. She also reports a number of financial stressors. Pt has shoulder surgery next week and concerned about the Zoloft. Pt has started decreasing Zoloft to 100mg  and wants to taper off until the procedure. As far as the depression she tells me that she is suffering from lack of motivation and fatigue. Pt is sleeping 8 hrs a night but even then feels exhausted.  It doesn't help that the coronavirus prevents her from doing much. She is no longer irritable or feeling angry with her kids. Her kids are in daycare and that helps. Pt denies SI/HI. Her anxiety is up due to stressors and upcoming surgery. When she thinks about it she automatically thinks of her previous procedure. She takes Ativan and it helps. She usually takes Ativan most days of the week.    Observations/Objective: I spoke with patient on the phone.  Pt was calm, pleasant and cooperative.  Pt was engaged in the conversation and answered questions appropriately.  Speech was clear and coherent with normal rate, tone and volume.  Mood is depressed and anxious, affect is ful. Thought processes are coherent, goal oriented  and intact.  Thought content is logical.  Pt denies SI/HI.   Pt denies auditory and visual hallucinations and did not appear to be responding to internal stimuli.  Memory and concentration are good.  Fund of knowledge and use of language are average.  Insight and judgment are fair.  I am unable to comment on psychomotor activity, general appearance, hygiene, or eye contact as I was unable to physically see the patient on the phone.  Vital signs not available since interview conducted virtually.    Assessment and Plan: MDD- recurrent, severe without psychotic features; GAD; Insomnia  D/c Zoloft  I advised the patient to stop Zoloft now   She is wondering if she should try a different SSRI because the Zoloft does not seem as effective.   Start Prozac 20mg  po qD for MDD and GAD. She will start Prozac after surgery next week  Wellbutrin XL 300mg  po qD for MDD  Ativan 1mg  po qD prn anxiety  Pt has cut back on alcohol use. She is now drinking socially instead of daily and by herself.   Follow Up Instructions: In 3 weeks or sooner if needed   I discussed the assessment and treatment plan with the patient. The patient was provided an opportunity to ask questions and all were answered. The patient agreed with the plan and demonstrated an understanding of the instructions.   The patient was advised to call back or seek an in-person evaluation if the symptoms worsen or if the condition fails to  improve as anticipated.  I provided 25 minutes of non-face-to-face time during this encounter.

## 2019-01-17 ENCOUNTER — Other Ambulatory Visit: Payer: Self-pay

## 2019-01-17 ENCOUNTER — Ambulatory Visit (INDEPENDENT_AMBULATORY_CARE_PROVIDER_SITE_OTHER): Payer: BC Managed Care – PPO | Admitting: Psychiatry

## 2019-01-17 ENCOUNTER — Encounter (HOSPITAL_COMMUNITY): Payer: Self-pay | Admitting: Psychiatry

## 2019-01-17 DIAGNOSIS — F331 Major depressive disorder, recurrent, moderate: Secondary | ICD-10-CM

## 2019-01-17 DIAGNOSIS — F411 Generalized anxiety disorder: Secondary | ICD-10-CM

## 2019-01-17 MED ORDER — LORAZEPAM 1 MG PO TABS: 1.0000 mg | ORAL_TABLET | Freq: Every day | ORAL | 1 refills | Status: DC | PRN

## 2019-01-17 MED ORDER — BUPROPION HCL ER (XL) 150 MG PO TB24: 300.0000 mg | ORAL_TABLET | Freq: Every day | ORAL | 0 refills | Status: DC

## 2019-01-17 MED ORDER — FLUOXETINE HCL 20 MG PO CAPS: 20.0000 mg | ORAL_CAPSULE | Freq: Every day | ORAL | 0 refills | Status: DC

## 2019-01-17 NOTE — Progress Notes (Signed)
  Virtual Visit via Telephone Note  I connected with [name]  on 01/17/19 at 10:45 AM EDT by telephone and verified that I am speaking with the correct person using two identifiers.  Location: Patient: home Provider: office   I discussed the limitations, risks, security and privacy concerns of performing an evaluation and management service by telephone and the availability of in person appointments. I also discussed with the patient that there may be a patient responsible charge related to this service. The patient expressed understanding and agreed to proceed.   History of Present Illness: Patient shares that she likes the prozac.  It does seem to be helping with her depression symptoms more than Zoloft.  She takes Ativan several times a week. She is sleeping well.  She denies SI/HI.  This week she is off of work but will return next week.  She likes her job and states things are going well at home.  She has no concerns at this time.    Observations/Objective: I spoke with [name] on the phone.  Pt was calm, pleasant and cooperative.  Pt was engaged in the conversation and answered questions appropriately.  Speech was clear and coherent with normal rate, tone and volume.  Mood is euthymic and affect is full. Thought processes are coherent, goal oriented and intact.  Thought content is logical.  Pt denies SI/HI.   Pt denies auditory and visual hallucinations and did not appear to be responding to internal stimuli.  Memory and concentration are good.  Fund of knowledge and use of language are average.  Insight and judgment are fair.  I am unable to comment on psychomotor activity, general appearance, hygiene, or eye contact as I was unable to physically see the patient on the phone.  Vital signs not available since interview conducted virtually.     Assessment and Plan: MDD-recurrent, severe without psychotic features; GAD; insomnia  Status of current symptoms: improved and  stable  Prozac 20 mg p.o. daily for MDD and GAD  Wellbutrin XL 300 mg p.o. daily for MDD  Ativan 1 mg p.o. daily as needed anxiety  Patient states that she only drinks 1 or 2 drinks socially and does not drink alone anymore  Follow Up Instructions: In 12 weeks or sooner if needed   I discussed the assessment and treatment plan with the patient. The patient was provided an opportunity to ask questions and all were answered. The patient agreed with the plan and demonstrated an understanding of the instructions.   The patient was advised to call back or seek an in-person evaluation if the symptoms worsen or if the condition fails to improve as anticipated.  I provided 15 minutes of non-face-to-face time during this encounter.   Charlcie Cradle, MD

## 2019-04-11 ENCOUNTER — Ambulatory Visit (HOSPITAL_COMMUNITY): Payer: BC Managed Care – PPO | Admitting: Psychiatry

## 2019-04-14 ENCOUNTER — Other Ambulatory Visit (HOSPITAL_COMMUNITY): Payer: Self-pay | Admitting: Psychiatry

## 2019-05-23 ENCOUNTER — Ambulatory Visit (INDEPENDENT_AMBULATORY_CARE_PROVIDER_SITE_OTHER): Payer: BC Managed Care – PPO | Admitting: Psychiatry

## 2019-05-23 ENCOUNTER — Encounter (HOSPITAL_COMMUNITY): Payer: Self-pay | Admitting: Psychiatry

## 2019-05-23 ENCOUNTER — Other Ambulatory Visit: Payer: Self-pay

## 2019-05-23 DIAGNOSIS — F411 Generalized anxiety disorder: Secondary | ICD-10-CM | POA: Diagnosis not present

## 2019-05-23 DIAGNOSIS — F331 Major depressive disorder, recurrent, moderate: Secondary | ICD-10-CM

## 2019-05-23 DIAGNOSIS — G4700 Insomnia, unspecified: Secondary | ICD-10-CM | POA: Diagnosis not present

## 2019-05-23 DIAGNOSIS — F332 Major depressive disorder, recurrent severe without psychotic features: Secondary | ICD-10-CM | POA: Diagnosis not present

## 2019-05-23 MED ORDER — SERTRALINE HCL 100 MG PO TABS: 100.0000 mg | ORAL_TABLET | Freq: Every day | ORAL | 0 refills | Status: DC

## 2019-05-23 MED ORDER — BUPROPION HCL ER (XL) 150 MG PO TB24: 300.0000 mg | ORAL_TABLET | Freq: Every day | ORAL | 0 refills | Status: DC

## 2019-05-23 MED ORDER — LORAZEPAM 1 MG PO TABS: 1.0000 mg | ORAL_TABLET | Freq: Every day | ORAL | 1 refills | Status: DC | PRN

## 2019-05-23 NOTE — Progress Notes (Signed)
Virtual Visit via Telephone Note  I connected with [name] on 05/23/19 at  4:15 PM EST by telephone and verified that I am speaking with the correct person using two identifiers.  Location: Patient: home Provider: office   I discussed the limitations, risks, security and privacy concerns of performing an evaluation and management service by telephone and the availability of in person appointments. I also discussed with the patient that there may be a patient responsible charge related to this service. The patient expressed understanding and agreed to proceed.   History of Present Illness: She shares that she is more anxious and depressed for the last 3 months. Her father died in 02-May-2023 due to COVID. Her boyfriend and his daughter moved in to her place in October. 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 Her kids are doing well. [name] does not really like her job and is bored. She feels sad and down most days. She is more emotional and very irritable at night. She is crying randomly a lot more. Her energy and motivation are low. [name] is spending more time in bed. [name] goes to the gym 3x/week.  She is having racing thoughts and inability to relax. She feels a constant tight feeling in her chest. Sleep is ok with 1/2 Ativan. Sleep is broken but she can fall asleep easily. [name] admits she is drinking about 1 bottle of wine in one evening  about once a week. [name] wants to get back on Zoloft. She denies SI/HI.    Observations/Objective:  General Appearance: unable to assess  Eye Contact:  unable to assess  Speech:  Clear and Coherent and Normal Rate  Volume:  Normal  Mood:  Anxious and Depressed  Affect:  Congruent, Depressed and Tearful  Thought Process:  Goal Directed, Linear and Descriptions of Associations: Intact  Orientation:  Full (Time, Place, and Person)  Thought Content:  Logical  Suicidal Thoughts:  No  Homicidal Thoughts:  No  Memory:  Immediate;    Good  Judgement:  Good  Insight:  Good  Psychomotor Activity:  unable to assess  Concentration:  Concentration: Good  Recall:  Good  Fund of Knowledge:  Good  Language:  Good  Akathisia:  unable to assess  Handed:  Right  AIMS (if indicated):     Assets:  Communication Skills Desire for Improvement Financial Resources/Insurance Housing Intimacy Transportation Vocational/Educational  ADL's:  Unable to assess  Cognition:  WNL  Sleep:        Assessment and Plan: MDD-recurrent, severe without psychotic features; GAD; Insomnia  Status of current symptoms: worsening depression and anxiety  D/c Prozac  Restart Zoloft 100mg  po qD  Wellbutrin XL 300mg  po qD  Ativan 1mg  po qD prn anxiety  We discussed her use of alcohol and it's effect on mental health.   Follow Up Instructions: In 6-8 weeks or sooner if needed   I discussed the assessment and treatment plan with the patient. The patient was provided an opportunity to ask questions and all were answered. The patient agreed with the plan and demonstrated an understanding of the instructions.   The patient was advised to call back or seek an in-person evaluation if the symptoms worsen or if the condition fails to improve as anticipated.  I provided 20 minutes of non-face-to-face time during this encounter.   , MD

## 2019-06-27 ENCOUNTER — Other Ambulatory Visit: Payer: Self-pay

## 2019-06-27 ENCOUNTER — Ambulatory Visit (INDEPENDENT_AMBULATORY_CARE_PROVIDER_SITE_OTHER): Payer: BC Managed Care – PPO | Admitting: Psychiatry

## 2019-06-27 ENCOUNTER — Encounter (HOSPITAL_COMMUNITY): Payer: Self-pay | Admitting: Psychiatry

## 2019-06-27 DIAGNOSIS — F411 Generalized anxiety disorder: Secondary | ICD-10-CM

## 2019-06-27 DIAGNOSIS — F331 Major depressive disorder, recurrent, moderate: Secondary | ICD-10-CM

## 2019-06-27 MED ORDER — LORAZEPAM 1 MG PO TABS: 1.0000 mg | ORAL_TABLET | Freq: Every day | ORAL | 1 refills | Status: DC | PRN

## 2019-06-27 NOTE — Progress Notes (Signed)
Virtual Visit via Telephone Note  I connected with [name] on 06/27/19 at  9:45 AM EST by telephone and verified that I am speaking with the correct person using two identifiers.  Location: Patient: home Provider: office   I discussed the limitations, risks, security and privacy concerns of performing an evaluation and management service by telephone and the availability of in person appointments. I also discussed with the patient that there may be a patient responsible charge related to this service. The patient expressed understanding and agreed to proceed.   History of Present Illness: Pt reports she is doing better. 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 She is engaging in weekly therapy. They are working on trauma.  Her mood is not vacillating as much. 2-3x/week at night she feels overwhelmed. Loud noises irritate her so is making an effort to watch her reaction. Her depression is present but manageable. She is no longer isolating or crying. Sleep is fair. The anxiety is a little worse due to no longer drinking alcohol and trying to find other ways to cope. Over the last 2 weeks she has taken Ativan almost every afternoon. The Zoloft is working and she doesn't want the dose changed at this time.    Observations/Objective:  General Appearance: unable to assess  Eye Contact:  unable to assess  Speech:  Clear and Coherent and Slow  Volume:  Normal  Mood:  Anxious  Affect:  Full Range  Thought Process:  Goal Directed, Linear and Descriptions of Associations: Intact  Orientation:  Full (Time, Place, and Person)  Thought Content:  Logical  Suicidal Thoughts:  No  Homicidal Thoughts:  No  Memory:  Immediate;   Good  Judgement:  Good  Insight:  Good  Psychomotor Activity: unable to assess  Concentration:  Concentration: Good  Recall:  Good  Fund of Knowledge:  Good  Language:  Good  Akathisia:  unable to assess  Handed:  Right  AIMS (if indicated):     Assets:   Communication Skills Desire for Improvement Financial Resources/Insurance Housing Physical Health Resilience Social Support Talents/Skills Transportation Vocational/Educational  ADL's:  unable to assess  Cognition:  WNL  Sleep:        I reviewed the information below on 06/27/19 and have updated it Assessment and Plan:  MDD-recurrent, severe without psychotic features; GAD; Insomnia   Status of current symptoms: depression is improved. No longer drinking alcohol.    Zoloft 100mg  po qD- no refill today   Wellbutrin XL 300mg  po qD- no refill today   Ativan 1mg  po qD prn anxiety    Follow Up Instructions: In 3 months or sooner if needed   I discussed the assessment and treatment plan with the patient. The patient was provided an opportunity to ask questions and all were answered. The patient agreed with the plan and demonstrated an understanding of the instructions.   The patient was advised to call back or seek an in-person evaluation if the symptoms worsen or if the condition fails to improve as anticipated.  I provided 15 minutes of non-face-to-face time during this encounter.   , MD

## 2019-08-29 ENCOUNTER — Ambulatory Visit (HOSPITAL_COMMUNITY): Payer: BC Managed Care – PPO | Admitting: Psychiatry

## 2019-09-05 ENCOUNTER — Other Ambulatory Visit (HOSPITAL_COMMUNITY): Payer: Self-pay | Admitting: Psychiatry

## 2019-09-05 DIAGNOSIS — F411 Generalized anxiety disorder: Secondary | ICD-10-CM

## 2019-09-05 DIAGNOSIS — F331 Major depressive disorder, recurrent, moderate: Secondary | ICD-10-CM

## 2019-09-12 ENCOUNTER — Encounter (HOSPITAL_COMMUNITY): Payer: Self-pay | Admitting: Psychiatry

## 2019-09-12 ENCOUNTER — Telehealth (INDEPENDENT_AMBULATORY_CARE_PROVIDER_SITE_OTHER): Payer: BC Managed Care – PPO | Admitting: Psychiatry

## 2019-09-12 ENCOUNTER — Other Ambulatory Visit: Payer: Self-pay

## 2019-09-12 DIAGNOSIS — F331 Major depressive disorder, recurrent, moderate: Secondary | ICD-10-CM | POA: Diagnosis not present

## 2019-09-12 DIAGNOSIS — F411 Generalized anxiety disorder: Secondary | ICD-10-CM | POA: Diagnosis not present

## 2019-09-12 MED ORDER — BUPROPION HCL ER (XL) 150 MG PO TB24: 300.0000 mg | ORAL_TABLET | Freq: Every day | ORAL | 0 refills | Status: DC

## 2019-09-12 MED ORDER — LORAZEPAM 1 MG PO TABS: 1.0000 mg | ORAL_TABLET | Freq: Every day | ORAL | 2 refills | Status: DC | PRN

## 2019-09-12 MED ORDER — SERTRALINE HCL 100 MG PO TABS: 100.0000 mg | ORAL_TABLET | Freq: Every day | ORAL | 0 refills | Status: DC

## 2019-09-12 NOTE — Progress Notes (Signed)
Virtual Visit via Telephone Note  I connected with [name] on 09/12/19 at  3:30 PM EDT by telephone and verified that I am speaking with the correct person using two identifiers.  Location: Patient: home Provider: office   I discussed the limitations, risks, security and privacy concerns of performing an evaluation and management service by telephone and the availability of in person appointments. I also discussed with the patient that there may be a patient responsible charge related to this service. The patient expressed understanding and agreed to proceed.   History of Present Illness: Overall she is ok. She did have some episodes of depression related to pain. She is getting new meds tomorrow. The depression is manageable. Her boyfriend moved in and it is going well. She is going to be transitioning to a new job next month. She is able to fall asleep fine but if she wakes up in the middle of the night then she can't sleep again. Energy is low. She denies SI/HI. Her anxiety is "neutral". She denies any alcohol use since Feb 2021.   Observations/Objective:  General Appearance: unable to assess  Eye Contact:  unable to assess  Speech:  Clear and Coherent and Normal Rate  Volume:  Normal  Mood:  Euthymic  Affect:  Full Range  Thought Process:  Goal Directed, Linear and Descriptions of Associations: Intact  Orientation:  Full (Time, Place, and Person)  Thought Content:  Logical  Suicidal Thoughts:  No  Homicidal Thoughts:  No  Memory:  Immediate;   Good  Judgement:  Good  Insight:  Good  Psychomotor Activity: unable to assess  Concentration:  Concentration: Good  Recall:  Good  Fund of Knowledge:  Good  Language:  Good  Akathisia:  unable to assess  Handed:  Right  AIMS (if indicated):     Assets:  Communication Skills Desire for Improvement Financial Resources/Insurance Housing Intimacy Leisure Time Resilience Social  Support Talents/Skills Transportation Vocational/Educational  ADL's:  unable to assess  Cognition:  WNL  Sleep:        I reviewed the information below on 09/12/19 and have updated it Assessment and Plan: MDD-recurrent, severe without psychotic features; GAD; Insomnia   Status of current symptoms: depression is improved. No longer drinking alcohol.    Zoloft 100mg  po qD- no refill today   Wellbutrin XL 300mg  po qD- no refill today   Ativan 1mg  po qD prn anxiety    Follow Up Instructions: In 2-3 mo or sooner if needed   I discussed the assessment and treatment plan with the patient. The patient was provided an opportunity to ask questions and all were answered. The patient agreed with the plan and demonstrated an understanding of the instructions.   The patient was advised to call back or seek an in-person evaluation if the symptoms worsen or if the condition fails to improve as anticipated.  I provided 15 minutes of non-face-to-face time during this encounter.   , MD

## 2019-11-06 MED FILL — BLISOVI FE 1/20 1-20 MG-MCG: 1-20 | 84 days supply | Qty: 112 | Fill #0

## 2019-11-08 ENCOUNTER — Other Ambulatory Visit: Payer: Self-pay

## 2019-11-08 ENCOUNTER — Ambulatory Visit (HOSPITAL_BASED_OUTPATIENT_CLINIC_OR_DEPARTMENT_OTHER): Payer: BC Managed Care – PPO | Admitting: Pharmacist

## 2019-11-08 DIAGNOSIS — Z79899 Other long term (current) drug therapy: Secondary | ICD-10-CM

## 2019-11-08 MED ORDER — CIMZIA PREFILLED 2 X 200 MG/ML ~~LOC~~ KIT: PACK | SUBCUTANEOUS | 6 refills | Status: DC

## 2019-11-08 NOTE — Progress Notes (Signed)
   S: Patient presents today for review of their specialty medication.   Patient is currently taking Cimzia (certolizumab) for RA. Patient is managed by Azucena Fallen for this. She has completed her loading dose.   Dosing: Rheumatoid arthritis: SubQ: Initial: 400 mg, repeat dose 2 and 4 weeks after initial dose; Maintenance: 200 mg every other week. May consider maintenance dose of 400 mg every 4 weeks. May be administered alone or in combination with methotrexate.  Adherence: confirms   Efficacy: she just completed her loading dose, however, she can already tell some improvement.   Current adverse effects: denies   O:  Lab Results  Component Value Date   WBC 8.3 01/22/2016   HGB 13.6 01/22/2016   HCT 40.2 01/22/2016   MCV 89.2 01/22/2016   PLT 249.0 01/22/2016      Chemistry      Component Value Date/Time   NA 141 01/22/2016 1038   K 4.3 01/22/2016 1038   CL 104 01/22/2016 1038   CO2 30 01/22/2016 1038   BUN 16 01/22/2016 1038   CREATININE 0.90 01/22/2016 1038      Component Value Date/Time   CALCIUM 9.3 01/22/2016 1038   ALKPHOS 76 01/22/2016 1038   AST 16 01/22/2016 1038   ALT 11 01/22/2016 1038   BILITOT 0.5 01/22/2016 1038       A/P: 1. Medication review: patient currently prescribed Cimzia for RA and is tolerating it well. Reviewed the medication with the patient, including the following: Cimzia is a TNF blocking agent used in the treatment of ankylosing spondylitis, axial spondyloarthritis, Crohn disease, plaque psoriasis, psoriatic arthritis, and rheumatoid arthritis. The medication should be room temp prior to administration an should be administered subcutaneously. Rotate injection sites and do not inject into damaged skin or into moles or scars. Possible adverse effects include GI upset, antibody development, increased risk of infection, hypersensitivity, demyelinating CNS disease, hematologic effects, and increased risk of malignancy. Use with caution in  patients with heart failure. Avoid use of live vaccinations and let doctor know of any infections as treatment with Cimzia may need to be held during illness. No recommendations for any changes.  Butch Penny, PharmD, CPP Clinical Pharmacist [facility] [phone]

## 2019-11-11 DIAGNOSIS — F419 Anxiety disorder, unspecified: Secondary | ICD-10-CM | POA: Diagnosis not present

## 2019-11-20 MED FILL — CIMZIA 200 MG/ML SYRN KIT: 2 X 200 | 28 days supply | Qty: 1 | Fill #0

## 2019-12-05 DIAGNOSIS — Z6834 Body mass index (BMI) 34.0-34.9, adult: Secondary | ICD-10-CM | POA: Diagnosis not present

## 2019-12-05 DIAGNOSIS — E669 Obesity, unspecified: Secondary | ICD-10-CM | POA: Diagnosis not present

## 2019-12-05 DIAGNOSIS — M0609 Rheumatoid arthritis without rheumatoid factor, multiple sites: Secondary | ICD-10-CM | POA: Diagnosis not present

## 2019-12-05 DIAGNOSIS — M2550 Pain in unspecified joint: Secondary | ICD-10-CM | POA: Diagnosis not present

## 2019-12-12 ENCOUNTER — Other Ambulatory Visit: Payer: Self-pay

## 2019-12-12 ENCOUNTER — Telehealth (INDEPENDENT_AMBULATORY_CARE_PROVIDER_SITE_OTHER): Payer: 59 | Admitting: Psychiatry

## 2019-12-12 DIAGNOSIS — G4700 Insomnia, unspecified: Secondary | ICD-10-CM | POA: Diagnosis not present

## 2019-12-12 DIAGNOSIS — F411 Generalized anxiety disorder: Secondary | ICD-10-CM

## 2019-12-12 DIAGNOSIS — F331 Major depressive disorder, recurrent, moderate: Secondary | ICD-10-CM | POA: Diagnosis not present

## 2019-12-12 MED ORDER — BUPROPION HCL ER (XL) 150 MG PO TB24: 300.0000 mg | ORAL_TABLET | Freq: Every day | ORAL | 0 refills | Status: DC

## 2019-12-12 MED ORDER — LORAZEPAM 1 MG PO TABS: 1.0000 mg | ORAL_TABLET | Freq: Every day | ORAL | 2 refills | Status: DC | PRN

## 2019-12-12 MED ORDER — BUSPIRONE HCL 10 MG PO TABS: 10.0000 mg | ORAL_TABLET | Freq: Two times a day (BID) | ORAL | 0 refills | Status: DC

## 2019-12-12 MED FILL — LORazepam 1 MG TABS: 1 | 30 days supply | Qty: 30 | Fill #0

## 2019-12-12 MED FILL — busPIRone HCL 10 MG TABS: 10 | 90 days supply | Qty: 180 | Fill #0

## 2019-12-12 MED FILL — buPROPion HCL ER (XL) 150 M: 150 | 90 days supply | Qty: 180 | Fill #0

## 2019-12-12 NOTE — Progress Notes (Signed)
Virtual Visit via Telephone Note  I connected with [name] on 12/12/19 at 10:30 AM EDT by telephone and verified that I am speaking with the correct person using two identifiers.  Location: Patient: home Provider: office   I discussed the limitations, risks, security and privacy concerns of performing an evaluation and management service by telephone and the availability of in person appointments. I also discussed with the patient that there may be a patient responsible charge related to this service. The patient expressed understanding and agreed to proceed.   History of Present Illness: [name] shares that she has been feeling depressed.  She is noticing that she is more withdrawn and isolating.  She feels hopeless at times.  She has passive thoughts of death but denies SI/HI.  She does not think that the Zoloft is working anymore.  She states that her anxiety is mild and she notices it mostly at nighttime.  Despite this her sleep is good with Ativan.  In the past she has tried Associate Professor and stated that it was helpful and she would like to try it again.   Observations/Objective:  General Appearance: unable to assess  Eye Contact:  unable to assess  Speech:  Clear and Coherent and Normal Rate  Volume:  Normal  Mood:  Depressed  Affect:  Congruent  Thought Process:  Goal Directed, Linear and Descriptions of Associations: Intact  Orientation:  Full (Time, Place, and Person)  Thought Content:  Logical  Suicidal Thoughts:  No  Homicidal Thoughts:  No  Memory:  Immediate;   Good  Judgement:  Good  Insight:  Good  Psychomotor Activity: unable to assess  Concentration:  Concentration: Good  Recall:  Good  Fund of Knowledge:  Good  Language:  Good  Akathisia:  unable to assess  Handed:  Right  AIMS (if indicated):     Assets:  Communication Skills Desire for Improvement Financial Resources/Insurance Housing Social  Support Talents/Skills Transportation Vocational/Educational  ADL's:  unable to assess  Cognition:  WNL  Sleep:         Assessment and Plan:  MDD- recurrent, severe without psychotic features; GAD; Insomnia  D/c Zoloft  Wellbutrin XL 300mg  po qD  Ativan 1mg  po qD prn anxiety  Start trial of Buspar 10mg  po BID- reports benefit in the past   Follow Up Instructions: In 2 months or sooner if needed   I discussed the assessment and treatment plan with the patient. The patient was provided an opportunity to ask questions and all were answered. The patient agreed with the plan and demonstrated an understanding of the instructions.   The patient was advised to call back or seek an in-person evaluation if the symptoms worsen or if the condition fails to improve as anticipated.  I provided 15  minutes of non-face-to-face time during this encounter.   , MD

## 2019-12-19 ENCOUNTER — Other Ambulatory Visit: Payer: Self-pay | Admitting: Family Medicine

## 2019-12-19 ENCOUNTER — Other Ambulatory Visit: Payer: Self-pay

## 2019-12-19 ENCOUNTER — Encounter: Payer: Self-pay | Admitting: Family Medicine

## 2019-12-19 ENCOUNTER — Ambulatory Visit: Payer: 59 | Admitting: Family Medicine

## 2019-12-19 VITALS — BP 110/60 | HR 86 | Temp 98.0°F | Wt 177.0 lb

## 2019-12-19 DIAGNOSIS — R110 Nausea: Secondary | ICD-10-CM

## 2019-12-19 MED ORDER — ONDANSETRON HCL 8 MG PO TABS: 8.0000 mg | ORAL_TABLET | Freq: Three times a day (TID) | ORAL | 1 refills | Status: DC | PRN

## 2019-12-19 MED FILL — ONDANSETRON HCL 8 MG TABLET: 8 | 20 days supply | Qty: 60 | Fill #0

## 2019-12-19 NOTE — Progress Notes (Signed)
   Subjective:    Patient ID: [name], female    DOB: Mar 15, 1986, 34 y.o.   MRN: [id number]  HPI Here for 2 months of intermittent episodes of nausea without vomiting and feeling faint. These occur almost every day, usually once or twice a day. They can occur at any time of the day. No abdominal pains. She does say she has had to urinate more frequently the past year. She has taken 2 OTC pregnancy tests, one a month ago and one last night, both of which were negative. She has been taking continuous birth control for the past 2 years. During these spells she has tried eating something and this does not help.    Review of Systems  Constitutional: Negative.   Respiratory: Negative.   Cardiovascular: Negative.   Gastrointestinal: Positive for nausea. Negative for abdominal distention, abdominal pain, anal bleeding, blood in stool, constipation, diarrhea, rectal pain and vomiting.  Endocrine: Negative.   Genitourinary: Positive for frequency. Negative for dysuria.  Neurological: Positive for light-headedness. Negative for dizziness and headaches.       Objective:   Physical Exam Constitutional:      General: She is not in acute distress.    Appearance: She is obese.  Cardiovascular:     Rate and Rhythm: Normal rate and regular rhythm.     Pulses: Normal pulses.     Heart sounds: Normal heart sounds.  Pulmonary:     Effort: Pulmonary effort is normal.     Breath sounds: Normal breath sounds.  Abdominal:     General: Abdomen is flat. Bowel sounds are normal. There is no distension.     Palpations: Abdomen is soft. There is no mass.     Tenderness: There is no abdominal tenderness. There is no guarding or rebound.     Hernia: No hernia is present.  Neurological:     General: No focal deficit present.     Mental Status: She is alert and oriented to person, place, and time.           Assessment & Plan:  Urinary frequency with spells of nausea and faintness. We will get  labs today to try to figure this out.  Gershon Crane, MD

## 2019-12-20 ENCOUNTER — Encounter: Payer: Self-pay | Admitting: Family Medicine

## 2019-12-20 LAB — CBC WITH DIFFERENTIAL/PLATELET
Absolute Monocytes: 660 cells/uL (ref 200–950)
Basophils Absolute: 26 cells/uL (ref 0–200)
Basophils Relative: 0.3 %
Eosinophils Absolute: 141 cells/uL (ref 15–500)
Eosinophils Relative: 1.6 %
HCT: 38.5 % (ref 35.0–45.0)
Hemoglobin: 12.8 g/dL (ref 11.7–15.5)
Lymphs Abs: 3106 cells/uL (ref 850–3900)
MCH: 30.7 pg (ref 27.0–33.0)
MCHC: 33.2 g/dL (ref 32.0–36.0)
MCV: 92.3 fL (ref 80.0–100.0)
MPV: 10.5 fL (ref 7.5–12.5)
Monocytes Relative: 7.5 %
Neutro Abs: 4866 cells/uL (ref 1500–7800)
Neutrophils Relative %: 55.3 %
Platelets: 245 10*3/uL (ref 140–400)
RBC: 4.17 10*6/uL (ref 3.80–5.10)
RDW: 12.3 % (ref 11.0–15.0)
Total Lymphocyte: 35.3 %
WBC: 8.8 10*3/uL (ref 3.8–10.8)

## 2019-12-20 LAB — BASIC METABOLIC PANEL
BUN: 16 mg/dL (ref 7–25)
CO2: 28 mmol/L (ref 20–32)
Calcium: 9.4 mg/dL (ref 8.6–10.2)
Chloride: 105 mmol/L (ref 98–110)
Creat: 0.87 mg/dL (ref 0.50–1.10)
Glucose, Bld: 94 mg/dL (ref 65–99)
Potassium: 3.9 mmol/L (ref 3.5–5.3)
Sodium: 140 mmol/L (ref 135–146)

## 2019-12-20 LAB — HEPATIC FUNCTION PANEL
AG Ratio: 1.9 (calc) (ref 1.0–2.5)
ALT: 16 U/L (ref 6–29)
AST: 19 U/L (ref 10–30)
Albumin: 4.3 g/dL (ref 3.6–5.1)
Alkaline phosphatase (APISO): 49 U/L (ref 31–125)
Bilirubin, Direct: 0 mg/dL (ref 0.0–0.2)
Globulin: 2.3 g/dL (calc) (ref 1.9–3.7)
Indirect Bilirubin: 0.3 mg/dL (calc) (ref 0.2–1.2)
Total Bilirubin: 0.3 mg/dL (ref 0.2–1.2)
Total Protein: 6.6 g/dL (ref 6.1–8.1)

## 2019-12-20 LAB — HEMOGLOBIN A1C
Hgb A1c MFr Bld: 5.3 % of total Hgb (ref <5.7)
Mean Plasma Glucose: 105 (calc)
eAG (mmol/L): 5.8 (calc)

## 2019-12-20 LAB — T4, FREE: Free T4: 1.2 ng/dL (ref 0.8–1.8)

## 2019-12-20 LAB — URINALYSIS
Bilirubin Urine: NEGATIVE
Glucose, UA: NEGATIVE
Hgb urine dipstick: NEGATIVE
Ketones, ur: NEGATIVE
Nitrite: NEGATIVE
Protein, ur: NEGATIVE
Specific Gravity, Urine: 1.012 (ref 1.001–1.03)
pH: 5.5 (ref 5.0–8.0)

## 2019-12-20 LAB — TSH: TSH: 3.52 mIU/L

## 2019-12-20 LAB — VITAMIN B12: Vitamin B-12: 323 pg/mL (ref 200–1100)

## 2019-12-20 LAB — T3, FREE: T3, Free: 4 pg/mL (ref 2.3–4.2)

## 2019-12-23 NOTE — Telephone Encounter (Signed)
I agree the TSH has gone up quickly, but everything is still in the normal range. We can watch this closely for awhile. Let's repeat a thyroid panel in 6 months

## 2019-12-25 ENCOUNTER — Other Ambulatory Visit: Payer: Self-pay | Admitting: Sleep Medicine

## 2019-12-25 ENCOUNTER — Other Ambulatory Visit: Payer: Self-pay

## 2019-12-25 DIAGNOSIS — I471 Supraventricular tachycardia: Secondary | ICD-10-CM | POA: Diagnosis not present

## 2019-12-26 LAB — NOVEL CORONAVIRUS, NAA: SARS-CoV-2, NAA: NOT DETECTED

## 2019-12-26 LAB — SPECIMEN STATUS REPORT

## 2020-01-09 ENCOUNTER — Ambulatory Visit (HOSPITAL_BASED_OUTPATIENT_CLINIC_OR_DEPARTMENT_OTHER): Payer: 59 | Admitting: Pharmacist

## 2020-01-09 ENCOUNTER — Other Ambulatory Visit: Payer: Self-pay | Admitting: Internal Medicine

## 2020-01-09 ENCOUNTER — Other Ambulatory Visit: Payer: Self-pay

## 2020-01-09 DIAGNOSIS — Z79899 Other long term (current) drug therapy: Secondary | ICD-10-CM

## 2020-01-09 MED ORDER — XELJANZ XR 11 MG PO TB24: ORAL_TABLET | ORAL | 3 refills | Status: DC

## 2020-01-09 NOTE — Progress Notes (Signed)
S: Patient presents for review of their specialty medication therapy.  Patient is currently taking Xeljanz for RA. Patient is managed by Zenovia Jordan for this.   Adherence: has not yet started   Efficacy: has not yet started  Dosing:  Rheumatoid arthritis (monotherapy or in combination with nonbiologic disease-modifying antirheumatic drugs (DMARDs): Oral: Extended release: 11 mg once daily  Dosing: Renal Impairment  Mild impairment: No dosage adjustment necessary. Moderate to severe impairment: Reduce dose to 5 mg (immediate release) once daily. A supplemental dose after dialysis is not necessary in patients with severe impairment on dialysis. Note: Tofacitinib has not been studied in patients with baseline CrCl <40 mL/minute. Dosing: Hepatic Impairment  Mild impairment: No dosage adjustment necessary. Moderate impairment: Reduce dose to 5 mg (immediate release) once daily. Severe impairment: Use is not recommended (has not been studied in patients with severe hepatic impairment or in patients with hepatitis B or hepatitis C viruses).   Dosing: Adjustment for Toxicity  Lymphopenia (lymphocytes ?500 cells/mm3): Maintain dose. Lymphopenia (lymphocytes <500 cells/mm3) confirmed by repeat evaluation: Discontinue therapy. Neutropenia (ANC >1,000 cells/mm3): Maintain dose. Neutropenia (ANC persistently between 500 to 1,000 cells/mm3): Interrupt therapy; resume at 5 mg immediate release twice daily or 11 mg extended release once daily when ANC >1,000 cells/mm3. Neutropenia (ANC <500 cells/mm3) confirmed by repeat evaluation: Discontinue therapy. Anemia (hemoglobin ?9 g/dL and decrease ?2 g/dL): Maintain dose. Anemia (hemoglobin <8 g/dL or decrease >2 g/dL) confirmed by repeat evaluation: Interrupt therapy until hemoglobin values have normalized.    Monitoring: S/sx of infection: none  S/sx of malignancy: none Bone marrow suppression: none GI adverse effects: none Headache: none    LFTs: WNL 12/19/19 Cardiovascular effects (decreased HR): none  S/sx blood clots: none    O:     Lab Results  Component Value Date   WBC 8.8 12/19/2019   HGB 12.8 12/19/2019   HCT 38.5 12/19/2019   MCV 92.3 12/19/2019   PLT 245 12/19/2019      Chemistry      Component Value Date/Time   NA 140 12/19/2019 0946   K 3.9 12/19/2019 0946   CL 105 12/19/2019 0946   CO2 28 12/19/2019 0946   BUN 16 12/19/2019 0946   CREATININE 0.87 12/19/2019 0946      Component Value Date/Time   CALCIUM 9.4 12/19/2019 0946   ALKPHOS 76 01/22/2016 1038   AST 19 12/19/2019 0946   ALT 16 12/19/2019 0946   BILITOT 0.3 12/19/2019 0946       A/P: 1. Medication review: patient currently prescribed Xeljanz for RA. Reviewed the medications with the patient including the following: Xeljanz (tofacitinib) is a Janus Associated Kinase Inhibitor which prevents cytokine- or growth factor-medicated gene expression and intracellular activity of immune cells, reduces circulating CD16/56+ natural killer cells, serum IgG, IgM, IgA, and C-reactive protein, and increases B cells. Patient educated on purpose, proper use and potential adverse effects of Xeljanz.  Adverse effects include increased risk of infection, risk of malignancy, as well as bone marrow suppression, GI upset, and decreased heart rate. Patient should avoid live vaccinations while on this medication. The FDA has issued a safety alert warning in Sept, 2021 about an increased risk of blood clots and death with Harriette Ohara. New warnings about these risks, including revision to current Boxed Warnings, have been added to the [facility] X[facility] prescribing information. Recommend pt to f/u with her specialist regarding these risks.   Butch Penny, PharmD, CPP Clinical Pharmacist [facility]  [phone]       

## 2020-01-10 MED FILL — XELJANZ XR 11 MG TB24: 11 | 30 days supply | Qty: 30 | Fill #0

## 2020-01-13 MED FILL — LORazepam 1 MG TABS: 1 | 30 days supply | Qty: 30 | Fill #1

## 2020-01-14 DIAGNOSIS — F419 Anxiety disorder, unspecified: Secondary | ICD-10-CM | POA: Diagnosis not present

## 2020-01-23 ENCOUNTER — Ambulatory Visit (INDEPENDENT_AMBULATORY_CARE_PROVIDER_SITE_OTHER): Payer: 59 | Admitting: Family Medicine

## 2020-01-23 ENCOUNTER — Encounter (INDEPENDENT_AMBULATORY_CARE_PROVIDER_SITE_OTHER): Payer: Self-pay | Admitting: Family Medicine

## 2020-01-23 ENCOUNTER — Other Ambulatory Visit: Payer: Self-pay

## 2020-01-23 ENCOUNTER — Telehealth (INDEPENDENT_AMBULATORY_CARE_PROVIDER_SITE_OTHER): Payer: 59 | Admitting: Psychiatry

## 2020-01-23 ENCOUNTER — Other Ambulatory Visit (HOSPITAL_COMMUNITY): Payer: Self-pay | Admitting: Psychiatry

## 2020-01-23 VITALS — BP 108/74 | HR 82 | Temp 98.2°F | Ht 60.0 in | Wt 171.0 lb

## 2020-01-23 DIAGNOSIS — E669 Obesity, unspecified: Secondary | ICD-10-CM | POA: Diagnosis not present

## 2020-01-23 DIAGNOSIS — Z6833 Body mass index (BMI) 33.0-33.9, adult: Secondary | ICD-10-CM

## 2020-01-23 DIAGNOSIS — E66811 Obesity, class 1: Secondary | ICD-10-CM

## 2020-01-23 DIAGNOSIS — G4700 Insomnia, unspecified: Secondary | ICD-10-CM

## 2020-01-23 DIAGNOSIS — R5383 Other fatigue: Secondary | ICD-10-CM | POA: Diagnosis not present

## 2020-01-23 DIAGNOSIS — Z0289 Encounter for other administrative examinations: Secondary | ICD-10-CM

## 2020-01-23 DIAGNOSIS — F331 Major depressive disorder, recurrent, moderate: Secondary | ICD-10-CM | POA: Diagnosis not present

## 2020-01-23 DIAGNOSIS — Z9189 Other specified personal risk factors, not elsewhere classified: Secondary | ICD-10-CM | POA: Diagnosis not present

## 2020-01-23 DIAGNOSIS — R0602 Shortness of breath: Secondary | ICD-10-CM | POA: Diagnosis not present

## 2020-01-23 DIAGNOSIS — M069 Rheumatoid arthritis, unspecified: Secondary | ICD-10-CM | POA: Diagnosis not present

## 2020-01-23 DIAGNOSIS — F411 Generalized anxiety disorder: Secondary | ICD-10-CM | POA: Diagnosis not present

## 2020-01-23 DIAGNOSIS — F3289 Other specified depressive episodes: Secondary | ICD-10-CM | POA: Diagnosis not present

## 2020-01-23 DIAGNOSIS — Z862 Personal history of diseases of the blood and blood-forming organs and certain disorders involving the immune mechanism: Secondary | ICD-10-CM

## 2020-01-23 MED ORDER — BUSPIRONE HCL 15 MG PO TABS: 15.0000 mg | ORAL_TABLET | Freq: Two times a day (BID) | ORAL | 0 refills | Status: DC

## 2020-01-23 MED ORDER — BUPROPION HCL ER (XL) 150 MG PO TB24: 300.0000 mg | ORAL_TABLET | Freq: Every day | ORAL | 0 refills | Status: DC

## 2020-01-23 MED ORDER — LORAZEPAM 1 MG PO TABS: 1.0000 mg | ORAL_TABLET | Freq: Every day | ORAL | 2 refills | Status: DC | PRN

## 2020-01-23 MED FILL — BUSPIRONE HCL 15 MG TABS: 15 | 90 days supply | Qty: 180 | Fill #0

## 2020-01-23 NOTE — Progress Notes (Signed)
Chief Complaint:   OBESITY [name] (MR# [id number]) is a 34 y.o. female who presents for evaluation and treatment of obesity and related comorbidities. Current BMI is Body mass index is 33.4 kg/m. [name] has been struggling with her weight for many years and has been unsuccessful in either losing weight, maintaining weight loss, or reaching her healthy weight goal.  [name] is currently in the action stage of change and ready to dedicate time achieving and maintaining a healthier weight. [name] is interested in becoming our patient and working on intensive lifestyle modifications including (but not limited to) diet and exercise for weight loss.  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  She lives with her partner, [name], and their 3 children, ages 37, 38, and 5.  She eats out 4 days per week or so.  Skips 1 meal 5 days per week.  [name]'s habits were reviewed today and are as follows: Her family eats meals together, she struggles with family and or coworkers weight loss sabotage, her desired weight loss is 40 pounds, she started gaining weight over the last 7 years, her heaviest weight ever was 177 pounds, she is a picky eater and doesn't like to eat healthier foods, she craves carbs - pasta, pizza, tacos, banana with peanut butter, sushi, tomatoes with mozzarella and vinaigrette, she snacks frequently in the evenings, she wakes up frequently in the middle of the night to eat, she skips meals frequently, she frequently makes poor food choices, she has problems with excessive hunger, she frequently eats larger portions than normal and she struggles with emotional eating.  Depression Screen [name]'s Food and Mood (modified PHQ-9) score was 22.  Depression screen PHQ 2/9 01/23/2020  Decreased Interest 2  Down, Depressed, Hopeless 3  PHQ - 2 Score 5  Altered sleeping 1  Tired, decreased energy 3  Change in appetite 2  Feeling bad or  failure about yourself  3  Trouble concentrating 3  Moving slowly or fidgety/restless 1  Suicidal thoughts 2  PHQ-9 Score 20  Some encounter information is confidential and restricted. Go to Review Flowsheets activity to see all data.   Subjective:   1. Other fatigue [name] admits to daytime somnolence and reports waking up still tired. Patent has a history of symptoms of daytime fatigue, morning fatigue and morning headache. [name] generally gets 6-8 hours of sleep per night, and states that she has poor quality sleep. Snoring is not present. Apneic episodes are not present. Epworth Sleepiness Score is 8.  2. Shortness of breath on exertion [name] notes increasing shortness of breath with exercising and seems to be worsening over time with weight gain. She notes getting out of breath sooner with activity than she used to. This has gotten worse recently. [name] denies shortness of breath at rest or orthopnea.  3. Rheumatoid arthritis involving multiple sites, unspecified whether rheumatoid factor present [name]) She is on [place].  She was on Humira prior.  She sees Dr. Anson [place] for treatment.  4. History of anemia She is not on an iron supplement.  Occurred with pregnancy.  CBC Latest Ref Rng & Units 12/19/2019 01/22/2016 11/01/2015  WBC 3.8 - 10.8 Thousand/uL 8.8 8.3 10.5  Hemoglobin 11.7 - 15.5 g/dL 10.9 32.3 55.7  Hematocrit 35 - 45 % 38.5 40.2 39.8  Platelets 140 - 400 Thousand/uL 245 249.0 258   Lab Results  Component Value Date   FERRITIN 31.7 10/20/2014  Lab Results  Component Value Date   VITAMINB12 323 12/19/2019   5. Other depression with emotional eating With anxiety and stress.  PHQ-9 is 22.  She is on Wellbutrin, Buspar, lorazepam, melatonin.  Medication management through Dr. Michae Kava.  She sees a counselor every 2 weeks.    6. 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  This is the patient's first visit at [facility].  The patient's NEW PATIENT PACKET that they filled out prior to today's office visit was reviewed at length and some information from that paperwork was also included within the following office visit note.    Included in the packet: current and past health history, medications, allergies, ROS, gynecologic history (women only), surgical history, family history, social history, weight history, weight loss surgery history (for those that have had weight loss surgery), nutritional evaluation, mood and food questionnaire along with a depression screening (PHQ9) on all patients, an Epworth questionnaire, sleep habits questionnaire, patient life and health improvement goals questionnaire. These will all be scanned into the patient's chart under media.   During the visit, I independently reviewed the patient's EKG, bioimpedance scale results, and indirect calorimeter results. I used this information to tailor a meal plan for the patient that will help [name] to lose weight and will improve her obesity-related conditions going forward. I performed a medically necessary appropriate examination and/or evaluation. I discussed the assessment and treatment plan with the patient. The patient was provided an opportunity to ask questions and all were answered. The patient agreed with the plan and demonstrated an understanding of the instructions. Labs were ordered at this visit and will be reviewed at the next visit unless more critical results need to be addressed immediately. Clinical information was updated and documented in the EMR.   Time spent on visit including pre-visit chart review and post-visit care was estimated to be 60-74 minutes.  A separate 15 minutes was spent on risk counseling (see above/below).   Assessment/Plan:   1. Other fatigue Mallary does feel that her weight is  causing her energy to be lower than it should be. Fatigue may be related to obesity, depression or many other causes. Labs will be ordered, and in the meanwhile, Myrissa will focus on self care including making healthy food choices, increasing physical activity and focusing on stress reduction.  - EKG 12-Lead - Folate - Insulin, random - VITAMIN D 25 Hydroxy (Vit-D Deficiency, Fractures)  2. Shortness of breath on exertion Jianni does feel that she gets out of breath more easily that she used to when she exercises. Ravenne's shortness of breath appears to be obesity related and exercise induced. She has agreed to work on weight loss and gradually increase exercise to treat her exercise induced shortness of breath. Will continue to monitor closely.  - Insulin, random - Lipid Panel With LDL/HDL Ratio  3. Rheumatoid arthritis involving multiple sites, unspecified whether rheumatoid factor present [facility]) Management per Rheumatology, cut discussed with her the importance of movement daily in keeping symptoms well controlled.  4. History of anemia CBC is stable upon labs done around 1 month ago.  5. Other depression with emotional eating Patient was referred to Dr. Dewaine Conger, our Bariatric Psychologist, for evaluation due to her elevated PHQ-9 score and significant struggles with emotional eating.  Continue to see counselor every 2  weeks and psychiatrist for medication management.  Add 20 minutes of walking outdoors and/or meditation for stress management.  6. At risk for malnutrition Halayna was given approximately 15 minutes of counseling today regarding prevention of malnutrition and ways to meet macronutrient goals..   7. Class 1 obesity with serious comorbidity and body mass index (BMI) of 33.0 to 33.9 in adult, unspecified obesity type  Paislea is currently in the action stage of change and her goal is to continue with weight loss efforts. I recommend Lashonna begin the structured  treatment plan as follows:  She has agreed to the Category 1 Plan.  Exercise goals: Try to do 20 minutes or so everyday of movement for stress management.   Behavioral modification strategies: increasing lean protein intake, decreasing simple carbohydrates, decreasing eating out, no skipping meals and planning for success.  She was informed of the importance of frequent follow-up visits to maximize her success with intensive lifestyle modifications for her multiple health conditions. She was informed we would discuss her lab results at her next visit unless there is a critical issue that needs to be addressed sooner. Kathleena agreed to keep her next visit at the agreed upon time to discuss these results.  Objective:   Blood pressure 108/74, pulse 82, temperature 98.2 F (36.8 C), height 5' (1.524 m), weight 171 lb (77.6 kg), SpO2 99 %. Body mass index is 33.4 kg/m.  EKG: Normal sinus rhythm, rate 67 bpm.  Indirect Calorimeter completed today shows a VO2 of 339 and a REE of 2360.  Her calculated basal metabolic rate is 1610 thus her basal metabolic rate is better than expected.  General: Cooperative, alert, well developed, in no acute distress. HEENT: Conjunctivae and lids unremarkable. Cardiovascular: Regular rhythm.  Lungs: Normal work of breathing. Neurologic: No focal deficits.   Lab Results  Component Value Date   CREATININE 0.87 12/19/2019   BUN 16 12/19/2019   NA 140 12/19/2019   K 3.9 12/19/2019   CL 105 12/19/2019   CO2 28 12/19/2019   Lab Results  Component Value Date   ALT 16 12/19/2019   AST 19 12/19/2019   ALKPHOS 76 01/22/2016   BILITOT 0.3 12/19/2019   Lab Results  Component Value Date   HGBA1C 5.3 12/19/2019   Lab Results  Component Value Date   TSH 3.52 12/19/2019   Lab Results  Component Value Date   WBC 8.8 12/19/2019   HGB 12.8 12/19/2019   HCT 38.5 12/19/2019   MCV 92.3 12/19/2019   PLT 245 12/19/2019   Lab Results  Component Value Date     FERRITIN 31.7 10/20/2014   Attestation Statements:   Reviewed by clinician on day of visit: allergies, medications, problem list, medical history, surgical history, family history, social history, and previous encounter notes.  I, Insurance claims handler, CMA, am acting as Energy manager for [employer], DO.  I have reviewed the above documentation for accuracy and completeness, and I agree with the above. Thomasene Lot, DO

## 2020-01-23 NOTE — Progress Notes (Signed)
Virtual Visit via Telephone Note  I connected with [name] on 01/23/20 at 10:00 AM EDT by telephone and verified that I am speaking with the correct person using two identifiers.  Location: Patient: home Provider: office   I discussed the limitations, risks, security and privacy concerns of performing an evaluation and management service by telephone and the availability of in person appointments. I also discussed with the patient that there may be a patient responsible charge related to this service. The patient expressed understanding and agreed to proceed.   History of Present Illness: [name] states she is doing well. She has emotions now since being off Zoloft. With Zoloft she was feeling like a "zombie". [name] states her depression is better. She is more motivated and energy. [name] is interested in doing things. Her sleep is ok. When she was depressed she was sleeping too much but not anymore. She is feeling more anxious. Her stress tolerance is low and she get angry easily. It makes her frustrated because she can't control it. She will throw things but denies physically harming anyone. An episode lasts for 15-60 min to cool down. She will take Ativan and it helps but she doesn't want to have to take it.    Observations/Objective:  General Appearance: unable to assess  Eye Contact:  unable to assess  Speech:  Clear and Coherent and Normal Rate  Volume:  Normal  Mood:  Anxious  Affect:  Full Range  Thought Process:  Goal Directed, Linear and Descriptions of Associations: Intact  Orientation:  Full (Time, Place, and Person)  Thought Content:  Logical  Suicidal Thoughts:  No  Homicidal Thoughts:  No  Memory:  Immediate;   Good  Judgement:  Good  Insight:  Good  Psychomotor Activity: unable to assess  Concentration:  Concentration: Good  Recall:  Good  Fund of Knowledge:  Good  Language:  Good  Akathisia:  unable to assess  Handed:  Right  AIMS (if indicated):      Assets:  Communication Skills Desire for Improvement Financial Resources/Insurance Housing Resilience Social Support Talents/Skills Transportation Vocational/Educational  ADL's:  unable to assess  Cognition:  WNL  Sleep:         Assessment and Plan: GAD; MDD- recurrent, severe without psychotic features; Insomnia  Wellbutrin XL 300mg  po qD  Ativan 1mg  po qD prn anxiety  Increase Buspar 15mg  po BID    Follow Up Instructions: In 2-3 months or sooner if needed   I discussed the assessment and treatment plan with the patient. The patient was provided an opportunity to ask questions and all were answered. The patient agreed with the plan and demonstrated an understanding of the instructions.   The patient was advised to call back or seek an in-person evaluation if the symptoms worsen or if the condition fails to improve as anticipated.  I provided 15 minutes of non-face-to-face time during this encounter.   , MD

## 2020-01-24 LAB — FOLATE: Folate: 10.3 ng/mL (ref >3.0)

## 2020-01-24 LAB — LIPID PANEL WITH LDL/HDL RATIO
Cholesterol, Total: 226 mg/dL — ABNORMAL HIGH (ref 100–199)
HDL: 68 mg/dL (ref >39)
LDL Chol Calc (NIH): 140 mg/dL — ABNORMAL HIGH (ref 0–99)
LDL/HDL Ratio: 2.1 ratio (ref 0.0–3.2)
Triglycerides: 104 mg/dL (ref 0–149)
VLDL Cholesterol Cal: 18 mg/dL (ref 5–40)

## 2020-01-24 LAB — INSULIN, RANDOM: INSULIN: 8.5 u[IU]/mL (ref 2.6–24.9)

## 2020-01-24 LAB — VITAMIN D 25 HYDROXY (VIT D DEFICIENCY, FRACTURES): Vit D, 25-Hydroxy: 38.7 ng/mL (ref 30.0–100.0)

## 2020-01-29 DIAGNOSIS — F419 Anxiety disorder, unspecified: Secondary | ICD-10-CM | POA: Diagnosis not present

## 2020-02-06 ENCOUNTER — Ambulatory Visit (INDEPENDENT_AMBULATORY_CARE_PROVIDER_SITE_OTHER): Payer: 59 | Admitting: Family Medicine

## 2020-02-06 ENCOUNTER — Encounter (INDEPENDENT_AMBULATORY_CARE_PROVIDER_SITE_OTHER): Payer: Self-pay | Admitting: Family Medicine

## 2020-02-06 ENCOUNTER — Other Ambulatory Visit (INDEPENDENT_AMBULATORY_CARE_PROVIDER_SITE_OTHER): Payer: Self-pay | Admitting: Family Medicine

## 2020-02-06 ENCOUNTER — Other Ambulatory Visit: Payer: Self-pay

## 2020-02-06 VITALS — BP 95/63 | HR 68 | Temp 98.0°F | Ht 60.0 in | Wt 166.0 lb

## 2020-02-06 DIAGNOSIS — E8881 Metabolic syndrome: Secondary | ICD-10-CM | POA: Diagnosis not present

## 2020-02-06 DIAGNOSIS — E7849 Other hyperlipidemia: Secondary | ICD-10-CM | POA: Diagnosis not present

## 2020-02-06 DIAGNOSIS — Z9189 Other specified personal risk factors, not elsewhere classified: Secondary | ICD-10-CM

## 2020-02-06 DIAGNOSIS — E669 Obesity, unspecified: Secondary | ICD-10-CM

## 2020-02-06 DIAGNOSIS — E785 Hyperlipidemia, unspecified: Secondary | ICD-10-CM

## 2020-02-06 DIAGNOSIS — Z6834 Body mass index (BMI) 34.0-34.9, adult: Secondary | ICD-10-CM | POA: Diagnosis not present

## 2020-02-06 DIAGNOSIS — M2550 Pain in unspecified joint: Secondary | ICD-10-CM | POA: Diagnosis not present

## 2020-02-06 DIAGNOSIS — F3289 Other specified depressive episodes: Secondary | ICD-10-CM

## 2020-02-06 DIAGNOSIS — E88819 Insulin resistance, unspecified: Secondary | ICD-10-CM

## 2020-02-06 DIAGNOSIS — Z6832 Body mass index (BMI) 32.0-32.9, adult: Secondary | ICD-10-CM

## 2020-02-06 DIAGNOSIS — E66811 Obesity, class 1: Secondary | ICD-10-CM

## 2020-02-06 DIAGNOSIS — E559 Vitamin D deficiency, unspecified: Secondary | ICD-10-CM | POA: Diagnosis not present

## 2020-02-06 DIAGNOSIS — M0609 Rheumatoid arthritis without rheumatoid factor, multiple sites: Secondary | ICD-10-CM | POA: Diagnosis not present

## 2020-02-06 MED ORDER — VITAMIN D (ERGOCALCIFEROL) 1.25 MG (50000 UNIT) PO CAPS: 50000.0000 [IU] | ORAL_CAPSULE | ORAL | 0 refills | Status: DC

## 2020-02-06 MED FILL — XELJANZ XR 11 MG TB24: 11 | 30 days supply | Qty: 30 | Fill #1

## 2020-02-06 MED FILL — VIT D2 1.25 MG (50,000 UNIT: 1.25 MG | 28 days supply | Qty: 4 | Fill #0

## 2020-02-07 DIAGNOSIS — F419 Anxiety disorder, unspecified: Secondary | ICD-10-CM | POA: Diagnosis not present

## 2020-02-07 MED FILL — BLISOVI FE 1/20 1-20 MG-MCG: 1-20 | 84 days supply | Qty: 112 | Fill #1

## 2020-02-08 DIAGNOSIS — B373 Candidiasis of vulva and vagina: Secondary | ICD-10-CM | POA: Diagnosis not present

## 2020-02-08 DIAGNOSIS — N760 Acute vaginitis: Secondary | ICD-10-CM | POA: Diagnosis not present

## 2020-02-11 NOTE — Progress Notes (Signed)
Chief Complaint:   OBESITY Eveny is here to discuss her progress with her obesity treatment plan along with follow-up of her obesity related diagnoses. Mariel is on the Category 1 Plan and states she is following her eating plan approximately 75% of the time. Asimina states she is using the elliptical for 30 minutes 2-3 times per week.  Today's visit was #: 2 Starting weight: 171 lbs Starting date: 01/23/2020 Today's weight: 166 lbs Today's date: 02/06/2020 Total lbs lost to date: 5 lbs Total lbs lost since last in-office visit: 5 lbs  Interim History:  Patient is here today to review NEW Meal PLAN and to discuss all recent labs done here and/ or done at outside facilities.  Extended time was spent counseling ARACELLI KOZAN on all new disease processes that were discovered or that are worsening.   Talliyah says there were a couple of days where she followed the plan completely.    Hunger is controlled.    She had some cravings at night for salty foods.    She drinks 36 ounces of water per day.  She was recently started on spironolactone for acne by per PCP.    She measured her foods.    She will be leaving to go to [facility] for a week with her family.  I had an extensive discussion with her regarding strategies.       Assessment/Plan:   1. Vitamin D deficiency New. Discussed labs with patient today.  Adiyah has a history of Vitamin D deficiency with resultant generalized fatigue as her primary symptom.  she is taking no vitamin D supplement for this deficiency and tolerating it well without side-effect.   Most recent Vitamin D lab reviewed-  level: 38.7.  Plan:   - Discussed importance of vitamin D (as well as calcium) to their health and well-being.   - We reviewed possible symptoms of low Vitamin D including low energy, depressed mood, muscle aches, joint aches, osteoporosis etc.  - We discussed that low Vitamin D levels may be linked to an increased  risk of cardiovascular events and even increased risk of cancers- such as colon and breast.   - Educated pt that weight loss will likely improve availability of vitamin D, thus encouraged Ali to continue with meal plan and their weight loss efforts to further improve this condition  - I recommend pt take a weekly prescription vit D- see script below- which pt agrees to after discussion of risks and benefits of this medication.      - Informed patient this may be a lifelong thing, and she was encouraged to continue to take the medicine until pt told otherwise.   We will need to monitor levels regularly ( q 3-4 mo on average )  to keep levels within normal limits.   - All pt's questions and concerns regarding this condition addressed  - Start Vitamin D, Ergocalciferol, (DRISDOL) 1.25 MG (50000 UNIT) CAPS capsule; Take 1 capsule (50,000 Units total) by mouth every 7 (seven) days.  Dispense: 4 capsule; Refill: 0   2. Other hyperlipidemia Discussed labs with patient today.  Chayna has hyperlipidemia and has been trying to improve her cholesterol levels with intensive lifestyle modification including a low saturated fat diet, exercise and weight loss. She denies any chest pain, claudication or myalgias.  Elevated LDL but also HDL >60.  She is not on a statin.  Cardiovascular risk and specific lipid/LDL goals reviewed.  We discussed several lifestyle modifications  today and Yasmine will continue to work on diet, exercise and weight loss efforts. Orders and follow up as documented in patient record.  Continue prudent nutritional plan and weight loss.  Counseling Intensive lifestyle modifications are the first line treatment for this issue. . Dietary changes: Increase soluble fiber. Decrease simple carbohydrates. . Exercise changes: Moderate to vigorous-intensity aerobic activity 150 minutes per week if tolerated. . Lipid-lowering medications: see documented in medical record.  Lab Results    Component Value Date   ALT 16 12/19/2019   AST 19 12/19/2019   ALKPHOS 76 01/22/2016   BILITOT 0.3 12/19/2019   Lab Results  Component Value Date   CHOL 226 (H) 01/23/2020   HDL 68 01/23/2020   LDLCALC 140 (H) 01/23/2020   TRIG 104 01/23/2020     3. Insulin resistance New.    Discussed labs with patient today.    Gabryela has a diagnosis of insulin resistance based on her elevated fasting insulin level >5. She continues to work on diet and exercise to decrease her risk of diabetes.  She is on no medications at this time.  Cynde will continue to work on weight loss, exercise, and decreasing simple carbohydrates to help decrease the risk of diabetes. Jayanna agreed to follow-up with [place] as directed to closely monitor her progress.  Continue prudent nutritional plan and weight loss.   Lab Results  Component Value Date   INSULIN 8.5 01/23/2020   Lab Results  Component Value Date   HGBA1C 5.3 12/19/2019      4. Other depression with emotional eating Discussed labs with patient today.  NO SI    Struggling with EE/ emotional driven actions of self destruction  Plan:  USE Calm app,  Do Mindfulness Series QD and also another meditation of her choice QD.   She has seen her counselor since the last office visit.  She has been going weekly for now.    She sees her psychiatrist every 3 months.    Continue exercise.    5. At risk for impaired metabolic function Due to Veleta's current state of health and medical condition(s), they are at a significantly higher risk for impaired metabolic function.   This further also puts the patient at much greater risk to also subsequently develop cardiopulmonary conditions that can negatively affect patient's quality of life as well.  At least 24 minutes was spent on counseling Mikia about these concerns today and I stressed the importance of reversing these risks factors.   Initial goal is to lose at least 5-10% of starting  weight to help reduce risk factors.   Counseling: Intensive lifestyle modifications discussed with Lorinda as most appropriate first line treatment.  she will continue to work on diet, exercise and weight loss efforts.  We will continue to reassess these conditions on a fairly regular basis in an attempt to decrease patient's overall morbidity and mortality.     6. Class 1 obesity with serious comorbidity and body mass index (BMI) of 32.0 to 32.9 in adult, unspecified obesity type  Shayleigh is currently in the action stage of change. As such, her goal is to continue with weight loss efforts. She has agreed to the Category 1 Plan.   Exercise goals: For substantial health benefits, adults should do at least 150 minutes (2 hours and 30 minutes) a week of moderate-intensity, or 75 minutes (1 hour and 15 minutes) a week of vigorous-intensity aerobic physical activity, or an equivalent combination of moderate- and vigorous-intensity aerobic activity.  Aerobic activity should be performed in episodes of at least 10 minutes, and preferably, it should be spread throughout the week.  Behavioral modification strategies: increasing lean protein intake, decreasing simple carbohydrates, decreasing eating out, no skipping meals, meal planning and cooking strategies, emotional eating strategies, travel eating strategies and planning for success.  Amoni has agreed to follow-up with our clinic in 2-3 weeks. She was informed of the importance of frequent follow-up visits to maximize her success with intensive lifestyle modifications for her multiple health conditions.    Objective:   Blood pressure 95/63, pulse 68, temperature 98 F (36.7 C), height 5' (1.524 m), weight 166 lb (75.3 kg), SpO2 98 %. Body mass index is 32.42 kg/m.  General: Cooperative, alert, well developed, in no acute distress. HEENT: Conjunctivae and lids unremarkable. Cardiovascular: Regular rhythm.  Lungs: Normal work of  breathing. Neurologic: No focal deficits.   Lab Results  Component Value Date   CREATININE 0.87 12/19/2019   BUN 16 12/19/2019   NA 140 12/19/2019   K 3.9 12/19/2019   CL 105 12/19/2019   CO2 28 12/19/2019   Lab Results  Component Value Date   ALT 16 12/19/2019   AST 19 12/19/2019   ALKPHOS 76 01/22/2016   BILITOT 0.3 12/19/2019   Lab Results  Component Value Date   HGBA1C 5.3 12/19/2019   Lab Results  Component Value Date   INSULIN 8.5 01/23/2020   Lab Results  Component Value Date   TSH 3.52 12/19/2019   Lab Results  Component Value Date   CHOL 226 (H) 01/23/2020   HDL 68 01/23/2020   LDLCALC 140 (H) 01/23/2020   TRIG 104 01/23/2020   Lab Results  Component Value Date   WBC 8.8 12/19/2019   HGB 12.8 12/19/2019   HCT 38.5 12/19/2019   MCV 92.3 12/19/2019   PLT 245 12/19/2019   Lab Results  Component Value Date   FERRITIN 31.7 10/20/2014   Attestation Statements:   Reviewed by clinician on day of visit: allergies, medications, problem list, medical history, surgical history, family history, social history, and previous encounter notes.  I, Insurance claims handler, CMA, am acting as Energy manager for [employer], DO.  I have reviewed the above documentation for accuracy and completeness, and I agree with the above. [name], D.O.  The 21st Century Cures Act was signed into law in 2016 which includes the topic of electronic health records.  This provides immediate access to information in MyChart.  This includes consultation notes, operative notes, office notes, lab results and pathology reports.  If you have any questions about what you read please let us know at your next visit so we can discuss your concerns and take corrective action if need be.  We are right here with you.

## 2020-02-18 DIAGNOSIS — F419 Anxiety disorder, unspecified: Secondary | ICD-10-CM | POA: Diagnosis not present

## 2020-02-18 MED FILL — LORazepam 1 MG TABS: 1 | 30 days supply | Qty: 30 | Fill #2

## 2020-02-26 ENCOUNTER — Encounter (INDEPENDENT_AMBULATORY_CARE_PROVIDER_SITE_OTHER): Payer: Self-pay | Admitting: Family Medicine

## 2020-02-26 ENCOUNTER — Ambulatory Visit (INDEPENDENT_AMBULATORY_CARE_PROVIDER_SITE_OTHER): Payer: 59 | Admitting: Family Medicine

## 2020-02-26 ENCOUNTER — Other Ambulatory Visit (INDEPENDENT_AMBULATORY_CARE_PROVIDER_SITE_OTHER): Payer: Self-pay | Admitting: Family Medicine

## 2020-02-26 ENCOUNTER — Other Ambulatory Visit: Payer: Self-pay

## 2020-02-26 VITALS — BP 99/65 | HR 79 | Temp 98.2°F | Ht 60.0 in | Wt 163.0 lb

## 2020-02-26 DIAGNOSIS — F3289 Other specified depressive episodes: Secondary | ICD-10-CM | POA: Diagnosis not present

## 2020-02-26 DIAGNOSIS — E669 Obesity, unspecified: Secondary | ICD-10-CM | POA: Diagnosis not present

## 2020-02-26 DIAGNOSIS — Z6832 Body mass index (BMI) 32.0-32.9, adult: Secondary | ICD-10-CM

## 2020-02-26 DIAGNOSIS — Z9189 Other specified personal risk factors, not elsewhere classified: Secondary | ICD-10-CM | POA: Diagnosis not present

## 2020-02-26 DIAGNOSIS — E559 Vitamin D deficiency, unspecified: Secondary | ICD-10-CM

## 2020-02-26 MED ORDER — VITAMIN D (ERGOCALCIFEROL) 1.25 MG (50000 UNIT) PO CAPS: 50000.0000 [IU] | ORAL_CAPSULE | ORAL | 0 refills | Status: DC

## 2020-02-27 DIAGNOSIS — F32A Depression, unspecified: Secondary | ICD-10-CM | POA: Insufficient documentation

## 2020-02-27 DIAGNOSIS — Z9189 Other specified personal risk factors, not elsewhere classified: Secondary | ICD-10-CM | POA: Insufficient documentation

## 2020-02-27 DIAGNOSIS — E559 Vitamin D deficiency, unspecified: Secondary | ICD-10-CM | POA: Insufficient documentation

## 2020-02-27 HISTORY — DX: Vitamin D deficiency, unspecified: E55.9

## 2020-02-27 HISTORY — DX: Other specified personal risk factors, not elsewhere classified: Z91.89

## 2020-02-27 NOTE — Progress Notes (Signed)
Chief Complaint:   OBESITY [name] is here to discuss her progress with her obesity treatment plan along with follow-up of her obesity related diagnoses. [name] is on the Category 1 Plan and states she is following her eating plan approximately 75% of the time. [name] states she is using the elliptical for 30 minutes 3 times per week.  Today's visit was #: 3 Starting weight: 171 lbs Starting date: 01/23/2020 Today's weight: 163 lbs Today's date: 02/26/2020 Total lbs lost to date: 8 lbs Total lbs lost since last in-office visit: 3 lbs Total weight loss percentage to date: -4.68%  Interim History: [name] was at [facility] for a week with family, and the week she got back, she got back into the gym and followed the plan 75% of the time.  She feels that the increased walking on vacation and being more mindful of what she ate helped her be successful.  Assessment/Plan:   1. Vitamin D deficiency [name]'s Vitamin D level was 38.7 on 01/23/2020. She is currently taking prescription vitamin D 50,000 IU each week. She denies nausea, vomiting or muscle weakness.  Tolerating well without side effects.  No change in energy levels per se, but feeling well as of late.  Plan:  Continue vitamin D 50,000 IU weekly.  Will refill today, as per below.  Check vitamin D level in 3-4 months.  -Refill Vitamin D, Ergocalciferol, (DRISDOL) 1.25 MG (50000 UNIT) CAPS capsule; Take 1 capsule (50,000 Units total) by mouth every 7 (seven) days.  Dispense: 4 capsule; Refill: 0  2. Other depression with emotional eating She was advised at last office visit to download the Calm app or do another meditation of her choice daily for mindful eating.  She did not do so yet.  She says that her mood is stable recently.  Denies concerns today.  Plan:  Continue with Psychiatry and medications per their recommendations.  Advise to continue with counseling weekly for CBT.  Mindful eating workbook, meditation, and  exercise daily.  3. At risk for diabetes mellitus - [name] was given extensive diabetes prevention education and counseling today of more than 10 minutes.  - Counseled patient on pathophysiology of disease and discussed various treatment options which always includes dietary and lifestyle modification as first line.   - Importance of healthy diet with very limited amounts of simple carbohydrates discussed with patient in addition to regular aerobic exercise to an eventual goal of 5d/week or more.  - Handouts provided at patient's desire and or told to go online at the American Diabetes Association website for further information  4. Class 1 obesity with serious comorbidity and body mass index (BMI) of 32.0 to 32.9 in adult, unspecified obesity type  [name] is currently in the action stage of change. As such, her goal is to continue with weight loss efforts. She has agreed to the Category 1 Plan.   Exercise goals: Goal is to increase to 30 minutes 5 days per week.  Behavioral modification strategies: increasing lean protein intake, decreasing simple carbohydrates, meal planning and cooking strategies, keeping healthy foods in the home and emotional eating strategies.  [name] has agreed to follow-up with our clinic in 2 weeks. She was informed of the importance of frequent follow-up visits to maximize her success with intensive lifestyle modifications for her multiple health conditions.   Objective:   Blood pressure 99/65, pulse 79, temperature 98.2 F (36.8 C), height 5' (1.524 m), weight 163 lb (73.9 kg), SpO2 97 %. Body  mass index is 31.83 kg/m.  General: Cooperative, alert, well developed, in no acute distress. HEENT: Conjunctivae and lids unremarkable. Cardiovascular: Regular rhythm.  Lungs: Normal work of breathing. Neurologic: No focal deficits.   Lab Results  Component Value Date   CREATININE 0.87 12/19/2019   BUN 16 12/19/2019   NA 140 12/19/2019   K 3.9  12/19/2019   CL 105 12/19/2019   CO2 28 12/19/2019   Lab Results  Component Value Date   ALT 16 12/19/2019   AST 19 12/19/2019   ALKPHOS 76 01/22/2016   BILITOT 0.3 12/19/2019   Lab Results  Component Value Date   HGBA1C 5.3 12/19/2019   Lab Results  Component Value Date   INSULIN 8.5 01/23/2020   Lab Results  Component Value Date   TSH 3.52 12/19/2019   Lab Results  Component Value Date   CHOL 226 (H) 01/23/2020   HDL 68 01/23/2020   LDLCALC 140 (H) 01/23/2020   TRIG 104 01/23/2020   Lab Results  Component Value Date   WBC 8.8 12/19/2019   HGB 12.8 12/19/2019   HCT 38.5 12/19/2019   MCV 92.3 12/19/2019   PLT 245 12/19/2019   Lab Results  Component Value Date   FERRITIN 31.7 10/20/2014   Attestation Statements:   Reviewed by clinician on day of visit: allergies, medications, problem list, medical history, surgical history, family history, social history, and previous encounter notes.  I, Insurance claims handler, CMA, am acting as Energy manager for [employer], DO.  I have reviewed the above documentation for accuracy and completeness, and I agree with the above. [name], D.O.  The 21st Century Cures Act was signed into law in 2016 which includes the topic of electronic health records.  This provides immediate access to information in MyChart.  This includes consultation notes, operative notes, office notes, lab results and pathology reports.  If you have any questions about what you read please let us know at your next visit so we can discuss your concerns and take corrective action if need be.  We are right here with you.

## 2020-02-28 DIAGNOSIS — F419 Anxiety disorder, unspecified: Secondary | ICD-10-CM | POA: Diagnosis not present

## 2020-02-28 MED FILL — VIT D2 1.25 MG (50,000 UNIT: 1.25 MG | 28 days supply | Qty: 4 | Fill #0

## 2020-03-02 ENCOUNTER — Other Ambulatory Visit (HOSPITAL_COMMUNITY): Payer: Self-pay | Admitting: Obstetrics and Gynecology

## 2020-03-02 DIAGNOSIS — N393 Stress incontinence (female) (male): Secondary | ICD-10-CM | POA: Diagnosis not present

## 2020-03-02 DIAGNOSIS — Z01419 Encounter for gynecological examination (general) (routine) without abnormal findings: Secondary | ICD-10-CM | POA: Diagnosis not present

## 2020-03-02 DIAGNOSIS — Z309 Encounter for contraceptive management, unspecified: Secondary | ICD-10-CM | POA: Diagnosis not present

## 2020-03-03 MED FILL — ONDANSETRON HCL 8 MG TABLET: 8 | 20 days supply | Qty: 60 | Fill #0

## 2020-03-09 MED FILL — XELJANZ XR 11 MG TB24: 11 | 30 days supply | Qty: 30 | Fill #2

## 2020-03-10 DIAGNOSIS — M6281 Muscle weakness (generalized): Secondary | ICD-10-CM | POA: Diagnosis not present

## 2020-03-10 DIAGNOSIS — R350 Frequency of micturition: Secondary | ICD-10-CM | POA: Diagnosis not present

## 2020-03-10 DIAGNOSIS — N393 Stress incontinence (female) (male): Secondary | ICD-10-CM | POA: Diagnosis not present

## 2020-03-10 DIAGNOSIS — M62838 Other muscle spasm: Secondary | ICD-10-CM | POA: Diagnosis not present

## 2020-03-11 ENCOUNTER — Other Ambulatory Visit: Payer: Self-pay

## 2020-03-11 ENCOUNTER — Encounter (INDEPENDENT_AMBULATORY_CARE_PROVIDER_SITE_OTHER): Payer: Self-pay | Admitting: Family Medicine

## 2020-03-11 ENCOUNTER — Ambulatory Visit (INDEPENDENT_AMBULATORY_CARE_PROVIDER_SITE_OTHER): Payer: 59 | Admitting: Family Medicine

## 2020-03-11 VITALS — BP 108/69 | Temp 98.2°F | Ht 60.0 in | Wt 165.0 lb

## 2020-03-11 DIAGNOSIS — Z6832 Body mass index (BMI) 32.0-32.9, adult: Secondary | ICD-10-CM

## 2020-03-11 DIAGNOSIS — Z9189 Other specified personal risk factors, not elsewhere classified: Secondary | ICD-10-CM

## 2020-03-11 DIAGNOSIS — F3289 Other specified depressive episodes: Secondary | ICD-10-CM | POA: Diagnosis not present

## 2020-03-11 DIAGNOSIS — E559 Vitamin D deficiency, unspecified: Secondary | ICD-10-CM | POA: Diagnosis not present

## 2020-03-11 DIAGNOSIS — E669 Obesity, unspecified: Secondary | ICD-10-CM | POA: Diagnosis not present

## 2020-03-11 DIAGNOSIS — E66811 Obesity, class 1: Secondary | ICD-10-CM

## 2020-03-11 HISTORY — DX: Other specified personal risk factors, not elsewhere classified: Z91.89

## 2020-03-11 MED ORDER — VITAMIN D (ERGOCALCIFEROL) 1.25 MG (50000 UNIT) PO CAPS: 50000.0000 [IU] | ORAL_CAPSULE | ORAL | 0 refills | Status: DC

## 2020-03-12 DIAGNOSIS — F419 Anxiety disorder, unspecified: Secondary | ICD-10-CM | POA: Diagnosis not present

## 2020-03-17 DIAGNOSIS — F331 Major depressive disorder, recurrent, moderate: Secondary | ICD-10-CM | POA: Diagnosis not present

## 2020-03-17 DIAGNOSIS — F419 Anxiety disorder, unspecified: Secondary | ICD-10-CM | POA: Diagnosis not present

## 2020-03-17 NOTE — Progress Notes (Signed)
Chief Complaint:   OBESITY [name] is here to discuss her progress with her obesity treatment plan along with follow-up of her obesity related diagnoses. [name] is on the Category 1 Plan and states she is following her eating plan approximately 50% of the time. [name] states she is using the eliptical 30 minutes 3-5 times per week.  Today's visit was #: 4 Starting weight: 171 lbs Starting date: 01/23/2020 Today's weight: 165 lbs Today's date: 03/11/2020 Total lbs lost to date: 6 lbs Total lbs lost since last in-office visit: +2 lbs Total weight loss percentage to date: -3.51%  Interim History: [name] states she is not ever hungry. She doesn't eat all of her food on the plan and skips a lot. Review of RMR with [name] today shows 2360. I educated her on, and explained, that because she skipped so many meals, when coming to me, we put her on less food in the hopes that she would eat. But she needs so much more in order to lose weight.  Plan: Armeda received extensive education on the importance of how she needs to eat the foods on the plan in order to lose weight.  Assessment/Plan:    1. Vitamin D deficiency [name] Vitamin D level was 38.7 on 01/23/2020. She is currently taking prescription vitamin D 50,000 IU each week. She denies nausea, vomiting or muscle weakness.  Plan: We will refill Vit D for 1 month. Low Vitamin D level contributes to fatigue and are associated with obesity, breast, and colon cancer. She agrees to continue to take prescription Vitamin D @50 ,000 IU every week and will follow-up for routine testing of Vitamin D, at least 2-3 times per year to avoid over-replacement.  Refill- Vitamin D, Ergocalciferol, (DRISDOL) 1.25 MG (50000 UNIT) CAPS capsule; Take 1 capsule (50,000 Units total) by mouth every 7 (seven) days.  Dispense: 4 capsule; Refill: 0    2. Other depression with emotional eating [name] will continue working with counselor once  a week, but she missed her appointment last week. She is experiencing more challenges with her BF/interpersonal relationship. [name] is prescribed Wellbutrin, Buspar, and Ativan everyday per sleep doctor, Dr. Harvel Quale.  Plan: Kayte will continue medications, per Dr. Harvel Quale. She will also continue CBT. Handouts given today and extensive counseling on emotional eating strategies was also provided to the patient.    3. At risk for deficient intake of food [name] was given approximately 30 minutes of deficit intake of food prevention counseling today. [name] is at risk for eating too few calories based on current food recall. She was encouraged to focus on meeting caloric and protein goals according to her recommended meal plan.   4. Class 1 obesity with serious comorbidity and body mass index (BMI) of 32.0 to 32.9 in adult, unspecified obesity type [name] is currently in the action stage of change. As such, her goal is to continue with weight loss efforts. She has agreed to change from the Category 1 Plan to the Category 3 Plan now that she is able to eat all foods on plan.    Exercise goals: Try to get in exercise 5 days a week for 30 minutes minimum.   Behavioral modification strategies: no skipping meals, emotional eating strategies, holiday eating strategies , celebration eating strategies and planning for success.  [name] has agreed to follow-up with our clinic in 2 weeks. She was informed of the importance of frequent follow-up visits to maximize her success with intensive lifestyle modifications for her multiple  health conditions.     Objective:   Blood pressure 108/69, temperature 98.2 F (36.8 C), height 5' (1.524 m), weight 165 lb (74.8 kg), SpO2 100 %. Body mass index is 32.22 kg/m.  General: Cooperative, alert, well developed, in no acute distress. HEENT: Conjunctivae and lids unremarkable. Cardiovascular: Regular rhythm.  Lungs: Normal work of  breathing. Neurologic: No focal deficits.   Lab Results  Component Value Date   CREATININE 0.87 12/19/2019   BUN 16 12/19/2019   NA 140 12/19/2019   K 3.9 12/19/2019   CL 105 12/19/2019   CO2 28 12/19/2019   Lab Results  Component Value Date   ALT 16 12/19/2019   AST 19 12/19/2019   ALKPHOS 76 01/22/2016   BILITOT 0.3 12/19/2019   Lab Results  Component Value Date   HGBA1C 5.3 12/19/2019   Lab Results  Component Value Date   INSULIN 8.5 01/23/2020   Lab Results  Component Value Date   TSH 3.52 12/19/2019   Lab Results  Component Value Date   CHOL 226 (H) 01/23/2020   HDL 68 01/23/2020   LDLCALC 140 (H) 01/23/2020   TRIG 104 01/23/2020   Lab Results  Component Value Date   WBC 8.8 12/19/2019   HGB 12.8 12/19/2019   HCT 38.5 12/19/2019   MCV 92.3 12/19/2019   PLT 245 12/19/2019   Lab Results  Component Value Date   FERRITIN 31.7 10/20/2014    Attestation Statements:   Reviewed by clinician on day of visit: allergies, medications, problem list, medical history, surgical history, family history, social history, and previous encounter notes.  Edmund Hilda, am acting as Energy manager for [employer], DO.  I have reviewed the above documentation for accuracy and completeness, and I agree with the above. [name], D.O.  The 21st Century Cures Act was signed into law in 2016 which includes the topic of electronic health records.  This provides immediate access to information in MyChart.  This includes consultation notes, operative notes, office notes, lab results and pathology reports.  If you have any questions about what you read please let us know at your next visit so we can discuss your concerns and take corrective action if need be.  We are right here with you.

## 2020-03-23 ENCOUNTER — Encounter (INDEPENDENT_AMBULATORY_CARE_PROVIDER_SITE_OTHER): Payer: Self-pay | Admitting: Family Medicine

## 2020-03-23 ENCOUNTER — Other Ambulatory Visit (INDEPENDENT_AMBULATORY_CARE_PROVIDER_SITE_OTHER): Payer: Self-pay | Admitting: Family Medicine

## 2020-03-23 ENCOUNTER — Ambulatory Visit (INDEPENDENT_AMBULATORY_CARE_PROVIDER_SITE_OTHER): Payer: 59 | Admitting: Family Medicine

## 2020-03-23 ENCOUNTER — Other Ambulatory Visit: Payer: Self-pay

## 2020-03-23 VITALS — BP 112/72 | HR 91 | Temp 98.1°F | Ht 60.0 in | Wt 163.0 lb

## 2020-03-23 DIAGNOSIS — F39 Unspecified mood [affective] disorder: Secondary | ICD-10-CM

## 2020-03-23 DIAGNOSIS — E88819 Insulin resistance, unspecified: Secondary | ICD-10-CM

## 2020-03-23 DIAGNOSIS — E559 Vitamin D deficiency, unspecified: Secondary | ICD-10-CM | POA: Diagnosis not present

## 2020-03-23 DIAGNOSIS — Z9189 Other specified personal risk factors, not elsewhere classified: Secondary | ICD-10-CM

## 2020-03-23 DIAGNOSIS — F419 Anxiety disorder, unspecified: Secondary | ICD-10-CM | POA: Diagnosis not present

## 2020-03-23 DIAGNOSIS — E8881 Metabolic syndrome: Secondary | ICD-10-CM | POA: Insufficient documentation

## 2020-03-23 DIAGNOSIS — E669 Obesity, unspecified: Secondary | ICD-10-CM

## 2020-03-23 DIAGNOSIS — F331 Major depressive disorder, recurrent, moderate: Secondary | ICD-10-CM | POA: Diagnosis not present

## 2020-03-23 HISTORY — DX: Insulin resistance, unspecified: E88.819

## 2020-03-23 HISTORY — DX: Unspecified mood (affective) disorder: F39

## 2020-03-23 MED ORDER — VITAMIN D (ERGOCALCIFEROL) 1.25 MG (50000 UNIT) PO CAPS: 50000.0000 [IU] | ORAL_CAPSULE | ORAL | 0 refills | Status: DC

## 2020-03-23 MED FILL — VIT D2 1.25 MG (50,000 UNIT: 1.25 MG | 28 days supply | Qty: 4 | Fill #0

## 2020-03-24 DIAGNOSIS — M62838 Other muscle spasm: Secondary | ICD-10-CM | POA: Diagnosis not present

## 2020-03-24 DIAGNOSIS — M6281 Muscle weakness (generalized): Secondary | ICD-10-CM | POA: Diagnosis not present

## 2020-03-24 DIAGNOSIS — R350 Frequency of micturition: Secondary | ICD-10-CM | POA: Diagnosis not present

## 2020-03-24 DIAGNOSIS — N393 Stress incontinence (female) (male): Secondary | ICD-10-CM | POA: Diagnosis not present

## 2020-03-24 NOTE — Progress Notes (Signed)
Chief Complaint:   OBESITY [name] is here to discuss her progress with her obesity treatment plan along with follow-up of her obesity related diagnoses. [name] is on the Category 3 Plan and states she is following her eating plan approximately 75% of the time. [name] states she is exercising on the elliptical 30 minutes 3 times per week.  Today's visit was #: 5 Starting weight: 171 lbs Starting date: 01/23/2020 Today's weight: 163 lbs Today's date: 03/23/2020 Total lbs lost to date: 8 lbs Total lbs lost since last in-office visit: 2 lbs Total weight loss percentage to date: -4.68%  Interim History: [name] states that she was very mindful over Thanksgiving. She notes that she could have exercised more but was with family for 9 days. The kids were out of school the whole week of Thanksgiving. She makes healthier snack options.  Plan: [name] plans to go to the gym more.  Assessment/Plan:   1. Vitamin D deficiency [name]'s Vitamin D level was 38.7 on 01/23/2020. She is currently taking prescription vitamin D 50,000 IU each week. She denies nausea, vomiting or muscle weakness.  Plan: Refill Vit D for 1 month, as per below. Low Vitamin D level contributes to fatigue and are associated with obesity, breast, and colon cancer. She agrees to continue to take prescription Vitamin D @50 ,000 IU every week and will follow-up for routine testing of Vitamin D, at least 2-3 times per year to avoid over-replacement.  Refill- Vitamin D, Ergocalciferol, (DRISDOL) 1.25 MG (50000 UNIT) CAPS capsule; Take 1 capsule (50,000 Units total) by mouth every 7 (seven) days.  Dispense: 4 capsule; Refill: 0  2. Mood disorder (HCC),with emotional eating [name] meets with Dr. of psychiatry this Thursday. Her mood is stable; no better, no worse.  Plan: Treatment plan, per psychiatry team. Also, daily exercise will help.  3. Insulin resistance [name] has a diagnosis of insulin  resistance based on her elevated fasting insulin level >5. She continues to work on diet and exercise to decrease her risk of diabetes.  Lab Results  Component Value Date   INSULIN 8.5 01/23/2020   Lab Results  Component Value Date   HGBA1C 5.3 12/19/2019   Plan: [name] will continue to work on weight loss, exercise, and decreasing simple carbohydrates to help decrease the risk of diabetes. [name] agreed to follow-up with Harvel Quale as directed to closely monitor her progress.  4. At risk for deficient intake of food [name] was given approximately 15 minutes of deficit intake of food prevention counseling today. [name] is at risk for eating too few calories based on current food recall. She was encouraged to focus on meeting caloric and protein goals according to her recommended meal plan.   5. Class 1 obesity with serious comorbidity and body mass index (BMI) of 32.0 to 32.9 in adult, unspecified obesity type [name] is currently in the action stage of change. As such, her goal is to continue with weight loss efforts. She has agreed to the Category 3 Plan.   Exercise goals: As is  Behavioral modification strategies: increasing lean protein intake, meal planning and cooking strategies, keeping healthy foods in the home and planning for success.  [name] has agreed to follow-up with our clinic in 2 weeks. She was informed of the importance of frequent follow-up visits to maximize her success with intensive lifestyle modifications for her multiple health conditions.   Objective:   Blood pressure 112/72, pulse 91, temperature 98.1 F (36.7 C), height 5' (1.524 m), weight  163 lb (73.9 kg), SpO2 99 %. Body mass index is 31.83 kg/m.  General: Cooperative, alert, well developed, in no acute distress. HEENT: Conjunctivae and lids unremarkable. Cardiovascular: Regular rhythm.  Lungs: Normal work of breathing. Neurologic: No focal deficits.   Lab Results  Component Value Date    CREATININE 0.87 12/19/2019   BUN 16 12/19/2019   NA 140 12/19/2019   K 3.9 12/19/2019   CL 105 12/19/2019   CO2 28 12/19/2019   Lab Results  Component Value Date   ALT 16 12/19/2019   AST 19 12/19/2019   ALKPHOS 76 01/22/2016   BILITOT 0.3 12/19/2019   Lab Results  Component Value Date   HGBA1C 5.3 12/19/2019   Lab Results  Component Value Date   INSULIN 8.5 01/23/2020   Lab Results  Component Value Date   TSH 3.52 12/19/2019   Lab Results  Component Value Date   CHOL 226 (H) 01/23/2020   HDL 68 01/23/2020   LDLCALC 140 (H) 01/23/2020   TRIG 104 01/23/2020   Lab Results  Component Value Date   WBC 8.8 12/19/2019   HGB 12.8 12/19/2019   HCT 38.5 12/19/2019   MCV 92.3 12/19/2019   PLT 245 12/19/2019   Lab Results  Component Value Date   FERRITIN 31.7 10/20/2014    Attestation Statements:   Reviewed by clinician on day of visit: allergies, medications, problem list, medical history, surgical history, family history, social history, and previous encounter notes.  Edmund Hilda, am acting as Energy manager for [employer], DO.  I have reviewed the above documentation for accuracy and completeness, and I agree with the above. [name], D.O.  The 21st Century Cures Act was signed into law in 2016 which includes the topic of electronic health records.  This provides immediate access to information in MyChart.  This includes consultation notes, operative notes, office notes, lab results and pathology reports.  If you have any questions about what you read please let us know at your next visit so we can discuss your concerns and take corrective action if need be.  We are right here with you.

## 2020-03-25 ENCOUNTER — Other Ambulatory Visit (HOSPITAL_COMMUNITY): Payer: Self-pay | Admitting: Obstetrics and Gynecology

## 2020-03-25 DIAGNOSIS — R87619 Unspecified abnormal cytological findings in specimens from cervix uteri: Secondary | ICD-10-CM | POA: Diagnosis not present

## 2020-03-25 DIAGNOSIS — N760 Acute vaginitis: Secondary | ICD-10-CM | POA: Diagnosis not present

## 2020-03-25 DIAGNOSIS — N870 Mild cervical dysplasia: Secondary | ICD-10-CM | POA: Diagnosis not present

## 2020-03-26 ENCOUNTER — Other Ambulatory Visit: Payer: Self-pay

## 2020-03-26 ENCOUNTER — Telehealth (HOSPITAL_COMMUNITY): Payer: 59 | Admitting: Psychiatry

## 2020-03-26 DIAGNOSIS — F331 Major depressive disorder, recurrent, moderate: Secondary | ICD-10-CM

## 2020-03-26 DIAGNOSIS — G4700 Insomnia, unspecified: Secondary | ICD-10-CM

## 2020-03-26 DIAGNOSIS — F411 Generalized anxiety disorder: Secondary | ICD-10-CM

## 2020-03-26 MED FILL — FLUCONAZOLE 150 MG TABS: 150 | 4 days supply | Qty: 2 | Fill #0

## 2020-03-26 NOTE — Progress Notes (Unsigned)
Doing ok  Anxiety better and irritability with Buspar  Sleep variable even with Ativan. Works 2 nights a week   Depression unchanged- unmotivated, low energy, anhedonia, depression level 7/10, denies SI/HI  Started weekly therapy and it's helping   P: increase Wellbutrin XR 450mg  12/30 @ 2pm (1/31)

## 2020-03-30 MED FILL — LORazepam 1 MG TABS: 1 | 30 days supply | Qty: 30 | Fill #0

## 2020-03-31 ENCOUNTER — Other Ambulatory Visit (HOSPITAL_COMMUNITY): Payer: Self-pay | Admitting: Psychiatry

## 2020-03-31 MED ORDER — BUPROPION HCL ER (XL) 150 MG PO TB24: 450.0000 mg | ORAL_TABLET | Freq: Every day | ORAL | 0 refills | Status: DC

## 2020-03-31 MED ORDER — BUSPIRONE HCL 15 MG PO TABS: 15.0000 mg | ORAL_TABLET | Freq: Two times a day (BID) | ORAL | 0 refills | Status: DC

## 2020-03-31 MED ORDER — LORAZEPAM 1 MG PO TABS: 1.0000 mg | ORAL_TABLET | Freq: Every day | ORAL | 0 refills | Status: DC | PRN

## 2020-03-31 MED FILL — buPROPion HCL ER (XL) 150 M: 150 | 90 days supply | Qty: 270 | Fill #0

## 2020-04-01 DIAGNOSIS — F331 Major depressive disorder, recurrent, moderate: Secondary | ICD-10-CM | POA: Diagnosis not present

## 2020-04-01 DIAGNOSIS — F419 Anxiety disorder, unspecified: Secondary | ICD-10-CM | POA: Diagnosis not present

## 2020-04-07 DIAGNOSIS — F419 Anxiety disorder, unspecified: Secondary | ICD-10-CM | POA: Diagnosis not present

## 2020-04-07 DIAGNOSIS — F331 Major depressive disorder, recurrent, moderate: Secondary | ICD-10-CM | POA: Diagnosis not present

## 2020-04-07 MED FILL — XELJANZ XR 11 MG TB24: 11 | 30 days supply | Qty: 30 | Fill #3

## 2020-04-10 ENCOUNTER — Other Ambulatory Visit: Payer: Self-pay

## 2020-04-10 ENCOUNTER — Encounter: Payer: Self-pay | Admitting: Family Medicine

## 2020-04-10 ENCOUNTER — Ambulatory Visit: Payer: 59 | Admitting: Family Medicine

## 2020-04-10 ENCOUNTER — Other Ambulatory Visit: Payer: Self-pay | Admitting: Family Medicine

## 2020-04-10 VITALS — BP 110/70 | HR 85 | Resp 16 | Ht 60.0 in | Wt 170.0 lb

## 2020-04-10 DIAGNOSIS — H6690 Otitis media, unspecified, unspecified ear: Secondary | ICD-10-CM | POA: Diagnosis not present

## 2020-04-10 DIAGNOSIS — J069 Acute upper respiratory infection, unspecified: Secondary | ICD-10-CM

## 2020-04-10 DIAGNOSIS — H6981 Other specified disorders of Eustachian tube, right ear: Secondary | ICD-10-CM | POA: Diagnosis not present

## 2020-04-10 MED ORDER — AMOXICILLIN-POT CLAVULANATE 875-125 MG PO TABS: 1.0000 | ORAL_TABLET | Freq: Two times a day (BID) | ORAL | 0 refills | Status: DC

## 2020-04-10 MED ORDER — FLUTICASONE PROPIONATE 50 MCG/ACT NA SUSP: 1.0000 | Freq: Two times a day (BID) | NASAL | 0 refills | Status: DC

## 2020-04-10 MED FILL — FLUTICASONE PROP 50 MCG SPR: 50 | 30 days supply | Qty: 16 | Fill #0

## 2020-04-10 MED FILL — AMOX-CLAV 875-125 MG TABLET: 875-125 | 7 days supply | Qty: 14 | Fill #0

## 2020-04-10 NOTE — Patient Instructions (Signed)
A few things to remember from today's visit:   URI, acute - Plan: fluticasone (FLONASE) 50 MCG/ACT nasal spray  Dysfunction of right eustachian tube - Plan: fluticasone (FLONASE) 50 MCG/ACT nasal spray  Acute otitis media, unspecified otitis media type - Plan: amoxicillin-clavulanate (AUGMENTIN) 875-125 MG tablet  I do not think antibiotic is needed at this time. Pop ear a few times during the day. Avoid antihistaminics like zyrtec,allegra,or claritine for now. If earache is not any better in 3 days start antibiotic. Monitor for worsening symptoms,hearing loss, and fever.   Please be sure medication list is accurate. If a new problem present, please set up appointment sooner than planned today.

## 2020-04-10 NOTE — Progress Notes (Signed)
ACUTE VISIT Chief Complaint  Patient presents with  . ear infection    Pressure in ears - no fever, no sinus pressure, ongoing x 2 days, off balance.   HPI: [name] is a 34 y.o. female with history of GERD, depression, and RA here today complaining of a week of respiratory symptoms. 2 days of right ear pressure and fullness sensation. She has not noted ear drainage or tinnitus. Pain is constant, dull/achy, "not bad." + Sore throat. Negative for dysphagia. + Dysphonia, improved.  + Spinning sensation when she blows her nose or does "pop" her ears. Negative for fever, chills, body aches, and nausea, I have nausea, or skin rash. Nonproductive cough. Negative for dyspnea, wheezing, or CP.  For about 3 to 4 days she has had nasal congestion, rhinorrhea, and postnasal drainage. Reporting a recent negative COVID-19 test.  Her son was recently sick. Negative for recent travel. She has taken OTC NyQuil and DayQuil.  Review of Systems  Constitutional: Positive for activity change, appetite change and fatigue.  HENT: Negative for mouth sores and nosebleeds.   Eyes: Negative for discharge, redness and itching.  Gastrointestinal: Negative for abdominal pain, nausea and vomiting.       Negative for changes in bowel habits.  Allergic/Immunologic: Negative for environmental allergies.  Neurological: Negative for syncope, weakness and headaches.  Hematological: Negative for adenopathy. Does not bruise/bleed easily.  Rest see pertinent positives and negatives per HPI.  Current Outpatient Medications on File Prior to Visit  Medication Sig Dispense Refill  . acetaminophen (TYLENOL) 500 MG tablet Take 500 mg by mouth in the morning and at bedtime. Take 1-2 tablets daily as needed.    Marland Kitchen buPROPion (WELLBUTRIN XL) 150 MG 24 hr tablet Take 3 tablets (450 mg total) by mouth daily. 270 tablet 0  . busPIRone (BUSPAR) 15 MG tablet Take 1 tablet (15 mg total) by mouth 2 (two) times  daily. 180 tablet 0  . ibuprofen (ADVIL) 200 MG tablet Take 200 mg by mouth every 6 (six) hours as needed.    Colleen Can FE 1/20 1-20 MG-MCG tablet Take 1 tablet by mouth daily.  7  . LORazepam (ATIVAN) 1 MG tablet Take 1 tablet (1 mg total) by mouth daily as needed for anxiety. 30 tablet 0  . Melatonin 5 MG TABS Take by mouth.    . Tofacitinib Citrate ER (XELJANZ XR) 11 MG TB24 Take 1 tablet by mouth once daily. 30 tablet 3  . Vitamin D, Ergocalciferol, (DRISDOL) 1.25 MG (50000 UNIT) CAPS capsule Take 1 capsule (50,000 Units total) by mouth every 7 (seven) days. 4 capsule 0   No current facility-administered medications on file prior to visit.   Past Medical History:  Diagnosis Date  . Abnormal Pap smear of cervix   . Anxiety   . Brachydactyly of fingers   . Depression    sees Jorje Guild PA at [facility]  . GERD (gastroesophageal reflux disease)   . Gynecological examination    sees Dr. Henderson Cloud  . HPV (human papilloma virus) infection   . Pregnancy    due August 10, 2014  . Rheumatoid arthritis(714.0)    sees Dr. Azzie Roup   . Tracheomalacia, congenital    Allergies  Allergen Reactions  . 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 Nutmeg Oil (Myristica Oil) Hives  . Succinylcholine     Possible malignant hyperthermia after succinylcholine use 01/01/18   Social History  Socioeconomic History  . Marital status: Divorced    Spouse name: Not on file  . Number of children: 1  . Years of education: Not on file  . Highest education level: Not on file  Occupational History  . Occupation: Academic librarian: [employer]  Tobacco Use  . Smoking status: Former Smoker    Packs/day: 0.50    Types: Cigarettes    Quit date: 08/11/2007    Years since quitting: 12.6  . Smokeless tobacco: Never Used  Vaping Use  . Vaping Use: Never used  Substance and Sexual Activity  . Alcohol use: Yes    Alcohol/week: 8.0 standard drinks    Types:  8 Glasses of wine per week    Comment: near daily  . Drug use: No  . Sexual activity: Yes    Partners: Male    Birth control/protection: None    Comment: had intercourse yesterday  Other Topics Concern  . Not on file  Social History Narrative  . Not on file   Social Determinants of Health   Financial Resource Strain: Not on file  Food Insecurity: Not on file  Transportation Needs: Not on file  Physical Activity: Not on file  Stress: Not on file  Social Connections: Not on file   Vitals:   04/10/20 0707  BP: 110/70  Pulse: 85  Resp: 16  SpO2: 99%   Body mass index is 33.2 kg/m.  Physical Exam Constitutional:      General: She is not in acute distress.    Appearance: She is well-developed. She is not ill-appearing.  HENT:     Head: Normocephalic and atraumatic.     Right Ear: Ear canal and external ear normal. No swelling. Tympanic membrane is erythematous (Mildly) and bulging.     Left Ear: Tympanic membrane, ear canal and external ear normal.     Nose: Rhinorrhea present. No mucosal edema.     Right Turbinates: Enlarged.     Left Turbinates: Enlarged.     Mouth/Throat:     Mouth: Oropharynx is clear and moist and mucous membranes are normal. Mucous membranes are moist.     Pharynx: Oropharynx is clear.  Eyes:     Conjunctiva/sclera: Conjunctivae normal.  Cardiovascular:     Rate and Rhythm: Normal rate and regular rhythm.     Heart sounds: No murmur heard.   Pulmonary:     Effort: Pulmonary effort is normal. No respiratory distress.     Breath sounds: Normal breath sounds. No stridor.  Lymphadenopathy:     Head:     Right side of head: No submandibular adenopathy.     Left side of head: No submandibular adenopathy.     Cervical: No cervical adenopathy.  Skin:    General: Skin is warm.     Findings: No erythema or rash.  Neurological:     Mental Status: She is alert and oriented to person, place, and time.     Cranial Nerves: No cranial nerve deficit.      Deep Tendon Reflexes: Strength normal.  Psychiatric:        Mood and Affect: Mood and affect normal.     Comments: Well groomed, good eye contact.   ASSESSMENT AND PLAN:  [name] was seen today for ear infection.  Diagnoses and all orders for this visit:  URI, acute Explained that it is most likely viral, so symptomatic treatment is recommended at this time. Adequate hydration. Intranasal Flonase to help with nasal congestion  and rhinorrhea. Monitor for new onset of fever. Explained that nasal congestion and cough can last a few days and even weeks.  -     fluticasone (FLONASE) 50 MCG/ACT nasal spray; Place 1 spray into both nostrils 2 (two) times daily.  Dysfunction of right eustachian tube OTC decongestants may help, we discussed some side effects. Recommend auto inflation maneuvers a few times throughout the day. Plain Mucinex may help. For now avoid antihistaminics. Intranasal Flonase may also help.  -     fluticasone (FLONASE) 50 MCG/ACT nasal spray; Place 1 spray into both nostrils 2 (two) times daily.  Acute otitis media, unspecified otitis media type TM with mild erythema and middle ear effusion. At this time I do not think antibiotic treatment is recommended, instructed to start antibiotic if he really is not any better in 2 to 3 days. Auto inflation maneuvers, plain Mucinex, and OTC decongestant may help.  -     amoxicillin-clavulanate (AUGMENTIN) 875-125 MG tablet; Take 1 tablet by mouth 2 (two) times daily for 7 days.  Return if symptoms worsen or fail to improve.   Khilynn Borntreger G. [place], MD  [facility]. [place] office.  A few things to remember from today's visit:   URI, acute - Plan: fluticasone (FLONASE) 50 MCG/ACT nasal spray  Dysfunction of right eustachian tube - Plan: fluticasone (FLONASE) 50 MCG/ACT nasal spray  Acute otitis media, unspecified otitis media type - Plan: amoxicillin-clavulanate (AUGMENTIN) 875-125 MG tablet  I do  not think antibiotic is needed at this time. Pop ear a few times during the day. Avoid antihistaminics like zyrtec,allegra,or claritine for now. If earache is not any better in 3 days start antibiotic. Monitor for worsening symptoms,hearing loss, and fever.   Please be sure medication list is accurate. If a new problem present, please set up appointment sooner than planned today.

## 2020-04-13 ENCOUNTER — Other Ambulatory Visit: Payer: Self-pay

## 2020-04-13 ENCOUNTER — Ambulatory Visit (INDEPENDENT_AMBULATORY_CARE_PROVIDER_SITE_OTHER): Payer: 59 | Admitting: Family Medicine

## 2020-04-13 ENCOUNTER — Encounter (INDEPENDENT_AMBULATORY_CARE_PROVIDER_SITE_OTHER): Payer: Self-pay | Admitting: Family Medicine

## 2020-04-13 VITALS — BP 106/74 | HR 75 | Temp 98.1°F | Ht 60.0 in | Wt 164.0 lb

## 2020-04-13 DIAGNOSIS — E88819 Insulin resistance, unspecified: Secondary | ICD-10-CM

## 2020-04-13 DIAGNOSIS — Z9189 Other specified personal risk factors, not elsewhere classified: Secondary | ICD-10-CM

## 2020-04-13 DIAGNOSIS — E559 Vitamin D deficiency, unspecified: Secondary | ICD-10-CM | POA: Diagnosis not present

## 2020-04-13 DIAGNOSIS — F419 Anxiety disorder, unspecified: Secondary | ICD-10-CM | POA: Diagnosis not present

## 2020-04-13 DIAGNOSIS — E669 Obesity, unspecified: Secondary | ICD-10-CM

## 2020-04-13 DIAGNOSIS — Z6832 Body mass index (BMI) 32.0-32.9, adult: Secondary | ICD-10-CM | POA: Diagnosis not present

## 2020-04-13 DIAGNOSIS — F331 Major depressive disorder, recurrent, moderate: Secondary | ICD-10-CM | POA: Diagnosis not present

## 2020-04-13 DIAGNOSIS — F39 Unspecified mood [affective] disorder: Secondary | ICD-10-CM

## 2020-04-13 DIAGNOSIS — E8881 Metabolic syndrome: Secondary | ICD-10-CM | POA: Diagnosis not present

## 2020-04-13 DIAGNOSIS — E66811 Obesity, class 1: Secondary | ICD-10-CM

## 2020-04-13 HISTORY — DX: Other specified personal risk factors, not elsewhere classified: Z91.89

## 2020-04-13 MED ORDER — VITAMIN D (ERGOCALCIFEROL) 1.25 MG (50000 UNIT) PO CAPS
50000.0000 [IU] | ORAL_CAPSULE | ORAL | 0 refills | Status: DC
Start: 2020-04-13 — End: 2020-05-06

## 2020-04-13 NOTE — Progress Notes (Signed)
Chief Complaint:   OBESITY [name] is here to discuss her progress with her obesity treatment plan along with follow-up of her obesity related diagnoses. [name] is on the Category 3 Plan and states she is following her eating plan approximately 50% of the time. [name] states she is using the elliptical amd walking dogs 30 minutes 3-4 times per week.  Today's visit was #: 6 Starting weight: 171 lbs Starting date: 01/23/2020 Today's weight: 164 lbs Today's date: 04/14/2020 Total lbs lost to date: 7 lbs Total lbs lost since last in-office visit: +1 lb Total weight loss percentage to date: -4.09%  Interim History: [name] reports she caught a cold from her son. She has been sick and on antibiotics for 3 days now. [name] wasn't eating much at first but now is eating carbs and "unhealthy items". 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 [name] is celebrating her birthday tomorrow and her mom's is today. With holiday cravings up, she is worried about gaining weight.  Assessment/Plan:   1. Insulin resistance [name] has a diagnosis of insulin resistance based on her elevated fasting insulin level >5. She continues to work on diet and exercise to decrease her risk of diabetes and is on no medication.  Lab Results  Component Value Date   INSULIN 8.5 01/23/2020   Lab Results  Component Value Date   HGBA1C 5.3 12/19/2019   Plan: [name] will continue to work on weight loss, exercise, and decreasing simple carbohydrates to help decrease the risk of diabetes. [name] agreed to follow-up with [place] as directed to closely monitor her progress.  2. Vitamin D deficiency [name]'s Vitamin D level was 38.7 on 01/23/2020. She is currently taking prescription vitamin D 50,000 IU each week. She denies nausea, vomiting or muscle weakness.   Ref. Range 01/23/2020 10:09  Vitamin D, 25-Hydroxy Latest Ref Range: 30.0 - 100.0 ng/mL 38.7   Plan: Refill Vit D  for 1 month, as per below. Low Vitamin D level contributes to fatigue and are associated with obesity, breast, and colon cancer. She agrees to continue to take prescription Vitamin D @50 ,000 IU every week and will follow-up for routine testing of Vitamin D, at least 2-3 times per year to avoid over-replacement.  Refill- Vitamin D, Ergocalciferol, (DRISDOL) 1.25 MG (50000 UNIT) CAPS capsule; Take 1 capsule (50,000 Units total) by mouth every 7 (seven) days.  Dispense: 4 capsule; Refill: 0  3. Mood disorder (HCC),with emotional eating Dr. of psychiatry increased [name] Wellbutrin dose but she's not sure it's helping yet. She takes Buspar twice daily, but she is experiencing depression and anxiety symptoms lately with the holidays.  Plan: Medication management, per psychiatry. Continue CBT weekly with counselor and continue home workbooks, etc. Ask psychiatrist about taking Buspar TID and follow up in the near future.   4. At risk for impaired metabolic function [name] was given approximately 30 minutes of impaired  metabolic function prevention counseling today. We discussed intensive lifestyle modifications today with an emphasis on specific nutrition and exercise instructions and strategies.   5. Class 1 obesity with serious comorbidity and body mass index (BMI) of 32.0 to 32.9 in adult, unspecified obesity type [name] is currently in the action stage of change. As such, her goal is to continue with weight loss efforts. She has agreed to the Category 3 Plan.   Exercise goals: For substantial health benefits, adults should do at least 150 minutes (2 hours and 30 minutes) a week  of moderate-intensity, or 75 minutes (1 hour and 15 minutes) a week of vigorous-intensity aerobic physical activity, or an equivalent combination of moderate- and vigorous-intensity aerobic activity. Aerobic activity should be performed in episodes of at least 10 minutes, and preferably, it should be spread  throughout the week.  Behavioral modification strategies: emotional eating strategies, holiday eating strategies  and celebration eating strategies.  [name] has agreed to follow-up with our clinic in 2-3 weeks. She was informed of the importance of frequent follow-up visits to maximize her success with intensive lifestyle modifications for her multiple health conditions.   Objective:   Blood pressure 106/74, pulse 75, temperature 98.1 F (36.7 C), height 5' (1.524 m), weight 164 lb (74.4 kg), SpO2 99 %. Body mass index is 32.03 kg/m.  General: Cooperative, alert, well developed, in no acute distress. HEENT: Conjunctivae and lids unremarkable. Cardiovascular: Regular rhythm.  Lungs: Normal work of breathing. Neurologic: No focal deficits.   Lab Results  Component Value Date   CREATININE 0.87 12/19/2019   BUN 16 12/19/2019   NA 140 12/19/2019   K 3.9 12/19/2019   CL 105 12/19/2019   CO2 28 12/19/2019   Lab Results  Component Value Date   ALT 16 12/19/2019   AST 19 12/19/2019   ALKPHOS 76 01/22/2016   BILITOT 0.3 12/19/2019   Lab Results  Component Value Date   HGBA1C 5.3 12/19/2019   Lab Results  Component Value Date   INSULIN 8.5 01/23/2020   Lab Results  Component Value Date   TSH 3.52 12/19/2019   Lab Results  Component Value Date   CHOL 226 (H) 01/23/2020   HDL 68 01/23/2020   LDLCALC 140 (H) 01/23/2020   TRIG 104 01/23/2020   Lab Results  Component Value Date   WBC 8.8 12/19/2019   HGB 12.8 12/19/2019   HCT 38.5 12/19/2019   MCV 92.3 12/19/2019   PLT 245 12/19/2019   Lab Results  Component Value Date   FERRITIN 31.7 10/20/2014   Attestation Statements:   Reviewed by clinician on day of visit: allergies, medications, problem list, medical history, surgical history, family history, social history, and previous encounter notes.  Edmund Hilda, am acting as Energy manager for [employer], DO.  I have reviewed the above documentation  for accuracy and completeness, and I agree with the above. [name], D.O.  The 21st Century Cures Act was signed into law in 2016 which includes the topic of electronic health records.  This provides immediate access to information in MyChart.  This includes consultation notes, operative notes, office notes, lab results and pathology reports.  If you have any questions about what you read please let us know at your next visit so we can discuss your concerns and take corrective action if need be.  We are right here with you.

## 2020-04-21 DIAGNOSIS — F331 Major depressive disorder, recurrent, moderate: Secondary | ICD-10-CM | POA: Diagnosis not present

## 2020-04-21 DIAGNOSIS — F419 Anxiety disorder, unspecified: Secondary | ICD-10-CM | POA: Diagnosis not present

## 2020-04-23 ENCOUNTER — Other Ambulatory Visit: Payer: Self-pay

## 2020-04-23 ENCOUNTER — Other Ambulatory Visit (HOSPITAL_COMMUNITY): Payer: Self-pay | Admitting: Psychiatry

## 2020-04-23 ENCOUNTER — Telehealth (HOSPITAL_COMMUNITY): Payer: 59 | Admitting: Psychiatry

## 2020-04-23 DIAGNOSIS — F411 Generalized anxiety disorder: Secondary | ICD-10-CM

## 2020-04-23 DIAGNOSIS — G4700 Insomnia, unspecified: Secondary | ICD-10-CM

## 2020-04-23 DIAGNOSIS — F331 Major depressive disorder, recurrent, moderate: Secondary | ICD-10-CM

## 2020-04-23 MED ORDER — BUSPIRONE HCL 15 MG PO TABS
15.0000 mg | ORAL_TABLET | Freq: Two times a day (BID) | ORAL | 0 refills | Status: DC
Start: 2020-04-23 — End: 2020-06-04

## 2020-04-23 MED ORDER — BUPROPION HCL ER (XL) 150 MG PO TB24: 450.0000 mg | ORAL_TABLET | Freq: Every day | ORAL | 0 refills | Status: DC

## 2020-04-23 MED ORDER — LORAZEPAM 1 MG PO TABS
1.0000 mg | ORAL_TABLET | Freq: Every day | ORAL | 1 refills | Status: DC | PRN
Start: 2020-04-23 — End: 2020-06-04

## 2020-04-23 MED ORDER — ARIPIPRAZOLE 2 MG PO TABS: 2.0000 mg | ORAL_TABLET | Freq: Every day | ORAL | 1 refills | Status: DC

## 2020-04-23 MED FILL — ARIPIPRAZOLE 2 MG TABS: 2 | 30 days supply | Qty: 30 | Fill #0

## 2020-04-23 MED FILL — BUSPIRONE HCL 15 MG TABS: 15 | 90 days supply | Qty: 180 | Fill #0

## 2020-04-23 NOTE — Progress Notes (Signed)
Virtual Visit via Telephone Note  I connected with [name] on 04/23/20 at  2:00 PM EST by telephone and verified that I am speaking with the correct person using two identifiers.  Location: Patient: home Provider: office   I discussed the limitations, risks, security and privacy concerns of performing an evaluation and management service by telephone and the availability of in person appointments. I also discussed with the patient that there may be a patient responsible charge related to this service. The patient expressed understanding and agreed to proceed.   History of Present Illness: [name] is still experiencing episodes of severe depression. She feels depressed for most days of the week. It does not feel like the increase in Wellbutrin has helped at all. [name] is feeling sad, irritable, hopeless, negative self thoughts and crying. She is forcing herself to go to work and is doing what she needs to do. At home she has no motivation and has to force herself to walk her dogs and exercise. She is not doing house chores as much. [name] is going to weekly therapy sessions and they are targeting various areas of stress. She denies SI/HI but she feels very hopeless. Sleep is poor and she wakes up multiple times a night. Her anxiety comes and goes. She feels stuck.    Observations/Objective:  General Appearance: unable to assess  Eye Contact:  unable to assess  Speech:  Clear and Coherent and Normal Rate  Volume:  Normal  Mood:  Depressed  Affect:  Congruent  Thought Process:  Goal Directed, Linear, and Descriptions of Associations: Intact  Orientation:  Full (Time, Place, and Person)  Thought Content:  Logical  Suicidal Thoughts:  No  Homicidal Thoughts:  No  Memory:  Immediate;   Good  Judgement:  Good  Insight:  Good  Psychomotor Activity: unable to assess  Concentration:  Concentration: Good  Recall:  Good  Fund of Knowledge:  Good  Language:  Good  Akathisia:   unable to assess  Handed:  Right  AIMS (if indicated):     Assets:  Communication Skills Desire for Improvement Financial Resources/Insurance Housing Resilience Social Support Talents/Skills Transportation Vocational/Educational  ADL's:  unable to assess  Cognition:  WNL  Sleep:         Assessment and Plan: 1. Major depressive disorder, recurrent episode, moderate (HCC) - busPIRone (BUSPAR) 15 MG tablet; Take 1 tablet (15 mg total) by mouth 2 (two) times daily.  Dispense: 180 tablet; Refill: 0 - buPROPion (WELLBUTRIN XL) 150 MG 24 hr tablet; Take 3 tablets (450 mg total) by mouth daily.  Dispense: 270 tablet; Refill: 0 - start ARIPiprazole (ABILIFY) 2 MG tablet; Take 1 tablet (2 mg total) by mouth daily.  Dispense: 30 tablet; Refill: 1  2. GAD (generalized anxiety disorder) - busPIRone (BUSPAR) 15 MG tablet; Take 1 tablet (15 mg total) by mouth 2 (two) times daily.  Dispense: 180 tablet; Refill: 0 - LORazepam (ATIVAN) 1 MG tablet; Take 1 tablet (1 mg total) by mouth daily as needed for anxiety.  Dispense: 30 tablet; Refill: 1  3. Insomnia, unspecified type - busPIRone (BUSPAR) 15 MG tablet; Take 1 tablet (15 mg total) by mouth 2 (two) times daily.  Dispense: 180 tablet; Refill: 0 - LORazepam (ATIVAN) 1 MG tablet; Take 1 tablet (1 mg total) by mouth daily as needed for anxiety.  Dispense: 30 tablet; Refill: 1    Previous med trials- Zoloft, Prozac, Celexa, Pristiq, Effexor   Follow Up Instructions: In 6-8 weeks  or sooner if needed   I discussed the assessment and treatment plan with the patient. The patient was provided an opportunity to ask questions and all were answered. The patient agreed with the plan and demonstrated an understanding of the instructions.   The patient was advised to call back or seek an in-person evaluation if the symptoms worsen or if the condition fails to improve as anticipated.  I provided 21 minutes of non-face-to-face time during this  encounter.   Oletta Darter, MD

## 2020-05-04 DIAGNOSIS — F419 Anxiety disorder, unspecified: Secondary | ICD-10-CM | POA: Diagnosis not present

## 2020-05-04 DIAGNOSIS — F331 Major depressive disorder, recurrent, moderate: Secondary | ICD-10-CM | POA: Diagnosis not present

## 2020-05-04 MED FILL — LORAZEPAM 1 MG TABS: 1 | 30 days supply | Qty: 30 | Fill #1

## 2020-05-06 ENCOUNTER — Encounter (INDEPENDENT_AMBULATORY_CARE_PROVIDER_SITE_OTHER): Payer: Self-pay | Admitting: Family Medicine

## 2020-05-06 ENCOUNTER — Other Ambulatory Visit (INDEPENDENT_AMBULATORY_CARE_PROVIDER_SITE_OTHER): Payer: Self-pay | Admitting: Family Medicine

## 2020-05-06 ENCOUNTER — Other Ambulatory Visit: Payer: Self-pay

## 2020-05-06 ENCOUNTER — Ambulatory Visit (INDEPENDENT_AMBULATORY_CARE_PROVIDER_SITE_OTHER): Payer: 59 | Admitting: Family Medicine

## 2020-05-06 VITALS — BP 113/75 | HR 72 | Temp 98.0°F | Ht 60.0 in | Wt 163.0 lb

## 2020-05-06 DIAGNOSIS — E669 Obesity, unspecified: Secondary | ICD-10-CM

## 2020-05-06 DIAGNOSIS — Z6832 Body mass index (BMI) 32.0-32.9, adult: Secondary | ICD-10-CM

## 2020-05-06 DIAGNOSIS — F39 Unspecified mood [affective] disorder: Secondary | ICD-10-CM | POA: Diagnosis not present

## 2020-05-06 DIAGNOSIS — Z9189 Other specified personal risk factors, not elsewhere classified: Secondary | ICD-10-CM

## 2020-05-06 DIAGNOSIS — E559 Vitamin D deficiency, unspecified: Secondary | ICD-10-CM | POA: Diagnosis not present

## 2020-05-06 HISTORY — DX: Other specified personal risk factors, not elsewhere classified: Z91.89

## 2020-05-06 MED ORDER — VITAMIN D (ERGOCALCIFEROL) 1.25 MG (50000 UNIT) PO CAPS
50000.0000 [IU] | ORAL_CAPSULE | ORAL | 0 refills | Status: DC
Start: 2020-05-06 — End: 2020-05-20

## 2020-05-06 MED FILL — VIT D2 1.25 MG (50,000 UNIT: 1.25 MG | 28 days supply | Qty: 4 | Fill #0

## 2020-05-08 ENCOUNTER — Encounter: Payer: Self-pay | Admitting: Rheumatology

## 2020-05-08 DIAGNOSIS — M2550 Pain in unspecified joint: Secondary | ICD-10-CM | POA: Diagnosis not present

## 2020-05-08 DIAGNOSIS — E669 Obesity, unspecified: Secondary | ICD-10-CM | POA: Diagnosis not present

## 2020-05-08 DIAGNOSIS — M0609 Rheumatoid arthritis without rheumatoid factor, multiple sites: Secondary | ICD-10-CM | POA: Diagnosis not present

## 2020-05-08 DIAGNOSIS — R5382 Chronic fatigue, unspecified: Secondary | ICD-10-CM | POA: Diagnosis not present

## 2020-05-08 DIAGNOSIS — Z6832 Body mass index (BMI) 32.0-32.9, adult: Secondary | ICD-10-CM | POA: Diagnosis not present

## 2020-05-10 NOTE — Progress Notes (Unsigned)
Chief Complaint:   OBESITY [name] is here to discuss her progress with her obesity treatment plan along with follow-up of her obesity related diagnoses. [name] is on the Category 3 Plan and states she is following her eating plan approximately 50% of the time. [name] states she is walking 30-60 minutes 3-5 times per week.  Today's visit was #: 7 Starting weight: 171 lbs Starting date: 01/23/2020 Today's weight: 163 lbs Today's date: 05/06/2020 Total lbs lost to date: 8 lbs Total lbs lost since last in-office visit: 1 lb Total weight loss percentage to date: -4.68%  Interim History: [name] made it a point to go walking 3-4 days a week for 45-65 minutes each time over the holidays. She is glad she lost weight and is feeling better emotionally.  Plan: Patient given handouts on recipes. She denies a need for meal plan change.  Assessment/Plan:   1. Vitamin D deficiency [name]'s Vitamin D level was 38.7 on 01/23/2020. She is currently taking prescription vitamin D 50,000 IU each week. She denies nausea, vomiting or muscle weakness.  Ref. Range 01/23/2020 10:09  Vitamin D, 25-Hydroxy Latest Ref Range: 30.0 - 100.0 ng/mL 38.7   Plan: Refill Vit D for 1 month, as per below. Low Vitamin D level contributes to fatigue and are associated with obesity, breast, and colon cancer. She agrees to continue to take prescription Vitamin D @50 ,000 IU every week and will follow-up for routine testing of Vitamin D, at least 2-3 times per year to avoid over-replacement.  Refill- Vitamin D, Ergocalciferol, (DRISDOL) 1.25 MG (50000 UNIT) CAPS capsule; Take 1 capsule (50,000 Units total) by mouth every 7 (seven) days.  Dispense: 4 capsule; Refill: 0  2. Mood disorder (HCC),with emotional eating Dr. started Lithium on Abilify December 30th. She is feeling less depressed and using some self-help books as well.  Plan: Continue current treatment plan, per psychiatry. Increase exercise  and stress management techniques discussed with patient. Continue with self-care activities.   3. At risk for dehydration [name] was given approximately 15 minutes dehydration prevention counseling today. [name] is at risk for dehydration due to weight loss and current medication(s). She was encouraged to hydrate and monitor fluid status to avoid dehydration as well as weight loss plateaus.   4. Class 1 obesity with serious comorbidity and body mass index (BMI) of 32.0 to 32.9 in adult, unspecified obesity type [name] is currently in the action stage of change. As such, her goal is to continue with weight loss efforts. She has agreed to the Category 3 Plan.   Exercise goals: Increase walking to 5 days a week  Behavioral modification strategies: meal planning and cooking strategies, keeping healthy foods in the home and planning for success.  [name] has agreed to follow-up with our clinic in 2 weeks. She was informed of the importance of frequent follow-up visits to maximize her success with intensive lifestyle modifications for her multiple health conditions.   Objective:   Blood pressure 113/75, pulse 72, temperature 98 F (36.7 C), height 5' (1.524 m), weight 163 lb (73.9 kg), SpO2 99 %. Body mass index is 31.83 kg/m.  General: Cooperative, alert, well developed, in no acute distress. HEENT: Conjunctivae and lids unremarkable. Cardiovascular: Regular rhythm.  Lungs: Normal work of breathing. Neurologic: No focal deficits.   Lab Results  Component Value Date   CREATININE 0.87 12/19/2019   BUN 16 12/19/2019   NA 140 12/19/2019   K 3.9 12/19/2019   CL 105 12/19/2019  CO2 28 12/19/2019   Lab Results  Component Value Date   ALT 16 12/19/2019   AST 19 12/19/2019   ALKPHOS 76 01/22/2016   BILITOT 0.3 12/19/2019   Lab Results  Component Value Date   HGBA1C 5.3 12/19/2019   Lab Results  Component Value Date   INSULIN 8.5 01/23/2020   Lab Results  Component  Value Date   TSH 3.52 12/19/2019   Lab Results  Component Value Date   CHOL 226 (H) 01/23/2020   HDL 68 01/23/2020   LDLCALC 140 (H) 01/23/2020   TRIG 104 01/23/2020   Lab Results  Component Value Date   WBC 8.8 12/19/2019   HGB 12.8 12/19/2019   HCT 38.5 12/19/2019   MCV 92.3 12/19/2019   PLT 245 12/19/2019   Lab Results  Component Value Date   FERRITIN 31.7 10/20/2014    Attestation Statements:   Reviewed by clinician on day of visit: allergies, medications, problem list, medical history, surgical history, family history, social history, and previous encounter notes.  Edmund Hilda, am acting as Energy manager for [employer], DO.  I have reviewed the above documentation for accuracy and completeness, and I agree with the above. [name], D.O.  The 21st Century Cures Act was signed into law in 2016 which includes the topic of electronic health records.  This provides immediate access to information in MyChart.  This includes consultation notes, operative notes, office notes, lab results and pathology reports.  If you have any questions about what you read please let us know at your next visit so we can discuss your concerns and take corrective action if need be.  We are right here with you.

## 2020-05-12 DIAGNOSIS — F331 Major depressive disorder, recurrent, moderate: Secondary | ICD-10-CM | POA: Diagnosis not present

## 2020-05-12 DIAGNOSIS — F419 Anxiety disorder, unspecified: Secondary | ICD-10-CM | POA: Diagnosis not present

## 2020-05-14 ENCOUNTER — Other Ambulatory Visit: Payer: Self-pay | Admitting: Pharmacist

## 2020-05-14 MED ORDER — XELJANZ XR 11 MG PO TB24: ORAL_TABLET | ORAL | 3 refills | Status: DC

## 2020-05-14 MED FILL — XELJANZ XR 11 MG TB24: 11 | 30 days supply | Qty: 30 | Fill #0

## 2020-05-20 ENCOUNTER — Other Ambulatory Visit: Payer: Self-pay

## 2020-05-20 ENCOUNTER — Other Ambulatory Visit (INDEPENDENT_AMBULATORY_CARE_PROVIDER_SITE_OTHER): Payer: Self-pay | Admitting: Family Medicine

## 2020-05-20 ENCOUNTER — Encounter (INDEPENDENT_AMBULATORY_CARE_PROVIDER_SITE_OTHER): Payer: Self-pay | Admitting: Family Medicine

## 2020-05-20 ENCOUNTER — Ambulatory Visit (INDEPENDENT_AMBULATORY_CARE_PROVIDER_SITE_OTHER): Payer: 59 | Admitting: Family Medicine

## 2020-05-20 VITALS — BP 112/72 | HR 82 | Temp 98.3°F | Ht 60.0 in | Wt 162.0 lb

## 2020-05-20 DIAGNOSIS — E559 Vitamin D deficiency, unspecified: Secondary | ICD-10-CM

## 2020-05-20 DIAGNOSIS — Z9189 Other specified personal risk factors, not elsewhere classified: Secondary | ICD-10-CM

## 2020-05-20 DIAGNOSIS — E669 Obesity, unspecified: Secondary | ICD-10-CM | POA: Diagnosis not present

## 2020-05-20 DIAGNOSIS — E8881 Metabolic syndrome: Secondary | ICD-10-CM

## 2020-05-20 DIAGNOSIS — Z6831 Body mass index (BMI) 31.0-31.9, adult: Secondary | ICD-10-CM

## 2020-05-20 MED ORDER — VITAMIN D (ERGOCALCIFEROL) 1.25 MG (50000 UNIT) PO CAPS: 50000.0000 [IU] | ORAL_CAPSULE | ORAL | 0 refills | Status: DC

## 2020-05-21 DIAGNOSIS — F331 Major depressive disorder, recurrent, moderate: Secondary | ICD-10-CM | POA: Diagnosis not present

## 2020-05-21 DIAGNOSIS — F419 Anxiety disorder, unspecified: Secondary | ICD-10-CM | POA: Diagnosis not present

## 2020-05-21 NOTE — Progress Notes (Signed)
Chief Complaint:   OBESITY [name] is here to discuss her progress with her obesity treatment plan along with follow-up of her obesity related diagnoses. [name] is on the Category 3 Plan and states she is following her eating plan approximately 75% of the time. [name] states she is walking and using the elliptical 45 minutes 2-3 times per week.  Today's visit was #: 7 Starting weight: 171 lbs Starting date: 01/23/2020 Today's weight: 162 lbs Today's date: 05/20/2020 Total lbs lost to date: 9 lbs Total lbs lost since last in-office visit: 1 lb Total weight loss percentage to date: -5.26%  Interim History: Pt realizes when she actually eats all proteins in, she loses weight. Difficult to still follow plan and eat everything, especially the protein amounts.   Plan: Strategies discussed with pt to gt all foods in. Meal plan and prep discussed with pt.  Assessment/Plan:   1. Insulin resistance Pt is not on meds. She states she is trying to do better with carbs but says, "I love carbs."  Lab Results  Component Value Date   INSULIN 8.5 01/23/2020   Lab Results  Component Value Date   HGBA1C 5.3 12/19/2019   Plan: Reminded pt again to decrease simple carbs. Recheck fasting insulin and pt desires to wait a month or so.  2. Vitamin D deficiency [name]'s Vitamin D level was 38.7 on 01/23/2020. She is currently taking prescription vitamin D 50,000 IU each week. She denies nausea, vomiting or muscle weakness.   Ref. Range 01/23/2020 10:09  Vitamin D, 25-Hydroxy Latest Ref Range: 30.0 - 100.0 ng/mL 38.7   Plan: Refill Vit D for 1 month, as per below. Recheck Vit D in about a month. Low Vitamin D level contributes to fatigue and are associated with obesity, breast, and colon cancer. She agrees to continue to take prescription Vitamin D @50 ,000 IU every week and will follow-up for routine testing of Vitamin D, at least 2-3 times per year to avoid over-replacement.  Refill-  Vitamin D, Ergocalciferol, (DRISDOL) 1.25 MG (50000 UNIT) CAPS capsule; Take 1 capsule (50,000 Units total) by mouth every 7 (seven) days.  Dispense: 4 capsule; Refill: 0  3. At risk for deficient intake of food [name] was given approximately 10 minutes of deficit intake of food prevention counseling today. [name] is at risk for eating too few calories based on current food recall. She was encouraged to focus on meeting caloric and protein goals according to her recommended meal plan.   4. Class 1 obesity with serious comorbidity and body mass index (BMI) of 31.0 to 31.9 in adult, unspecified obesity type [name] is currently in the action stage of change. As such, her goal is to continue with weight loss efforts. She has agreed to the Category 3 Plan with protein equivalents.   Exercise goals: As is  Behavioral modification strategies: increasing lean protein intake, decreasing simple carbohydrates, increasing water intake, meal planning and cooking strategies and planning for success.  [name] has agreed to follow-up with our clinic in 2-3 weeks. She was informed of the importance of frequent follow-up visits to maximize her success with intensive lifestyle modifications for her multiple health conditions.   Objective:   Blood pressure 112/72, pulse 82, temperature 98.3 F (36.8 C), height 5' (1.524 m), weight 162 lb (73.5 kg), SpO2 99 %. Body mass index is 31.64 kg/m.  General: Cooperative, alert, well developed, in no acute distress. HEENT: Conjunctivae and lids unremarkable. Cardiovascular: Regular rhythm.  Lungs: Normal work of  breathing. Neurologic: No focal deficits.   Lab Results  Component Value Date   CREATININE 0.87 12/19/2019   BUN 16 12/19/2019   NA 140 12/19/2019   K 3.9 12/19/2019   CL 105 12/19/2019   CO2 28 12/19/2019   Lab Results  Component Value Date   ALT 16 12/19/2019   AST 19 12/19/2019   ALKPHOS 76 01/22/2016   BILITOT 0.3 12/19/2019   Lab  Results  Component Value Date   HGBA1C 5.3 12/19/2019   Lab Results  Component Value Date   INSULIN 8.5 01/23/2020   Lab Results  Component Value Date   TSH 3.52 12/19/2019   Lab Results  Component Value Date   CHOL 226 (H) 01/23/2020   HDL 68 01/23/2020   LDLCALC 140 (H) 01/23/2020   TRIG 104 01/23/2020   Lab Results  Component Value Date   WBC 8.8 12/19/2019   HGB 12.8 12/19/2019   HCT 38.5 12/19/2019   MCV 92.3 12/19/2019   PLT 245 12/19/2019   Lab Results  Component Value Date   FERRITIN 31.7 10/20/2014    Attestation Statements:   Reviewed by clinician on day of visit: allergies, medications, problem list, medical history, surgical history, family history, social history, and previous encounter notes.  Edmund Hilda, am acting as Energy manager for [employer], DO.  I have reviewed the above documentation for accuracy and completeness, and I agree with the above. [name], D.O.  The 21st Century Cures Act was signed into law in 2016 which includes the topic of electronic health records.  This provides immediate access to information in MyChart.  This includes consultation notes, operative notes, office notes, lab results and pathology reports.  If you have any questions about what you read please let us know at your next visit so we can discuss your concerns and take corrective action if need be.  We are right here with you.

## 2020-05-25 MED FILL — ARIPIPRAZOLE 2 MG TABS: 2 | 30 days supply | Qty: 30 | Fill #1

## 2020-05-27 MED FILL — BLISOVI FE 1/20 1-20 MG-MCG: 1-20 | 84 days supply | Qty: 84 | Fill #0

## 2020-06-02 DIAGNOSIS — F331 Major depressive disorder, recurrent, moderate: Secondary | ICD-10-CM | POA: Diagnosis not present

## 2020-06-02 DIAGNOSIS — F419 Anxiety disorder, unspecified: Secondary | ICD-10-CM | POA: Diagnosis not present

## 2020-06-03 ENCOUNTER — Other Ambulatory Visit: Payer: Self-pay

## 2020-06-03 ENCOUNTER — Ambulatory Visit (INDEPENDENT_AMBULATORY_CARE_PROVIDER_SITE_OTHER): Payer: 59 | Admitting: Family Medicine

## 2020-06-03 ENCOUNTER — Other Ambulatory Visit (INDEPENDENT_AMBULATORY_CARE_PROVIDER_SITE_OTHER): Payer: Self-pay | Admitting: Family Medicine

## 2020-06-03 ENCOUNTER — Encounter (INDEPENDENT_AMBULATORY_CARE_PROVIDER_SITE_OTHER): Payer: Self-pay | Admitting: Family Medicine

## 2020-06-03 VITALS — BP 112/77 | HR 84 | Temp 98.0°F | Ht 60.0 in | Wt 162.0 lb

## 2020-06-03 DIAGNOSIS — E669 Obesity, unspecified: Secondary | ICD-10-CM | POA: Diagnosis not present

## 2020-06-03 DIAGNOSIS — E559 Vitamin D deficiency, unspecified: Secondary | ICD-10-CM | POA: Diagnosis not present

## 2020-06-03 DIAGNOSIS — Z9189 Other specified personal risk factors, not elsewhere classified: Secondary | ICD-10-CM

## 2020-06-03 DIAGNOSIS — E7849 Other hyperlipidemia: Secondary | ICD-10-CM

## 2020-06-03 DIAGNOSIS — F331 Major depressive disorder, recurrent, moderate: Secondary | ICD-10-CM

## 2020-06-03 DIAGNOSIS — E8881 Metabolic syndrome: Secondary | ICD-10-CM

## 2020-06-03 DIAGNOSIS — E88819 Insulin resistance, unspecified: Secondary | ICD-10-CM

## 2020-06-03 DIAGNOSIS — Z6831 Body mass index (BMI) 31.0-31.9, adult: Secondary | ICD-10-CM | POA: Diagnosis not present

## 2020-06-03 DIAGNOSIS — E66811 Obesity, class 1: Secondary | ICD-10-CM

## 2020-06-03 HISTORY — DX: Other hyperlipidemia: E78.49

## 2020-06-03 MED ORDER — VITAMIN D (ERGOCALCIFEROL) 1.25 MG (50000 UNIT) PO CAPS: 50000.0000 [IU] | ORAL_CAPSULE | ORAL | 0 refills | Status: DC

## 2020-06-03 MED FILL — VIT D2 1.25 MG (50,000 UNIT: 1.25 MG | 28 days supply | Qty: 4 | Fill #0

## 2020-06-04 ENCOUNTER — Telehealth (INDEPENDENT_AMBULATORY_CARE_PROVIDER_SITE_OTHER): Payer: 59 | Admitting: Psychiatry

## 2020-06-04 ENCOUNTER — Other Ambulatory Visit (HOSPITAL_COMMUNITY): Payer: Self-pay | Admitting: Psychiatry

## 2020-06-04 DIAGNOSIS — G4700 Insomnia, unspecified: Secondary | ICD-10-CM

## 2020-06-04 DIAGNOSIS — F411 Generalized anxiety disorder: Secondary | ICD-10-CM | POA: Diagnosis not present

## 2020-06-04 DIAGNOSIS — F331 Major depressive disorder, recurrent, moderate: Secondary | ICD-10-CM | POA: Diagnosis not present

## 2020-06-04 MED ORDER — BUPROPION HCL ER (XL) 150 MG PO TB24
300.0000 mg | ORAL_TABLET | Freq: Every day | ORAL | 0 refills | Status: DC
Start: 2020-06-04 — End: 2020-06-04

## 2020-06-04 MED ORDER — ARIPIPRAZOLE (SENSOR) 5 MG PO TABS
5.0000 mg | ORAL_TABLET | Freq: Every day | ORAL | 1 refills | Status: DC
Start: 2020-06-04 — End: 2020-10-01

## 2020-06-04 MED ORDER — LORAZEPAM 1 MG PO TABS: 1.0000 mg | ORAL_TABLET | Freq: Every day | ORAL | 1 refills | Status: DC | PRN

## 2020-06-04 MED ORDER — BUSPIRONE HCL 15 MG PO TABS: 15.0000 mg | ORAL_TABLET | Freq: Two times a day (BID) | ORAL | 0 refills | Status: DC

## 2020-06-04 MED FILL — LORAZEPAM 1 MG TABS: 1 | 30 days supply | Qty: 30 | Fill #2

## 2020-06-04 MED FILL — ARIPIPRAZOLE 5 MG TABS: 5 | 30 days supply | Qty: 30 | Fill #0

## 2020-06-04 NOTE — Progress Notes (Signed)
Virtual Visit via Video Note  I connected with [name] on 06/04/20 at  9:30 AM EST by a video enabled telemedicine application and verified that I am speaking with the correct person using two identifiers.  Location: Patient: home Provider: home   I discussed the limitations of evaluation and management by telemedicine and the availability of in person appointments. The patient expressed understanding and agreed to proceed.  History of Present Illness: [name] feels the Abilify is helping her depression some. The severity of the depression level is down. She is no longer feeling hopeless. She continues to have racing thoughts. [name] is not sleeping well. [name] sleeps for a few hours but then wakes up in the middle of the night and is not able to fall back asleep. Her irritability remains high. Her motivation to engage in activities she used to enjoy is low. She is still spending a lot of time in bed or on the couch. She is reading self help books and going to counseling every week. She denies SI/HI. [name] had to decrease the Wellbutrin dose to 300mg  due to headaches at 450mg . She is taking the Ativan once/day for overwhelming anxiety and anger. It does help her to calm down.   Observations/Objective: Psychiatric Specialty Exam: ROS  There were no vitals taken for this visit.There is no height or weight on file to calculate BMI.  General Appearance: Casual  Eye Contact:  Good  Speech:  Clear and Coherent and Normal Rate  Volume:  Normal  Mood:  Anxious and Depressed  Affect:  Congruent  Thought Process:  Goal Directed, Linear and Descriptions of Associations: Intact  Orientation:  Full (Time, Place, and Person)  Thought Content:  Logical  Suicidal Thoughts:  No  Homicidal Thoughts:  No  Memory:  Immediate;   Good  Judgement:  Good  Insight:  Good  Psychomotor Activity:  Normal  Concentration:  Concentration: Good  Recall:  Good  Fund of Knowledge:  Good   Language:  Good  Akathisia:  No  Handed:  Right  AIMS (if indicated):     Assets:  Communication Skills Desire for Improvement Financial Resources/Insurance Housing Leisure Time Physical Health Resilience Social Support Talents/Skills Transportation Vocational/Educational  ADL's:  Intact  Cognition:  WNL  Sleep:        Assessment and Plan:  1. Major depressive disorder, recurrent episode, moderate (HCC) - increase ARIPiprazole 5 MG TABS; Take 5 mg by mouth daily.  Dispense: 30 tablet; Refill: 1 - decrease buPROPion (WELLBUTRIN XL) 150 MG 24 hr tablet; Take 2 tablets (300 mg total) by mouth daily.  Dispense: 180 tablet; Refill: 0 - busPIRone (BUSPAR) 15 MG tablet; Take 1 tablet (15 mg total) by mouth 2 (two) times daily.  Dispense: 180 tablet; Refill: 0  2. GAD (generalized anxiety disorder) - busPIRone (BUSPAR) 15 MG tablet; Take 1 tablet (15 mg total) by mouth 2 (two) times daily.  Dispense: 180 tablet; Refill: 0 - LORazepam (ATIVAN) 1 MG tablet; Take 1 tablet (1 mg total) by mouth daily as needed for anxiety.  Dispense: 30 tablet; Refill: 1  3. Insomnia, unspecified type - busPIRone (BUSPAR) 15 MG tablet; Take 1 tablet (15 mg total) by mouth 2 (two) times daily.  Dispense: 180 tablet; Refill: 0 - LORazepam (ATIVAN) 1 MG tablet; Take 1 tablet (1 mg total) by mouth daily as needed for anxiety.  Dispense: 30 tablet; Refill: 1  -order labs at next visit - she is engaging in therapy and finds it  useful  Follow Up Instructions: In 2-3 months or sooner if needed   I discussed the assessment and treatment plan with the patient. The patient was provided an opportunity to ask questions and all were answered. The patient agreed with the plan and demonstrated an understanding of the instructions.   The patient was advised to call back or seek an in-person evaluation if the symptoms worsen or if the condition fails to improve as anticipated.    Oletta Darter, MD

## 2020-06-08 NOTE — Progress Notes (Signed)
Chief Complaint:   OBESITY [name] is here to discuss her progress with her obesity treatment plan along with follow-up of her obesity related diagnoses.   Today's visit was #: 8 Starting weight: 171 lbs Starting date: 01/23/2020 Today's weight: 162 lbs Today's date: 06/03/2020 Total lbs lost to date: 9 lbs Body mass index is 31.64 kg/m.  Total weight loss percentage to date: -5.26%  Interim History: [name] says she has been having increased stress with her ex-husband over the last couple of weeks.  She has not been as good with getting meals in and following the plan.  Nutrition Plan: Category 3 Plan with protein equivalents for 50% of the time. Activity: Walking / elliptical for 30-45 minutes 4 times per week. This patient is following the prescribed meal plan meal without concerns.  Food recall appears to be consistent with the prescribed plan.  When following the plan, hunger and cravings are well controlled.    Assessment/Plan:   1. Vitamin D deficiency Not at goal. Current vitamin D is 38.7, tested on 01/23/2020. Optimal goal > 50 ng/dL.   Plan:  Continue to take prescription Vitamin D @50 ,000 IU every week as prescribed.  Will check vitamin D level at next office visit.  - Refill Vitamin D, Ergocalciferol, (DRISDOL) 1.25 MG (50000 UNIT) CAPS capsule; Take 1 capsule (50,000 Units total) by mouth every 7 (seven) days.  Dispense: 4 capsule; Refill: 0 - VITAMIN D 25 Hydroxy (Vit-D Deficiency, Fractures)  2. Insulin resistance Improving, but not optimized. Goal is HgbA1c < 5.7, fasting insulin closer to 5.  Medication: None.  Diet controlled.    Plan:  She will continue to focus on protein-rich, low simple carbohydrate foods. We reviewed the importance of hydration, regular exercise for stress reduction, and restorative sleep.  Will check FI level at next office visit.   Lab Results  Component Value Date   HGBA1C 5.3 12/19/2019   Lab Results  Component Value Date    INSULIN 8.5 01/23/2020   - Insulin, random  3. Other hyperlipidemia Course: Not at goal. Lipid-lowering medications: None.  Diet controlled.  Plan: Dietary changes: Increase soluble fiber, decrease simple carbohydrates, decrease saturated fat. Exercise changes: Moderate to vigorous-intensity aerobic activity 150 minutes per week or as tolerated. We will continue to monitor along with PCP/specialists as it pertains to her weight loss journey.  Will check FLP at next office visit.  Lab Results  Component Value Date   CHOL 226 (H) 01/23/2020   HDL 68 01/23/2020   LDLCALC 140 (H) 01/23/2020   TRIG 104 01/23/2020   Lab Results  Component Value Date   ALT 16 12/19/2019   AST 19 12/19/2019   ALKPHOS 76 01/22/2016   BILITOT 0.3 12/19/2019   - Lipid panel  4. Major depressive disorder, recurrent episode, moderate (HCC) Treatment per Dr. 12/21/2019.  She has an appointment tomorrow.  Plan:  She is seeing a counselor every week now and will continue.  Continue medications per Psychiatry.  Emotionally stable.  5. At risk for deficient intake of food [name] was given extensive education and counseling today of more than 9 minutes on risks associated with deficient food intake.  Counseled her on the importance of following our prescribed meal plan and eating adequate amounts of protein.  Discussed with Michae Kava that inadequate food intake over longer periods of time can slow their metabolism down significantly.   6. Class 1 obesity with serious comorbidity and body mass index (BMI) of  31.0 to 31.9 in adult, unspecified obesity type  Course: [name] is currently in the action stage of change. As such, her goal is to continue with weight loss efforts.   Nutrition goals: She has agreed to the Category 3 Plan.   Exercise goals: As is.  Behavioral modification strategies: increasing lean protein intake, meal planning and cooking strategies, keeping healthy foods in the home and  planning for success.  Storey has agreed to follow-up with our clinic in 2 weeks, fasting for blood work. She was informed of the importance of frequent follow-up visits to maximize her success with intensive lifestyle modifications for her multiple health conditions.   Objective:   Blood pressure 112/77, pulse 84, temperature 98 F (36.7 C), height 5' (1.524 m), weight 162 lb (73.5 kg), SpO2 100 %. Body mass index is 31.64 kg/m.  General: Cooperative, alert, well developed, in no acute distress. HEENT: Conjunctivae and lids unremarkable. Cardiovascular: Regular rhythm.  Lungs: Normal work of breathing. Neurologic: No focal deficits.   Lab Results  Component Value Date   CREATININE 0.87 12/19/2019   BUN 16 12/19/2019   NA 140 12/19/2019   K 3.9 12/19/2019   CL 105 12/19/2019   CO2 28 12/19/2019   Lab Results  Component Value Date   ALT 16 12/19/2019   AST 19 12/19/2019   ALKPHOS 76 01/22/2016   BILITOT 0.3 12/19/2019   Lab Results  Component Value Date   HGBA1C 5.3 12/19/2019   Lab Results  Component Value Date   INSULIN 8.5 01/23/2020   Lab Results  Component Value Date   TSH 3.52 12/19/2019   Lab Results  Component Value Date   CHOL 226 (H) 01/23/2020   HDL 68 01/23/2020   LDLCALC 140 (H) 01/23/2020   TRIG 104 01/23/2020   Lab Results  Component Value Date   WBC 8.8 12/19/2019   HGB 12.8 12/19/2019   HCT 38.5 12/19/2019   MCV 92.3 12/19/2019   PLT 245 12/19/2019   Lab Results  Component Value Date   FERRITIN 31.7 10/20/2014   Attestation Statements:   Reviewed by clinician on day of visit: allergies, medications, problem list, medical history, surgical history, family history, social history, and previous encounter notes.  I, Insurance claims handler, CMA, am acting as Energy manager for [employer], DO.  I have reviewed the above documentation for accuracy and completeness, and I agree with the above. [name], D.O.  The 21st Century  Cures Act was signed into law in 2016 which includes the topic of electronic health records.  This provides immediate access to information in MyChart.  This includes consultation notes, operative notes, office notes, lab results and pathology reports.  If you have any questions about what you read please let us know at your next visit so we can discuss your concerns and take corrective action if need be.  We are right here with you.

## 2020-06-11 DIAGNOSIS — F330 Major depressive disorder, recurrent, mild: Secondary | ICD-10-CM | POA: Diagnosis not present

## 2020-06-11 DIAGNOSIS — F419 Anxiety disorder, unspecified: Secondary | ICD-10-CM | POA: Diagnosis not present

## 2020-06-11 MED FILL — buPROPion HCL ER (XL) 150 M: 150 | 90 days supply | Qty: 180 | Fill #0

## 2020-06-11 MED FILL — XELJANZ XR 11 MG TB24: 11 | 30 days supply | Qty: 30 | Fill #1

## 2020-06-17 DIAGNOSIS — F331 Major depressive disorder, recurrent, moderate: Secondary | ICD-10-CM | POA: Diagnosis not present

## 2020-06-17 DIAGNOSIS — F419 Anxiety disorder, unspecified: Secondary | ICD-10-CM | POA: Diagnosis not present

## 2020-06-22 ENCOUNTER — Ambulatory Visit (INDEPENDENT_AMBULATORY_CARE_PROVIDER_SITE_OTHER): Payer: 59 | Admitting: Family Medicine

## 2020-06-24 DIAGNOSIS — F419 Anxiety disorder, unspecified: Secondary | ICD-10-CM | POA: Diagnosis not present

## 2020-06-24 DIAGNOSIS — F331 Major depressive disorder, recurrent, moderate: Secondary | ICD-10-CM | POA: Diagnosis not present

## 2020-06-25 ENCOUNTER — Other Ambulatory Visit (INDEPENDENT_AMBULATORY_CARE_PROVIDER_SITE_OTHER): Payer: Self-pay | Admitting: Family Medicine

## 2020-06-25 ENCOUNTER — Ambulatory Visit (INDEPENDENT_AMBULATORY_CARE_PROVIDER_SITE_OTHER): Payer: 59 | Admitting: Family Medicine

## 2020-06-25 ENCOUNTER — Encounter (INDEPENDENT_AMBULATORY_CARE_PROVIDER_SITE_OTHER): Payer: Self-pay | Admitting: Family Medicine

## 2020-06-25 ENCOUNTER — Other Ambulatory Visit: Payer: Self-pay

## 2020-06-25 VITALS — BP 99/66 | HR 78 | Temp 98.1°F | Ht 60.0 in | Wt 163.0 lb

## 2020-06-25 DIAGNOSIS — E669 Obesity, unspecified: Secondary | ICD-10-CM | POA: Diagnosis not present

## 2020-06-25 DIAGNOSIS — Z6832 Body mass index (BMI) 32.0-32.9, adult: Secondary | ICD-10-CM | POA: Diagnosis not present

## 2020-06-25 DIAGNOSIS — E8881 Metabolic syndrome: Secondary | ICD-10-CM | POA: Diagnosis not present

## 2020-06-25 DIAGNOSIS — Z9189 Other specified personal risk factors, not elsewhere classified: Secondary | ICD-10-CM | POA: Insufficient documentation

## 2020-06-25 DIAGNOSIS — E559 Vitamin D deficiency, unspecified: Secondary | ICD-10-CM

## 2020-06-25 DIAGNOSIS — F39 Unspecified mood [affective] disorder: Secondary | ICD-10-CM | POA: Diagnosis not present

## 2020-06-25 DIAGNOSIS — E7849 Other hyperlipidemia: Secondary | ICD-10-CM

## 2020-06-25 HISTORY — DX: Other specified personal risk factors, not elsewhere classified: Z91.89

## 2020-06-25 MED ORDER — RYBELSUS 3 MG PO TABS
ORAL_TABLET | ORAL | 0 refills | Status: DC
Start: 2020-06-25 — End: 2020-07-13

## 2020-06-25 MED ORDER — VITAMIN D (ERGOCALCIFEROL) 1.25 MG (50000 UNIT) PO CAPS: 50000.0000 [IU] | ORAL_CAPSULE | ORAL | 0 refills | Status: DC

## 2020-06-25 MED FILL — RYBELSUS 3 MG TABS: 3 | 30 days supply | Qty: 30 | Fill #0

## 2020-06-25 MED FILL — VIT D2 1.25 MG (50,000 UNIT: 1.25 MG | 28 days supply | Qty: 4 | Fill #0

## 2020-06-26 LAB — INSULIN, RANDOM: INSULIN: 8.1 u[IU]/mL (ref 2.6–24.9)

## 2020-06-26 LAB — LIPID PANEL
Chol/HDL Ratio: 3.2 ratio (ref 0.0–4.4)
Cholesterol, Total: 233 mg/dL — ABNORMAL HIGH (ref 100–199)
HDL: 73 mg/dL (ref >39)
LDL Chol Calc (NIH): 141 mg/dL — ABNORMAL HIGH (ref 0–99)
Triglycerides: 111 mg/dL (ref 0–149)
VLDL Cholesterol Cal: 19 mg/dL (ref 5–40)

## 2020-06-26 LAB — VITAMIN D 25 HYDROXY (VIT D DEFICIENCY, FRACTURES): Vit D, 25-Hydroxy: 57.7 ng/mL (ref 30.0–100.0)

## 2020-06-29 NOTE — Progress Notes (Signed)
Chief Complaint:   OBESITY [name] is here to discuss her progress with her obesity treatment plan along with follow-up of her obesity related diagnoses.   Today's visit was #: 9 Starting weight: 171 lbs Starting date: 01/23/2020 Today's weight: 163 lbs Today's date: 06/25/2020 Total lbs lost to date: 8 lbs Body mass index is 31.83 kg/m.  Total weight loss percentage to date: -4.68%  Interim History:  [name] says that her psychiatrist increased her Abilify.  She is having a difficult time keeping the weight off and controlling hunger.  She increased her exercise this past week to 7 days per week for around 30-90 minutes per session.  She is still gaining.  Plan:  For snack calories try to eat 10:1 ratio items and stick to plan 60% of the time or more.  Start Rybelsus today.  Current Meal Plan: the Category 3 Plan for 40% of the time.  Current Exercise Plan: Elliptical/walking for 30 minutes 5 times per week.  Assessment/Plan:   1. Insulin resistance Improving, but not optimized.  With hyperglycemia.  Goal is HgbA1c < 5.7, fasting insulin closer to 5.  Medication: None.    Plan:   She will continue to focus on protein-rich, low simple carbohydrate foods. We reviewed the importance of hydration, regular exercise for stress reduction, and restorative sleep.  Check fasting insulin today.  Will start Rybelsus 3 mg daily, after discussion, as per below.  Risks and benefits of Rybelsus discussed today to help with weight loss.  Lab Results  Component Value Date   HGBA1C 5.3 12/19/2019   Lab Results  Component Value Date   INSULIN 8.5 01/23/2020   - Start Semaglutide (RYBELSUS) 3 MG TABS; 1 po qd  Dispense: 30 tablet; Refill: 0  2. Other hyperlipidemia Course: Not at goal. Lipid-lowering medications: None.   Plan: Dietary changes: Increase soluble fiber, decrease simple carbohydrates, decrease saturated fat. Exercise changes: Moderate to vigorous-intensity aerobic activity  150 minutes per week or as tolerated. We will continue to monitor along with PCP/specialists as it pertains to her weight loss journey.  Will check FLP today.  She is fasting.  Lab Results  Component Value Date   ALT 16 12/19/2019   AST 19 12/19/2019   ALKPHOS 76 01/22/2016   BILITOT 0.3 12/19/2019   3. Vitamin D deficiency Improving, but not optimized. Current vitamin D is 38.7, tested on 01/23/2020. Optimal goal > 50 ng/dL.  She is taking vitamin D 50,000 IU daily.  Plan: Continue to take prescription Vitamin D @50 ,000 IU every week as prescribed.  Will refill vitamin D today, as per below.  Will also check vitamin D level today.  - Refill Vitamin D, Ergocalciferol, (DRISDOL) 1.25 MG (50000 UNIT) CAPS capsule; Take 1 capsule (50,000 Units total) by mouth every 7 (seven) days.  Dispense: 4 capsule; Refill: 0  4. Mood disorder (HCC) with emotional eating Not at goal. Medication: Wellbutrin XL 300 mg daily, Abilify 5 mg daily, Buspar 15 mg twice daily, Ativan 1 mg as needed.  Abilify was increased by Dr. recently, which is causing increased weight gain and hunger.  Plan:  Medication management per Psychiatry.  Recommend counseling in addition.  Continue exercise and prudent nutritional plan.   5. At risk for side effect of medication Due to Vora's current conditions and medications, she is at a higher risk for drug side effect.  At least 9 minutes was spent on counseling her about these concerns today.  We discussed the  benefits and potential risks of these medications, and all of patient's concerns were addressed and questions were answered.  she will call us, or their PCP or other specialists who treat their conditions with medications, with any questions or concerns that may develop.    6. Class 1 obesity with serious comorbidity and body mass index (BMI) of 32.0 to 32.9 in adult, unspecified obesity type  - Start Semaglutide (RYBELSUS) 3 MG TABS; 1 po qd  Dispense: 30 tablet;  Refill: 0  Course: [name] is currently in the action stage of change. As such, her goal is to continue with weight loss efforts.   Nutrition goals: She has agreed to the Category 3 Plan.   Exercise goals: As is.  Behavioral modification strategies: meal planning and cooking strategies, better snacking choices, emotional eating strategies and avoiding temptations.  Katoya has agreed to follow-up with our clinic in 2-2.5 weeks after starting Rybelsus. She was informed of the importance of frequent follow-up visits to maximize her success with intensive lifestyle modifications for her multiple health conditions.   [name] was informed we would discuss her lab results at her next visit unless there is a critical issue that needs to be addressed sooner. [name] agreed to keep her next visit at the agreed upon time to discuss these results.  Objective:   Blood pressure 99/66, pulse 78, temperature 98.1 F (36.7 C), height 5' (1.524 m), weight 163 lb (73.9 kg), SpO2 99 %. Body mass index is 31.83 kg/m.  General: Cooperative, alert, well developed, in no acute distress. HEENT: Conjunctivae and lids unremarkable. Cardiovascular: Regular rhythm.  Lungs: Normal work of breathing. Neurologic: No focal deficits.   Lab Results  Component Value Date   CREATININE 0.87 12/19/2019   BUN 16 12/19/2019   NA 140 12/19/2019   K 3.9 12/19/2019   CL 105 12/19/2019   CO2 28 12/19/2019   Lab Results  Component Value Date   ALT 16 12/19/2019   AST 19 12/19/2019   ALKPHOS 76 01/22/2016   BILITOT 0.3 12/19/2019   Lab Results  Component Value Date   HGBA1C 5.3 12/19/2019   Lab Results  Component Value Date   INSULIN 8.5 01/23/2020   Lab Results  Component Value Date   TSH 3.52 12/19/2019   Lab Results  Component Value Date   WBC 8.8 12/19/2019   HGB 12.8 12/19/2019   HCT 38.5 12/19/2019   MCV 92.3 12/19/2019   PLT 245 12/19/2019   Lab Results  Component Value Date    FERRITIN 31.7 10/20/2014   Attestation Statements:   Reviewed by clinician on day of visit: allergies, medications, problem list, medical history, surgical history, family history, social history, and previous encounter notes.  I, Insurance claims handler, CMA, am acting as Energy manager for [employer], DO.  I have reviewed the above documentation for accuracy and completeness, and I agree with the above. [name], D.O.  The 21st Century Cures Act was signed into law in 2016 which includes the topic of electronic health records.  This provides immediate access to information in MyChart.  This includes consultation notes, operative notes, office notes, lab results and pathology reports.  If you have any questions about what you read please let us know at your next visit so we can discuss your concerns and take corrective action if need be.  We are right here with you.

## 2020-07-01 DIAGNOSIS — F330 Major depressive disorder, recurrent, mild: Secondary | ICD-10-CM | POA: Diagnosis not present

## 2020-07-01 DIAGNOSIS — F419 Anxiety disorder, unspecified: Secondary | ICD-10-CM | POA: Diagnosis not present

## 2020-07-07 DIAGNOSIS — F331 Major depressive disorder, recurrent, moderate: Secondary | ICD-10-CM | POA: Diagnosis not present

## 2020-07-07 DIAGNOSIS — F419 Anxiety disorder, unspecified: Secondary | ICD-10-CM | POA: Diagnosis not present

## 2020-07-13 ENCOUNTER — Other Ambulatory Visit: Payer: Self-pay

## 2020-07-13 ENCOUNTER — Ambulatory Visit (INDEPENDENT_AMBULATORY_CARE_PROVIDER_SITE_OTHER): Payer: 59 | Admitting: Family Medicine

## 2020-07-13 ENCOUNTER — Encounter (INDEPENDENT_AMBULATORY_CARE_PROVIDER_SITE_OTHER): Payer: Self-pay | Admitting: Family Medicine

## 2020-07-13 ENCOUNTER — Other Ambulatory Visit (INDEPENDENT_AMBULATORY_CARE_PROVIDER_SITE_OTHER): Payer: Self-pay | Admitting: Family Medicine

## 2020-07-13 VITALS — BP 115/76 | HR 93 | Temp 98.1°F | Ht 60.0 in | Wt 165.0 lb

## 2020-07-13 DIAGNOSIS — F419 Anxiety disorder, unspecified: Secondary | ICD-10-CM | POA: Diagnosis not present

## 2020-07-13 DIAGNOSIS — Z9189 Other specified personal risk factors, not elsewhere classified: Secondary | ICD-10-CM | POA: Diagnosis not present

## 2020-07-13 DIAGNOSIS — Z6833 Body mass index (BMI) 33.0-33.9, adult: Secondary | ICD-10-CM

## 2020-07-13 DIAGNOSIS — F331 Major depressive disorder, recurrent, moderate: Secondary | ICD-10-CM | POA: Diagnosis not present

## 2020-07-13 DIAGNOSIS — E669 Obesity, unspecified: Secondary | ICD-10-CM | POA: Diagnosis not present

## 2020-07-13 DIAGNOSIS — E7849 Other hyperlipidemia: Secondary | ICD-10-CM

## 2020-07-13 DIAGNOSIS — E8881 Metabolic syndrome: Secondary | ICD-10-CM | POA: Diagnosis not present

## 2020-07-13 DIAGNOSIS — E559 Vitamin D deficiency, unspecified: Secondary | ICD-10-CM | POA: Diagnosis not present

## 2020-07-13 HISTORY — DX: Body mass index (BMI) 33.0-33.9, adult: Z68.33

## 2020-07-13 MED ORDER — RYBELSUS 7 MG PO TABS: ORAL_TABLET | ORAL | 0 refills | Status: DC

## 2020-07-13 MED ORDER — VITAMIN D (ERGOCALCIFEROL) 1.25 MG (50000 UNIT) PO CAPS: 50000.0000 [IU] | ORAL_CAPSULE | ORAL | 0 refills | Status: DC

## 2020-07-13 MED FILL — RYBELSUS 7 MG TABS: 7 | 30 days supply | Qty: 30 | Fill #0

## 2020-07-16 MED FILL — ARIPIPRAZOLE 5 MG TABS: 5 | 30 days supply | Qty: 30 | Fill #1

## 2020-07-16 MED FILL — XELJANZ XR 11 MG TB24: 11 | 30 days supply | Qty: 30 | Fill #2

## 2020-07-20 NOTE — Progress Notes (Signed)
Chief Complaint:   OBESITY [name] is here to discuss her progress with her obesity treatment plan along with follow-up of her obesity related diagnoses.   Today's visit was #: 10 Starting weight: 171 lbs Starting date: 01/23/2020 Today's weight: 165 lbs Today's date: 07/13/2020 Total lbs lost to date: 6 lbs Body mass index is 32.22 kg/m.  Total weight loss percentage to date: -3.51%  Interim History:  [name] turned 35 years old recently, and [name] did more off plan eating and had party foods in her house over the last couple of weeks due to the birthday party.  No issues with the plan.  She had labs done at her last office visit and is here to review them.  Current Meal Plan: the Category 3 Plan for 60% of the time.  Current Exercise Plan: Elliptical for 30 minutes 6 times per week. Current Anti-Obesity Medications: Rybelsus 3 mg daily. Side effects: None.  Assessment/Plan:   No orders of the defined types were placed in this encounter.   Medications Discontinued During This Encounter  Medication Reason  . Semaglutide (RYBELSUS) 3 MG TABS Dose change  . Vitamin D, Ergocalciferol, (DRISDOL) 1.25 MG (50000 UNIT) CAPS capsule Reorder     Meds ordered this encounter  Medications  . Vitamin D, Ergocalciferol, (DRISDOL) 1.25 MG (50000 UNIT) CAPS capsule    Sig: Take 1 capsule (50,000 Units total) by mouth every 7 (seven) days.    Dispense:  4 capsule    Refill:  0  . Semaglutide (RYBELSUS) 7 MG TABS    Sig: 1 po qd    Dispense:  30 tablet    Refill:  0     1. Insulin resistance At goal. Goal is HgbA1c < 5.7, fasting insulin closer to 5.  Medication: Rybelsus 3 mg daily.  At last office visit, we started her on Rybelsus.  She is tolerating it well with no side effects.  Working well.  Feels fuller sooner, and she says it is somewhat helping her to stay on plan.  Plan: Discussed labs with patient today.  She will continue to focus on protein-rich, low  simple carbohydrate foods. We reviewed the importance of hydration, regular exercise for stress reduction, and restorative sleep.  Will increase Rybelsus from 3 mg to 7 mg daily.  Improved FI level.  Lab Results  Component Value Date   HGBA1C 5.3 12/19/2019   Lab Results  Component Value Date   INSULIN 8.1 06/25/2020   INSULIN 8.5 01/23/2020   - Increase and refill Semaglutide (RYBELSUS) 7 MG TABS; 1 po qd  Dispense: 30 tablet; Refill: 0  2. Vitamin D deficiency At goal. Current vitamin D is 57.7, tested on 06/25/2020. Optimal goal > 50 ng/dL.  She is taking vitamin D 50,000 IU weekly.  Plan:  Discussed labs with patient today.  Continue to take prescription Vitamin D @50 ,000 IU every week as prescribed.  Follow-up for routine testing of Vitamin D, at least 2-3 times per year to avoid over-replacement.  - Refill Vitamin D, Ergocalciferol, (DRISDOL) 1.25 MG (50000 UNIT) CAPS capsule; Take 1 capsule (50,000 Units total) by mouth every 7 (seven) days.  Dispense: 4 capsule; Refill: 0  3. Other hyperlipidemia Course: Not at goal. Lipid-lowering medications: None.  Improved HDL.  Essentially no change in LDL.  Plan:  Discussed labs with patient today.  Dietary changes: Increase soluble fiber, decrease simple carbohydrates, decrease saturated fat. Exercise changes: Moderate to vigorous-intensity aerobic activity 150 minutes per week  or as tolerated. We will continue to monitor along with PCP/specialists as it pertains to her weight loss journey.  Overall slight improvement in cholesterol.  Continue prudent nutritional plan, exercise, and weight loss.  Lab Results  Component Value Date   CHOL 233 (H) 06/25/2020   HDL 73 06/25/2020   LDLCALC 141 (H) 06/25/2020   TRIG 111 06/25/2020   CHOLHDL 3.2 06/25/2020   Lab Results  Component Value Date   ALT 16 12/19/2019   AST 19 12/19/2019   ALKPHOS 76 01/22/2016   BILITOT 0.3 12/19/2019   4. At risk for impaired metabolic function Due to  [name]'s current state of health and medical condition(s), she is at a significantly higher risk for impaired metabolic function.   At least 9 minutes was spent on counseling [name] about these concerns today.  This places the patient at a much greater risk to subsequently develop cardio-pulmonary conditions that can negatively affect the patient's quality of life.  I stressed the importance of reversing these risks factors.  The initial goal is to lose at least 5-10% of starting weight to help reduce risk factors.  Counseling:  Intensive lifestyle modifications discussed with [name] as the most appropriate first line treatment.  she will continue to work on diet, exercise, and weight loss efforts.  We will continue to reassess these conditions on a fairly regular basis in an attempt to decrease the patient's overall morbidity and mortality.  5. Obesity, current BMI 32.4  Course: [name] is currently in the action stage of change. As such, her goal is to continue with weight loss efforts.   Nutrition goals: She has agreed to the Category 3 Plan.   Exercise goals: As is.  Behavioral modification strategies: increasing lean protein intake, meal planning and cooking strategies and planning for success.  [name] has agreed to follow-up with our clinic in 3 weeks. She was informed of the importance of frequent follow-up visits to maximize her success with intensive lifestyle modifications for her multiple health conditions.   Objective:   Blood pressure 115/76, pulse 93, temperature 98.1 F (36.7 C), height 5' (1.524 m), weight 165 lb (74.8 kg), SpO2 99 %. Body mass index is 32.22 kg/m.  General: Cooperative, alert, well developed, in no acute distress. HEENT: Conjunctivae and lids unremarkable. Cardiovascular: Regular rhythm.  Lungs: Normal work of breathing. Neurologic: No focal deficits.   Lab Results  Component Value Date   CREATININE 0.87 12/19/2019   BUN 16 12/19/2019    NA 140 12/19/2019   K 3.9 12/19/2019   CL 105 12/19/2019   CO2 28 12/19/2019   Lab Results  Component Value Date   ALT 16 12/19/2019   AST 19 12/19/2019   ALKPHOS 76 01/22/2016   BILITOT 0.3 12/19/2019   Lab Results  Component Value Date   HGBA1C 5.3 12/19/2019   Lab Results  Component Value Date   INSULIN 8.1 06/25/2020   INSULIN 8.5 01/23/2020   Lab Results  Component Value Date   TSH 3.52 12/19/2019   Lab Results  Component Value Date   CHOL 233 (H) 06/25/2020   HDL 73 06/25/2020   LDLCALC 141 (H) 06/25/2020   TRIG 111 06/25/2020   CHOLHDL 3.2 06/25/2020   Lab Results  Component Value Date   WBC 8.8 12/19/2019   HGB 12.8 12/19/2019   HCT 38.5 12/19/2019   MCV 92.3 12/19/2019   PLT 245 12/19/2019   Lab Results  Component Value Date   FERRITIN 31.7 10/20/2014  Attestation Statements:   Reviewed by clinician on day of visit: allergies, medications, problem list, medical history, surgical history, family history, social history, and previous encounter notes.  I, Water quality scientist, CMA, am acting as Location manager for [employer], DO.  I have reviewed the above documentation for accuracy and completeness, and I agree with the above. [name], D.O.  The [place] was signed into law in 2016 which includes the topic of electronic health records.  This provides immediate access to information in MyChart.  This includes consultation notes, operative notes, office notes, lab results and pathology reports.  If you have any questions about what you read please let us know at your next visit so we can discuss your concerns and take corrective action if need be.  We are right here with you.

## 2020-07-22 DIAGNOSIS — F330 Major depressive disorder, recurrent, mild: Secondary | ICD-10-CM | POA: Diagnosis not present

## 2020-07-22 DIAGNOSIS — F419 Anxiety disorder, unspecified: Secondary | ICD-10-CM | POA: Diagnosis not present

## 2020-07-23 ENCOUNTER — Other Ambulatory Visit (HOSPITAL_COMMUNITY): Payer: Self-pay

## 2020-07-28 ENCOUNTER — Other Ambulatory Visit: Payer: Self-pay | Admitting: Obstetrics and Gynecology

## 2020-07-28 ENCOUNTER — Other Ambulatory Visit (HOSPITAL_COMMUNITY): Payer: Self-pay

## 2020-07-29 ENCOUNTER — Other Ambulatory Visit (HOSPITAL_COMMUNITY): Payer: Self-pay

## 2020-07-30 ENCOUNTER — Other Ambulatory Visit (HOSPITAL_COMMUNITY): Payer: Self-pay

## 2020-07-31 ENCOUNTER — Other Ambulatory Visit (HOSPITAL_COMMUNITY): Payer: Self-pay

## 2020-07-31 ENCOUNTER — Other Ambulatory Visit: Payer: Self-pay | Admitting: Obstetrics and Gynecology

## 2020-07-31 MED ORDER — NORETHIN ACE-ETH ESTRAD-FE 1-20 MG-MCG PO TABS
1.0000 | ORAL_TABLET | Freq: Every day | ORAL | 0 refills | Status: DC
  Filled 2020-07-31: qty 28, 28d supply, fill #0

## 2020-07-31 MED ORDER — NORETHIN ACE-ETH ESTRAD-FE 1-20 MG-MCG PO TABS
ORAL_TABLET | ORAL | 1 refills | Status: DC
  Filled 2020-07-31: qty 28, 28d supply, fill #0

## 2020-08-03 ENCOUNTER — Other Ambulatory Visit (HOSPITAL_COMMUNITY): Payer: Self-pay

## 2020-08-03 ENCOUNTER — Ambulatory Visit (INDEPENDENT_AMBULATORY_CARE_PROVIDER_SITE_OTHER): Payer: 59 | Admitting: Family Medicine

## 2020-08-05 ENCOUNTER — Other Ambulatory Visit (HOSPITAL_COMMUNITY): Payer: Self-pay

## 2020-08-06 DIAGNOSIS — M2550 Pain in unspecified joint: Secondary | ICD-10-CM | POA: Diagnosis not present

## 2020-08-06 DIAGNOSIS — E669 Obesity, unspecified: Secondary | ICD-10-CM | POA: Diagnosis not present

## 2020-08-06 DIAGNOSIS — R5382 Chronic fatigue, unspecified: Secondary | ICD-10-CM | POA: Diagnosis not present

## 2020-08-06 DIAGNOSIS — Z6832 Body mass index (BMI) 32.0-32.9, adult: Secondary | ICD-10-CM | POA: Diagnosis not present

## 2020-08-06 DIAGNOSIS — M0609 Rheumatoid arthritis without rheumatoid factor, multiple sites: Secondary | ICD-10-CM | POA: Diagnosis not present

## 2020-08-07 ENCOUNTER — Other Ambulatory Visit (HOSPITAL_COMMUNITY): Payer: Self-pay

## 2020-08-17 ENCOUNTER — Other Ambulatory Visit (HOSPITAL_COMMUNITY): Payer: Self-pay

## 2020-08-18 ENCOUNTER — Other Ambulatory Visit (HOSPITAL_COMMUNITY): Payer: Self-pay

## 2020-08-19 ENCOUNTER — Other Ambulatory Visit (HOSPITAL_COMMUNITY): Payer: Self-pay

## 2020-08-19 ENCOUNTER — Other Ambulatory Visit (INDEPENDENT_AMBULATORY_CARE_PROVIDER_SITE_OTHER): Payer: Self-pay | Admitting: Family Medicine

## 2020-08-19 ENCOUNTER — Ambulatory Visit: Payer: 59 | Attending: Family Medicine | Admitting: Pharmacist

## 2020-08-19 ENCOUNTER — Other Ambulatory Visit: Payer: Self-pay

## 2020-08-19 DIAGNOSIS — Z79899 Other long term (current) drug therapy: Secondary | ICD-10-CM

## 2020-08-19 DIAGNOSIS — E8881 Metabolic syndrome: Secondary | ICD-10-CM

## 2020-08-19 MED ORDER — RINVOQ 15 MG PO TB24
ORAL_TABLET | ORAL | 2 refills | Status: DC
  Filled 2020-08-19: qty 30, fill #0
  Filled 2020-08-19: qty 30, 30d supply, fill #0
  Filled 2020-09-14: qty 30, 30d supply, fill #1

## 2020-08-19 MED ORDER — RINVOQ 15 MG PO TB24: ORAL_TABLET | ORAL | 2 refills | Status: DC

## 2020-08-19 NOTE — Progress Notes (Signed)
  S: Patient presents today for review of their specialty medication.   Patient is switching from [place] to Rinvoq for rheumatoid arthritis. Patient is managed by Azucena Fallen for this.   Dosing: Adult  Note: May be used as monotherapy or in combination with methotrexate or other nonbiologic disease-modifying antirheumatic drugs (DMARDs); use in combination with biologic DMARDS or potent immunosuppressants (eg, azathioprine, cyclosporine) is not recommended. Do not initiate therapy in patients with an absolute lymphocyte count <500/mm3, ANC <1,000/mm3, or hemoglobin <8 g/dL. Rheumatoid arthritis: Oral: 15 mg once daily.  Adherence: has not yet started    Efficacy:  has not yet started    Monitoring: S/sx thromboembolism: has not yet started   S/sx malignancy: has not yet started   S/sx of infection: has not yet started    Current adverse effects: has not yet started      O:     Lab Results  Component Value Date   WBC 8.8 12/19/2019   HGB 12.8 12/19/2019   HCT 38.5 12/19/2019   MCV 92.3 12/19/2019   PLT 245 12/19/2019      Chemistry      Component Value Date/Time   NA 140 12/19/2019 0946   K 3.9 12/19/2019 0946   CL 105 12/19/2019 0946   CO2 28 12/19/2019 0946   BUN 16 12/19/2019 0946   CREATININE 0.87 12/19/2019 0946      Component Value Date/Time   CALCIUM 9.4 12/19/2019 0946   ALKPHOS 76 01/22/2016 1038   AST 19 12/19/2019 0946   ALT 16 12/19/2019 0946   BILITOT 0.3 12/19/2019 0946      Lab Results  Component Value Date   CHOL 233 (H) 06/25/2020   HDL 73 06/25/2020   LDLCALC 141 (H) 06/25/2020   TRIG 111 06/25/2020   CHOLHDL 3.2 06/25/2020    A/P: 1. Medication review: patient currently prescribed Rinvoq for rheumatoid arthritis. Reviewed the medication with the patient, including the following: Rinvoq is a medication used to treat rheumatoid arthritis. Administer with or without food. Swallow tablet whole; do not crush, split, or chew. Possible  adverse effects include increased risk of infection, GI upset, hematologic toxicity, hepatic effects, lipid abnormalities, increased risk of malignancy, thromboembolism. Avoid live vaccinations. No recommendations for any changes.  Butch Penny, PharmD, P[place]y [place], CPP Clinical Pharmacist [facility] [phone]

## 2020-08-20 ENCOUNTER — Other Ambulatory Visit (HOSPITAL_COMMUNITY): Payer: Self-pay

## 2020-08-20 ENCOUNTER — Telehealth (HOSPITAL_COMMUNITY): Payer: 59 | Admitting: Psychiatry

## 2020-08-20 ENCOUNTER — Ambulatory Visit: Payer: 59 | Admitting: Pharmacist

## 2020-08-26 ENCOUNTER — Other Ambulatory Visit: Payer: Self-pay

## 2020-08-26 ENCOUNTER — Other Ambulatory Visit (HOSPITAL_COMMUNITY): Payer: Self-pay

## 2020-08-26 ENCOUNTER — Encounter (INDEPENDENT_AMBULATORY_CARE_PROVIDER_SITE_OTHER): Payer: Self-pay | Admitting: Family Medicine

## 2020-08-26 ENCOUNTER — Ambulatory Visit (INDEPENDENT_AMBULATORY_CARE_PROVIDER_SITE_OTHER): Payer: PRIVATE HEALTH INSURANCE | Admitting: Family Medicine

## 2020-08-26 VITALS — BP 101/67 | HR 73 | Temp 98.0°F | Ht 60.0 in | Wt 159.0 lb

## 2020-08-26 DIAGNOSIS — Z6833 Body mass index (BMI) 33.0-33.9, adult: Secondary | ICD-10-CM | POA: Diagnosis not present

## 2020-08-26 DIAGNOSIS — E559 Vitamin D deficiency, unspecified: Secondary | ICD-10-CM | POA: Diagnosis not present

## 2020-08-26 DIAGNOSIS — E669 Obesity, unspecified: Secondary | ICD-10-CM | POA: Diagnosis not present

## 2020-08-26 DIAGNOSIS — E8881 Metabolic syndrome: Secondary | ICD-10-CM

## 2020-08-26 DIAGNOSIS — Z9189 Other specified personal risk factors, not elsewhere classified: Secondary | ICD-10-CM

## 2020-08-26 MED ORDER — SEMAGLUTIDE 7 MG PO TABS
1.0000 | ORAL_TABLET | Freq: Every day | ORAL | 0 refills | Status: DC
  Filled 2020-08-26: qty 30, 30d supply, fill #0

## 2020-08-26 MED ORDER — VITAMIN D (ERGOCALCIFEROL) 1.25 MG (50000 UNIT) PO CAPS
ORAL_CAPSULE | ORAL | 0 refills | Status: DC
  Filled 2020-08-26: qty 4, 28d supply, fill #0

## 2020-08-26 MED FILL — Lorazepam Tab 1 MG: ORAL | 30 days supply | Qty: 30 | Fill #0 | Status: AC

## 2020-08-27 ENCOUNTER — Other Ambulatory Visit (HOSPITAL_COMMUNITY): Payer: Self-pay

## 2020-09-03 NOTE — Progress Notes (Signed)
Chief Complaint:   OBESITY [name] is here to discuss her progress with her obesity treatment plan along with follow-up of her obesity related diagnoses.   Today's visit was #: 11 Starting weight: 171 lbs Starting date: 01/23/2020 Today's weight: 159 lbs Today's date: 08/26/2020 Weight change since last visit: 6 lbs Total lbs lost to date: 12 lbs Body mass index is 31.05 kg/m.  Total weight loss percentage to date: -7.02%  Interim History:  [name] has lost 6 pounds since her last office visit ~7 weeks ago.  She is a lot better with portion control.  More compliant with getting proteins in and eating on plan.  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   Current Meal Plan: the Category 3 Plan for 75% of the time.  Current Exercise Plan: Elliptical for 30 minutes 3 times per week. Current Anti-Obesity Medications: Rybelsus 7 mg daily. Side effects: None.  Assessment/Plan:   No orders of the defined types were placed in this encounter.   Medications Discontinued During This Encounter  Medication Reason  . amoxicillin-clavulanate (AUGMENTIN) 875-125 MG tablet   . ARIPiprazole (ABILIFY) 5 MG tablet   . fluconazole (DIFLUCAN) 150 MG tablet   . norethindrone-ethinyl estradiol (LOESTRIN FE) 1-20 MG-MCG tablet   . Semaglutide 7 MG TABS Reorder  . Vitamin D, Ergocalciferol, (DRISDOL) 1.25 MG (50000 UNIT) CAPS capsule Reorder     Meds ordered this encounter  Medications  . Semaglutide 7 MG TABS    Sig: TAKE 1 TABLET BY MOUTH ONCE A DAY    Dispense:  30 tablet    Refill:  0  . Vitamin D, Ergocalciferol, (DRISDOL) 1.25 MG (50000 UNIT) CAPS capsule    Sig: TAKE 1 CAPSULE BY MOUTH EVERY 7 DAYS    Dispense:  4 capsule    Refill:  0    1. Insulin resistance Not at goal. Goal is HgbA1c < 5.7, fasting insulin closer to 5.  Medication: Rybelsus 7 mg daily.  At last office visit, we increased her dose of Rybelsus, and she says it helps a lot with portion control.   Tolerating well, without side effects.  Plan:  Will refill Rybelsus today, as per below.  She will continue to focus on protein-rich, low simple carbohydrate foods. We reviewed the importance of hydration, regular exercise for stress reduction, and restorative sleep.   Lab Results  Component Value Date   HGBA1C 5.3 12/19/2019   Lab Results  Component Value Date   INSULIN 8.1 06/25/2020   INSULIN 8.5 01/23/2020   - Refill Semaglutide 7 MG TABS; TAKE 1 TABLET BY MOUTH ONCE A DAY  Dispense: 30 tablet; Refill: 0  2. Vitamin D deficiency At goal. Current vitamin D is 57.7, tested on 06/25/2020. Optimal goal > 50 ng/dL.  She is taking vitamin D 50,000 IU weekly.  Tolerating well.  Compliance is good.  Plan: Continue to take prescription Vitamin D @50 ,000 IU every week as prescribed.  Follow-up for routine testing of Vitamin D, at least 2-3 times per year to avoid over-replacement.  - Refill Vitamin D, Ergocalciferol, (DRISDOL) 1.25 MG (50000 UNIT) CAPS capsule; TAKE 1 CAPSULE BY MOUTH EVERY 7 DAYS  Dispense: 4 capsule; Refill: 0  3. At risk for deficient intake of food [name] was given extensive education and counseling today of more than 8 minutes on risks associated with deficient food intake.  Counseled her on the importance of following our prescribed meal plan and eating adequate amounts  of protein.  Discussed with [name] that inadequate food intake over longer periods of time can slow their metabolism down significantly.   4. Obesity, current BMI 31.1  Course: [name] is currently in the action stage of change. As such, her goal is to continue with weight loss efforts.   Nutrition goals: She has agreed to the Category 3 Plan.   Exercise goals: As is.  Behavioral modification strategies: increasing lean protein intake, decreasing simple carbohydrates, better snacking choices and planning for success.  [name] has agreed to follow-up with our clinic in 2 weeks. She was  informed of the importance of frequent follow-up visits to maximize her success with intensive lifestyle modifications for her multiple health conditions.   Objective:   Blood pressure 101/67, pulse 73, temperature 98 F (36.7 C), height 5' (1.524 m), weight 159 lb (72.1 kg), SpO2 98 %. Body mass index is 31.05 kg/m.  General: Cooperative, alert, well developed, in no acute distress. HEENT: Conjunctivae and lids unremarkable. Cardiovascular: Regular rhythm.  Lungs: Normal work of breathing. Neurologic: No focal deficits.   Lab Results  Component Value Date   CREATININE 0.87 12/19/2019   BUN 16 12/19/2019   NA 140 12/19/2019   K 3.9 12/19/2019   CL 105 12/19/2019   CO2 28 12/19/2019   Lab Results  Component Value Date   ALT 16 12/19/2019   AST 19 12/19/2019   ALKPHOS 76 01/22/2016   BILITOT 0.3 12/19/2019   Lab Results  Component Value Date   HGBA1C 5.3 12/19/2019   Lab Results  Component Value Date   INSULIN 8.1 06/25/2020   INSULIN 8.5 01/23/2020   Lab Results  Component Value Date   TSH 3.52 12/19/2019   Lab Results  Component Value Date   CHOL 233 (H) 06/25/2020   HDL 73 06/25/2020   LDLCALC 141 (H) 06/25/2020   TRIG 111 06/25/2020   CHOLHDL 3.2 06/25/2020   Lab Results  Component Value Date   WBC 8.8 12/19/2019   HGB 12.8 12/19/2019   HCT 38.5 12/19/2019   MCV 92.3 12/19/2019   PLT 245 12/19/2019   Lab Results  Component Value Date   FERRITIN 31.7 10/20/2014   Attestation Statements:   Reviewed by clinician on day of visit: allergies, medications, problem list, medical history, surgical history, family history, social history, and previous encounter notes.  I, Insurance claims handler, CMA, am acting as Energy manager for [employer], DO.  I have reviewed the above documentation for accuracy and completeness, and I agree with the above. [name], D.O.  The 21st Century Cures Act was signed into law in 2016 which includes the topic of  electronic health records.  This provides immediate access to information in MyChart.  This includes consultation notes, operative notes, office notes, lab results and pathology reports.  If you have any questions about what you read please let us know at your next visit so we can discuss your concerns and take corrective action if need be.  We are right here with you.

## 2020-09-08 ENCOUNTER — Ambulatory Visit (INDEPENDENT_AMBULATORY_CARE_PROVIDER_SITE_OTHER): Payer: PRIVATE HEALTH INSURANCE | Admitting: Family Medicine

## 2020-09-08 ENCOUNTER — Encounter (INDEPENDENT_AMBULATORY_CARE_PROVIDER_SITE_OTHER): Payer: Self-pay | Admitting: Family Medicine

## 2020-09-08 ENCOUNTER — Other Ambulatory Visit: Payer: Self-pay | Admitting: Obstetrics and Gynecology

## 2020-09-08 ENCOUNTER — Other Ambulatory Visit (HOSPITAL_COMMUNITY): Payer: Self-pay

## 2020-09-08 ENCOUNTER — Other Ambulatory Visit: Payer: Self-pay

## 2020-09-08 VITALS — BP 107/71 | HR 82 | Temp 97.4°F | Ht 60.0 in | Wt 159.0 lb

## 2020-09-08 DIAGNOSIS — E8881 Metabolic syndrome: Secondary | ICD-10-CM

## 2020-09-08 DIAGNOSIS — Z9189 Other specified personal risk factors, not elsewhere classified: Secondary | ICD-10-CM

## 2020-09-08 DIAGNOSIS — E669 Obesity, unspecified: Secondary | ICD-10-CM

## 2020-09-08 DIAGNOSIS — E559 Vitamin D deficiency, unspecified: Secondary | ICD-10-CM

## 2020-09-08 DIAGNOSIS — Z6833 Body mass index (BMI) 33.0-33.9, adult: Secondary | ICD-10-CM | POA: Diagnosis not present

## 2020-09-08 MED ORDER — VITAMIN D (ERGOCALCIFEROL) 1.25 MG (50000 UNIT) PO CAPS
ORAL_CAPSULE | ORAL | 0 refills | Status: DC
  Filled 2020-09-08: qty 4, fill #0
  Filled 2020-09-30: qty 4, 28d supply, fill #0

## 2020-09-08 MED ORDER — NORETHIN ACE-ETH ESTRAD-FE 1-20 MG-MCG PO TABS
ORAL_TABLET | ORAL | 0 refills | Status: DC
  Filled 2020-09-08: qty 28, 28d supply, fill #0

## 2020-09-08 MED ORDER — SEMAGLUTIDE 7 MG PO TABS
1.0000 | ORAL_TABLET | Freq: Every day | ORAL | 0 refills | Status: DC
  Filled 2020-09-08 – 2020-09-15 (×2): qty 30, 30d supply, fill #0

## 2020-09-08 MED FILL — Buspirone HCl Tab 15 MG: ORAL | 90 days supply | Qty: 180 | Fill #0 | Status: AC

## 2020-09-09 ENCOUNTER — Other Ambulatory Visit (HOSPITAL_COMMUNITY): Payer: Self-pay

## 2020-09-10 ENCOUNTER — Telehealth (HOSPITAL_COMMUNITY): Payer: 59 | Admitting: Psychiatry

## 2020-09-10 ENCOUNTER — Other Ambulatory Visit (HOSPITAL_COMMUNITY): Payer: Self-pay

## 2020-09-14 ENCOUNTER — Other Ambulatory Visit (HOSPITAL_COMMUNITY): Payer: Self-pay

## 2020-09-14 MED ORDER — NORETHIN ACE-ETH ESTRAD-FE 1-20 MG-MCG PO TABS
ORAL_TABLET | ORAL | 1 refills | Status: DC
  Filled 2020-09-14: qty 112, 84d supply, fill #0

## 2020-09-15 ENCOUNTER — Other Ambulatory Visit (HOSPITAL_COMMUNITY): Payer: Self-pay

## 2020-09-15 NOTE — Progress Notes (Signed)
Chief Complaint:   OBESITY [name] is here to discuss her progress with her obesity treatment plan along with follow-up of her obesity related diagnoses.   Today's visit was #: 12 Starting weight: 171 lbs Starting date: 01/23/2020 Today's weight: 159 lbs Today's date: 09/08/2020 Weight change since last visit: 0 Total lbs lost to date: 12 lbs Body mass index is 31.05 kg/m.  Total weight loss percentage to date: -7.02%  Interim History:  [name] had her son's birthday recently and they celebrated and his [name] party is this weekend.  Otherwise, no issues with the plan.  She says it works well for her.  Current Meal Plan: the Category 3 Plan for 60% of the time.  Current Exercise Plan: Elliptical for 30 minutes 3 times per week. Current Anti-Obesity Medications: Rybelsus 7 mg daily. Side effects: None.  Assessment/Plan:   Medications Discontinued During This Encounter  Medication Reason  . Semaglutide 7 MG TABS Reorder  . Vitamin D, Ergocalciferol, (DRISDOL) 1.25 MG (50000 UNIT) CAPS capsule Reorder   Meds ordered this encounter  Medications  . Vitamin D, Ergocalciferol, (DRISDOL) 1.25 MG (50000 UNIT) CAPS capsule    Sig: TAKE 1 CAPSULE BY MOUTH EVERY 7 DAYS    Dispense:  4 capsule    Refill:  0  . Semaglutide 7 MG TABS    Sig: TAKE 1 TABLET BY MOUTH ONCE A DAY    Dispense:  30 tablet    Refill:  0    1. Insulin resistance Not at goal. Goal is HgbA1c < 5.7, fasting insulin closer to 5.  Medication: Rybelsus 7 mg daily.  Rybelsus is working well and is causing her to feel fuller soner.  Plan:  Will refill Rybelsus today, as per below.  She will continue to focus on protein-rich, low simple carbohydrate foods. We reviewed the importance of hydration, regular exercise for stress reduction, and restorative sleep.   Lab Results  Component Value Date   HGBA1C 5.3 12/19/2019   Lab Results  Component Value Date   INSULIN 8.1 06/25/2020   INSULIN 8.5 01/23/2020   -  Refill Semaglutide 7 MG TABS; TAKE 1 TABLET BY MOUTH ONCE A DAY  Dispense: 30 tablet; Refill: 0  2. Vitamin D deficiency At goal. Current vitamin D is 57.7, tested on 06/25/2020. Optimal goal > 50 ng/dL.  She is taking vitamin D 50,000 IU weekly.  Plan: Continue to take prescription Vitamin D @50 ,000 IU every week as prescribed.  Follow-up for routine testing of Vitamin D, at least 2-3 times per year to avoid over-replacement.  - Refill Vitamin D, Ergocalciferol, (DRISDOL) 1.25 MG (50000 UNIT) CAPS capsule; TAKE 1 CAPSULE BY MOUTH EVERY 7 DAYS  Dispense: 4 capsule; Refill: 0  3. At risk for deficient intake of food [name] was given extensive education and counseling today of more than 9 minutes on risks associated with deficient food intake.  She still skips food/meals at times.  Counseled her on the importance of following our prescribed meal plan and eating adequate amounts of protein.  Discussed with that inadequate food intake over longer periods of time can slow their metabolism down significantly.   4. Obesity, current BMI 31.2  Course: [name] is currently in the action stage of change. As such, her goal is to continue with weight loss efforts.   Nutrition goals: She has agreed to the Category 3 Plan.   Exercise goals: For substantial health benefits, adults should do at least 150 minutes (  2 hours and 30 minutes) a week of moderate-intensity, or 75 minutes (1 hour and 15 minutes) a week of vigorous-intensity aerobic physical activity, or an equivalent combination of moderate- and vigorous-intensity aerobic activity. Aerobic activity should be performed in episodes of at least 10 minutes, and preferably, it should be spread throughout the week.  Behavioral modification strategies: celebration eating strategies.  [name] has agreed to follow-up with our clinic in 3 weeks. She was informed of the importance of frequent follow-up visits to maximize her success with  intensive lifestyle modifications for her multiple health conditions.   Objective:   Blood pressure 107/71, pulse 82, temperature (!) 97.4 F (36.3 C), height 5' (1.524 m), weight 159 lb (72.1 kg), SpO2 99 %. Body mass index is 31.05 kg/m.  General: Cooperative, alert, well developed, in no acute distress. HEENT: Conjunctivae and lids unremarkable. Cardiovascular: Regular rhythm.  Lungs: Normal work of breathing. Neurologic: No focal deficits.   Lab Results  Component Value Date   CREATININE 0.87 12/19/2019   BUN 16 12/19/2019   NA 140 12/19/2019   K 3.9 12/19/2019   CL 105 12/19/2019   CO2 28 12/19/2019   Lab Results  Component Value Date   ALT 16 12/19/2019   AST 19 12/19/2019   ALKPHOS 76 01/22/2016   BILITOT 0.3 12/19/2019   Lab Results  Component Value Date   HGBA1C 5.3 12/19/2019   Lab Results  Component Value Date   INSULIN 8.1 06/25/2020   INSULIN 8.5 01/23/2020   Lab Results  Component Value Date   TSH 3.52 12/19/2019   Lab Results  Component Value Date   CHOL 233 (H) 06/25/2020   HDL 73 06/25/2020   LDLCALC 141 (H) 06/25/2020   TRIG 111 06/25/2020   CHOLHDL 3.2 06/25/2020   Lab Results  Component Value Date   WBC 8.8 12/19/2019   HGB 12.8 12/19/2019   HCT 38.5 12/19/2019   MCV 92.3 12/19/2019   PLT 245 12/19/2019   Lab Results  Component Value Date   FERRITIN 31.7 10/20/2014   Attestation Statements:   Reviewed by clinician on day of visit: allergies, medications, problem list, medical history, surgical history, family history, social history, and previous encounter notes.  I, Insurance claims handler, CMA, am acting as Energy manager for [employer], DO.  I have reviewed the above documentation for accuracy and completeness, and I agree with the above. [name], D.O.  The 21st Century Cures Act was signed into law in 2016 which includes the topic of electronic health records.  This provides immediate access to information in  MyChart.  This includes consultation notes, operative notes, office notes, lab results and pathology reports.  If you have any questions about what you read please let us know at your next visit so we can discuss your concerns and take corrective action if need be.  We are right here with you.

## 2020-09-30 ENCOUNTER — Encounter (INDEPENDENT_AMBULATORY_CARE_PROVIDER_SITE_OTHER): Payer: Self-pay | Admitting: Family Medicine

## 2020-09-30 ENCOUNTER — Other Ambulatory Visit: Payer: Self-pay

## 2020-09-30 ENCOUNTER — Other Ambulatory Visit (HOSPITAL_COMMUNITY): Payer: Self-pay

## 2020-09-30 ENCOUNTER — Other Ambulatory Visit: Payer: Self-pay | Admitting: Family Medicine

## 2020-09-30 ENCOUNTER — Ambulatory Visit (INDEPENDENT_AMBULATORY_CARE_PROVIDER_SITE_OTHER): Payer: PRIVATE HEALTH INSURANCE | Admitting: Family Medicine

## 2020-09-30 ENCOUNTER — Other Ambulatory Visit (HOSPITAL_COMMUNITY): Payer: Self-pay | Admitting: Psychiatry

## 2020-09-30 ENCOUNTER — Other Ambulatory Visit (INDEPENDENT_AMBULATORY_CARE_PROVIDER_SITE_OTHER): Payer: Self-pay | Admitting: Family Medicine

## 2020-09-30 VITALS — BP 102/70 | HR 83 | Temp 97.6°F | Ht 60.0 in | Wt 159.0 lb

## 2020-09-30 DIAGNOSIS — F411 Generalized anxiety disorder: Secondary | ICD-10-CM

## 2020-09-30 DIAGNOSIS — E8881 Metabolic syndrome: Secondary | ICD-10-CM

## 2020-09-30 DIAGNOSIS — F39 Unspecified mood [affective] disorder: Secondary | ICD-10-CM

## 2020-09-30 DIAGNOSIS — F331 Major depressive disorder, recurrent, moderate: Secondary | ICD-10-CM

## 2020-09-30 DIAGNOSIS — Z9189 Other specified personal risk factors, not elsewhere classified: Secondary | ICD-10-CM | POA: Diagnosis not present

## 2020-09-30 DIAGNOSIS — E559 Vitamin D deficiency, unspecified: Secondary | ICD-10-CM | POA: Diagnosis not present

## 2020-09-30 DIAGNOSIS — G4700 Insomnia, unspecified: Secondary | ICD-10-CM

## 2020-09-30 DIAGNOSIS — Z6833 Body mass index (BMI) 33.0-33.9, adult: Secondary | ICD-10-CM

## 2020-09-30 DIAGNOSIS — E669 Obesity, unspecified: Secondary | ICD-10-CM

## 2020-09-30 MED ORDER — VITAMIN D (ERGOCALCIFEROL) 1.25 MG (50000 UNIT) PO CAPS: ORAL_CAPSULE | ORAL | 0 refills | Status: DC

## 2020-09-30 MED ORDER — RYBELSUS 14 MG PO TABS: 14.0000 mg | ORAL_TABLET | Freq: Every day | ORAL | 0 refills | Status: DC

## 2020-10-01 ENCOUNTER — Other Ambulatory Visit (HOSPITAL_COMMUNITY): Payer: Self-pay

## 2020-10-01 ENCOUNTER — Other Ambulatory Visit (HOSPITAL_COMMUNITY): Payer: Self-pay | Admitting: *Deleted

## 2020-10-01 ENCOUNTER — Telehealth (INDEPENDENT_AMBULATORY_CARE_PROVIDER_SITE_OTHER): Payer: Self-pay | Admitting: Psychiatry

## 2020-10-01 DIAGNOSIS — F331 Major depressive disorder, recurrent, moderate: Secondary | ICD-10-CM

## 2020-10-01 DIAGNOSIS — G4700 Insomnia, unspecified: Secondary | ICD-10-CM

## 2020-10-01 DIAGNOSIS — F411 Generalized anxiety disorder: Secondary | ICD-10-CM

## 2020-10-01 DIAGNOSIS — F900 Attention-deficit hyperactivity disorder, predominantly inattentive type: Secondary | ICD-10-CM

## 2020-10-01 MED ORDER — AMPHETAMINE-DEXTROAMPHETAMINE 10 MG PO TABS: 10.0000 mg | ORAL_TABLET | Freq: Every day | ORAL | 0 refills | Status: DC

## 2020-10-01 MED ORDER — LORAZEPAM 1 MG PO TABS: ORAL_TABLET | Freq: Every day | ORAL | 1 refills | Status: DC | PRN

## 2020-10-01 MED ORDER — BUSPIRONE HCL 15 MG PO TABS: ORAL_TABLET | Freq: Two times a day (BID) | ORAL | 0 refills | Status: DC

## 2020-10-01 MED ORDER — ARIPIPRAZOLE 5 MG PO TABS: 5.0000 mg | ORAL_TABLET | Freq: Every day | ORAL | 0 refills | Status: DC

## 2020-10-01 MED ORDER — AMPHETAMINE-DEXTROAMPHETAMINE 10 MG PO TABS
10.0000 mg | ORAL_TABLET | Freq: Every day | ORAL | 0 refills | Status: DC
Start: 2020-10-01 — End: 2020-11-19

## 2020-10-01 MED ORDER — BUPROPION HCL ER (XL) 150 MG PO TB24: ORAL_TABLET | Freq: Every day | ORAL | 0 refills | Status: DC

## 2020-10-01 MED ORDER — ARIPIPRAZOLE (SENSOR) 5 MG PO TABS: 5.0000 mg | ORAL_TABLET | Freq: Every day | ORAL | 0 refills | Status: DC

## 2020-10-01 NOTE — Progress Notes (Signed)
Virtual Visit via Telephone Note  I connected with [name] on 10/01/20 at  8:45 AM EDT by telephone and verified that I am speaking with the correct person using two identifiers.  Location: Patient: home Provider: office   I discussed the limitations, risks, security and privacy concerns of performing an evaluation and management service by telephone and the availability of in person appointments. I also discussed with the patient that there may be a patient responsible charge related to this service. The patient expressed understanding and agreed to proceed.   History of Present Illness: [name] notes her depression seems to be improving. She is more energetic and motivated. She is less irritable and her stress tolerance has increased. Out of the last 14 days she only felt depressed 1-2 days. On those days she was less motivated but was able to force herself to do what she needed to do. Her sleep and appetite are good. Her concentration remains poor. The Wellbutrin is not as effective as the Adderall and Concerta. She was diagnosed in high school. She stopped the Adderall because it made her feel feel anxious and irritable at the end of the day. She is struggling at home and at work. She is no longer on Phentermine. This lack of focus is increasing her anxiety. Other than that her anxiety is manageable. She has decreased Ativan use to 3x/week over the last 2 months. Prior she was taking it daily. [name] denies SI/HI.    Observations/Objective:  General Appearance: unable to assess  Eye Contact:  unable to assess  Speech:  Clear and Coherent and Normal Rate  Volume:  Normal  Mood:  Euthymic  Affect:  Full Range  Thought Process:  Goal Directed, Linear, and Descriptions of Associations: Intact  Orientation:  Full (Time, Place, and Person)  Thought Content:  Logical  Suicidal Thoughts:  No  Homicidal Thoughts:  No  Memory:  Immediate;   Good  Judgement:  Good  Insight:  Good   Psychomotor Activity: unable to assess  Concentration:  Concentration: Good  Recall:  Good  Fund of Knowledge:  Good  Language:  Good  Akathisia:  unable to assess  Handed:  Right  AIMS (if indicated):     Assets:  Communication Skills Desire for Improvement Financial Resources/Insurance Housing Physical Health Resilience Social Support Talents/Skills Transportation Vocational/Educational  ADL's:  unable to assess  Cognition:  WNL  Sleep:        Assessment and Plan: Depression screen [facility] 2/9 10/01/2020 01/23/2020 06/20/2014  Decreased Interest 0 2 0  Down, Depressed, Hopeless 1 3 1   PHQ - 2 Score 1 5 1   Altered sleeping - 1 -  Tired, decreased energy - 3 -  Change in appetite - 2 -  Feeling bad or failure about yourself  - 3 -  Trouble concentrating - 3 -  Moving slowly or fidgety/restless - 1 -  Suicidal thoughts - 2 -  PHQ-9 Score - 20 -  Some recent data might be hidden   Flowsheet Row Video Visit from 10/01/2020 in [facility]  C-SSRS RISK CATEGORY No Risk      1. Major depressive disorder, recurrent episode, moderate (HCC) - ARIPiprazole, sensor, 5 MG TABS; Take 5 mg by mouth daily.  Dispense: 90 tablet; Refill: 0 - busPIRone (BUSPAR) 15 MG tablet; TAKE 1 TABLET BY MOUTH 2 TIMES DAILY  Dispense: 180 tablet; Refill: 0 - buPROPion (WELLBUTRIN XL) 150 MG 24 hr tablet; TAKE 2 TABLETS BY  MOUTH ONCE A DAY  Dispense: 180 tablet; Refill: 0  2. GAD (generalized anxiety disorder) - busPIRone (BUSPAR) 15 MG tablet; TAKE 1 TABLET BY MOUTH 2 TIMES DAILY  Dispense: 180 tablet; Refill: 0 - LORazepam (ATIVAN) 1 MG tablet; TAKE 1 TABLET BY MOUTH ONCE A DAY AS NEEDED FOR ANXIETY  Dispense: 30 tablet; Refill: 1  3. Insomnia, unspecified type - busPIRone (BUSPAR) 15 MG tablet; TAKE 1 TABLET BY MOUTH 2 TIMES DAILY  Dispense: 180 tablet; Refill: 0 - LORazepam (ATIVAN) 1 MG tablet; TAKE 1 TABLET BY MOUTH ONCE A DAY AS NEEDED FOR ANXIETY   Dispense: 30 tablet; Refill: 1  4. Attention deficit hyperactivity disorder (ADHD), predominantly inattentive type - start amphetamine-dextroamphetamine (ADDERALL) 10 MG tablet; Take 1 tablet (10 mg total) by mouth daily.  Dispense: 30 tablet; Refill: 0 - amphetamine-dextroamphetamine (ADDERALL) 10 MG tablet; Take 1 tablet (10 mg total) by mouth daily.  Dispense: 30 tablet; Refill: 0  - unable to tolerate higher doses of Wellbutrin due to headaches.   Follow Up Instructions: In 2-3 months or sooner if needed   I discussed the assessment and treatment plan with the patient. The patient was provided an opportunity to ask questions and all were answered. The patient agreed with the plan and demonstrated an understanding of the instructions.   The patient was advised to call back or seek an in-person evaluation if the symptoms worsen or if the condition fails to improve as anticipated.  I provided 12 minutes of non-face-to-face time during this encounter.   Oletta Darter, MD

## 2020-10-07 NOTE — Progress Notes (Signed)
Chief Complaint:   OBESITY [name] is here to discuss her progress with her obesity treatment plan along with follow-up of her obesity related diagnoses.   Today's visit was #: 13 Starting weight: 171 lbs Starting date: 01/23/2020 Today's weight: 159 lbs Today's date: 09/30/2020 Weight change since last visit: 0 Total lbs lost to date: 12 lbs Body mass index is 31.05 kg/m.  Total weight loss percentage to date: -7.02%  Interim History: [name] had Memorial Day and her son's birthday.  Nothing coming up in the next 2-3 weeks for celebrations.  No issues or concerns with plan.  More carb cravings due to eating off plan.  Amenable to increase in Rybelsus.  Plan:  Increase Rybelsus from 7 to 14 mg daily.  Current Meal Plan: the Category 3 Plan for 75% of the time.  Current Exercise Plan: Elliptical for 30 minutes 3 times per week. Current Anti-Obesity Medications: Rybelsus 7 mg daily. Side effects: None.  Assessment/Plan:   Medications Discontinued During This Encounter  Medication Reason   Semaglutide 7 MG TABS Dose change   Vitamin D, Ergocalciferol, (DRISDOL) 1.25 MG (50000 UNIT) CAPS capsule Reorder   Meds ordered this encounter  Medications   Vitamin D, Ergocalciferol, (DRISDOL) 1.25 MG (50000 UNIT) CAPS capsule    Sig: TAKE 1 CAPSULE BY MOUTH EVERY 7 DAYS    Dispense:  4 capsule    Refill:  0   Semaglutide (RYBELSUS) 14 MG TABS    Sig: Take 14 mg by mouth daily.    Dispense:  30 tablet    Refill:  0    Ov for rf   1. Insulin resistance Not at goal. Goal is HgbA1c < 5.7, fasting insulin closer to 5.  Medication: Rybelsus 7 mg daily.    Plan:  Increase Rybelsus to 14 mg daily.  She will continue to focus on protein-rich, low simple carbohydrate foods. We reviewed the importance of hydration, regular exercise for stress reduction, and restorative sleep.    Lab Results  Component Value Date   HGBA1C 5.3 12/19/2019   Lab Results  Component Value Date    INSULIN 8.1 06/25/2020   INSULIN 8.5 01/23/2020   - Increase and refill Semaglutide (RYBELSUS) 14 MG TABS; Take 14 mg by mouth daily.  Dispense: 30 tablet; Refill: 0  2. Vitamin D deficiency At goal. Current vitamin D is 57.7, tested on 06/25/2020. Optimal goal > 50 ng/dL.  She is taking vitamin D 50,000 IU weekly.  Plan: Continue to take prescription Vitamin D @50 ,000 IU every week as prescribed.  Follow-up for routine testing of Vitamin D, at least 2-3 times per year to avoid over-replacement.  - Refill Vitamin D, Ergocalciferol, (DRISDOL) 1.25 MG (50000 UNIT) CAPS capsule; TAKE 1 CAPSULE BY MOUTH EVERY 7 DAYS  Dispense: 4 capsule; Refill: 0  3. Mood disorder (HCC),with emotional eating Controlled. Medication: Wellbutrin XL 300 mg daily.  Stable.  Doing very well.  Sees counselor only once per month now and feels it is adequate.  Plan:  Continue medication per Psychiatry.  Can use EAP at [facility] as needed as well. Continue to increase exercise.  Behavior modification techniques were discussed today to help deal with emotional/non-hunger eating behaviors.  4. At risk for side effect of medication Due to Shonteria's current conditions and medications, she is at a higher risk for drug side effect.  At least 8 minutes was spent on counseling her about these concerns today.  We discussed the benefits and potential risks  of these medications, and all of patient's concerns were addressed and questions were answered.  she will call us, or their PCP or other specialists who treat their conditions with medications, with any questions or concerns that may develop.     5. Obesity, current BMI 31.2  Course: [name] is currently in the action stage of change. As such, her goal is to continue with weight loss efforts.   Nutrition goals: She has agreed to the Category 3 Plan.   Exercise goals: For substantial health benefits, adults should do at least 150 minutes (2 hours and 30 minutes) a week of  moderate-intensity, or 75 minutes (1 hour and 15 minutes) a week of vigorous-intensity aerobic physical activity, or an equivalent combination of moderate- and vigorous-intensity aerobic activity. Aerobic activity should be performed in episodes of at least 10 minutes, and preferably, it should be spread throughout the week.  Behavioral modification strategies: increasing lean protein intake, decreasing simple carbohydrates, increasing water intake, and no skipping meals.  [name] has agreed to follow-up with our clinic in 3 weeks. She was informed of the importance of frequent follow-up visits to maximize her success with intensive lifestyle modifications for her multiple health conditions.   Objective:   Blood pressure 102/70, pulse 83, temperature 97.6 F (36.4 C), height 5' (1.524 m), weight 159 lb (72.1 kg), SpO2 99 %. Body mass index is 31.05 kg/m.  General: Cooperative, alert, well developed, in no acute distress. HEENT: Conjunctivae and lids unremarkable. Cardiovascular: Regular rhythm.  Lungs: Normal work of breathing. Neurologic: No focal deficits.   Lab Results  Component Value Date   CREATININE 0.87 12/19/2019   BUN 16 12/19/2019   NA 140 12/19/2019   K 3.9 12/19/2019   CL 105 12/19/2019   CO2 28 12/19/2019   Lab Results  Component Value Date   ALT 16 12/19/2019   AST 19 12/19/2019   ALKPHOS 76 01/22/2016   BILITOT 0.3 12/19/2019   Lab Results  Component Value Date   HGBA1C 5.3 12/19/2019   Lab Results  Component Value Date   INSULIN 8.1 06/25/2020   INSULIN 8.5 01/23/2020   Lab Results  Component Value Date   TSH 3.52 12/19/2019   Lab Results  Component Value Date   CHOL 233 (H) 06/25/2020   HDL 73 06/25/2020   LDLCALC 141 (H) 06/25/2020   TRIG 111 06/25/2020   CHOLHDL 3.2 06/25/2020   Lab Results  Component Value Date   WBC 8.8 12/19/2019   HGB 12.8 12/19/2019   HCT 38.5 12/19/2019   MCV 92.3 12/19/2019   PLT 245 12/19/2019   Lab Results   Component Value Date   FERRITIN 31.7 10/20/2014   Attestation Statements:   Reviewed by clinician on day of visit: allergies, medications, problem list, medical history, surgical history, family history, social history, and previous encounter notes.  I, Insurance claims handler, CMA, am acting as Energy manager for [employer], DO.  I have reviewed the above documentation for accuracy and completeness, and I agree with the above. [name], D.O.  The 21st Century Cures Act was signed into law in 2016 which includes the topic of electronic health records.  This provides immediate access to information in MyChart.  This includes consultation notes, operative notes, office notes, lab results and pathology reports.  If you have any questions about what you read please let us know at your next visit so we can discuss your concerns and take corrective action if need be.  We  are right here with you.

## 2020-10-22 ENCOUNTER — Other Ambulatory Visit: Payer: Self-pay

## 2020-10-22 ENCOUNTER — Ambulatory Visit (INDEPENDENT_AMBULATORY_CARE_PROVIDER_SITE_OTHER): Payer: PRIVATE HEALTH INSURANCE | Admitting: Family Medicine

## 2020-10-22 ENCOUNTER — Encounter (INDEPENDENT_AMBULATORY_CARE_PROVIDER_SITE_OTHER): Payer: Self-pay | Admitting: Family Medicine

## 2020-10-22 VITALS — BP 100/63 | HR 67 | Temp 98.0°F | Ht 60.0 in | Wt 154.0 lb

## 2020-10-22 DIAGNOSIS — E8881 Metabolic syndrome: Secondary | ICD-10-CM

## 2020-10-22 DIAGNOSIS — Z6833 Body mass index (BMI) 33.0-33.9, adult: Secondary | ICD-10-CM

## 2020-10-22 DIAGNOSIS — E559 Vitamin D deficiency, unspecified: Secondary | ICD-10-CM | POA: Diagnosis not present

## 2020-10-22 DIAGNOSIS — Z9189 Other specified personal risk factors, not elsewhere classified: Secondary | ICD-10-CM

## 2020-10-22 DIAGNOSIS — E669 Obesity, unspecified: Secondary | ICD-10-CM | POA: Diagnosis not present

## 2020-10-22 MED ORDER — RYBELSUS 14 MG PO TABS: 14.0000 mg | ORAL_TABLET | Freq: Every day | ORAL | 0 refills | Status: DC

## 2020-10-22 MED ORDER — VITAMIN D (ERGOCALCIFEROL) 1.25 MG (50000 UNIT) PO CAPS: ORAL_CAPSULE | ORAL | 0 refills | Status: DC

## 2020-11-02 NOTE — Progress Notes (Signed)
Chief Complaint:   OBESITY [name] is here to discuss her progress with her obesity treatment plan along with follow-up of her obesity related diagnoses.   Today's visit was #: 14 Starting weight: 171 lbs Starting date: 01/23/2020 Today's weight: 154 lbs Today's date: 10/22/2020 Weight change since last visit: 5 lbs Total lbs lost to date: 17 lbs Body mass index is 30.08 kg/m.  Total weight loss percentage to date: -9.94%  Interim History:  [name] continues at the gym 3-4 days per week.  She has been eating poorly/off plan about 25% of the time.  Still difficult to eat lunch at work - skips 3 days per week.  Snacks on PB2 on banana, yogurt.  Plan:  CBC and CMP are done at Rheumatology.  She will bring [place] a copy of these labs at next office visit.  Current Meal Plan: the Category 3 Plan for 75% of the time.  Current Exercise Plan: Elliptical for 30 minutes 3-4 times per week. Current Anti-Obesity Medications: Rybelsus 14 mg daily. Side effects: None.  Assessment/Plan:   Orders Placed This Encounter  Procedures   VITAMIN D 25 Hydroxy (Vit-D Deficiency, Fractures)   Medications Discontinued During This Encounter  Medication Reason   Vitamin D, Ergocalciferol, (DRISDOL) 1.25 MG (50000 UNIT) CAPS capsule Reorder   Semaglutide (RYBELSUS) 14 MG TABS Reorder   Meds ordered this encounter  Medications   Semaglutide (RYBELSUS) 14 MG TABS    Sig: Take 14 mg by mouth daily.    Dispense:  30 tablet    Refill:  0    Ov for rf   Vitamin D, Ergocalciferol, (DRISDOL) 1.25 MG (50000 UNIT) CAPS capsule    Sig: TAKE 1 CAPSULE BY MOUTH EVERY 7 DAYS    Dispense:  4 capsule    Refill:  0    30 d supply;  ov for RF   1. Insulin resistance Not at goal. Goal is HgbA1c < 5.7, fasting insulin closer to 5.  Medication: Rybelsus 14 mg daily.    Plan:  She will continue to focus on protein-rich, low simple carbohydrate foods. We reviewed the importance of hydration, regular exercise  for stress reduction, and restorative sleep.   Lab Results  Component Value Date   HGBA1C 5.3 12/19/2019   Lab Results  Component Value Date   INSULIN 8.1 06/25/2020   INSULIN 8.5 01/23/2020   - Refill Semaglutide (RYBELSUS) 14 MG TABS; Take 14 mg by mouth daily.  Dispense: 30 tablet; Refill: 0  2. Vitamin D deficiency At goal.  She is taking vitamin D 50,000 IU weekly.  Plan: Continue to take prescription Vitamin D @50 ,000 IU every week as prescribed.  Will check vitamin D level at next office visit.  Lab Results  Component Value Date   VD25OH 57.7 06/25/2020   VD25OH 38.7 01/23/2020   - Refill Vitamin D, Ergocalciferol, (DRISDOL) 1.25 MG (50000 UNIT) CAPS capsule; TAKE 1 CAPSULE BY MOUTH EVERY 7 DAYS  Dispense: 4 capsule; Refill: 0 - VITAMIN D 25 Hydroxy (Vit-D Deficiency, Fractures)  3. At risk for dehydration Akeila is at higher than average risk of dehydration.  [name] was given more than 9 minutes of proper hydration counseling today.  We discussed the signs and symptoms of dehydration, some of which may include muscle cramping, constipation or even orthostatic symptoms.  Counseling on the prevention of dehydration was also provided today.  [name] is at risk for dehydration due to weight loss, lifestyle and behavorial habits and possibly  due to taking certain medication(s).  She was encouraged to adequately hydrate and monitor fluid status to avoid dehydration as well as weight loss plateaus.  Unless pre-existing renal or cardiopulmonary conditions exist, in which patient was told to limit their fluid intake, I recommended roughly one half of their weight in pounds to be the approximate ounces of non-caloric, non-caffeinated beverages they should drink per day; including more if they are engaging in exercise.  4. Obesity with current BMI 30.1  Course: [name] is currently in the action stage of change. As such, her goal is to continue with weight loss efforts.    Nutrition goals: She has agreed to the Category 3 Plan.   Exercise goals:  As is.  Behavioral modification strategies: increasing lean protein intake, decreasing simple carbohydrates, and no skipping meals.  [name] has agreed to follow-up with our clinic in 3 weeks. She was informed of the importance of frequent follow-up visits to maximize her success with intensive lifestyle modifications for her multiple health conditions.   Objective:   Blood pressure 100/63, pulse 67, temperature 98 F (36.7 C), height 5' (1.524 m), weight 154 lb (69.9 kg), SpO2 97 %. Body mass index is 30.08 kg/m.  General: Cooperative, alert, well developed, in no acute distress. HEENT: Conjunctivae and lids unremarkable. Cardiovascular: Regular rhythm.  Lungs: Normal work of breathing. Neurologic: No focal deficits.   Lab Results  Component Value Date   CREATININE 0.87 12/19/2019   BUN 16 12/19/2019   NA 140 12/19/2019   K 3.9 12/19/2019   CL 105 12/19/2019   CO2 28 12/19/2019   Lab Results  Component Value Date   ALT 16 12/19/2019   AST 19 12/19/2019   ALKPHOS 76 01/22/2016   BILITOT 0.3 12/19/2019   Lab Results  Component Value Date   HGBA1C 5.3 12/19/2019   Lab Results  Component Value Date   INSULIN 8.1 06/25/2020   INSULIN 8.5 01/23/2020   Lab Results  Component Value Date   TSH 3.52 12/19/2019   Lab Results  Component Value Date   CHOL 233 (H) 06/25/2020   HDL 73 06/25/2020   LDLCALC 141 (H) 06/25/2020   TRIG 111 06/25/2020   CHOLHDL 3.2 06/25/2020   Lab Results  Component Value Date   VD25OH 57.7 06/25/2020   VD25OH 38.7 01/23/2020   Lab Results  Component Value Date   WBC 8.8 12/19/2019   HGB 12.8 12/19/2019   HCT 38.5 12/19/2019   MCV 92.3 12/19/2019   PLT 245 12/19/2019   Lab Results  Component Value Date   FERRITIN 31.7 10/20/2014   Attestation Statements:   Reviewed by clinician on day of visit: allergies, medications, problem list, medical  history, surgical history, family history, social history, and previous encounter notes.  I, Insurance claims handler, CMA, am acting as Energy manager for [employer], DO.  I have reviewed the above documentation for accuracy and completeness, and I agree with the above. [name], D.O.  The 21st Century Cures Act was signed into law in 2016 which includes the topic of electronic health records.  This provides immediate access to information in MyChart.  This includes consultation notes, operative notes, office notes, lab results and pathology reports.  If you have any questions about what you read please let us know at your next visit so we can discuss your concerns and take corrective action if need be.  We are right here with you.

## 2020-11-18 ENCOUNTER — Encounter (INDEPENDENT_AMBULATORY_CARE_PROVIDER_SITE_OTHER): Payer: Self-pay | Admitting: Family Medicine

## 2020-11-18 ENCOUNTER — Other Ambulatory Visit: Payer: Self-pay

## 2020-11-18 ENCOUNTER — Ambulatory Visit (INDEPENDENT_AMBULATORY_CARE_PROVIDER_SITE_OTHER): Payer: No Typology Code available for payment source | Admitting: Family Medicine

## 2020-11-18 VITALS — BP 113/74 | HR 69 | Temp 97.6°F | Ht 60.0 in | Wt 149.0 lb

## 2020-11-18 DIAGNOSIS — Z9189 Other specified personal risk factors, not elsewhere classified: Secondary | ICD-10-CM | POA: Diagnosis not present

## 2020-11-18 DIAGNOSIS — Z6833 Body mass index (BMI) 33.0-33.9, adult: Secondary | ICD-10-CM

## 2020-11-18 DIAGNOSIS — E8881 Metabolic syndrome: Secondary | ICD-10-CM | POA: Diagnosis not present

## 2020-11-18 DIAGNOSIS — E559 Vitamin D deficiency, unspecified: Secondary | ICD-10-CM | POA: Diagnosis not present

## 2020-11-18 DIAGNOSIS — E669 Obesity, unspecified: Secondary | ICD-10-CM | POA: Diagnosis not present

## 2020-11-18 MED ORDER — VITAMIN D (ERGOCALCIFEROL) 1.25 MG (50000 UNIT) PO CAPS: ORAL_CAPSULE | ORAL | 0 refills | Status: DC

## 2020-11-18 MED ORDER — RYBELSUS 14 MG PO TABS: 14.0000 mg | ORAL_TABLET | Freq: Every day | ORAL | 0 refills | Status: DC

## 2020-11-19 ENCOUNTER — Telehealth (INDEPENDENT_AMBULATORY_CARE_PROVIDER_SITE_OTHER): Payer: No Typology Code available for payment source | Admitting: Psychiatry

## 2020-11-19 DIAGNOSIS — G4700 Insomnia, unspecified: Secondary | ICD-10-CM | POA: Diagnosis not present

## 2020-11-19 DIAGNOSIS — F411 Generalized anxiety disorder: Secondary | ICD-10-CM | POA: Diagnosis not present

## 2020-11-19 DIAGNOSIS — F900 Attention-deficit hyperactivity disorder, predominantly inattentive type: Secondary | ICD-10-CM

## 2020-11-19 DIAGNOSIS — F331 Major depressive disorder, recurrent, moderate: Secondary | ICD-10-CM | POA: Diagnosis not present

## 2020-11-19 LAB — VITAMIN D 25 HYDROXY (VIT D DEFICIENCY, FRACTURES): Vit D, 25-Hydroxy: 94.9 ng/mL (ref 30.0–100.0)

## 2020-11-19 MED ORDER — BUSPIRONE HCL 15 MG PO TABS
15.0000 mg | ORAL_TABLET | Freq: Three times a day (TID) | ORAL | 0 refills | Status: DC
Start: 2020-11-19 — End: 2021-01-14

## 2020-11-19 MED ORDER — AMPHETAMINE-DEXTROAMPHETAMINE 10 MG PO TABS: 10.0000 mg | ORAL_TABLET | Freq: Every day | ORAL | 0 refills | Status: DC

## 2020-11-19 MED ORDER — ARIPIPRAZOLE 5 MG PO TABS: 5.0000 mg | ORAL_TABLET | Freq: Every day | ORAL | 0 refills | Status: DC

## 2020-11-19 MED ORDER — LORAZEPAM 1 MG PO TABS: ORAL_TABLET | Freq: Every day | ORAL | 1 refills | Status: DC | PRN

## 2020-11-19 MED ORDER — BUPROPION HCL ER (XL) 150 MG PO TB24: ORAL_TABLET | Freq: Every day | ORAL | 0 refills | Status: DC

## 2020-11-19 NOTE — Progress Notes (Signed)
Virtual Visit via Telephone Note  I connected with [name] on 11/19/20 at  9:00 AM EDT by telephone and verified that I am speaking with the correct person using two identifiers. We attempted to do a video session but were unable to hear each other so opted to continue by phone.   Location: Patient: home Provider: office   I discussed the limitations, risks, security and privacy concerns of performing an evaluation and management service by telephone and the availability of in person appointments. I also discussed with the patient that there may be a patient responsible charge related to this service. The patient expressed understanding and agreed to proceed.   History of Present Illness: [name] is suffering from more anxiety on a day to day basis. She has a least 3 hrs of anxiety thru out the day. She has racing thoughts. She worries about things that she can not control such as her kids safety, boyfriend cheating on her, work and day to day activities. [name] is not taking as much Ativan. It is now down to 3-4x/week. She usually takes it when she is more anxious, irritable or having insomnia. Her sleep is not as good on nights before going to work but other nights she sleeps well. Her energy is moderate. She is still working out and exercising on days she is off from work. About 3 days a week she is overwhelmed, unmotivated and fatigued.  She cries on those days sometimes too. She is not sure if it related to her anxiety. Her depression seems stable. She has been feeling depressed about 1 week total over the last 4 weeks. It happens on days off when she has time to think about things. She denies SI/HI. Her ADHD is well controlled with Adderall.    Observations/Objective:  General Appearance: unable to assess  Eye Contact:  unable to assess  Speech:  Clear and Coherent and Normal Rate  Volume:  Normal  Mood:  Anxious  Affect:  Congruent  Thought Process:  Goal Directed, Linear,  and Descriptions of Associations: Intact  Orientation:  Full (Time, Place, and Person)  Thought Content:  Logical  Suicidal Thoughts:  No  Homicidal Thoughts:  No  Memory:  Immediate;   Good  Judgement:  Good  Insight:  Good  Psychomotor Activity: unable to assess  Concentration:  Concentration: Good  Recall:  Good  Fund of Knowledge:  Good  Language:  Good  Akathisia:  unable to assess  Handed:  unable to assess  AIMS (if indicated):     Assets:  Communication Skills Desire for Improvement Financial Resources/Insurance Housing Intimacy Physical Health Resilience Social Support Talents/Skills Transportation Vocational/Educational  ADL's:  unable to assess  Cognition:  WNL  Sleep:        Assessment and Plan: Depression screen [facility] 2/9 11/19/2020 10/01/2020 01/23/2020 06/20/2014  Decreased Interest 1 0 2 0  Down, Depressed, Hopeless 1 1 3 1   PHQ - 2 Score 2 1 5 1   Altered sleeping 1 - 1 -  Tired, decreased energy 1 - 3 -  Change in appetite 0 - 2 -  Feeling bad or failure about yourself  1 - 3 -  Trouble concentrating 0 - 3 -  Moving slowly or fidgety/restless 1 - 1 -  Suicidal thoughts 0 - 2 -  PHQ-9 Score 6 - 20 -  Difficult doing work/chores Somewhat difficult - - -  Some recent data might be hidden    Flowsheet Row Video Visit from  11/19/2020 in [facility] Video Visit from 10/01/2020 in [facility]  C-SSRS RISK CATEGORY No Risk No Risk      - increase Buspar to target anxiety  1. GAD (generalized anxiety disorder) - busPIRone (BUSPAR) 15 MG tablet; Take 1 tablet (15 mg total) by mouth 3 (three) times daily.  Dispense: 270 tablet; Refill: 0 - LORazepam (ATIVAN) 1 MG tablet; TAKE 1 TABLET BY MOUTH ONCE A DAY AS NEEDED FOR ANXIETY  Dispense: 30 tablet; Refill: 1  2. Major depressive disorder, recurrent episode, moderate (HCC) - buPROPion (WELLBUTRIN XL) 150 MG 24 hr tablet; TAKE 2  TABLETS BY MOUTH ONCE A DAY  Dispense: 180 tablet; Refill: 0 - busPIRone (BUSPAR) 15 MG tablet; Take 1 tablet (15 mg total) by mouth 3 (three) times daily.  Dispense: 270 tablet; Refill: 0  3. Attention deficit hyperactivity disorder (ADHD), predominantly inattentive type - amphetamine-dextroamphetamine (ADDERALL) 10 MG tablet; Take 1 tablet (10 mg total) by mouth daily.  Dispense: 30 tablet; Refill: 0 - amphetamine-dextroamphetamine (ADDERALL) 10 MG tablet; Take 1 tablet (10 mg total) by mouth daily.  Dispense: 30 tablet; Refill: 0  4. Insomnia, unspecified type - busPIRone (BUSPAR) 15 MG tablet; Take 1 tablet (15 mg total) by mouth 3 (three) times daily.  Dispense: 270 tablet; Refill: 0 - LORazepam (ATIVAN) 1 MG tablet; TAKE 1 TABLET BY MOUTH ONCE A DAY AS NEEDED FOR ANXIETY  Dispense: 30 tablet; Refill: 1    Follow Up Instructions: In 2 months or sooner if needed   I discussed the assessment and treatment plan with the patient. The patient was provided an opportunity to ask questions and all were answered. The patient agreed with the plan and demonstrated an understanding of the instructions.   The patient was advised to call back or seek an in-person evaluation if the symptoms worsen or if the condition fails to improve as anticipated.  I provided 14 minutes of non-face-to-face time during this encounter.   Oletta Darter, MD

## 2020-11-23 NOTE — Progress Notes (Signed)
Chief Complaint:   OBESITY [name] is here to discuss her progress with her obesity treatment plan along with follow-up of her obesity related diagnoses. [name] is on the Category 3 Plan and states she is following her eating plan approximately 70% of the time. [name] states she is using elliptical 40 minutes 3-4 times per week.  Today's visit was #: 15 Starting weight: 171 lbs Starting date: 01/23/2020 Today's weight: 149 lbs Today's date: 11/18/2020 Total lbs lost to date: 22 Total lbs lost since last in-office visit: 5  Interim History: [name] has been getting more protein in and eating less simple carbs and less off plan eating. She has been consistently exercising as well. She is doing better with water intake.  Subjective:   1. Insulin resistance [name] has a diagnosis of insulin resistance based on her elevated fasting insulin level >5. She continues to work on diet and exercise to decrease her risk of diabetes. She denies constipation or side effects.  Lab Results  Component Value Date   INSULIN 8.1 06/25/2020   INSULIN 8.5 01/23/2020   Lab Results  Component Value Date   HGBA1C 5.3 12/19/2019   2. Vitamin D deficiency She is currently taking prescription vitamin D 50,000 IU each week. She denies nausea, vomiting or muscle weakness.  Lab Results  Component Value Date   VD25OH 94.9 11/18/2020   VD25OH 57.7 06/25/2020   VD25OH 38.7 01/23/2020   3. At risk for deficient intake of food [name] is at risk for deficient intake of food due to increasing exercise.  Assessment/Plan:  No orders of the defined types were placed in this encounter.   Medications Discontinued During This Encounter  Medication Reason   Semaglutide (RYBELSUS) 14 MG TABS Reorder   Vitamin D, Ergocalciferol, (DRISDOL) 1.25 MG (50000 UNIT) CAPS capsule Reorder     Meds ordered this encounter  Medications   Semaglutide (RYBELSUS) 14 MG TABS    Sig: Take 14 mg by mouth  daily.    Dispense:  30 tablet    Refill:  0    Ov for rf   DISCONTD: Vitamin D, Ergocalciferol, (DRISDOL) 1.25 MG (50000 UNIT) CAPS capsule    Sig: TAKE 1 CAPSULE BY MOUTH EVERY 7 DAYS    Dispense:  4 capsule    Refill:  0    30 d supply;  ov for RF     1. Insulin resistance [name] will continue to work on weight loss, exercise, and decreasing simple carbohydrates to help decrease the risk of diabetes. [name] agreed to follow-up with [place] as directed to closely monitor her progress.  Refill- Semaglutide (RYBELSUS) 14 MG TABS; Take 14 mg by mouth daily.  Dispense: 30 tablet; Refill: 0  2. Vitamin D deficiency Low Vitamin D level contributes to fatigue and are associated with obesity, breast, and colon cancer. She agrees to continue to take prescription Vitamin D @50 ,000 IU every week and will follow-up for routine testing of Vitamin D, at least 2-3 times per year to avoid over-replacement.  Refill- Vitamin D, Ergocalciferol, (DRISDOL) 1.25 MG (50000 UNIT) CAPS capsule; TAKE 1 CAPSULE BY MOUTH EVERY 7 DAYS  Dispense: 4 capsule; Refill: 0  3. At risk for deficient intake of food [name] was given approximately 8 minutes of deficit intake of food prevention counseling today. [name] is at risk for eating too few calories based on current food recall. She was encouraged to focus on meeting caloric and protein goals according to her recommended meal plan.  4. Obesity with current BMI of 29.1  [name] is currently in the action stage of change. As such, her goal is to continue with weight loss efforts. She has agreed to the Category 3 Plan.   Exercise goals:  As is  Behavioral modification strategies: increasing water intake, avoiding temptations, and planning for success.  [name] has agreed to follow-up with our clinic in 3 weeks. She was informed of the importance of frequent follow-up visits to maximize her success with intensive lifestyle modifications for her multiple  health conditions.   Objective:   Blood pressure 113/74, pulse 69, temperature 97.6 F (36.4 C), height 5' (1.524 m), weight 149 lb (67.6 kg), SpO2 96 %. Body mass index is 29.1 kg/m.  General: Cooperative, alert, well developed, in no acute distress. HEENT: Conjunctivae and lids unremarkable. Cardiovascular: Regular rhythm.  Lungs: Normal work of breathing. Neurologic: No focal deficits.   Lab Results  Component Value Date   CREATININE 0.87 12/19/2019   BUN 16 12/19/2019   NA 140 12/19/2019   K 3.9 12/19/2019   CL 105 12/19/2019   CO2 28 12/19/2019   Lab Results  Component Value Date   ALT 16 12/19/2019   AST 19 12/19/2019   ALKPHOS 76 01/22/2016   BILITOT 0.3 12/19/2019   Lab Results  Component Value Date   HGBA1C 5.3 12/19/2019   Lab Results  Component Value Date   INSULIN 8.1 06/25/2020   INSULIN 8.5 01/23/2020   Lab Results  Component Value Date   TSH 3.52 12/19/2019   Lab Results  Component Value Date   CHOL 233 (H) 06/25/2020   HDL 73 06/25/2020   LDLCALC 141 (H) 06/25/2020   TRIG 111 06/25/2020   CHOLHDL 3.2 06/25/2020   Lab Results  Component Value Date   VD25OH 94.9 11/18/2020   VD25OH 57.7 06/25/2020   VD25OH 38.7 01/23/2020   Lab Results  Component Value Date   WBC 8.8 12/19/2019   HGB 12.8 12/19/2019   HCT 38.5 12/19/2019   MCV 92.3 12/19/2019   PLT 245 12/19/2019   Lab Results  Component Value Date   FERRITIN 31.7 10/20/2014   Attestation Statements:   Reviewed by clinician on day of visit: allergies, medications, problem list, medical history, surgical history, family history, social history, and previous encounter notes.  Edmund Hilda, CMA, am acting as transcriptionist for [employer], DO.  I have reviewed the above documentation for accuracy and completeness, and I agree with the above. [name], D.O.  The 21st Century Cures Act was signed into law in 2016 which includes the topic of electronic  health records.  This provides immediate access to information in MyChart.  This includes consultation notes, operative notes, office notes, lab results and pathology reports.  If you have any questions about what you read please let us know at your next visit so we can discuss your concerns and take corrective action if need be.  We are right here with you.

## 2020-11-27 ENCOUNTER — Other Ambulatory Visit (INDEPENDENT_AMBULATORY_CARE_PROVIDER_SITE_OTHER): Payer: Self-pay | Admitting: Family Medicine

## 2020-11-27 DIAGNOSIS — E559 Vitamin D deficiency, unspecified: Secondary | ICD-10-CM

## 2020-11-27 MED ORDER — VITAMIN D (ERGOCALCIFEROL) 1.25 MG (50000 UNIT) PO CAPS: ORAL_CAPSULE | ORAL | 0 refills | Status: DC

## 2020-11-27 NOTE — Progress Notes (Signed)
Based on recent labs, we will dec. ergo dose

## 2020-12-08 ENCOUNTER — Ambulatory Visit (INDEPENDENT_AMBULATORY_CARE_PROVIDER_SITE_OTHER): Payer: No Typology Code available for payment source | Admitting: Family Medicine

## 2020-12-08 ENCOUNTER — Encounter (INDEPENDENT_AMBULATORY_CARE_PROVIDER_SITE_OTHER): Payer: Self-pay | Admitting: Family Medicine

## 2020-12-08 ENCOUNTER — Other Ambulatory Visit: Payer: Self-pay

## 2020-12-08 VITALS — BP 110/75 | HR 75 | Temp 97.6°F | Ht 60.0 in | Wt 144.0 lb

## 2020-12-08 DIAGNOSIS — E559 Vitamin D deficiency, unspecified: Secondary | ICD-10-CM

## 2020-12-08 DIAGNOSIS — Z9189 Other specified personal risk factors, not elsewhere classified: Secondary | ICD-10-CM

## 2020-12-08 DIAGNOSIS — Z6833 Body mass index (BMI) 33.0-33.9, adult: Secondary | ICD-10-CM | POA: Diagnosis not present

## 2020-12-08 DIAGNOSIS — E669 Obesity, unspecified: Secondary | ICD-10-CM | POA: Diagnosis not present

## 2020-12-08 DIAGNOSIS — E8881 Metabolic syndrome: Secondary | ICD-10-CM

## 2020-12-08 MED ORDER — VITAMIN D (ERGOCALCIFEROL) 1.25 MG (50000 UNIT) PO CAPS: ORAL_CAPSULE | ORAL | 0 refills | Status: DC

## 2020-12-08 MED ORDER — RYBELSUS 14 MG PO TABS: 14.0000 mg | ORAL_TABLET | Freq: Every day | ORAL | 0 refills | Status: DC

## 2020-12-09 NOTE — Progress Notes (Signed)
Chief Complaint:   OBESITY [name] is here to discuss her progress with her obesity treatment plan along with follow-up of her obesity related diagnoses. [name] is on the Category 3 Plan and states she is following her eating plan approximately 60% of the time. [name] states she is on the elliptical for 40 minutes 3-4 times per week.  Today's visit was #: 16 Starting weight: 171 lbs Starting date: 01/23/2020 Today's weight: 144 lbs Today's date: 12/08/2020 Total lbs lost to date: 27 Total lbs lost since last in-office visit: 6  Interim History: [name] is here for a follow up office visit.  We reviewed her meal plan and questions were answered.  Patient's food recall appears to be accurate and consistent with what is on plan when she is following it.   When eating on plan, her hunger and cravings are well controlled.  She stuck to the plan more whenever she was eating on the plan. She is avoiding simple carbohydrates more. She increased exercise by being more active in general in her day to day activities.  Subjective:   1. Insulin resistance [name] has a diagnosis of insulin resistance based on her elevated fasting insulin level >5. She continues to work on diet and exercise to decrease her risk of diabetes.  2. Vitamin D deficiency [name] is currently taking prescription vitamin D 50,000 IU each week now. Last Vit D level was 94.9, which is above goal. She denies nausea, vomiting or muscle weakness. I discussed labs with the patient today.  3. At risk for deficient intake of food [name] is at a higher than average risk of deficient intake of food due to increasing activity/exercise.  Assessment/Plan:  No orders of the defined types were placed in this encounter.   Medications Discontinued During This Encounter  Medication Reason   Semaglutide (RYBELSUS) 14 MG TABS Reorder   Vitamin D, Ergocalciferol, (DRISDOL) 1.25 MG (50000 UNIT) CAPS capsule  Reorder     Meds ordered this encounter  Medications   DISCONTD: Semaglutide (RYBELSUS) 14 MG TABS    Sig: Take 14 mg by mouth daily.    Dispense:  30 tablet    Refill:  0    Ov for rf   DISCONTD: Vitamin D, Ergocalciferol, (DRISDOL) 1.25 MG (50000 UNIT) CAPS capsule    Sig: 1 po q 10 days    Dispense:  3 capsule    Refill:  0    30 d supply;  ov for RF     1. Insulin resistance [name] will continue to work on weight loss, exercise, and decreasing simple carbohydrates to help decrease the risk of diabetes. We will refill Rybelsus for 1 month. [name] agreed to follow-up with [place] as directed to closely monitor her progress.  - Semaglutide (RYBELSUS) 14 MG TABS; Take 14 mg by mouth daily.  Dispense: 30 tablet; Refill: 0  2. Vitamin D deficiency Low Vitamin D level contributes to fatigue and are associated with obesity, breast, and colon cancer. [name] agreed to decrease prescription Vitamin D to 50,000 IU every 10 days with no refills. We will recheck labs in 2-3 months, and will continue to monitor closely. She will follow-up for routine testing of Vitamin D, at least 2-3 times per year to avoid over-replacement.  - Vitamin D, Ergocalciferol, (DRISDOL) 1.25 MG (50000 UNIT) CAPS capsule; 1 po q 10 days  Dispense: 3 capsule; Refill: 0  3. At risk for deficient intake of food [name] was given approximately 9  minutes of deficit intake of food prevention counseling today. [name] is at risk for eating too few calories based on current food recall. She was encouraged to focus on meeting caloric and protein goals according to her recommended meal plan.   4. Obesity with current BMI of 28.3 [name] is currently in the action stage of change. As such, her goal is to continue with weight loss efforts. She has agreed to the Category 3 Plan.   Exercise goals: As is.  Behavioral modification strategies: increasing lean protein intake, with increased exercise/activity and no  skipping meals.  [name] has agreed to follow-up with our clinic in 3 weeks. She was informed of the importance of frequent follow-up visits to maximize her success with intensive lifestyle modifications for her multiple health conditions.   Objective:   Blood pressure 110/75, pulse 75, temperature 97.6 F (36.4 C), height 5' (1.524 m), weight 144 lb (65.3 kg), SpO2 99 %. Body mass index is 28.12 kg/m.  General: Cooperative, alert, well developed, in no acute distress. HEENT: Conjunctivae and lids unremarkable. Cardiovascular: Regular rhythm.  Lungs: Normal work of breathing. Neurologic: No focal deficits.   Lab Results  Component Value Date   CREATININE 0.87 12/19/2019   BUN 16 12/19/2019   NA 140 12/19/2019   K 3.9 12/19/2019   CL 105 12/19/2019   CO2 28 12/19/2019   Lab Results  Component Value Date   ALT 16 12/19/2019   AST 19 12/19/2019   ALKPHOS 76 01/22/2016   BILITOT 0.3 12/19/2019   Lab Results  Component Value Date   HGBA1C 5.3 12/19/2019   Lab Results  Component Value Date   INSULIN 8.1 06/25/2020   INSULIN 8.5 01/23/2020   Lab Results  Component Value Date   TSH 3.52 12/19/2019   Lab Results  Component Value Date   CHOL 233 (H) 06/25/2020   HDL 73 06/25/2020   LDLCALC 141 (H) 06/25/2020   TRIG 111 06/25/2020   CHOLHDL 3.2 06/25/2020   Lab Results  Component Value Date   VD25OH 94.9 11/18/2020   VD25OH 57.7 06/25/2020   VD25OH 38.7 01/23/2020   Lab Results  Component Value Date   WBC 8.8 12/19/2019   HGB 12.8 12/19/2019   HCT 38.5 12/19/2019   MCV 92.3 12/19/2019   PLT 245 12/19/2019   Lab Results  Component Value Date   FERRITIN 31.7 10/20/2014   Attestation Statements:   Reviewed by clinician on day of visit: allergies, medications, problem list, medical history, surgical history, family history, social history, and previous encounter notes.   [name], am acting as transcriptionist for [employer], DO.  I have  reviewed the above documentation for accuracy and completeness, and I agree with the above. [name], D.O.  The 21st Century Cures Act was signed into law in 2016 which includes the topic of electronic health records.  This provides immediate access to information in MyChart.  This includes consultation notes, operative notes, office notes, lab results and pathology reports.  If you have any questions about what you read please let us know at your next visit so we can discuss your concerns and take corrective action if need be.  We are right here with you.

## 2020-12-31 ENCOUNTER — Encounter (INDEPENDENT_AMBULATORY_CARE_PROVIDER_SITE_OTHER): Payer: Self-pay | Admitting: Family Medicine

## 2020-12-31 ENCOUNTER — Ambulatory Visit (INDEPENDENT_AMBULATORY_CARE_PROVIDER_SITE_OTHER): Payer: No Typology Code available for payment source | Admitting: Family Medicine

## 2020-12-31 ENCOUNTER — Other Ambulatory Visit: Payer: Self-pay

## 2020-12-31 VITALS — BP 108/72 | HR 82 | Temp 97.9°F | Ht 60.0 in | Wt 141.0 lb

## 2020-12-31 DIAGNOSIS — E8881 Metabolic syndrome: Secondary | ICD-10-CM | POA: Diagnosis not present

## 2020-12-31 DIAGNOSIS — E669 Obesity, unspecified: Secondary | ICD-10-CM | POA: Diagnosis not present

## 2020-12-31 DIAGNOSIS — E559 Vitamin D deficiency, unspecified: Secondary | ICD-10-CM | POA: Diagnosis not present

## 2020-12-31 DIAGNOSIS — Z9189 Other specified personal risk factors, not elsewhere classified: Secondary | ICD-10-CM | POA: Diagnosis not present

## 2020-12-31 DIAGNOSIS — Z6833 Body mass index (BMI) 33.0-33.9, adult: Secondary | ICD-10-CM

## 2020-12-31 MED ORDER — RYBELSUS 14 MG PO TABS: 14.0000 mg | ORAL_TABLET | Freq: Every day | ORAL | 0 refills | Status: DC

## 2020-12-31 MED ORDER — VITAMIN D3 50 MCG (2000 UT) PO CAPS: 2000.0000 [IU] | ORAL_CAPSULE | Freq: Every day | ORAL | 0 refills | Status: DC

## 2020-12-31 NOTE — Progress Notes (Signed)
Chief Complaint:   OBESITY [name] is here to discuss her progress with her obesity treatment plan along with follow-up of her obesity related diagnoses. [name] is on the Category 3 Plan and states she is following her eating plan approximately 75% of the time. [name] states she is using the elliptical 40 minutes 3 times per week.  Today's visit was #: 17 Starting weight: 171 lbs Starting date: 01/23/2020 Today's weight: 141 lbs Today's date: 12/31/2020 Total lbs lost to date: 30 Total lbs lost since last in-office visit: 3  Interim History: [name] returns to the clinic with good compliance to category 3. She like the flexibility of category 3 and likes snack options. She has no plans for the upcoming few weeks. Her biggest obstacle is celebratory eating or eating out.  Subjective:   1. Vitamin D deficiency Pt denies nausea, vomiting, and muscle weakness but notes fatigue. Her last Vit D level was 94.  2. Insulin resistance [name] is on Rybelsus 14 mg daily and denies GI side effects.  3. At risk for side effect of medication [name] is at risk for side effects of medication due to Rybelsus 14 mg.  Assessment/Plan:   1. Vitamin D deficiency Low Vitamin D level contributes to fatigue and are associated with obesity, breast, and colon cancer. She agrees to discontinue prescription Vitamin D 50,000 IU and start OTC Vit D 2,000 IU daily. She will follow-up for routine testing of Vitamin D, at least 2-3 times per year to avoid over-replacement.  Change to OTC- Cholecalciferol (VITAMIN D3) 50 MCG (2000 UT) capsule; Take 1 capsule (2,000 Units total) by mouth daily.  Dispense: 30 capsule; Refill: 0  2. Insulin resistance [name] will continue to work on weight loss, exercise, and decreasing simple carbohydrates to help decrease the risk of diabetes. [name] agreed to follow-up with [place] as directed to closely monitor her progress.  Refill- Semaglutide (RYBELSUS) 14  MG TABS; Take 14 mg by mouth daily.  Dispense: 30 tablet; Refill: 0  3. At risk for side effect of medication [name] was given approximately 15 minutes of drug side effect counseling today.  We discussed side effect possibility and risk versus benefits. [name] agreed to the medication and will contact this office if these side effects are intolerable.  Repetitive spaced learning was employed today to elicit superior memory formation and behavioral change.   4. Obesity with current BMI of 27.5  [name] is currently in the action stage of change. As such, her goal is to continue with weight loss efforts. She has agreed to the Category 3 Plan.   Exercise goals: All adults should avoid inactivity. Some physical activity is better than none, and adults who participate in any amount of physical activity gain some health benefits. Add in resistance training 10-15 minutes 2-3 times a week.  Behavioral modification strategies: increasing lean protein intake, meal planning and cooking strategies, keeping healthy foods in the home, and planning for success.  [name] has agreed to follow-up with our clinic in 3 weeks. She was informed of the importance of frequent follow-up visits to maximize her success with intensive lifestyle modifications for her multiple health conditions.   Objective:   Blood pressure 108/72, pulse 82, temperature 97.9 F (36.6 C), height 5' (1.524 m), weight 141 lb (64 kg), SpO2 100 %. Body mass index is 27.54 kg/m.  General: Cooperative, alert, well developed, in no acute distress. HEENT: Conjunctivae and lids unremarkable. Cardiovascular: Regular rhythm.  Lungs: Normal work of breathing.  Neurologic: No focal deficits.   Lab Results  Component Value Date   CREATININE 0.87 12/19/2019   BUN 16 12/19/2019   NA 140 12/19/2019   K 3.9 12/19/2019   CL 105 12/19/2019   CO2 28 12/19/2019   Lab Results  Component Value Date   ALT 16 12/19/2019   AST 19  12/19/2019   ALKPHOS 76 01/22/2016   BILITOT 0.3 12/19/2019   Lab Results  Component Value Date   HGBA1C 5.3 12/19/2019   Lab Results  Component Value Date   INSULIN 8.1 06/25/2020   INSULIN 8.5 01/23/2020   Lab Results  Component Value Date   TSH 3.52 12/19/2019   Lab Results  Component Value Date   CHOL 233 (H) 06/25/2020   HDL 73 06/25/2020   LDLCALC 141 (H) 06/25/2020   TRIG 111 06/25/2020   CHOLHDL 3.2 06/25/2020   Lab Results  Component Value Date   VD25OH 94.9 11/18/2020   VD25OH 57.7 06/25/2020   VD25OH 38.7 01/23/2020   Lab Results  Component Value Date   WBC 8.8 12/19/2019   HGB 12.8 12/19/2019   HCT 38.5 12/19/2019   MCV 92.3 12/19/2019   PLT 245 12/19/2019   Lab Results  Component Value Date   FERRITIN 31.7 10/20/2014    Attestation Statements:   Reviewed by clinician on day of visit: allergies, medications, problem list, medical history, surgical history, family history, social history, and previous encounter notes.  Edmund Hilda, CMA, am acting as transcriptionist for Reuben Likes, MD.   I have reviewed the above documentation for accuracy and completeness, and I agree with the above. - Reuben Likes, MD

## 2021-01-14 ENCOUNTER — Telehealth (INDEPENDENT_AMBULATORY_CARE_PROVIDER_SITE_OTHER): Payer: No Typology Code available for payment source | Admitting: Psychiatry

## 2021-01-14 ENCOUNTER — Other Ambulatory Visit: Payer: Self-pay

## 2021-01-14 DIAGNOSIS — F900 Attention-deficit hyperactivity disorder, predominantly inattentive type: Secondary | ICD-10-CM | POA: Diagnosis not present

## 2021-01-14 DIAGNOSIS — G4700 Insomnia, unspecified: Secondary | ICD-10-CM | POA: Diagnosis not present

## 2021-01-14 DIAGNOSIS — F411 Generalized anxiety disorder: Secondary | ICD-10-CM

## 2021-01-14 DIAGNOSIS — F331 Major depressive disorder, recurrent, moderate: Secondary | ICD-10-CM | POA: Diagnosis not present

## 2021-01-14 MED ORDER — BUSPIRONE HCL 30 MG PO TABS: 30.0000 mg | ORAL_TABLET | Freq: Two times a day (BID) | ORAL | 0 refills | Status: DC

## 2021-01-14 MED ORDER — AMPHETAMINE-DEXTROAMPHETAMINE 10 MG PO TABS: 10.0000 mg | ORAL_TABLET | Freq: Every day | ORAL | 0 refills | Status: DC

## 2021-01-14 MED ORDER — ARIPIPRAZOLE 10 MG PO TABS: 10.0000 mg | ORAL_TABLET | Freq: Every day | ORAL | 0 refills | Status: DC

## 2021-01-14 MED ORDER — BUPROPION HCL ER (XL) 150 MG PO TB24: ORAL_TABLET | Freq: Every day | ORAL | 0 refills | Status: DC

## 2021-01-14 MED ORDER — LORAZEPAM 1 MG PO TABS: ORAL_TABLET | Freq: Every day | ORAL | 1 refills | Status: DC | PRN

## 2021-01-14 NOTE — Progress Notes (Signed)
Virtual Visit via Video Note  I connected with [name] on 01/14/21 at  2:00 PM EDT by a video enabled telemedicine application and verified that I am speaking with the correct person using two identifiers.  Location: Patient: home Provider: office   I discussed the limitations of evaluation and management by telemedicine and the availability of in person appointments. The patient expressed understanding and agreed to proceed.  History of Present Illness: [name] shares that nothing has really changed since our last visit. Her anxiety remains high and she doesn't know what to do to. She has tried books, therapy, exercising, hot showers, meditation. It sometimes works but most of the time not. Her sleep and appetite are good. Her energy is a little down. She has anxiety on a daily basis and she takes Ativan daily. It sometimes works and sometimes doesn't. The depression is tied to her anxiety. She denies negative self talk and hopelessness. She denies SI/HI. The Adderall remains effective and her focus is good. The Abilify caused increased appetite initially but that has now resolved. She often misses the afternoon dose of Buspar and would prefer to take it twice a day.    Observations/Objective: Psychiatric Specialty Exam: ROS  There were no vitals taken for this visit.There is no height or weight on file to calculate BMI.  General Appearance: Casual and Neat  Eye Contact:  Fair  Speech:  Clear and Coherent and Normal Rate  Volume:  Normal  Mood:  Depressed  Affect:  Congruent and Tearful  Thought Process:  Goal Directed, Linear, and Descriptions of Associations: Intact  Orientation:  Full (Time, Place, and Person)  Thought Content:  Logical  Suicidal Thoughts:  No  Homicidal Thoughts:  No  Memory:  Immediate;   Good  Judgement:  Good  Insight:  Good  Psychomotor Activity:  Normal  Concentration:  Concentration: Good  Recall:  Good  Fund of Knowledge:  Good  Language:   Good  Akathisia:  No  Handed:  Right  AIMS (if indicated):     Assets:  Communication Skills Desire for Improvement Financial Resources/Insurance Housing Intimacy Resilience Social Support Talents/Skills Transportation Vocational/Educational  ADL's:  Intact  Cognition:  WNL  Sleep:        Assessment and Plan:  - increase Abilify 10mg  po qD to target mood and anxiety  - increase Buspar to 30mg  BID  - therapy is down to 1x/month due to cost  1. Attention deficit hyperactivity disorder (ADHD), predominantly inattentive type - amphetamine-dextroamphetamine (ADDERALL) 10 MG tablet; Take 1 tablet (10 mg total) by mouth daily.  Dispense: 30 tablet; Refill: 0 - amphetamine-dextroamphetamine (ADDERALL) 10 MG tablet; Take 1 tablet (10 mg total) by mouth daily.  Dispense: 30 tablet; Refill: 0  2. Major depressive disorder, recurrent episode, moderate (HCC) - buPROPion (WELLBUTRIN XL) 150 MG 24 hr tablet; TAKE 2 TABLETS BY MOUTH ONCE A DAY  Dispense: 180 tablet; Refill: 0 - busPIRone (BUSPAR) 30 MG tablet; Take 1 tablet (30 mg total) by mouth 2 (two) times daily.  Dispense: 180 tablet; Refill: 0  3. GAD (generalized anxiety disorder) - busPIRone (BUSPAR) 30 MG tablet; Take 1 tablet (30 mg total) by mouth 2 (two) times daily.  Dispense: 180 tablet; Refill: 0 - LORazepam (ATIVAN) 1 MG tablet; TAKE 1 TABLET BY MOUTH ONCE A DAY AS NEEDED FOR ANXIETY  Dispense: 30 tablet; Refill: 1  4. Insomnia, unspecified type - busPIRone (BUSPAR) 30 MG tablet; Take 1 tablet (30 mg total) by mouth  2 (two) times daily.  Dispense: 180 tablet; Refill: 0 - LORazepam (ATIVAN) 1 MG tablet; TAKE 1 TABLET BY MOUTH ONCE A DAY AS NEEDED FOR ANXIETY  Dispense: 30 tablet; Refill: 1   Follow Up Instructions: In 1-2 months or sooner if needed   I discussed the assessment and treatment plan with the patient. The patient was provided an opportunity to ask questions and all were answered. The patient agreed with the  plan and demonstrated an understanding of the instructions.   The patient was advised to call back or seek an in-person evaluation if the symptoms worsen or if the condition fails to improve as anticipated.  I provided 14 minutes of non-face-to-face time during this encounter.   Oletta Darter, MD

## 2021-01-19 ENCOUNTER — Ambulatory Visit (INDEPENDENT_AMBULATORY_CARE_PROVIDER_SITE_OTHER): Payer: No Typology Code available for payment source | Admitting: Family Medicine

## 2021-01-19 ENCOUNTER — Other Ambulatory Visit: Payer: Self-pay

## 2021-01-19 ENCOUNTER — Encounter (INDEPENDENT_AMBULATORY_CARE_PROVIDER_SITE_OTHER): Payer: Self-pay | Admitting: Family Medicine

## 2021-01-19 VITALS — BP 88/56 | HR 68 | Temp 97.8°F | Ht 60.0 in | Wt 137.0 lb

## 2021-01-19 DIAGNOSIS — Z6833 Body mass index (BMI) 33.0-33.9, adult: Secondary | ICD-10-CM

## 2021-01-19 DIAGNOSIS — E669 Obesity, unspecified: Secondary | ICD-10-CM | POA: Diagnosis not present

## 2021-01-19 DIAGNOSIS — Z9189 Other specified personal risk factors, not elsewhere classified: Secondary | ICD-10-CM

## 2021-01-19 DIAGNOSIS — E559 Vitamin D deficiency, unspecified: Secondary | ICD-10-CM

## 2021-01-19 DIAGNOSIS — E7849 Other hyperlipidemia: Secondary | ICD-10-CM

## 2021-01-19 DIAGNOSIS — E8881 Metabolic syndrome: Secondary | ICD-10-CM | POA: Diagnosis not present

## 2021-01-19 MED ORDER — RYBELSUS 14 MG PO TABS: 14.0000 mg | ORAL_TABLET | Freq: Every day | ORAL | 0 refills | Status: DC

## 2021-01-19 MED ORDER — VITAMIN D (ERGOCALCIFEROL) 1.25 MG (50000 UNIT) PO CAPS: ORAL_CAPSULE | ORAL | 0 refills | Status: DC

## 2021-01-20 NOTE — Progress Notes (Signed)
Chief Complaint:   OBESITY [name] is here to discuss her progress with her obesity treatment plan along with follow-up of her obesity related diagnoses. [name] is on the Category 3 Plan and states she is following her eating plan approximately 75% of the time. [name] states she is using the elliptical for 40 minutes 3-4 times per week.  Today's visit was #: 18 Starting weight: 171 lbs Starting date: 01/23/2020 Today's weight: 137 lbs Today's date: 01/19/2021 Total lbs lost to date: 34 lbs Total lbs lost since last in-office visit: 4 lbs  Interim History: [name] is using the elliptical at the gym. She is currently not using weights yet. She has decreased her metabolic age from 27 at start of program to now 71 and her current visceral fat rating is at 4. She has increased at night some but manageable.   Subjective:   1. Other hyperlipidemia Medication(s) reviewed. Patient denies myalgias. [name]'s diet is controlled with no medications.  2. Vitamin D deficiency She is currently taking prescription vitamin D 2,000 IU each week. She denies nausea, vomiting or muscle weakness.  3. Insulin resistance [name] has a diagnosis of insulin resistance based on her elevated fasting insulin level >5. She continues to work on diet and exercise to decrease her risk of diabetes.  4. At risk for malnutrition [name] is at risk for malnutrition due to exercise, may need to increase nutritional intake.   Assessment/Plan:   Orders Placed This Encounter  Procedures   VITAMIN D 25 Hydroxy (Vit-D Deficiency, Fractures)   Insulin, random   Lipid Panel With LDL/HDL Ratio    Medications Discontinued During This Encounter  Medication Reason   Cholecalciferol (VITAMIN D3) 50 MCG (2000 UT) capsule Entry Error   Semaglutide (RYBELSUS) 14 MG TABS Reorder     Meds ordered this encounter  Medications   Semaglutide (RYBELSUS) 14 MG TABS    Sig: Take 14 mg by mouth daily.     Dispense:  30 tablet    Refill:  0    30 d supply;  ** OV for RF **   Do not send RF request   Vitamin D, Ergocalciferol, (DRISDOL) 1.25 MG (50000 UNIT) CAPS capsule    Sig: 1 po q 10 days    Dispense:  3 capsule    Refill:  0    30 d supply;  ov for RF     1. Other hyperlipidemia Cardiovascular risk and specific lipid/LDL goals reviewed.  We discussed several lifestyle modifications today and Pearlene will continue to work on diet, exercise and weight loss efforts. We will check FLP at her next office visit. Orders and follow up as documented in patient record.   Counseling Intensive lifestyle modifications are the first line treatment for this issue. Dietary changes: Increase soluble fiber. Decrease simple carbohydrates. Exercise changes: Moderate to vigorous-intensity aerobic activity 150 minutes per week if tolerated. Lipid-lowering medications: see documented in medical record.  - Lipid Panel With LDL/HDL Ratio  2. Vitamin D deficiency Low Vitamin D level contributes to fatigue and are associated with obesity, breast, and colon cancer. We will refill prescription Vitamin D 2 ,000 IU every 10 days and Exa will follow-up for routine testing of Vitamin D, at least 2-3 times per year to avoid over-replacement. We will recheck Vitamin at her next office visit.   - Vitamin D, Ergocalciferol, (DRISDOL) 1.25 MG (50000 UNIT) CAPS capsule; 1 po q 10 days  Dispense: 3 capsule; Refill: 0 - VITAMIN D  25 Hydroxy (Vit-D Deficiency, Fractures)  3. Insulin resistance Harshika will continue to work on weight loss, exercise, and decreasing simple carbohydrates to help decrease the risk of diabetes. Zephyr agreed to follow-up with [place] as directed to closely monitor her progress. We will refill Rybelsus 14 mg for 1 month. We will recheck fasting Insulin at her next office visit.  - Semaglutide (RYBELSUS) 14 MG TABS; Take 14 mg by mouth daily.  Dispense: 30 tablet; Refill: 0 - Insulin,  random  4. At risk for malnutrition [name] was given approximately 15 minutes of counseling today regarding prevention of malnutrition and ways to meet macronutrient goals..    5. Obesity with current BMI of 26.8 [name] is currently in the action stage of change. As such, her goal is to continue with weight loss efforts. She has agreed to the Category 3 Plan plus add 100-150 calories to dinner.  [name] will add 100-150 calories at night of higher fiber and higher protein food. We will review IC at next office visit.   Exercise goals:  As is.  Behavioral modification strategies: increasing water intake, keeping healthy foods in the home, and better snacking choices.  [name] has agreed to follow-up with our clinic in 3 weeks. She was informed of the importance of frequent follow-up visits to maximize her success with intensive lifestyle modifications for her multiple health conditions.   [name] was informed we would discuss her lab results at her next visit unless there is a critical issue that needs to be addressed sooner. [name] agreed to keep her next visit at the agreed upon time to discuss these results.  Objective:   Blood pressure (!) 88/56, pulse 68, temperature 97.8 F (36.6 C), height 5' (1.524 m), weight 137 lb (62.1 kg), SpO2 98 %. Body mass index is 26.76 kg/m.  General: Cooperative, alert, well developed, in no acute distress. HEENT: Conjunctivae and lids unremarkable. Cardiovascular: Regular rhythm.  Lungs: Normal work of breathing. Neurologic: No focal deficits.   Lab Results  Component Value Date   CREATININE 0.87 12/19/2019   BUN 16 12/19/2019   NA 140 12/19/2019   K 3.9 12/19/2019   CL 105 12/19/2019   CO2 28 12/19/2019   Lab Results  Component Value Date   ALT 16 12/19/2019   AST 19 12/19/2019   ALKPHOS 76 01/22/2016   BILITOT 0.3 12/19/2019   Lab Results  Component Value Date   HGBA1C 5.3 12/19/2019   Lab Results  Component  Value Date   INSULIN 8.1 06/25/2020   INSULIN 8.5 01/23/2020   Lab Results  Component Value Date   TSH 3.52 12/19/2019   Lab Results  Component Value Date   CHOL 233 (H) 06/25/2020   HDL 73 06/25/2020   LDLCALC 141 (H) 06/25/2020   TRIG 111 06/25/2020   CHOLHDL 3.2 06/25/2020   Lab Results  Component Value Date   VD25OH 94.9 11/18/2020   VD25OH 57.7 06/25/2020   VD25OH 38.7 01/23/2020   Lab Results  Component Value Date   WBC 8.8 12/19/2019   HGB 12.8 12/19/2019   HCT 38.5 12/19/2019   MCV 92.3 12/19/2019   PLT 245 12/19/2019   Lab Results  Component Value Date   FERRITIN 31.7 10/20/2014    Attestation Statements:   Reviewed by clinician on day of visit: allergies, medications, problem list, medical history, surgical history, family history, social history, and previous encounter notes.   I, Jackson Latino, RMA, am acting as Energy manager for [employer], DO.  I have reviewed the above documentation for accuracy and completeness, and I agree with the above. [name], D.O.  The [place] was signed into law in 2016 which includes the topic of electronic health records.  This provides immediate access to information in MyChart.  This includes consultation notes, operative notes, office notes, lab results and pathology reports.  If you have any questions about what you read please let us know at your next visit so we can discuss your concerns and take corrective action if need be.  We are right here with you.

## 2021-02-15 ENCOUNTER — Ambulatory Visit (INDEPENDENT_AMBULATORY_CARE_PROVIDER_SITE_OTHER): Payer: No Typology Code available for payment source | Admitting: Family Medicine

## 2021-02-25 ENCOUNTER — Ambulatory Visit (INDEPENDENT_AMBULATORY_CARE_PROVIDER_SITE_OTHER): Payer: No Typology Code available for payment source | Admitting: Family Medicine

## 2021-02-25 ENCOUNTER — Other Ambulatory Visit: Payer: Self-pay

## 2021-02-25 ENCOUNTER — Encounter (INDEPENDENT_AMBULATORY_CARE_PROVIDER_SITE_OTHER): Payer: Self-pay | Admitting: Family Medicine

## 2021-02-25 VITALS — BP 99/64 | HR 69 | Temp 97.6°F | Ht 60.0 in | Wt 136.0 lb

## 2021-02-25 DIAGNOSIS — E8881 Metabolic syndrome: Secondary | ICD-10-CM

## 2021-02-25 DIAGNOSIS — R0602 Shortness of breath: Secondary | ICD-10-CM | POA: Diagnosis not present

## 2021-02-25 DIAGNOSIS — Z9189 Other specified personal risk factors, not elsewhere classified: Secondary | ICD-10-CM

## 2021-02-25 DIAGNOSIS — E669 Obesity, unspecified: Secondary | ICD-10-CM

## 2021-02-25 DIAGNOSIS — E559 Vitamin D deficiency, unspecified: Secondary | ICD-10-CM | POA: Diagnosis not present

## 2021-02-25 DIAGNOSIS — Z6833 Body mass index (BMI) 33.0-33.9, adult: Secondary | ICD-10-CM

## 2021-02-25 MED ORDER — RYBELSUS 14 MG PO TABS: 14.0000 mg | ORAL_TABLET | Freq: Every day | ORAL | 0 refills | Status: DC

## 2021-02-25 MED ORDER — VITAMIN D (ERGOCALCIFEROL) 1.25 MG (50000 UNIT) PO CAPS: ORAL_CAPSULE | ORAL | 0 refills | Status: DC

## 2021-02-25 NOTE — Progress Notes (Signed)
Chief Complaint:   OBESITY [name] is here to discuss her progress with her obesity treatment plan along with follow-up of her obesity related diagnoses. [name] is on the Category 3 Plan plus 100-150 calories to dinner and states she is following her eating plan approximately 50% of the time. [name] states she is on the elliptical for 40 minutes 3-4 times per week.  Today's visit was #: 33 Starting weight: 171 lbs Starting date: 01/23/2020 Today's weight: 136 lbs Today's date: 02/25/2021 Total lbs lost to date: 35 Total lbs lost since last in-office visit: 1  Interim History: [name] notes the past couple of weeks she has had more celebration eating, but she did portion control and smarter choices. She is still working out the same amount.  Subjective:   1. Shortness of breath on exertion [name] notes increasing shortness of breath with exercising and seems to be worsening over time with weight gain. She notes getting out of breath sooner with activity than she used to. This has not gotten worse recently. [name] denies shortness of breath at rest or orthopnea.  2. Insulin resistance [name] has a diagnosis of insulin resistance based on her elevated fasting insulin level >5. She continues to work on diet and exercise to decrease her risk of diabetes.  3. Vitamin D deficiency [name] is currently taking prescription vitamin D 50,000 IU every 10 days. She denies nausea, vomiting or muscle weakness.  4. At risk for dehydration [name] is at risk for dehydration due to her increased exercise.  Assessment/Plan:   Orders Placed This Encounter  Procedures   Insulin, random   Lipid Panel With LDL/HDL Ratio   VITAMIN D 25 Hydroxy (Vit-D Deficiency, Fractures)    Medications Discontinued During This Encounter  Medication Reason   Semaglutide (RYBELSUS) 14 MG TABS Reorder   Vitamin D, Ergocalciferol, (DRISDOL) 1.25 MG (50000 UNIT) CAPS capsule Reorder      Meds ordered this encounter  Medications   Semaglutide (RYBELSUS) 14 MG TABS    Sig: Take 14 mg by mouth daily.    Dispense:  30 tablet    Refill:  0    30 d supply;  ** OV for RF **   Do not send RF request   Vitamin D, Ergocalciferol, (DRISDOL) 1.25 MG (50000 UNIT) CAPS capsule    Sig: 1 po q 10 days    Dispense:  3 capsule    Refill:  0    30 d supply;  ov for RF     1. Shortness of breath on exertion [name]'s repeat IC was lower due to weight loss. She was changed from Category 3 to Category 2. Will continue to monitor closely.  2. Insulin resistance We will check labs today, and we will refill Rybelsus for 1 month. [name] will continue to work on weight loss, exercise, and decreasing simple carbohydrates to help decrease the risk of diabetes. [name] agreed to follow-up with [place] as directed to closely monitor her progress.  - Semaglutide (RYBELSUS) 14 MG TABS; Take 14 mg by mouth daily.  Dispense: 30 tablet; Refill: 0  3. Vitamin D deficiency Low Vitamin D level contributes to fatigue and are associated with obesity, breast, and colon cancer. We will check labs today, and we will refill prescription Vitamin D for 1 month. [name] will follow-up for routine testing of Vitamin D, at least 2-3 times per year to avoid over-replacement.  - Vitamin D, Ergocalciferol, (DRISDOL) 1.25 MG (50000 UNIT) CAPS capsule; 1 po q  10 days  Dispense: 3 capsule; Refill: 0  4. At risk for dehydration [name] was given approximately 9 minutes dehydration prevention counseling today. [name] is at risk for dehydration due to weight loss and current medication(s). She was encouraged to hydrate and monitor fluid status to avoid dehydration as well as weight loss plateaus.   5. Obesity with current BMI of 26.7 [name] is currently in the action stage of change. As such, her goal is to continue with weight loss efforts. She has agreed to change to the Category 2 Plan dur to new IC results  today.   We will draw FLP, Vit D, and insulin today and we will go over the results at her next office visit.   Exercise goals: As is.  Behavioral modification strategies: planning for success.  [name] has agreed to follow-up with our clinic in 4 weeks. She was informed of the importance of frequent follow-up visits to maximize her success with intensive lifestyle modifications for her multiple health conditions.   Objective:   Blood pressure 99/64, pulse 69, temperature 97.6 F (36.4 C), height 5' (1.524 m), weight 136 lb (61.7 kg), SpO2 99 %. Body mass index is 26.56 kg/m.  General: Cooperative, alert, well developed, in no acute distress. HEENT: Conjunctivae and lids unremarkable. Cardiovascular: Regular rhythm.  Lungs: Normal work of breathing. Neurologic: No focal deficits.   Lab Results  Component Value Date   CREATININE 0.87 12/19/2019   BUN 16 12/19/2019   NA 140 12/19/2019   K 3.9 12/19/2019   CL 105 12/19/2019   CO2 28 12/19/2019   Lab Results  Component Value Date   ALT 16 12/19/2019   AST 19 12/19/2019   ALKPHOS 76 01/22/2016   BILITOT 0.3 12/19/2019   Lab Results  Component Value Date   HGBA1C 5.3 12/19/2019   Lab Results  Component Value Date   INSULIN 8.1 06/25/2020   INSULIN 8.5 01/23/2020   Lab Results  Component Value Date   TSH 3.52 12/19/2019   Lab Results  Component Value Date   CHOL 233 (H) 06/25/2020   HDL 73 06/25/2020   LDLCALC 141 (H) 06/25/2020   TRIG 111 06/25/2020   CHOLHDL 3.2 06/25/2020   Lab Results  Component Value Date   VD25OH 94.9 11/18/2020   VD25OH 57.7 06/25/2020   VD25OH 38.7 01/23/2020   Lab Results  Component Value Date   WBC 8.8 12/19/2019   HGB 12.8 12/19/2019   HCT 38.5 12/19/2019   MCV 92.3 12/19/2019   PLT 245 12/19/2019   Lab Results  Component Value Date   FERRITIN 31.7 10/20/2014   Attestation Statements:   Reviewed by clinician on day of visit: allergies, medications, problem list,  medical history, surgical history, family history, social history, and previous encounter notes.   Trude Mcburney, am acting as transcriptionist for [employer], DO.  I have reviewed the above documentation for accuracy and completeness, and I agree with the above. [name], D.O.  The 21st Century Cures Act was signed into law in 2016 which includes the topic of electronic health records.  This provides immediate access to information in MyChart.  This includes consultation notes, operative notes, office notes, lab results and pathology reports.  If you have any questions about what you read please let us know at your next visit so we can discuss your concerns and take corrective action if need be.  We are right here with you.

## 2021-02-26 LAB — LIPID PANEL WITH LDL/HDL RATIO
Cholesterol, Total: 213 mg/dL — ABNORMAL HIGH (ref 100–199)
HDL: 69 mg/dL (ref >39)
LDL Chol Calc (NIH): 132 mg/dL — ABNORMAL HIGH (ref 0–99)
LDL/HDL Ratio: 1.9 ratio (ref 0.0–3.2)
Triglycerides: 65 mg/dL (ref 0–149)
VLDL Cholesterol Cal: 12 mg/dL (ref 5–40)

## 2021-02-26 LAB — INSULIN, RANDOM: INSULIN: 8.6 u[IU]/mL (ref 2.6–24.9)

## 2021-02-26 LAB — VITAMIN D 25 HYDROXY (VIT D DEFICIENCY, FRACTURES): Vit D, 25-Hydroxy: 56.2 ng/mL (ref 30.0–100.0)

## 2021-03-11 ENCOUNTER — Telehealth (HOSPITAL_BASED_OUTPATIENT_CLINIC_OR_DEPARTMENT_OTHER): Payer: No Typology Code available for payment source | Admitting: Psychiatry

## 2021-03-11 ENCOUNTER — Other Ambulatory Visit: Payer: Self-pay

## 2021-03-11 DIAGNOSIS — F331 Major depressive disorder, recurrent, moderate: Secondary | ICD-10-CM | POA: Diagnosis not present

## 2021-03-11 DIAGNOSIS — F900 Attention-deficit hyperactivity disorder, predominantly inattentive type: Secondary | ICD-10-CM | POA: Diagnosis not present

## 2021-03-11 DIAGNOSIS — F411 Generalized anxiety disorder: Secondary | ICD-10-CM

## 2021-03-11 DIAGNOSIS — G4700 Insomnia, unspecified: Secondary | ICD-10-CM

## 2021-03-11 MED ORDER — BUSPIRONE HCL 30 MG PO TABS: 30.0000 mg | ORAL_TABLET | Freq: Two times a day (BID) | ORAL | 0 refills | Status: DC

## 2021-03-11 MED ORDER — BUPROPION HCL ER (XL) 150 MG PO TB24: ORAL_TABLET | Freq: Every day | ORAL | 0 refills | Status: DC

## 2021-03-11 MED ORDER — ARIPIPRAZOLE 10 MG PO TABS: 10.0000 mg | ORAL_TABLET | Freq: Every day | ORAL | 0 refills | Status: DC

## 2021-03-11 MED ORDER — ATOMOXETINE HCL 25 MG PO CAPS
25.0000 mg | ORAL_CAPSULE | Freq: Two times a day (BID) | ORAL | 1 refills | Status: DC
Start: 2021-03-11 — End: 2021-05-06

## 2021-03-11 MED ORDER — LORAZEPAM 1 MG PO TABS: ORAL_TABLET | Freq: Every day | ORAL | 1 refills | Status: DC | PRN

## 2021-03-11 NOTE — Progress Notes (Signed)
Virtual Visit via Video Note  I connected with CELESTER MORGAN on 03/11/21 at  1:00 PM EST by a video enabled telemedicine application and verified that I am speaking with the correct person using two identifiers.  Location: Patient: home Provider: office   I discussed the limitations of evaluation and management by telemedicine and the availability of in person appointments. The patient expressed understanding and agreed to proceed.  History of Present Illness: "I feel like I am feeling better". Her anxiety has significantly decreased. It is no longer all day, everyday but new every few days. She is taking Ativan a few times a week. Khylei is sleeping well but her energy remains low. Her mood is stable. She feels depressed about a few times a month and on those she is unmotivated and feels "blah". She doesn't want to go out or do things with her kids. Natalea has to force herself to do things on those days. She enjoys her when she does things. Allie denies SI/HI. Briyonna states Adderall is effective but does seem to increase her anxiety. The Ativan does help. She is wondering if she should stop the Adderall.    Observations/Objective: Psychiatric Specialty Exam: ROS  There were no vitals taken for this visit.There is no height or weight on file to calculate BMI.  General Appearance: Casual and Neat  Eye Contact:  Good  Speech:  Clear and Coherent and Normal Rate  Volume:  Normal  Mood:  Anxious  Affect:  Congruent  Thought Process:  Goal Directed, Linear, and Descriptions of Associations: Intact  Orientation:  Full (Time, Place, and Person)  Thought Content:  Logical  Suicidal Thoughts:  No  Homicidal Thoughts:  No  Memory:  Immediate;   Good  Judgement:  Good  Insight:  Good  Psychomotor Activity:  Normal  Concentration:  Concentration: Good  Recall:  Good  Fund of Knowledge:  Good  Language:  Good  Akathisia:  No  Handed:  Right  AIMS (if indicated):     Assets:   Communication Skills Desire for Improvement Financial Resources/Insurance Housing Intimacy Leisure Time Physical Health Resilience Social Support Talents/Skills Transportation Vocational/Educational  ADL's:  Intact  Cognition:  WNL  Sleep:        Assessment and Plan: Depression screen [facility] 2/9 03/11/2021 11/19/2020 10/01/2020 01/23/2020 06/20/2014  Decreased Interest 0 1 0 2 0  Down, Depressed, Hopeless 1 1 1 3 1   PHQ - 2 Score 1 2 1 5 1   Altered sleeping - 1 - 1 -  Tired, decreased energy - 1 - 3 -  Change in appetite - 0 - 2 -  Feeling bad or failure about yourself  - 1 - 3 -  Trouble concentrating - 0 - 3 -  Moving slowly or fidgety/restless - 1 - 1 -  Suicidal thoughts - 0 - 2 -  PHQ-9 Score - 6 - 20 -  Difficult doing work/chores - Somewhat difficult - - -  Some recent data might be hidden    Flowsheet Row Video Visit from 03/11/2021 in [facility] Video Visit from 11/19/2020 in [facility] Video Visit from 10[facility]01/2020 in [facility]  C-SSRS RISK CATEGORY No Risk No Risk No Risk      D/c Adderall due to anxiety  Start Straterra 20mg  po BID  1. Attention deficit hyperactivity disorder (ADHD), predominantly inattentive type - atomoxetine (STRATTERA) 25 MG capsule; Take 1 capsule (25 mg total) by mouth  2 (two) times daily with a meal.  Dispense: 60 capsule; Refill: 1  2. Major depressive disorder, recurrent episode, moderate (HCC) - ARIPiprazole (ABILIFY) 10 MG tablet; Take 1 tablet (10 mg total) by mouth daily.  Dispense: 90 tablet; Refill: 0 - buPROPion (WELLBUTRIN XL) 150 MG 24 hr tablet; TAKE 2 TABLETS BY MOUTH ONCE A DAY  Dispense: 180 tablet; Refill: 0 - busPIRone (BUSPAR) 30 MG tablet; Take 1 tablet (30 mg total) by mouth 2 (two) times daily.  Dispense: 180 tablet; Refill: 0  3. GAD (generalized anxiety disorder) - busPIRone (BUSPAR) 30 MG tablet;  Take 1 tablet (30 mg total) by mouth 2 (two) times daily.  Dispense: 180 tablet; Refill: 0 - LORazepam (ATIVAN) 1 MG tablet; TAKE 1 TABLET BY MOUTH ONCE A DAY AS NEEDED FOR ANXIETY  Dispense: 30 tablet; Refill: 1  4. Insomnia, unspecified type - busPIRone (BUSPAR) 30 MG tablet; Take 1 tablet (30 mg total) by mouth 2 (two) times daily.  Dispense: 180 tablet; Refill: 0 - LORazepam (ATIVAN) 1 MG tablet; TAKE 1 TABLET BY MOUTH ONCE A DAY AS NEEDED FOR ANXIETY  Dispense: 30 tablet; Refill: 1   Follow Up Instructions: In 4-8 weeks or sooner if needed   I discussed the assessment and treatment plan with the patient. The patient was provided an opportunity to ask questions and all were answered. The patient agreed with the plan and demonstrated an understanding of the instructions.   The patient was advised to call back or seek an in-person evaluation if the symptoms worsen or if the condition fails to improve as anticipated.  I provided 16 minutes of non-face-to-face time during this encounter.   Oletta Darter, MD

## 2021-03-25 ENCOUNTER — Other Ambulatory Visit: Payer: Self-pay

## 2021-03-25 ENCOUNTER — Ambulatory Visit (INDEPENDENT_AMBULATORY_CARE_PROVIDER_SITE_OTHER): Payer: No Typology Code available for payment source | Admitting: Family Medicine

## 2021-03-25 ENCOUNTER — Encounter (INDEPENDENT_AMBULATORY_CARE_PROVIDER_SITE_OTHER): Payer: Self-pay | Admitting: Family Medicine

## 2021-03-25 VITALS — BP 102/68 | HR 72 | Temp 97.8°F | Ht 60.0 in | Wt 132.0 lb

## 2021-03-25 DIAGNOSIS — Z9189 Other specified personal risk factors, not elsewhere classified: Secondary | ICD-10-CM | POA: Diagnosis not present

## 2021-03-25 DIAGNOSIS — E559 Vitamin D deficiency, unspecified: Secondary | ICD-10-CM | POA: Diagnosis not present

## 2021-03-25 DIAGNOSIS — E8881 Metabolic syndrome: Secondary | ICD-10-CM

## 2021-03-25 DIAGNOSIS — Z6833 Body mass index (BMI) 33.0-33.9, adult: Secondary | ICD-10-CM

## 2021-03-25 DIAGNOSIS — E7849 Other hyperlipidemia: Secondary | ICD-10-CM | POA: Diagnosis not present

## 2021-03-25 DIAGNOSIS — E669 Obesity, unspecified: Secondary | ICD-10-CM

## 2021-03-25 MED ORDER — RYBELSUS 14 MG PO TABS
14.0000 mg | ORAL_TABLET | Freq: Every day | ORAL | 0 refills | Status: DC
Start: 2021-03-25 — End: 2021-04-15

## 2021-03-25 MED ORDER — VITAMIN D (ERGOCALCIFEROL) 1.25 MG (50000 UNIT) PO CAPS: ORAL_CAPSULE | ORAL | 0 refills | Status: DC

## 2021-03-30 NOTE — Progress Notes (Addendum)
Chief Complaint:   OBESITY [name] is here to discuss her progress with her obesity treatment plan along with follow-up of her obesity related diagnoses. [name] is on the Category 2 Plan and states she is following her eating plan approximately 75% of the time. [name] states she is on the elliptical 40 minutes 3 times per week.  Today's visit was #: 20 Starting weight: 171 lbs Starting date: 01/23/2020 Today's weight: 132 lbs Today's date: 03/25/2021 Total lbs lost to date: 39 Total lbs lost since last in-office visit: 4  Interim History: Pt worked out extra over the Thanksgiving holiday because she knew she'd eat more. She worked out for almost an hour each time, instead of 40 minutes; uses elliptical at [employer].   Also, Pt feels Rybelsus has helped decrease her appetite, and made it easier to forego some holiday foods/ goodies.     Mood has been very good lately- stable.  Still sees psychiatrist for med mgt of mood and her counselor as well regularly.    Pt has been doing a great job taking care of herself mentally and physically   Subjective:   1. Other hyperlipidemia Discussed labs with patient today.  Improved LDL. [name] has hyperlipidemia and has been trying to improve her cholesterol levels with intensive lifestyle modification including a low saturated fat diet, exercise and weight loss. She denies any chest pain, claudication or myalgias.   2. Vitamin D deficiency Discussed labs with patient today.  [name] is tolerating medication(s) well without side effects.  Medication compliance is good and patient appears to be taking it as prescribed.  Denies additional concerns regarding this condition. Vit D level is at goal now. We decreased dose to every 10 days approx. 3 months ago.   3. Insulin resistance Discussed labs with patient today.  [name] is tolerating medication(s) well without side effects.  Medication compliance is good and  patient appears to be taking it as prescribed.  Denies additional concerns regarding this condition.    4. At risk for heart disease [name] is at a higher than average risk for cardiovascular disease due to obesity, hyperlipidemia, and insulin resistance.   Assessment/Plan:  No orders of the defined types were placed in this encounter.   Medications Discontinued During This Encounter  Medication Reason   Semaglutide (RYBELSUS) 14 MG TABS Reorder   Vitamin D, Ergocalciferol, (DRISDOL) 1.25 MG (50000 UNIT) CAPS capsule Reorder     Meds ordered this encounter  Medications   Semaglutide (RYBELSUS) 14 MG TABS    Sig: Take 14 mg by mouth daily.    Dispense:  30 tablet    Refill:  0    30 d supply;  ** OV for RF **   Do not send RF request   Vitamin D, Ergocalciferol, (DRISDOL) 1.25 MG (50000 UNIT) CAPS capsule    Sig: 1 po q 10 days    Dispense:  3 capsule    Refill:  0    30 d supply;  ov for RF      1. Other hyperlipidemia Cardiovascular risk and specific lipid/LDL goals reviewed.  We discussed several lifestyle modifications today and [name] will continue to work on diet, exercise and weight loss efforts. Orders and follow up as documented in patient record. Improved LDL. Pt will continue exercise and prudent nutritional plan. We will monitor labs every 4-6 months.  Counseling Intensive lifestyle modifications are the first line treatment for this issue. Dietary changes: Increase  soluble fiber. Decrease simple carbohydrates. Exercise changes: Moderate to vigorous-intensity aerobic activity 150 minutes per week if tolerated. Lipid-lowering medications: see documented in medical record.  2. Vitamin D deficiency Low Vitamin D level contributes to fatigue and are associated with obesity, breast, and colon cancer. She agrees to continue to take prescription Vitamin D 50,000 IU every 10 days and will follow-up for routine testing of Vitamin D, at least 2-3 times per year to  avoid over-replacement.  Refill- Vitamin D, Ergocalciferol, (DRISDOL) 1.25 MG (50000 UNIT) CAPS capsule; 1 po q 10 days  Dispense: 3 capsule; Refill: 0  3. Insulin resistance [name] will continue to work on weight loss, exercise, and decreasing simple carbohydrates to help decrease the risk of diabetes. [name] agreed to follow-up with [place] as directed to closely monitor her progress. Little change in fasting insulin from prior. Continue current treatment plan.  Refill- Semaglutide (RYBELSUS) 14 MG TABS; Take 14 mg by mouth daily.  Dispense: 30 tablet; Refill: 0  4. At risk for heart disease Due to [name]'s current state of health and medical condition(s), she is at a higher risk for heart disease.  This puts the patient at much greater risk to subsequently develop cardiopulmonary conditions that can significantly affect patient's quality of life in a negative manner.    At least 9 minutes were spent on counseling [name] about these concerns today, and I stressed the importance of reversing risks factors of obesity, especially truncal and visceral fat, hypertension, hyperlipidemia, and pre-diabetes.  The initial goal is to lose at least 5-10% of starting weight to help reduce these risk factors.  Counseling:  Intensive lifestyle modifications were discussed with [name] as the most appropriate first line of treatment.  she will continue to work on diet, exercise, and weight loss efforts.  We will continue to reassess these conditions on a fairly regular basis in an attempt to decrease the patient's overall morbidity and mortality.  Evidence-based interventions for health behavior change were utilized today including the discussion of self monitoring techniques, problem-solving barriers, and SMART goal setting techniques.  Specifically, regarding patient's less desirable eating habits and patterns, we employed the technique of small changes when [name] has not been able to fully commit to  her prudent nutritional plan.   5. Obesity BMI today is 12  [name] is currently in the action stage of change. As such, her goal is to continue with weight loss efforts. She has agreed to the Category 2 Plan or the Category 3 Plan on harder work out days if she is hungry.   [name] weight goal is upper 120's at this time.  -  Continue Rybelsus and prudent nutritional plan with exercise.  Exercise goals:  As is  Behavioral modification strategies: increasing water intake, increasing high fiber foods, and no skipping meals.  [name] has agreed to follow-up with our clinic in 2 weeks. She was informed of the importance of frequent follow-up visits to maximize her success with intensive lifestyle modifications for her multiple health conditions.    Objective:   Blood pressure 102/68, pulse 72, temperature 97.8 F (36.6 C), height 5' (1.524 m), weight 132 lb (59.9 kg), SpO2 99 %. Body mass index is 25.78 kg/m.  General: Cooperative, alert, well developed, in no acute distress. HEENT: Conjunctivae and lids unremarkable. Cardiovascular: Regular rhythm.  Lungs: Normal work of breathing. Neurologic: No focal deficits.   Lab Results  Component Value Date   CREATININE 0.87 12/19/2019   BUN 16 12/19/2019   NA 140  12/19/2019   K 3.9 12/19/2019   CL 105 12/19/2019   CO2 28 12/19/2019   Lab Results  Component Value Date   ALT 16 12/19/2019   AST 19 12/19/2019   ALKPHOS 76 01/22/2016   BILITOT 0.3 12/19/2019   Lab Results  Component Value Date   HGBA1C 5.3 12/19/2019   Lab Results  Component Value Date   INSULIN 8.6 02/25/2021   INSULIN 8.1 06/25/2020   INSULIN 8.5 01/23/2020   Lab Results  Component Value Date   TSH 3.52 12/19/2019   Lab Results  Component Value Date   CHOL 213 (H) 02/25/2021   HDL 69 02/25/2021   LDLCALC 132 (H) 02/25/2021   TRIG 65 02/25/2021   CHOLHDL 3.2 06/25/2020   Lab Results  Component Value Date   VD25OH 56.2 02/25/2021   VD25OH 94.9  11/18/2020   VD25OH 57.7 06/25/2020   Lab Results  Component Value Date   WBC 8.8 12/19/2019   HGB 12.8 12/19/2019   HCT 38.5 12/19/2019   MCV 92.3 12/19/2019   PLT 245 12/19/2019   Lab Results  Component Value Date   FERRITIN 31.7 10/20/2014    Attestation Statements:   Reviewed by clinician on day of visit: allergies, medications, problem list, medical history, surgical history, family history, social history, and previous encounter notes.  Coral Ceo, CMA, am acting as transcriptionist for [employer], DO.  I have reviewed the above documentation for accuracy and completeness, and I agree with the above. [name], D.O.  The [place] was signed into law in 2016 which includes the topic of electronic health records.  This provides immediate access to information in MyChart.  This includes consultation notes, operative notes, office notes, lab results and pathology reports.  If you have any questions about what you read please let us know at your next visit so we can discuss your concerns and take corrective action if need be.  We are right here with you.

## 2021-04-15 ENCOUNTER — Other Ambulatory Visit: Payer: Self-pay

## 2021-04-15 ENCOUNTER — Encounter (INDEPENDENT_AMBULATORY_CARE_PROVIDER_SITE_OTHER): Payer: Self-pay | Admitting: Family Medicine

## 2021-04-15 ENCOUNTER — Ambulatory Visit (INDEPENDENT_AMBULATORY_CARE_PROVIDER_SITE_OTHER): Payer: No Typology Code available for payment source | Admitting: Family Medicine

## 2021-04-15 VITALS — BP 98/65 | HR 71 | Temp 97.9°F | Ht 60.0 in | Wt 133.0 lb

## 2021-04-15 DIAGNOSIS — Z6826 Body mass index (BMI) 26.0-26.9, adult: Secondary | ICD-10-CM

## 2021-04-15 DIAGNOSIS — E669 Obesity, unspecified: Secondary | ICD-10-CM | POA: Diagnosis not present

## 2021-04-15 DIAGNOSIS — E8881 Metabolic syndrome: Secondary | ICD-10-CM

## 2021-04-15 DIAGNOSIS — E559 Vitamin D deficiency, unspecified: Secondary | ICD-10-CM

## 2021-04-15 MED ORDER — VITAMIN D (ERGOCALCIFEROL) 1.25 MG (50000 UNIT) PO CAPS: ORAL_CAPSULE | ORAL | 0 refills | Status: DC

## 2021-04-15 MED ORDER — RYBELSUS 14 MG PO TABS
14.0000 mg | ORAL_TABLET | Freq: Every day | ORAL | 0 refills | Status: DC
Start: 2021-04-15 — End: 2021-05-06

## 2021-04-15 NOTE — Progress Notes (Signed)
Chief Complaint:   OBESITY [name] is here to discuss her progress with her obesity treatment plan along with follow-up of her obesity related diagnoses. [name] is on the Category 2 Plan and the Category 3 Plan and states she is following her eating plan approximately 65% of the time. [name] states she is using the elliptical for 40 minutes 3 times per week.  Today's visit was #: 21 Starting weight: 171 lbs Starting date: 01/23/2020 Today's weight: 133 lbs Today's date: 04/15/2021 Total lbs lost to date: 38 lbs Total lbs lost since last in-office visit: 0  Interim History: [name] had a birthday yesterday and she went out to eat. She feels she overate but enjoyed it. She is still working out, and she is still going to her Veterinary surgeon.  Subjective:   1. Vitamin D deficiency [name] is currently taking prescription vitamin D 50,000 IU every 10 days. Her Vitamin D level was at goal 1 month ago. She denies nausea, vomiting or muscle weakness.   2. Insulin resistance [name] has a diagnosis of insulin resistance based on her elevated fasting insulin level >5. She has good control of her hunger. She denies emotional eating either. She continues to work on diet and exercise to decrease her risk of diabetes.  Assessment/Plan:  No orders of the defined types were placed in this encounter.   Medications Discontinued During This Encounter  Medication Reason   Semaglutide (RYBELSUS) 14 MG TABS Reorder   Vitamin D, Ergocalciferol, (DRISDOL) 1.25 MG (50000 UNIT) CAPS capsule Reorder     Meds ordered this encounter  Medications   Semaglutide (RYBELSUS) 14 MG TABS    Sig: Take 14 mg by mouth daily.    Dispense:  30 tablet    Refill:  0    30 d supply;  ** OV for RF **   Do not send RF request   Vitamin D, Ergocalciferol, (DRISDOL) 1.25 MG (50000 UNIT) CAPS capsule    Sig: 1 po q 10 days    Dispense:  3 capsule    Refill:  0    30 d supply;  ov for RF     1. Vitamin D  deficiency Low Vitamin D level contributes to fatigue and are associated with obesity, breast, and colon cancer. We will refill prescription Vitamin D 50,000 IU for 1 month. [name] will follow-up for routine testing of Vitamin D, at least 2-3 times per year to avoid over-replacement.  - Vitamin D, Ergocalciferol, (DRISDOL) 1.25 MG (50000 UNIT) CAPS capsule; 1 po q 10 days  Dispense: 3 capsule; Refill: 0  2. Insulin resistance We will refill Rybelsus 14 mg for 1 month. [name] will continue to work on weight loss, exercise, and decreasing simple carbohydrates to help decrease the risk of diabetes. [name] agreed to follow-up with [place] as directed to closely monitor her progress.  - Semaglutide (RYBELSUS) 14 MG TABS; Take 14 mg by mouth daily.  Dispense: 30 tablet; Refill: 0  3. Obesity with current BMI of 26.1 [name] is currently in the action stage of change. As such, her goal is to continue with weight loss efforts. She has agreed to change to the Category 2 Plan.   Exercise goals:  As is.  Behavioral modification strategies: celebration eating strategies and avoiding temptations.  [name] has agreed to follow-up with our clinic in 3 weeks. She was informed of the importance of frequent follow-up visits to maximize her success with intensive lifestyle modifications for her multiple health conditions.  Objective:   Blood pressure 98/65, pulse 71, temperature 97.9 F (36.6 C), height 5' (1.524 m), weight 133 lb (60.3 kg), SpO2 98 %. Body mass index is 25.97 kg/m.  General: Cooperative, alert, well developed, in no acute distress. HEENT: Conjunctivae and lids unremarkable. Cardiovascular: Regular rhythm.  Lungs: Normal work of breathing. Neurologic: No focal deficits.   Lab Results  Component Value Date   CREATININE 0.87 12/19/2019   BUN 16 12/19/2019   NA 140 12/19/2019   K 3.9 12/19/2019   CL 105 12/19/2019   CO2 28 12/19/2019   Lab Results  Component Value Date    ALT 16 12/19/2019   AST 19 12/19/2019   ALKPHOS 76 01/22/2016   BILITOT 0.3 12/19/2019   Lab Results  Component Value Date   HGBA1C 5.3 12/19/2019   Lab Results  Component Value Date   INSULIN 8.6 02/25/2021   INSULIN 8.1 06/25/2020   INSULIN 8.5 01/23/2020   Lab Results  Component Value Date   TSH 3.52 12/19/2019   Lab Results  Component Value Date   CHOL 213 (H) 02/25/2021   HDL 69 02/25/2021   LDLCALC 132 (H) 02/25/2021   TRIG 65 02/25/2021   CHOLHDL 3.2 06/25/2020   Lab Results  Component Value Date   VD25OH 56.2 02/25/2021   VD25OH 94.9 11/18/2020   VD25OH 57.7 06/25/2020   Lab Results  Component Value Date   WBC 8.8 12/19/2019   HGB 12.8 12/19/2019   HCT 38.5 12/19/2019   MCV 92.3 12/19/2019   PLT 245 12/19/2019   Lab Results  Component Value Date   FERRITIN 31.7 10/20/2014   Attestation Statements:   Reviewed by clinician on day of visit: allergies, medications, problem list, medical history, surgical history, family history, social history, and previous encounter notes.   I, Lizbeth Bark, RMA, am acting as Location manager for [employer], DO.  I have reviewed the above documentation for accuracy and completeness, and I agree with the above. [name], D.O.  The [place] was signed into law in 2016 which includes the topic of electronic health records.  This provides immediate access to information in MyChart.  This includes consultation notes, operative notes, office notes, lab results and pathology reports.  If you have any questions about what you read please let us know at your next visit so we can discuss your concerns and take corrective action if need be.  We are right here with you.

## 2021-04-20 ENCOUNTER — Ambulatory Visit: Payer: PRIVATE HEALTH INSURANCE | Admitting: Family Medicine

## 2021-04-20 ENCOUNTER — Telehealth: Payer: Self-pay | Admitting: Family Medicine

## 2021-04-20 ENCOUNTER — Encounter: Payer: Self-pay | Admitting: Family Medicine

## 2021-04-20 VITALS — BP 98/70 | HR 78 | Temp 98.5°F | Wt 137.1 lb

## 2021-04-20 DIAGNOSIS — J019 Acute sinusitis, unspecified: Secondary | ICD-10-CM

## 2021-04-20 DIAGNOSIS — H66002 Acute suppurative otitis media without spontaneous rupture of ear drum, left ear: Secondary | ICD-10-CM

## 2021-04-20 MED ORDER — AMOXICILLIN-POT CLAVULANATE 875-125 MG PO TABS: 1.0000 | ORAL_TABLET | Freq: Two times a day (BID) | ORAL | 0 refills | Status: DC

## 2021-04-20 NOTE — Progress Notes (Signed)
° °  Subjective:    Patient ID: [name], female    DOB: 1985-11-14, 35 y.o.   MRN: [id number]  HPI Here for 10 days of sinus pressure, PND, ST, and a dry cough. No fever. Now she has had pain in the left ear for 2 days. She saw urgent care on 04-17-21 and she tested positive for mononucleosis (negative for strep). She was not tested for flu or the Covid virus. She tested negative for Covid at home this morning.    Review of Systems  Constitutional: Negative.   HENT:  Positive for congestion, ear pain, postnasal drip, sinus pressure and sore throat.   Eyes: Negative.   Respiratory:  Positive for cough. Negative for chest tightness, shortness of breath and wheezing.       Objective:   Physical Exam Constitutional:      Appearance: Normal appearance. She is not ill-appearing.  HENT:     Right Ear: Tympanic membrane, ear canal and external ear normal.     Ears:     Comments: The left TM is red and tight, there is fluid behind it     Nose: Nose normal.     Mouth/Throat:     Pharynx: Oropharynx is clear.  Eyes:     Conjunctiva/sclera: Conjunctivae normal.  Pulmonary:     Effort: Pulmonary effort is normal.     Breath sounds: Normal breath sounds.  Abdominal:     General: Abdomen is flat. Bowel sounds are normal. There is no distension.     Palpations: Abdomen is soft. There is no mass.     Tenderness: There is no abdominal tenderness. There is no guarding or rebound.     Hernia: No hernia is present.     Comments: No HSM   Lymphadenopathy:     Cervical: No cervical adenopathy.  Neurological:     Mental Status: She is alert.          Assessment & Plan:  She seems to have recovered from a mononucleosis infection quite well. Now however she has a sinusitis and left otitis media. We will treat these with 10 days of Augmntin. Add Ibuprofen as needed.  Gershon Crane, MD

## 2021-04-20 NOTE — Telephone Encounter (Signed)
Patient calling in with respiratory symptoms: Shortness of breath, chest pain, palpitations or other red words send to Triage  Does the patient have a fever over 100, cough, congestion, sore throat, runny nose, lost of taste/smell (please list symptoms that patient has)?runny nose, st  What date did symptoms start?04-09-2021 (If over 5 days ago, pt may be scheduled for in person visit)  Have you tested for Covid in the last 5 days? No   If yes, was it positive []  OR negative [] ? If positive in the last 5 days, please schedule virtual visit now. If negative, schedule for an in person OV with the next available provider if PCP has no openings. Please also let patient know they will be tested again (follow the script below)  "you will have to arrive prior to your appt time to be Covid tested. Please park in back of office at the [facility] & call [phone] to let the staff know you have arrived. A staff member will meet you at your car to do a rapid covid test. Once the test has resulted you will be notified by phone of your results to determine if appt will remain an in person visit or be converted to a virtual/phone visit. If you arrive less than before your appt time, your visit will be automatically converted to virtual & any recommended testing will happen AFTER the visit."  Pt has in office visit today 04-20-2021 THINGS TO REMEMBER  If no availability for virtual visit in office,  please schedule another [place] office  If no availability at another [place] office, please instruct patient that they can schedule an evisit or virtual visit through their mychart account. Visits up to 8pm  patients can be seen in office 5 days after positive COVID test

## 2021-04-29 ENCOUNTER — Telehealth (HOSPITAL_COMMUNITY): Payer: No Typology Code available for payment source | Admitting: Psychiatry

## 2021-05-06 ENCOUNTER — Encounter (HOSPITAL_COMMUNITY): Payer: Self-pay | Admitting: Psychiatry

## 2021-05-06 ENCOUNTER — Other Ambulatory Visit: Payer: Self-pay

## 2021-05-06 ENCOUNTER — Telehealth (INDEPENDENT_AMBULATORY_CARE_PROVIDER_SITE_OTHER): Payer: Self-pay

## 2021-05-06 ENCOUNTER — Ambulatory Visit (INDEPENDENT_AMBULATORY_CARE_PROVIDER_SITE_OTHER): Payer: PRIVATE HEALTH INSURANCE | Admitting: Family Medicine

## 2021-05-06 ENCOUNTER — Encounter (INDEPENDENT_AMBULATORY_CARE_PROVIDER_SITE_OTHER): Payer: Self-pay

## 2021-05-06 ENCOUNTER — Encounter (INDEPENDENT_AMBULATORY_CARE_PROVIDER_SITE_OTHER): Payer: Self-pay | Admitting: Family Medicine

## 2021-05-06 ENCOUNTER — Telehealth (HOSPITAL_BASED_OUTPATIENT_CLINIC_OR_DEPARTMENT_OTHER): Payer: PRIVATE HEALTH INSURANCE | Admitting: Psychiatry

## 2021-05-06 VITALS — BP 109/71 | HR 95 | Temp 98.3°F | Ht 60.0 in | Wt 132.0 lb

## 2021-05-06 DIAGNOSIS — E669 Obesity, unspecified: Secondary | ICD-10-CM | POA: Diagnosis not present

## 2021-05-06 DIAGNOSIS — F331 Major depressive disorder, recurrent, moderate: Secondary | ICD-10-CM | POA: Diagnosis not present

## 2021-05-06 DIAGNOSIS — Z6825 Body mass index (BMI) 25.0-25.9, adult: Secondary | ICD-10-CM

## 2021-05-06 DIAGNOSIS — E559 Vitamin D deficiency, unspecified: Secondary | ICD-10-CM

## 2021-05-06 DIAGNOSIS — F411 Generalized anxiety disorder: Secondary | ICD-10-CM

## 2021-05-06 DIAGNOSIS — F900 Attention-deficit hyperactivity disorder, predominantly inattentive type: Secondary | ICD-10-CM | POA: Diagnosis not present

## 2021-05-06 DIAGNOSIS — E8881 Metabolic syndrome: Secondary | ICD-10-CM | POA: Diagnosis not present

## 2021-05-06 DIAGNOSIS — Z9189 Other specified personal risk factors, not elsewhere classified: Secondary | ICD-10-CM

## 2021-05-06 DIAGNOSIS — G4700 Insomnia, unspecified: Secondary | ICD-10-CM | POA: Diagnosis not present

## 2021-05-06 MED ORDER — VITAMIN D (ERGOCALCIFEROL) 1.25 MG (50000 UNIT) PO CAPS: ORAL_CAPSULE | ORAL | 0 refills | Status: DC

## 2021-05-06 MED ORDER — BUSPIRONE HCL 30 MG PO TABS: 30.0000 mg | ORAL_TABLET | Freq: Two times a day (BID) | ORAL | 0 refills | Status: DC

## 2021-05-06 MED ORDER — ARIPIPRAZOLE 10 MG PO TABS: 10.0000 mg | ORAL_TABLET | Freq: Every day | ORAL | 0 refills | Status: DC

## 2021-05-06 MED ORDER — RYBELSUS 14 MG PO TABS: 14.0000 mg | ORAL_TABLET | Freq: Every day | ORAL | 0 refills | Status: DC

## 2021-05-06 MED ORDER — BUPROPION HCL ER (XL) 300 MG PO TB24: 300.0000 mg | ORAL_TABLET | ORAL | 0 refills | Status: DC

## 2021-05-06 MED ORDER — ATOMOXETINE HCL 40 MG PO CAPS: 40.0000 mg | ORAL_CAPSULE | Freq: Every day | ORAL | 0 refills | Status: DC

## 2021-05-06 NOTE — Progress Notes (Signed)
Virtual Visit via Video Note  I connected with [name] on 05/06/21 at 11:30 AM EST by a video enabled telemedicine application and verified that I am speaking with the correct person using two identifiers.  Location: Patient: home Provider: office   I discussed the limitations of evaluation and management by telemedicine and the availability of in person appointments. The patient expressed understanding and agreed to proceed.  History of Present Illness: "I am pretty happy with where I am right now". The meds are effective. [name] is having to swtich to a new therapist since her hold therapist is now out of network. Her depression is stable. [name] has felt depressed for a few random days over the holidays. She still has some anhedonia. It could be due to being sick, not going to the gym and kids being home. Now that she has recovered and the kids are back in school she is starting to feel better. Her sleep is ok most nights but some nights she doesn't sleep well due to anxiety. Her energy is low ut she is still recovering from being sick. Her concentration is better with Strattera but she is forgetting the second dose. She wants to take it all in the morning. [name] some dizziness with Strattera. She denies any falls and states it seems to be getting better lately. Her anxiety comes on about 1x/week when she is not busy and has nothing to distract herself. During that time she over thinks things or thinks about the worst case scenario. It usually results in poor sleep. [name] takes Ativan 2x/week and it remains effective. She denies SI/HI.    Observations/Objective: Psychiatric Specialty Exam: ROS  There were no vitals taken for this visit.There is no height or weight on file to calculate BMI.  General Appearance: Casual and Neat  Eye Contact:  Good  Speech:  Clear and Coherent and Normal Rate  Volume:  Normal  Mood:  Euthymic  Affect:  Full Range  Thought  Process:  Goal Directed, Linear, and Descriptions of Associations: Intact  Orientation:  Full (Time, Place, and Person)  Thought Content:  Logical  Suicidal Thoughts:  No  Homicidal Thoughts:  No  Memory:  Immediate;   Good  Judgement:  Good  Insight:  Good  Psychomotor Activity:  Normal  Concentration:  Concentration: Good  Recall:  Good  Fund of Knowledge:  Good  Language:  Good  Akathisia:  No  Handed:  Right  AIMS (if indicated):     Assets:  Communication Skills Desire for Improvement Financial Resources/Insurance Housing Intimacy Leisure Time Physical Health Resilience Social Support Talents/Skills Transportation Vocational/Educational  ADL's:  Intact  Cognition:  WNL  Sleep:        Assessment and Plan:  Depression screen [facility] 2/9 05/06/2021 04/20/2021 03/11/2021 11/19/2020 10/01/2020  Decreased Interest 1 0 0 1 0  Down, Depressed, Hopeless 1 0 PHQ - 2 Score 2 0 Altered sleeping 1 1 - 1 -  Tired, decreased energy 1 1 - 1 -  Change in appetite 0 0 - 0 -  Feeling bad or failure about yourself  0 0 - 1 -  Trouble concentrating 0 0 - 0 -  Moving slowly or fidgety/restless 0 0 - 1 -  Suicidal thoughts 0 0 - 0 -  PHQ-9 Score 4 2 - 6 -  Difficult doing work/chores Somewhat difficult - - Somewhat difficult -  Some recent data might be hidden  Flowsheet Row Video Visit from 05/06/2021 in [facility] Video Visit from 03/11/2021 in [facility] Video Visit from 11[facility]19/2020 in [facility]  C-SSRS RISK CATEGORY No Risk No Risk No Risk      - change Strattera 40mg  po qD instead of BID dosing for better compliance.   1. Major depressive disorder, recurrent episode, moderate (HCC) - ARIPiprazole (ABILIFY) 10 MG tablet; Take 1 tablet (10 mg total) by mouth daily.  Dispense: 90 tablet; Refill: 0 - busPIRone (BUSPAR) 30 MG tablet; Take 1 tablet  (30 mg total) by mouth 2 (two) times daily.  Dispense: 180 tablet; Refill: 0 - buPROPion (WELLBUTRIN XL) 300 MG 24 hr tablet; Take 1 tablet (300 mg total) by mouth every morning.  Dispense: 90 tablet; Refill: 0  2. GAD (generalized anxiety disorder) - busPIRone (BUSPAR) 30 MG tablet; Take 1 tablet (30 mg total) by mouth 2 (two) times daily.  Dispense: 180 tablet; Refill: 0  3. Insomnia, unspecified type - busPIRone (BUSPAR) 30 MG tablet; Take 1 tablet (30 mg total) by mouth 2 (two) times daily.  Dispense: 180 tablet; Refill: 0  4. Attention deficit hyperactivity disorder (ADHD), predominantly inattentive type - atomoxetine (STRATTERA) 40 MG capsule; Take 1 capsule (40 mg total) by mouth daily.  Dispense: 90 capsule; Refill: 0 - buPROPion (WELLBUTRIN XL) 300 MG 24 hr tablet; Take 1 tablet (300 mg total) by mouth every morning.  Dispense: 90 tablet; Refill: 0   Follow Up Instructions: In 2-3 months or sooner if needed   I discussed the assessment and treatment plan with the patient. The patient was provided an opportunity to ask questions and all were answered. The patient agreed with the plan and demonstrated an understanding of the instructions.   The patient was advised to call back or seek an in-person evaluation if the symptoms worsen or if the condition fails to improve as anticipated.  I provided 15 minutes of non-face-to-face time during this encounter.   Oletta DarterSalina Adrin Julian, MD

## 2021-05-06 NOTE — Telephone Encounter (Signed)
PA initiated via [email or web address] for continuation of Rybelsus 14mg  tablets, taking 1 tablet by mouth once daily.    Key: [id number] Rybelsus 14MG  tablets   Form: [employer] Electronic Prior Authorization Form (2017 NCPDP) Determination: Wait for Determination Please wait for [employer] 2017 NCPDP to return a determination.

## 2021-05-10 NOTE — Progress Notes (Signed)
Chief Complaint:   OBESITY [name] is here to discuss her progress with her obesity treatment plan along with follow-up of her obesity related diagnoses. [name] is on the Category 2 Plan and states she is following her eating plan approximately 50% of the time. [name] states she is using elliptical 40 minutes 3 times per week.  Today's visit was #: 22 Starting weight: 171 lbs Starting date: 01/23/2020 Today's weight: 132 lbs Today's date: 05/06/2021 Total lbs lost to date: 39 Total lbs lost since last in-office visit: 1  Interim History: Pt was sick for a week and didn't eat much or go to the gym. She is back on plan and started back at the gym. She has no issues or concerns. Pt does category 3 while working out regularly at the gym.  Subjective:   1. Insulin resistance [name] has a diagnosis of insulin resistance based on her elevated fasting insulin level >5. She continues to work on diet and exercise to decrease her risk of diabetes. [name] is tolerating medication(s) well without side effects.  Medication compliance is good and patient appears to be taking it as prescribed. Denies additional concerns regarding this condition. Last labs 11/22.   2. Vitamin D deficiency [name] is tolerating medication(s) well without side effects.  Medication compliance is good and patient appears to be taking it as prescribed.  Denies additional concerns regarding this condition. [name] last Vit D was at goal 02/2021.  3. At risk for dehydration [name] is at risk for dehydration due to getting back on higher protein meal plan and resuming exercise.  Assessment/Plan:  No orders of the defined types were placed in this encounter.   Medications Discontinued During This Encounter  Medication Reason   Semaglutide (RYBELSUS) 14 MG TABS Reorder   Vitamin D, Ergocalciferol, (DRISDOL) 1.25 MG (50000 UNIT) CAPS capsule Reorder     Meds ordered this encounter   Medications   Semaglutide (RYBELSUS) 14 MG TABS    Sig: Take 14 mg by mouth daily.    Dispense:  30 tablet    Refill:  0    30 d supply;  ** OV for RF **   Do not send RF request   Vitamin D, Ergocalciferol, (DRISDOL) 1.25 MG (50000 UNIT) CAPS capsule    Sig: 1 po q 10 days    Dispense:  3 capsule    Refill:  0    30 d supply;  ov for RF     1. Insulin resistance [name] will continue to work on weight loss, exercise, and decreasing simple carbohydrates to help decrease the risk of diabetes. [name] agreed to follow-up with [place] as directed to closely monitor her progress.  Refill- Semaglutide (RYBELSUS) 14 MG TABS; Take 14 mg by mouth daily.  Dispense: 30 tablet; Refill: 0  2. Vitamin D deficiency Low Vitamin D level contributes to fatigue and are associated with obesity, breast, and colon cancer. She agrees to continue to take prescription Vitamin D 50,000 IU every 10 days and will follow-up for routine testing of Vitamin D, at least 2-3 times per year to avoid over-replacement.  Refill- Vitamin D, Ergocalciferol, (DRISDOL) 1.25 MG (50000 UNIT) CAPS capsule; 1 po q 10 days  Dispense: 3 capsule; Refill: 0  3. At risk for dehydration [name] was given approximately 9 minutes dehydration prevention counseling today. [name] is at risk for dehydration due to weight loss and current medication(s). She was encouraged to hydrate and monitor fluid status  to avoid dehydration as well as weight loss plateaus.   4. Obesity with current BMI of 25.9  [name] is currently in the action stage of change. As such, her goal is to continue with weight loss efforts. She has agreed to the Category 2 Plan and the Category 3 Plan.   Exercise goals:  As is  Behavioral modification strategies: increasing lean protein intake, decreasing simple carbohydrates, and planning for success.  [name] has agreed to follow-up with our clinic in 4 weeks. She was informed of the importance of frequent  follow-up visits to maximize her success with intensive lifestyle modifications for her multiple health conditions.   Objective:   Blood pressure 109/71, pulse 95, temperature 98.3 F (36.8 C), height 5' (1.524 m), weight 132 lb (59.9 kg), SpO2 98 %. Body mass index is 25.78 kg/m.  General: Cooperative, alert, well developed, in no acute distress. HEENT: Conjunctivae and lids unremarkable. Cardiovascular: Regular rhythm.  Lungs: Normal work of breathing. Neurologic: No focal deficits.   Lab Results  Component Value Date   CREATININE 0.87 12/19/2019   BUN 16 12/19/2019   NA 140 12/19/2019   K 3.9 12/19/2019   CL 105 12/19/2019   CO2 28 12/19/2019   Lab Results  Component Value Date   ALT 16 12/19/2019   AST 19 12/19/2019   ALKPHOS 76 01/22/2016   BILITOT 0.3 12/19/2019   Lab Results  Component Value Date   HGBA1C 5.3 12/19/2019   Lab Results  Component Value Date   INSULIN 8.6 02/25/2021   INSULIN 8.1 06/25/2020   INSULIN 8.5 01/23/2020   Lab Results  Component Value Date   TSH 3.52 12/19/2019   Lab Results  Component Value Date   CHOL 213 (H) 02/25/2021   HDL 69 02/25/2021   LDLCALC 132 (H) 02/25/2021   TRIG 65 02/25/2021   CHOLHDL 3.2 06/25/2020   Lab Results  Component Value Date   VD25OH 56.2 02/25/2021   VD25OH 94.9 11/18/2020   VD25OH 57.7 06/25/2020   Lab Results  Component Value Date   WBC 8.8 12/19/2019   HGB 12.8 12/19/2019   HCT 38.5 12/19/2019   MCV 92.3 12/19/2019   PLT 245 12/19/2019   Lab Results  Component Value Date   FERRITIN 31.7 10/20/2014    Attestation Statements:   Reviewed by clinician on day of visit: allergies, medications, problem list, medical history, surgical history, family history, social history, and previous encounter notes.  Coral Ceo, CMA, am acting as transcriptionist for [employer], DO.  I have reviewed the above documentation for accuracy and completeness, and I agree with the above. [name], D.O.  The [place] was signed into law in 2016 which includes the topic of electronic health records.  This provides immediate access to information in MyChart.  This includes consultation notes, operative notes, office notes, lab results and pathology reports.  If you have any questions about what you read please let us know at your next visit so we can discuss your concerns and take corrective action if need be.  We are right here with you.

## 2021-05-11 ENCOUNTER — Encounter (INDEPENDENT_AMBULATORY_CARE_PROVIDER_SITE_OTHER): Payer: Self-pay

## 2021-05-11 NOTE — Telephone Encounter (Signed)
Received notification from pt's pharmacy benefits manager stating that Rybelsus 14mg  tablets has been denied due to not having diabetes.

## 2021-05-31 ENCOUNTER — Ambulatory Visit (INDEPENDENT_AMBULATORY_CARE_PROVIDER_SITE_OTHER): Payer: PRIVATE HEALTH INSURANCE | Admitting: Family Medicine

## 2021-05-31 ENCOUNTER — Other Ambulatory Visit: Payer: Self-pay

## 2021-05-31 ENCOUNTER — Encounter (INDEPENDENT_AMBULATORY_CARE_PROVIDER_SITE_OTHER): Payer: Self-pay | Admitting: Family Medicine

## 2021-05-31 VITALS — BP 108/70 | HR 99 | Temp 98.2°F | Ht 60.0 in | Wt 136.0 lb

## 2021-05-31 DIAGNOSIS — E669 Obesity, unspecified: Secondary | ICD-10-CM

## 2021-05-31 DIAGNOSIS — Z9189 Other specified personal risk factors, not elsewhere classified: Secondary | ICD-10-CM | POA: Diagnosis not present

## 2021-05-31 DIAGNOSIS — Z6826 Body mass index (BMI) 26.0-26.9, adult: Secondary | ICD-10-CM

## 2021-05-31 DIAGNOSIS — E8881 Metabolic syndrome: Secondary | ICD-10-CM

## 2021-05-31 DIAGNOSIS — R632 Polyphagia: Secondary | ICD-10-CM | POA: Diagnosis not present

## 2021-05-31 DIAGNOSIS — E559 Vitamin D deficiency, unspecified: Secondary | ICD-10-CM | POA: Diagnosis not present

## 2021-05-31 MED ORDER — METFORMIN HCL 500 MG PO TABS: ORAL_TABLET | ORAL | 0 refills | Status: DC

## 2021-05-31 MED ORDER — VITAMIN D (ERGOCALCIFEROL) 1.25 MG (50000 UNIT) PO CAPS: ORAL_CAPSULE | ORAL | 0 refills | Status: DC

## 2021-05-31 MED ORDER — SEMAGLUTIDE-WEIGHT MANAGEMENT 0.25 MG/0.5ML ~~LOC~~ SOAJ
0.2500 mg | SUBCUTANEOUS | 0 refills | Status: AC
Start: 2021-05-31 — End: 2021-06-30

## 2021-06-01 NOTE — Progress Notes (Signed)
Chief Complaint:   OBESITY [name] is here to discuss her progress with her obesity treatment plan along with follow-up of her obesity related diagnoses. [name] is on the Category 2 Plan and the Category 3 Plan and states she is following her eating plan approximately 50% of the time. [name] states she is on the elliptical for 45 minutes 3 times per week.  Today's visit was #: 23 Starting weight: 171 lbs Starting date: 01/23/2020 Today's weight: 136 lbs Today's date: 05/31/2021 Total lbs lost to date: 35 Total lbs lost since last in-office visit: 0  Interim History: [name] is exercising more than usual lately, and she is upset that she gained weight. She gained 2 lbs in muscle mass. She has much more hunger than usual.  Subjective:   1. Insulin resistance [name]'s insurance wouldn't cover Rybelsus and she has been off for 1 month now. She notes increased hunger and cravings. She denies emotional eating.  2. Vitamin D deficiency [name] is currently taking prescription vitamin D 50,000 IU every 10 days. She denies nausea, vomiting or muscle weakness.  3. Polyphagia [name] discontinued Rybelsus as her insurance wouldn't cover it.   4. At risk for anxiety [name] is at risk of developing anxiety due to stress, personal and or family history or current situation.  Assessment/Plan:   1. Insulin resistance [name] agreed to start metformin 500 mg daily with no refills. She will continue to work on weight loss, exercise, and decreasing simple carbohydrates to help decrease the risk of diabetes. [name] agreed to follow-up with [place] as directed to closely monitor her progress.  - Semaglutide-Weight Management 0.25 MG/0.5ML SOAJ; Inject 0.25 mg into the skin once a week.  Dispense: 2 mL; Refill: 0 - metFORMIN (GLUCOPHAGE) 500 MG tablet; 1 po with lunch daily  Dispense: 30 tablet; Refill: 0  2. Vitamin D deficiency We will refill prescription Vitamin D for 1  month. [name] will follow-up for routine testing of Vitamin D, at least 2-3 times per year to avoid over-replacement.  - Vitamin D, Ergocalciferol, (DRISDOL) 1.25 MG (50000 UNIT) CAPS capsule; 1 po q 10 days  Dispense: 3 capsule; Refill: 0  3. Polyphagia Intensive lifestyle modifications are the first line treatment for this issue. We discussed several lifestyle modifications today. [name] agreed to start Wegovy 0.25 mg weekly with no refills. Risks and benefits were discussed with the patient. I recommended that she call her insurance and see about coverage as well. We will start the prior authorization as well. She will continue to work on diet, exercise and weight loss efforts. Orders and follow up as documented in patient record.  Counseling Polyphagia is excessive hunger. Causes can include: low blood sugars, hypERthyroidism, PMS, lack of sleep, stress, insulin resistance, diabetes, certain medications, and diets that are deficient in protein and fiber.   - Semaglutide-Weight Management 0.25 MG/0.5ML SOAJ; Inject 0.25 mg into the skin once a week.  Dispense: 2 mL; Refill: 0 - metFORMIN (GLUCOPHAGE) 500 MG tablet; 1 po with lunch daily  Dispense: 30 tablet; Refill: 0  4. At risk for anxiety [name] was given approximately 9 minutes of anxiety risk counseling today. She has risk factors for anxiety including stress. We discussed the importance of a healthy work life balance, a healthy relationship with food and a good support system.  Repetitive spaced learning was employed today to elicit superior memory formation and behavioral change.  5. Obesity with current BMI of 26.7 [name] is currently in the action stage of  change. As such, her goal is to continue with weight loss efforts. She has agreed to the Category 3 Plan.   Continue to exercise and increase protein.  Exercise goals: As is.  Behavioral modification strategies: increasing lean protein intake, decreasing simple  carbohydrates, and planning for success.  [name] has agreed to follow-up with our clinic in 4 weeks per her preference. She was informed of the importance of frequent follow-up visits to maximize her success with intensive lifestyle modifications for her multiple health conditions.   Objective:   Blood pressure 108/70, pulse 99, temperature 98.2 F (36.8 C), height 5' (1.524 m), weight 136 lb (61.7 kg), SpO2 100 %. Body mass index is 26.56 kg/m.  General: Cooperative, alert, well developed, in no acute distress. HEENT: Conjunctivae and lids unremarkable. Cardiovascular: Regular rhythm.  Lungs: Normal work of breathing. Neurologic: No focal deficits.   Lab Results  Component Value Date   CREATININE 0.87 12/19/2019   BUN 16 12/19/2019   NA 140 12/19/2019   K 3.9 12/19/2019   CL 105 12/19/2019   CO2 28 12/19/2019   Lab Results  Component Value Date   ALT 16 12/19/2019   AST 19 12/19/2019   ALKPHOS 76 01/22/2016   BILITOT 0.3 12/19/2019   Lab Results  Component Value Date   HGBA1C 5.3 12/19/2019   Lab Results  Component Value Date   INSULIN 8.6 02/25/2021   INSULIN 8.1 06/25/2020   INSULIN 8.5 01/23/2020   Lab Results  Component Value Date   TSH 3.52 12/19/2019   Lab Results  Component Value Date   CHOL 213 (H) 02/25/2021   HDL 69 02/25/2021   LDLCALC 132 (H) 02/25/2021   TRIG 65 02/25/2021   CHOLHDL 3.2 06/25/2020   Lab Results  Component Value Date   VD25OH 56.2 02/25/2021   VD25OH 94.9 11/18/2020   VD25OH 57.7 06/25/2020   Lab Results  Component Value Date   WBC 8.8 12/19/2019   HGB 12.8 12/19/2019   HCT 38.5 12/19/2019   MCV 92.3 12/19/2019   PLT 245 12/19/2019   Lab Results  Component Value Date   FERRITIN 31.7 10/20/2014   Attestation Statements:   Reviewed by clinician on day of visit: allergies, medications, problem list, medical history, surgical history, family history, social history, and previous encounter notes.  Wilhemena Durie, am acting as transcriptionist for [employer], DO.  I have reviewed the above documentation for accuracy and completeness, and I agree with the above. [name], D.O.  The [place] was signed into law in 2016 which includes the topic of electronic health records.  This provides immediate access to information in MyChart.  This includes consultation notes, operative notes, office notes, lab results and pathology reports.  If you have any questions about what you read please let us know at your next visit so we can discuss your concerns and take corrective action if need be.  We are right here with you.

## 2021-06-07 ENCOUNTER — Encounter (INDEPENDENT_AMBULATORY_CARE_PROVIDER_SITE_OTHER): Payer: Self-pay

## 2021-06-28 ENCOUNTER — Ambulatory Visit (INDEPENDENT_AMBULATORY_CARE_PROVIDER_SITE_OTHER): Payer: PRIVATE HEALTH INSURANCE | Admitting: Family Medicine

## 2021-07-07 ENCOUNTER — Encounter (INDEPENDENT_AMBULATORY_CARE_PROVIDER_SITE_OTHER): Payer: Self-pay | Admitting: Nurse Practitioner

## 2021-07-07 ENCOUNTER — Other Ambulatory Visit: Payer: Self-pay

## 2021-07-07 ENCOUNTER — Ambulatory Visit (INDEPENDENT_AMBULATORY_CARE_PROVIDER_SITE_OTHER): Payer: PRIVATE HEALTH INSURANCE | Admitting: Nurse Practitioner

## 2021-07-07 VITALS — BP 111/72 | HR 90 | Temp 98.4°F | Ht 60.0 in | Wt 134.0 lb

## 2021-07-07 DIAGNOSIS — R632 Polyphagia: Secondary | ICD-10-CM | POA: Diagnosis not present

## 2021-07-07 DIAGNOSIS — E559 Vitamin D deficiency, unspecified: Secondary | ICD-10-CM | POA: Diagnosis not present

## 2021-07-07 DIAGNOSIS — Z9189 Other specified personal risk factors, not elsewhere classified: Secondary | ICD-10-CM

## 2021-07-07 DIAGNOSIS — E8881 Metabolic syndrome: Secondary | ICD-10-CM

## 2021-07-07 DIAGNOSIS — E669 Obesity, unspecified: Secondary | ICD-10-CM

## 2021-07-07 MED ORDER — WEGOVY 0.25 MG/0.5ML ~~LOC~~ SOAJ: 0.2500 mg | SUBCUTANEOUS | 0 refills | Status: DC

## 2021-07-07 MED ORDER — VITAMIN D (ERGOCALCIFEROL) 1.25 MG (50000 UNIT) PO CAPS: ORAL_CAPSULE | ORAL | 0 refills | Status: DC

## 2021-07-07 MED ORDER — METFORMIN HCL 500 MG PO TABS: ORAL_TABLET | ORAL | 0 refills | Status: DC

## 2021-07-07 NOTE — Progress Notes (Signed)
? ? ? ?Chief Complaint:  ? ?OBESITY ?[name] is here to discuss her progress with her obesity treatment plan along with follow-up of her obesity related diagnoses. [name] is on the Category 3 Plan and states she is following her eating plan approximately 50% of the time. [name] states she is using the elliptical for 40 minutes 3 times per week. ? ?Today's visit was #: 24 ?Starting weight: 171 lbs ?Starting date: 01/23/2020 ?Today's weight: 134 lbs ?Today's date: 07/06/2021 ?Total lbs lost to date: 37 lbs ?Total lbs lost since last in-office visit: 2 lbs ? ?Interim History: [name] has overall done well with weight loss. She is meeting all her calories, protein and water goals. She has celebrated 3 birthdays since her last visit. She went to the emergency department on 06/27/2021 due to food poisoning. She denies hunger or cravings.  ? ?Subjective:  ? ?1. Insulin resistance ?[name] is currently taking Metformin 500 mg and Wegovy 0.25 mg. She is doing well. She denies side effects. She feels that hunger and cravings are under control since starting [name].  ? ?2. Polyphagia ?[name] is taking Metformin 500 mg and Wegovy 0.25 mg. She denies side effects. She feels hunger is controlled on both.  ? ?3. Vitamin D deficiency ?[name] is doing well on Vitamin D 50,000 IU every 10 days. She denies side effects.  ? ?4. At risk for dehydration ?[name] is at risk for dehydration due to recent hospitalization for food poisoning.  ? ?Assessment/Plan:  ? ?1. Insulin resistance ?We will refill Wegovy 0.25 mg and Metformin 500 mg for 1 month with 1 refill. We discussed side effects and labs today. She will continue working on dietary changes and exercises. [name] will continue to work on weight loss, exercise, and decreasing simple carbohydrates to help decrease the risk of diabetes. [name] agreed to follow-up with [place] as directed to closely monitor her progress. ? ?- metFORMIN (GLUCOPHAGE) 500 MG tablet; 1 po  with lunch daily  Dispense: 30 tablet; Refill: 0 ?- Semaglutide-Weight Management (WEGOVY) 0.25 MG/0.5ML SOAJ; Inject 0.25 mg into the skin once a week.  Dispense: 2 mL; Refill: 0 ? ?2. Polyphagia ?Intensive lifestyle modifications are the first line treatment for this issue. We will refill Wegovy 0.25 mg and Metformin 500 mg for 1 month with 1 refill. We discussed side effects and labs today. She will continue working on dietary changes and exercise. We discussed several lifestyle modifications today and she will continue to work on diet, exercise and weight loss efforts. Orders and follow up as documented in patient record. ? ?Counseling ?Polyphagia is excessive hunger. ?Causes can include: low blood sugars, hypERthyroidism, PMS, lack of sleep, stress, insulin resistance, diabetes, certain medications, and diets that are deficient in protein and fiber.   ? ?- metFORMIN (GLUCOPHAGE) 500 MG tablet; 1 po with lunch daily  Dispense: 30 tablet; Refill: 0 ?- Semaglutide-Weight Management (WEGOVY) 0.25 MG/0.5ML SOAJ; Inject 0.25 mg into the skin once a week.  Dispense: 2 mL; Refill: 0 ? ?3. Vitamin D deficiency ?Low Vitamin D level contributes to fatigue and are associated with obesity, breast, and colon cancer. We will refill prescription Vitamin D 50,000 IU every 10 days for 1 month with no refills and [name] will follow-up for routine testing of Vitamin D, at least 2-3 times per year to avoid over-replacement. ? ?- Vitamin D, Ergocalciferol, (DRISDOL) 1.25 MG (50000 UNIT) CAPS capsule; 1 po q 10 days  Dispense: 3 capsule; Refill: 0 ? ?4. At risk for dehydration ?[name]  was given approximately 15 minutes of dehydration prevention counseling today. [name] is at risk for dehydration due to weight loss and current medication(s). She was encouraged to hydrate and monitor fluid status to avoid dehydration as weight loss plateaus.  ? ?5. obesity, current BMI 26.2 ?[name] is currently in the action stage of  change. As such, her goal is to continue with weight loss efforts. She has agreed to the Category 3 Plan.  ? ?Exercise goals:  As is. ? ?Behavioral modification strategies: increasing lean protein intake, increasing water intake, no skipping meals, meal planning and cooking strategies, and planning for success. ? ?[name] has agreed to follow-up with our clinic in 4 weeks. She was informed of the importance of frequent follow-up visits to maximize her success with intensive lifestyle modifications for her multiple health conditions.  ? ?Objective:  ? ?Blood pressure 111/72, pulse 90, temperature 98.4 ?F (36.9 ?C), height 5' (1.524 m), weight 134 lb (60.8 kg), SpO2 100 %. ?Body mass index is 26.17 kg/m?. ? ?General: Cooperative, alert, well developed, in no acute distress. ?HEENT: Conjunctivae and lids unremarkable. ?Cardiovascular: Regular rhythm.  ?Lungs: Normal work of breathing. ?Neurologic: No focal deficits.  ? ?Lab Results  ?Component Value Date  ? CREATININE 0.87 12/19/2019  ? BUN 16 12/19/2019  ? NA 140 12/19/2019  ? K 3.9 12/19/2019  ? CL 105 12/19/2019  ? CO2 28 12/19/2019  ? ?Lab Results  ?Component Value Date  ? ALT 16 12/19/2019  ? AST 19 12/19/2019  ? ALKPHOS 76 01/22/2016  ? BILITOT 0.3 12/19/2019  ? ?Lab Results  ?Component Value Date  ? HGBA1C 5.3 12/19/2019  ? ?Lab Results  ?Component Value Date  ? INSULIN 8.6 02/25/2021  ? INSULIN 8.1 06/25/2020  ? INSULIN 8.5 01/23/2020  ? ?Lab Results  ?Component Value Date  ? TSH 3.52 12/19/2019  ? ?Lab Results  ?Component Value Date  ? CHOL 213 (H) 02/25/2021  ? HDL 69 02/25/2021  ? LDLCALC 132 (H) 02/25/2021  ? TRIG 65 02/25/2021  ? CHOLHDL 3.2 06/25/2020  ? ?Lab Results  ?Component Value Date  ? VD25OH 56.2 02/25/2021  ? VD25OH 94.9 11/18/2020  ? VD25OH 57.7 06/25/2020  ? ?Lab Results  ?Component Value Date  ? WBC 8.8 12/19/2019  ? HGB 12.8 12/19/2019  ? HCT 38.5 12/19/2019  ? MCV 92.3 12/19/2019  ? PLT 245 12/19/2019  ? ?Lab Results  ?Component Value Date   ? FERRITIN 31.7 10/20/2014  ? ?Attestation Statements:  ? ?Reviewed by clinician on day of visit: allergies, medications, problem list, medical history, surgical history, family history, social history, and previous encounter notes. ? ?I, Lizbeth Bark, RMA, am acting as transcriptionist for Everardo Pacific, FNP ? ?I have reviewed the above documentation for accuracy and completeness, and I agree with the above. Everardo Pacific, FNP  ?

## 2021-07-29 ENCOUNTER — Encounter (HOSPITAL_COMMUNITY): Payer: Self-pay | Admitting: Psychiatry

## 2021-07-29 ENCOUNTER — Telehealth (HOSPITAL_BASED_OUTPATIENT_CLINIC_OR_DEPARTMENT_OTHER): Payer: No Typology Code available for payment source | Admitting: Psychiatry

## 2021-07-29 DIAGNOSIS — G4700 Insomnia, unspecified: Secondary | ICD-10-CM | POA: Diagnosis not present

## 2021-07-29 DIAGNOSIS — F900 Attention-deficit hyperactivity disorder, predominantly inattentive type: Secondary | ICD-10-CM | POA: Diagnosis not present

## 2021-07-29 DIAGNOSIS — F411 Generalized anxiety disorder: Secondary | ICD-10-CM

## 2021-07-29 DIAGNOSIS — F331 Major depressive disorder, recurrent, moderate: Secondary | ICD-10-CM | POA: Diagnosis not present

## 2021-07-29 MED ORDER — LORAZEPAM 1 MG PO TABS: ORAL_TABLET | Freq: Every day | ORAL | 2 refills | Status: DC | PRN

## 2021-07-29 MED ORDER — BUSPIRONE HCL 30 MG PO TABS: 30.0000 mg | ORAL_TABLET | Freq: Two times a day (BID) | ORAL | 0 refills | Status: DC

## 2021-07-29 MED ORDER — ARIPIPRAZOLE 10 MG PO TABS: 10.0000 mg | ORAL_TABLET | Freq: Every day | ORAL | 0 refills | Status: DC

## 2021-07-29 MED ORDER — BUPROPION HCL ER (XL) 300 MG PO TB24: 300.0000 mg | ORAL_TABLET | ORAL | 0 refills | Status: DC

## 2021-07-29 MED ORDER — ATOMOXETINE HCL 40 MG PO CAPS: 40.0000 mg | ORAL_CAPSULE | Freq: Every day | ORAL | 0 refills | Status: DC

## 2021-07-29 NOTE — Progress Notes (Signed)
Virtual Visit via Video Note ? ?I connected with [name] on 07/29/21 at  1:00 PM EDT by a video enabled telemedicine application and verified that I am speaking with the correct person using two identifiers. ? ?Location: ?Patient: home ?Provider: office ?  ?I discussed the limitations of evaluation and management by telemedicine and the availability of in person appointments. The patient expressed understanding and agreed to proceed. ? ?History of Present Illness: ?[name] shares she has been doing well. She is busy and active. Home and work are going well. She is sleeping and eating well. Her energy is good. In the month of March she was depressed for 5 random days. Since the start of April she has 2 bad days. On those days she is having a lot negative thoughts, no energy or motivation to do anything. She ends up spending the day in bed. This can be random or triggered. The next day she feels better. [name] denies SI/HI. She gets anxiety on/off thru out the week. She has racing thoughts, inability to relax and is internally focused. She gets irritable and is emotionally sensitive. It can lead to poor sleep. Things that help are going to the gym or taking Ativan or distracting herself. She takes Ativan 2x/week. Her ADHD is well controlled.  ?  ?Observations/Objective: ?Psychiatric Specialty Exam: ?ROS  ?There were no vitals taken for this visit.There is no height or weight on file to calculate BMI.  ?General Appearance: Neat and Well Groomed  ?Eye Contact:  Good  ?Speech:  Clear and Coherent and Normal Rate  ?Volume:  Normal  ?Mood:  Anxious  ?Affect:  Full Range  ?Thought Process:  Goal Directed, Linear, and Descriptions of Associations: Intact  ?Orientation:  Full (Time, Place, and Person)  ?Thought Content:  Logical  ?Suicidal Thoughts:  No  ?Homicidal Thoughts:  No  ?Memory:  Immediate;   Good  ?Judgement:  Good  ?Insight:  Good  ?Psychomotor Activity:  Normal  ?Concentration:  Concentration: Good   ?Recall:  Good  ?Fund of Knowledge:  Good  ?Language:  Good  ?Akathisia:  No  ?Handed:  Right  ?AIMS (if indicated):     ?Assets:  Communication Skills ?Desire for Improvement ?Financial Resources/Insurance ?Housing ?Intimacy ?Leisure Time ?Resilience ?Social Support ?Talents/Skills ?Transportation ?Vocational/Educational  ?ADL's:  Intact  ?Cognition:  WNL  ?Sleep:     ? ? ? ?Assessment and Plan: ? ? ?  07/29/2021  ?  1:08 PM 05/06/2021  ? 11:32 AM 04/20/2021  ?  2:58 PM 03/11/2021  ?  1:12 PM 11/19/2020  ?  9:14 AM  ?Depression screen PHQ 2/9  ?Decreased Interest 0 1 0 0 1  ?Down, Depressed, Hopeless 1 1 0 1 1  ?PHQ - 2 Score 1 2 0 1 2  ?Altered sleeping  1 1  1   ?Tired, decreased energy  1 1  1   ?Change in appetite  0 0  0  ?Feeling bad or failure about yourself   0 0  1  ?Trouble concentrating  0 0  0  ?Moving slowly or fidgety/restless  0 0  1  ?Suicidal thoughts  0 0  0  ?PHQ-9 Score  4 2  6   ?Difficult doing work/chores  Somewhat difficult   Somewhat difficult  ? ? ?Flowsheet Row Video Visit from 07/29/2021 in [facility] Video Visit from 05/06/2021 in [facility] Video Visit from 03/11/2021 in [facility]  ?C-SSRS RISK CATEGORY  No Risk No Risk No Risk  ? ?  ? ? ? ?The risk of un-intended pregnancy is low based on the fact that pt reports OCP. Pt is aware that these meds carry a teratogenic risk. Pt will discuss plan of action if she does or plans to become pregnant in the future. ? ?Status of current problems: ongoing anxiety ? ?Meds:  ?1. GAD (generalized anxiety disorder) ?- busPIRone (BUSPAR) 30 MG tablet; Take 1 tablet (30 mg total) by mouth 2 (two) times daily.  Dispense: 180 tablet; Refill: 0 ?- LORazepam (ATIVAN) 1 MG tablet; TAKE 1 TABLET BY MOUTH ONCE A DAY AS NEEDED FOR ANXIETY  Dispense: 30 tablet; Refill: 2 ? ?2. Major depressive disorder, recurrent episode, moderate (HCC) ?-  ARIPiprazole (ABILIFY) 10 MG tablet; Take 1 tablet (10 mg total) by mouth daily.  Dispense: 90 tablet; Refill: 0 ?- buPROPion (WELLBUTRIN XL) 300 MG 24 hr tablet; Take 1 tablet (300 mg total) by mouth every morning.  Dispense: 90 tablet; Refill: 0 ?- busPIRone (BUSPAR) 30 MG tablet; Take 1 tablet (30 mg total) by mouth 2 (two) times daily.  Dispense: 180 tablet; Refill: 0 ? ?3. Attention deficit hyperactivity disorder (ADHD), predominantly inattentive type ?- atomoxetine (STRATTERA) 40 MG capsule; Take 1 capsule (40 mg total) by mouth daily.  Dispense: 90 capsule; Refill: 0 ?- buPROPion (WELLBUTRIN XL) 300 MG 24 hr tablet; Take 1 tablet (300 mg total) by mouth every morning.  Dispense: 90 tablet; Refill: 0 ? ?4. Insomnia, unspecified type ?- busPIRone (BUSPAR) 30 MG tablet; Take 1 tablet (30 mg total) by mouth 2 (two) times daily.  Dispense: 180 tablet; Refill: 0 ?- LORazepam (ATIVAN) 1 MG tablet; TAKE 1 TABLET BY MOUTH ONCE A DAY AS NEEDED FOR ANXIETY  Dispense: 30 tablet; Refill: 2 ? ? ? ?Reviewed labs done 06/28/2021: done when she was hospitalized at [facility] with stomach problems ?Na 135 ?K 3.3 ?Hb 11.1 ?Hct 32.4 ?HbA1c 4.7 ? ? ? ? EKG on 06/27/2021 shows QTc 450 and sinus tachycardia ? ? ?Therapy: brief supportive therapy provided.  ? ? ? ?Collaboration of Care: Other none today ? ?Patient/Guardian was advised Release of Information must be obtained prior to any record release in order to collaborate their care with an outside provider. Patient/Guardian was advised if they have not already done so to contact the registration department to sign all necessary forms in order for [place] to release information regarding their care.  ? ?Consent: Patient/Guardian gives verbal consent for treatment and assignment of benefits for services provided during this visit. Patient/Guardian expressed understanding and agreed to proceed.  ? ? ? ? ?Follow Up Instructions: ?Follow up in 2-3 months or sooner if needed ?  ? ?I discussed the  assessment and treatment plan with the patient. The patient was provided an opportunity to ask questions and all were answered. The patient agreed with the plan and demonstrated an understanding of the instructions. ?  ?The patient was advised to call back or seek an in-person evaluation if the symptoms worsen or if the condition fails to improve as anticipated. ? ?I provided 15 minutes of non-face-to-face time during this encounter. ? ? ?Oletta Darter, MD ? ?

## 2021-08-02 ENCOUNTER — Ambulatory Visit (INDEPENDENT_AMBULATORY_CARE_PROVIDER_SITE_OTHER): Payer: PRIVATE HEALTH INSURANCE | Admitting: Family Medicine

## 2021-08-02 ENCOUNTER — Encounter (INDEPENDENT_AMBULATORY_CARE_PROVIDER_SITE_OTHER): Payer: Self-pay | Admitting: Family Medicine

## 2021-08-02 VITALS — BP 97/67 | HR 101 | Temp 97.6°F | Ht 60.0 in | Wt 134.0 lb

## 2021-08-02 DIAGNOSIS — E669 Obesity, unspecified: Secondary | ICD-10-CM | POA: Diagnosis not present

## 2021-08-02 DIAGNOSIS — E7849 Other hyperlipidemia: Secondary | ICD-10-CM | POA: Diagnosis not present

## 2021-08-02 DIAGNOSIS — E559 Vitamin D deficiency, unspecified: Secondary | ICD-10-CM

## 2021-08-02 DIAGNOSIS — Z9189 Other specified personal risk factors, not elsewhere classified: Secondary | ICD-10-CM

## 2021-08-02 DIAGNOSIS — E8881 Metabolic syndrome: Secondary | ICD-10-CM | POA: Diagnosis not present

## 2021-08-02 DIAGNOSIS — Z6826 Body mass index (BMI) 26.0-26.9, adult: Secondary | ICD-10-CM

## 2021-08-02 DIAGNOSIS — E785 Hyperlipidemia, unspecified: Secondary | ICD-10-CM

## 2021-08-02 MED ORDER — SEMAGLUTIDE-WEIGHT MANAGEMENT 0.5 MG/0.5ML ~~LOC~~ SOAJ: 0.5000 mg | SUBCUTANEOUS | 0 refills | Status: DC

## 2021-08-02 MED ORDER — VITAMIN D (ERGOCALCIFEROL) 1.25 MG (50000 UNIT) PO CAPS: ORAL_CAPSULE | ORAL | 0 refills | Status: DC

## 2021-08-02 MED ORDER — METFORMIN HCL 500 MG PO TABS: ORAL_TABLET | ORAL | 0 refills | Status: DC

## 2021-08-03 LAB — VITAMIN D 25 HYDROXY (VIT D DEFICIENCY, FRACTURES): Vit D, 25-Hydroxy: 56.6 ng/mL (ref 30.0–100.0)

## 2021-08-04 NOTE — Progress Notes (Signed)
? ? ? ?Chief Complaint:  ? ?OBESITY ?[name] is here to discuss her progress with her obesity treatment plan along with follow-up of her obesity related diagnoses. [name] is on the Category 3 Plan and states she is following her eating plan approximately 60% of the time. [name] states she is on the elliptical for 40 minutes 3 times per week. ? ?Today's visit was #: 25 ?Starting weight: 171 lbs ?Starting date: 01/23/2020 ?Today's weight: 134 lbs ?Today's date: 08/02/2021 ?Total lbs lost to date: 27 ?Total lbs lost since last in-office visit: 0 ? ?Interim History: [name] gained 1 lb muscle and lost 1 lb in fat fast mass. She is focusing on increasing protein and exercise. No issues with meal plan. She is drinking 60-70 oz of water per day on average.  ? ?Subjective:  ? ?1. Insulin resistance with polyphagia ?[name] still craves for carbohydrates despite eating on the plan. She denies emotional eating.  ? ?2. Vitamin D deficiency ?She is currently taking prescription vitamin D 50,000 IU every 10 days. She denies nausea, vomiting or muscle weakness. ? ?3. Other hyperlipidemia ?[name]'s last check was 5 months ago. Her LDL is elevated but HDL 60+. She is not on medications.  ? ?4. At risk for dehydration ?[name] is at risk for dehydration due to inadequate water intake. ? ?Assessment/Plan:  ? ?Orders Placed This Encounter  ?Procedures  ? VITAMIN D 25 Hydroxy (Vit-D Deficiency, Fractures)  ? ? ?Medications Discontinued During This Encounter  ?Medication Reason  ? Semaglutide-Weight Management (WEGOVY) 0.25 MG/0.5ML SOAJ   ? Vitamin D, Ergocalciferol, (DRISDOL) 1.25 MG (50000 UNIT) CAPS capsule Reorder  ? metFORMIN (GLUCOPHAGE) 500 MG tablet Reorder  ?  ? ?Meds ordered this encounter  ?Medications  ? Vitamin D, Ergocalciferol, (DRISDOL) 1.25 MG (50000 UNIT) CAPS capsule  ?  Sig: 1 po q 10 days  ?  Dispense:  3 capsule  ?  Refill:  0  ?  30 d supply;  ov for RF  ? Semaglutide-Weight Management 0.5  MG/0.5ML SOAJ  ?  Sig: Inject 0.5 mg into the skin once a week.  ?  Dispense:  2 mL  ?  Refill:  0  ? metFORMIN (GLUCOPHAGE) 500 MG tablet  ?  Sig: 1 po with lunch daily  ?  Dispense:  30 tablet  ?  Refill:  0  ?  30 d supply;  ** OV for RF **   Do not send RF request  ?  ? ?1. Insulin resistance with polyphagia ?Marrion agreed to increase Wegovy from 0.25 mg to 0.5 mg weekly. She will continue metformin for carbohydrate cravings, and we will refill for 1 month. She is to increase protein and fiber, and decrease simple carbohydrates.  ? ?- Semaglutide-Weight Management 0.5 MG/0.5ML SOAJ; Inject 0.5 mg into the skin once a week.  Dispense: 2 mL; Refill: 0 ?- metFORMIN (GLUCOPHAGE) 500 MG tablet; 1 po with lunch daily  Dispense: 30 tablet; Refill: 0 ? ?2. Vitamin D deficiency ?We will check labs today, and we will refill prescription Vitamin D for 1 month. Wilmer will follow-up for routine testing of Vitamin D, at least 2-3 times per year to avoid over-replacement. ? ?- Vitamin D, Ergocalciferol, (DRISDOL) 1.25 MG (50000 UNIT) CAPS capsule; 1 po q 10 days  Dispense: 3 capsule; Refill: 0 ?- VITAMIN D 25 Hydroxy (Vit-D Deficiency, Fractures) ? ?3. Other hyperlipidemia ?Sinda will continue her prudent nutritional plan and exercise. Her cholesterol has been stable with minimal change on the  meal plan and increasing exercise.  ? ?4. At risk for dehydration ?[name] is at higher than average risk of dehydration.  [name] was given more than 9 minutes of proper hydration counseling today.  We discussed the signs and symptoms of dehydration, some of which may include muscle cramping, constipation or even orthostatic symptoms.  Counseling on the prevention of dehydration was also provided today.  [name] is at risk for dehydration due to weight loss, lifestyle and behavorial habits and possibly due to taking certain medication(s).  She was encouraged to adequately hydrate and monitor fluid status to avoid  dehydration as well as weight loss plateaus.  Unless pre-existing renal or cardiopulmonary conditions exist, in which patient was told to limit their fluid intake, I recommended roughly one half of their weight in pounds to be the approximate ounces of non-caloric, non-caffeinated beverages they should drink per day; including more if they are engaging in exercise. ? ?[name] is at higher than average risk of dehydration.  [name] was given more than 9 minutes of proper hydration counseling today.  We discussed the signs and symptoms of dehydration, some of which may include muscle cramping, constipation, or even orthostatic symptoms.  Counseling on the prevention of dehydration was also provided today.  [name] is at risk for dehydration due to weight loss, lifestyle and behavorial habits, and possibly due to taking certain medication(s).  She was encouraged to adequately hydrate and monitor fluid status to avoid dehydration as well as weight loss plateaus.  Unless pre-existing renal or cardiopulmonary conditions exist, which pt was told to limit their fluid intake.  I recommended roughly one half of their weight in pounds to be the approximate ounces of non-caloric, non-caffeinated beverages they should drink per day; including more if they are engaging in exercise. ? ?5. Obesity, current BMI 26.2 ?[name] is currently in the action stage of change. As such, her goal is to continue with weight loss efforts. She has agreed to the Category 3 Plan.  ? ?[name] is to add 2-3 days per week of resistance training.  ? ?Exercise goals: As is.  ? ?Behavioral modification strategies: increasing lean protein intake, increasing water intake, emotional eating strategies, and planning for success. ? ?[name] has agreed to follow-up with our clinic in 4 weeks. She was informed of the importance of frequent follow-up visits to maximize her success with intensive lifestyle modifications for her multiple health  conditions.  ? ?Objective:  ? ?Blood pressure 97/67, pulse (!) 101, temperature 97.6 ?F (36.4 ?C), height 5' (1.524 m), weight 134 lb (60.8 kg), SpO2 100 %. ?Body mass index is 26.17 kg/m?. ? ?General: Cooperative, alert, well developed, in no acute distress. ?HEENT: Conjunctivae and lids unremarkable. ?Cardiovascular: Regular rhythm.  ?Lungs: Normal work of breathing. ?Neurologic: No focal deficits.  ? ?Lab Results  ?Component Value Date  ? CREATININE 0.87 12/19/2019  ? BUN 16 12/19/2019  ? NA 140 12/19/2019  ? K 3.9 12/19/2019  ? CL 105 12/19/2019  ? CO2 28 12/19/2019  ? ?Lab Results  ?Component Value Date  ? ALT 16 12/19/2019  ? AST 19 12/19/2019  ? ALKPHOS 76 01/22/2016  ? BILITOT 0.3 12/19/2019  ? ?Lab Results  ?Component Value Date  ? HGBA1C 5.3 12/19/2019  ? ?Lab Results  ?Component Value Date  ? INSULIN 8.6 02/25/2021  ? INSULIN 8.1 06/25/2020  ? INSULIN 8.5 01/23/2020  ? ?Lab Results  ?Component Value Date  ? TSH 3.52 12/19/2019  ? ?Lab Results  ?Component Value Date  ?  CHOL 213 (H) 02/25/2021  ? HDL 69 02/25/2021  ? LDLCALC 132 (H) 02/25/2021  ? TRIG 65 02/25/2021  ? CHOLHDL 3.2 06/25/2020  ? ?Lab Results  ?Component Value Date  ? VD25OH 56.6 08/02/2021  ? VD25OH 56.2 02/25/2021  ? VD25OH 94.9 11/18/2020  ? ?Lab Results  ?Component Value Date  ? WBC 8.8 12/19/2019  ? HGB 12.8 12/19/2019  ? HCT 38.5 12/19/2019  ? MCV 92.3 12/19/2019  ? PLT 245 12/19/2019  ? ?Lab Results  ?Component Value Date  ? FERRITIN 31.7 10/20/2014  ? ?Attestation Statements:  ? ?Reviewed by clinician on day of visit: allergies, medications, problem list, medical history, surgical history, family history, social history, and previous encounter notes. ? ? ?I, Trixie Dredge, am acting as transcriptionist for [employer], DO. ? ?I have reviewed the above documentation for accuracy and completeness, and I agree with the above. [name], D.O. ? ?The [place] was signed into law in 2016 which includes the topic  of electronic health records.  This provides immediate access to information in MyChart.  This includes consultation notes, operative notes, office notes, lab results and pathology reports.  If you have any questions abou

## 2021-08-31 ENCOUNTER — Other Ambulatory Visit (INDEPENDENT_AMBULATORY_CARE_PROVIDER_SITE_OTHER): Payer: Self-pay | Admitting: Family Medicine

## 2021-08-31 DIAGNOSIS — E8881 Metabolic syndrome: Secondary | ICD-10-CM

## 2021-08-31 DIAGNOSIS — E559 Vitamin D deficiency, unspecified: Secondary | ICD-10-CM

## 2021-09-01 MED ORDER — METFORMIN HCL 500 MG PO TABS: ORAL_TABLET | ORAL | 0 refills | Status: DC

## 2021-09-01 MED ORDER — SEMAGLUTIDE-WEIGHT MANAGEMENT 0.5 MG/0.5ML ~~LOC~~ SOAJ: 0.5000 mg | SUBCUTANEOUS | 0 refills | Status: DC

## 2021-09-01 NOTE — Telephone Encounter (Signed)
LAST APPOINTMENT DATE: 08/02/21 ?NEXT APPOINTMENT DATE: 09/09/21 ? ? ?[facility] - [place], [place] - [address] ?[address] ?[place] [address] ?Phone: [phone] Fax: [phone] ? ?[id number] [place], [place] - [address] ?[address] ?[place]  [address] ?Phone: [phone] Fax: [phone] ? ?Patient is requesting a refill of the following medications: ?Pending Prescriptions:                       Disp   Refills ?  Vitamin D, Ergocalciferol, (DRISDOL) 1.25 *3 caps*0       ?Sig: 1 po q 10 days ?  Semaglutide-Weight Management 0.5 MG/0.5ML*2 mL   0       ?Sig: Inject 0.5 mg into the skin once a week. ?  metFORMIN (GLUCOPHAGE) 500 MG tablet       30 tab*0       ?Sig: 1 po with lunch daily ? ? ?Date last filled: 08/02/21 ?Previously prescribed by Dr. Sharee Holster  ? ?Lab Results ?     Component                Value               Date                 ?     HGBA1C                   5.3                 12/19/2019           ?Lab Results ?     Component                Value               Date                 ?     LDLCALC                  132 (H)             02/25/2021           ?     CREATININE               0.87                12/19/2019           ?Lab Results ?     Component                Value               Date                 ?     VD25OH                   56.6                08/02/2021           ?     VD25OH                   56.2                02/25/2021           ?     VD25OH  94.9                11/18/2020           ? ?BP Readings from Last 3 Encounters: ?08/02/21 : 97/67 ?07/07/21 : 111/72 ?05/31/21 : 108/70 ?

## 2021-09-01 NOTE — Telephone Encounter (Signed)
Patient states she needs a refill on Wegovy, and Metformin. She stated she doesn't have enough to last her to her next OV.

## 2021-09-01 NOTE — Telephone Encounter (Signed)
Patient called back to speak to [facility]. ?

## 2021-09-01 NOTE — Telephone Encounter (Signed)
Dr.Opalski ?

## 2021-09-01 NOTE — Telephone Encounter (Signed)
Left message to return call 

## 2021-09-01 NOTE — Telephone Encounter (Signed)
Thanks for letting me know!

## 2021-09-09 ENCOUNTER — Ambulatory Visit (INDEPENDENT_AMBULATORY_CARE_PROVIDER_SITE_OTHER): Payer: PRIVATE HEALTH INSURANCE | Admitting: Family Medicine

## 2021-09-09 ENCOUNTER — Encounter (INDEPENDENT_AMBULATORY_CARE_PROVIDER_SITE_OTHER): Payer: Self-pay | Admitting: Family Medicine

## 2021-09-09 VITALS — BP 107/71 | HR 72 | Temp 98.0°F | Ht 60.0 in | Wt 136.0 lb

## 2021-09-09 DIAGNOSIS — E559 Vitamin D deficiency, unspecified: Secondary | ICD-10-CM | POA: Diagnosis not present

## 2021-09-09 DIAGNOSIS — Z9189 Other specified personal risk factors, not elsewhere classified: Secondary | ICD-10-CM

## 2021-09-09 DIAGNOSIS — R632 Polyphagia: Secondary | ICD-10-CM | POA: Diagnosis not present

## 2021-09-09 DIAGNOSIS — Z6826 Body mass index (BMI) 26.0-26.9, adult: Secondary | ICD-10-CM

## 2021-09-09 DIAGNOSIS — E669 Obesity, unspecified: Secondary | ICD-10-CM

## 2021-09-09 MED ORDER — SEMAGLUTIDE-WEIGHT MANAGEMENT 0.5 MG/0.5ML ~~LOC~~ SOAJ: 0.5000 mg | SUBCUTANEOUS | 0 refills | Status: DC

## 2021-09-09 MED ORDER — VITAMIN D (ERGOCALCIFEROL) 1.25 MG (50000 UNIT) PO CAPS: ORAL_CAPSULE | ORAL | 0 refills | Status: DC

## 2021-09-20 NOTE — Progress Notes (Unsigned)
Chief Complaint:   OBESITY [name] is here to discuss her progress with her obesity treatment plan along with follow-up of her obesity related diagnoses. [name] is on the Category 3 Plan and states she is following her eating plan approximately 50% of the time. [name] states she is using the elliptical 40 minutes 3-4 times per week.  Today's visit was #: 49 Starting weight: 171 lbs Starting date: 12/27/2019 Today's weight: 136 lbs Today's date: 09/09/2021 Total lbs lost to date: 35 lbs Total lbs lost since last in-office visit: 0 lbs  Interim History: [name]   Subjective:   1. Vitamin D deficiency Discussed labs with [name] and her Vitamin D level is 56 and good.She is currently taking prescription vitamin D 50,000 IU each week. She denies nausea, vomiting or muscle weakness.  2. Polyphagia [name] has good control of her hunger and cravings and is tolerating Wegovy well with no side effects.  3.  At risk for depression [name] is at elevated risk of depression due to one or more of the following: due to current emotions of beating herself up when eating off the plan.  Assessment/Plan:  No orders of the defined types were placed in this encounter.   Medications Discontinued During This Encounter  Medication Reason   Vitamin D, Ergocalciferol, (DRISDOL) 1.25 MG (50000 UNIT) CAPS capsule Reorder   Semaglutide-Weight Management 0.5 MG/0.5ML SOAJ Reorder     Meds ordered this encounter  Medications   Vitamin D, Ergocalciferol, (DRISDOL) 1.25 MG (50000 UNIT) CAPS capsule    Sig: 1 po q 10 days    Dispense:  3 capsule    Refill:  0    30 d supply;  ov for RF   Semaglutide-Weight Management 0.5 MG/0.5ML SOAJ    Sig: Inject 0.5 mg into the skin once a week.    Dispense:  2 mL    Refill:  0     1. Vitamin D deficiency We will refill Vitamin D.  - Vitamin D, Ergocalciferol, (DRISDOL) 1.25 MG (50000 UNIT) CAPS capsule; 1 po q 10 days  Dispense: 3  capsule; Refill: 0  2. Polyphagia We will refill Wegovy with no dosage change.  - Semaglutide-Weight Management 0.5 MG/0.5ML SOAJ; Inject 0.5 mg into the skin once a week.  Dispense: 2 mL; Refill: 0  3. At risk for depression [name] was given approximately 9 minutes of depression risk counseling today. She has risk factors for depression. We discussed the importance of a healthy work life balance, a healthy relationship with food and a good support system.  Repetitive spaced learning was employed today to elicit superior memory formation and behavioral change.  4. Obesity, current BMI 26.7 [name] is currently in the action stage of change. As such, her goal is to continue with weight loss efforts. She has agreed to the Category 3 Plan.   [name]'s goals are: 1) to increase protein intake 2) increase activity outside of the gym.  Exercise goals: As I,but add strength training.  Behavioral modification strategies: increasing lean protein intake, decreasing liquid calories, and planning for success.  [name] has agreed to follow-up with our clinic in 4 weeks. She was informed of the importance of frequent follow-up visits to maximize her success with intensive lifestyle modifications for her multiple health conditions.   Objective:   Blood pressure 107/71, pulse 72, temperature 98 F (36.7 C), height 5' (1.524 m), weight 136 lb (61.7 kg), SpO2 99 %. Body mass index is 26.56 kg/m.  General:  Cooperative, alert, well developed, in no acute distress. HEENT: Conjunctivae and lids unremarkable. Cardiovascular: Regular rhythm.  Lungs: Normal work of breathing. Neurologic: No focal deficits.   Lab Results  Component Value Date   CREATININE 0.87 12/19/2019   BUN 16 12/19/2019   NA 140 12/19/2019   K 3.9 12/19/2019   CL 105 12/19/2019   CO2 28 12/19/2019   Lab Results  Component Value Date   ALT 16 12/19/2019   AST 19 12/19/2019   ALKPHOS 76 01/22/2016   BILITOT 0.3  12/19/2019   Lab Results  Component Value Date   HGBA1C 5.3 12/19/2019   Lab Results  Component Value Date   INSULIN 8.6 02/25/2021   INSULIN 8.1 06/25/2020   INSULIN 8.5 01/23/2020   Lab Results  Component Value Date   TSH 3.52 12/19/2019   Lab Results  Component Value Date   CHOL 213 (H) 02/25/2021   HDL 69 02/25/2021   LDLCALC 132 (H) 02/25/2021   TRIG 65 02/25/2021   CHOLHDL 3.2 06/25/2020   Lab Results  Component Value Date   VD25OH 56.6 08/02/2021   VD25OH 56.2 02/25/2021   VD25OH 94.9 11/18/2020   Lab Results  Component Value Date   WBC 8.8 12/19/2019   HGB 12.8 12/19/2019   HCT 38.5 12/19/2019   MCV 92.3 12/19/2019   PLT 245 12/19/2019   Lab Results  Component Value Date   FERRITIN 31.7 10/20/2014   Attestation Statements:   Reviewed by clinician on day of visit: allergies, medications, problem list, medical history, surgical history, family history, social history, and previous encounter notes.  ILennette Bihari, CMA, am acting as transcriptionist for Dr. Raliegh Scarlet, DO.  I have reviewed the above documentation for accuracy and completeness, and I agree with the above. [name], D.O.  The [place] was signed into law in 2016 which includes the topic of electronic health records.  This provides immediate access to information in MyChart.  This includes consultation notes, operative notes, office notes, lab results and pathology reports.  If you have any questions about what you read please let us know at your next visit so we can discuss your concerns and take corrective action if need be.  We are right here with you.

## 2021-09-22 NOTE — Addendum Note (Signed)
Addended by: [name] on: 09/22/2021 08:22 PM   Modules accepted: Level of Service

## 2021-10-07 ENCOUNTER — Encounter (INDEPENDENT_AMBULATORY_CARE_PROVIDER_SITE_OTHER): Payer: Self-pay | Admitting: Family Medicine

## 2021-10-07 ENCOUNTER — Ambulatory Visit (INDEPENDENT_AMBULATORY_CARE_PROVIDER_SITE_OTHER): Payer: PRIVATE HEALTH INSURANCE | Admitting: Family Medicine

## 2021-10-07 VITALS — BP 99/64 | HR 74 | Temp 98.0°F | Ht 60.0 in | Wt 135.0 lb

## 2021-10-07 DIAGNOSIS — E559 Vitamin D deficiency, unspecified: Secondary | ICD-10-CM

## 2021-10-07 DIAGNOSIS — E8881 Metabolic syndrome: Secondary | ICD-10-CM

## 2021-10-07 DIAGNOSIS — R632 Polyphagia: Secondary | ICD-10-CM

## 2021-10-07 DIAGNOSIS — E669 Obesity, unspecified: Secondary | ICD-10-CM | POA: Diagnosis not present

## 2021-10-07 DIAGNOSIS — Z9189 Other specified personal risk factors, not elsewhere classified: Secondary | ICD-10-CM

## 2021-10-07 DIAGNOSIS — Z7984 Long term (current) use of oral hypoglycemic drugs: Secondary | ICD-10-CM

## 2021-10-07 DIAGNOSIS — Z6826 Body mass index (BMI) 26.0-26.9, adult: Secondary | ICD-10-CM

## 2021-10-07 HISTORY — DX: Polyphagia: R63.2

## 2021-10-07 MED ORDER — VITAMIN D (ERGOCALCIFEROL) 1.25 MG (50000 UNIT) PO CAPS
ORAL_CAPSULE | ORAL | 0 refills | Status: DC
Start: 2021-10-07 — End: 2021-11-04

## 2021-10-07 MED ORDER — SEMAGLUTIDE-WEIGHT MANAGEMENT 0.5 MG/0.5ML ~~LOC~~ SOAJ: 0.5000 mg | SUBCUTANEOUS | 0 refills | Status: DC

## 2021-10-07 MED ORDER — METFORMIN HCL 500 MG PO TABS: ORAL_TABLET | ORAL | 0 refills | Status: DC

## 2021-10-13 NOTE — Progress Notes (Unsigned)
Chief Complaint:   OBESITY [name] is here to discuss her progress with her obesity treatment plan along with follow-up of her obesity related diagnoses. [name] is on the Category 3 Plan and states she is following her eating plan approximately 70% of the time. [name] states she is using the elliptical 40 minutes 4 times per week.  Today's visit was #: 27 Starting weight: 171 lbs Starting date: 12/27/2019 Today's weight: 135 lbs Today's date: 10/06/2021 Total lbs lost to date: 36 Total lbs lost since last in-office visit: 1  Interim History: [name] is doing well. She was off [facility] for a week and had hunger and cravings. She is now back on it and symptoms are controlled. Pt is still working out and in social settings. Wegovy decreases pt's desire to eat in excess or even drink.  Subjective:   1. Polyphagia [name] endorses excessive hunger.   2. Vitamin D deficiency She is currently taking prescription vitamin D 50,000 IU each week. She denies nausea, vomiting or muscle weakness.  3. Insulin resistance Goal is HgbA1c < 5.7, fasting insulin closer to 5.    4. At risk for constipation [name] is at risk for constipation due to drugs and inadequate intake of water.  Assessment/Plan:   1. Polyphagia Intensive lifestyle modifications are the first line treatment for this issue. We discussed several lifestyle modifications today and she will continue to work on diet, exercise and weight loss efforts. Orders and follow up as documented in patient record.  Counseling Polyphagia is excessive hunger. Causes can include: low blood sugars, hypERthyroidism, PMS, lack of sleep, stress, insulin resistance, diabetes, certain medications, and diets that are deficient in protein and fiber.   Refill- Semaglutide-Weight Management 0.5 MG/0.5ML SOAJ; Inject 0.5 mg into the skin once a week.  Dispense: 2 mL; Refill: 0  2. Vitamin D deficiency Low Vitamin D level contributes to  fatigue and are associated with obesity, breast, and colon cancer. She agrees to continue to take prescription Vitamin D 50,000 IU every 10 days and will follow-up for routine testing of Vitamin D, at least 2-3 times per year to avoid over-replacement.  Refill- Vitamin D, Ergocalciferol, (DRISDOL) 1.25 MG (50000 UNIT) CAPS capsule; 1 po q 10 days  Dispense: 3 capsule; Refill: 0  3. Insulin resistance [name] will continue to work on weight loss, exercise, and decreasing simple carbohydrates to help decrease the risk of diabetes. [name] agreed to follow-up with [place] as directed to closely monitor her progress.  Refill- metFORMIN (GLUCOPHAGE) 500 MG tablet; 1 po with lunch daily  Dispense: 30 tablet; Refill: 0  4. At risk for constipation [name] is at increased risk for constipation due to inadequate water intake, changes in diet, and/or use of certain medications. Patient was provided with 9 minutes of counseling today regarding this condition and related ones. We discussed preventative OTC therapies that exist such as MiraLAX, stool softeners, etc.   We also discussed the importance of adequate fiber intake and for patient to drink 1/2 weight in ounces of water per day, unless told otherwise by a Cardiologist, Nephrologist, or another medical provider.    5. Obesity, current BMI 26.4 [name] is currently in the action stage of change. As such, her goal is to continue with weight loss efforts. She has agreed to the Category 3 Plan.   [name] goal weight is 130-135 lbs.  Exercise goals:  As is  Behavioral modification strategies: increasing lean protein intake, decreasing simple carbohydrates, and planning  for success.  [name] has agreed to follow-up with our clinic in 4 weeks. She was informed of the importance of frequent follow-up visits to maximize her success with intensive lifestyle modifications for her multiple health conditions.   Objective:   Blood pressure 99/64,  pulse 74, temperature 98 F (36.7 C), height 5' (1.524 m), weight 135 lb (61.2 kg), SpO2 100 %. Body mass index is 26.37 kg/m.  General: Cooperative, alert, well developed, in no acute distress. HEENT: Conjunctivae and lids unremarkable. Cardiovascular: Regular rhythm.  Lungs: Normal work of breathing. Neurologic: No focal deficits.   Lab Results  Component Value Date   CREATININE 0.87 12/19/2019   BUN 16 12/19/2019   NA 140 12/19/2019   K 3.9 12/19/2019   CL 105 12/19/2019   CO2 28 12/19/2019   Lab Results  Component Value Date   ALT 16 12/19/2019   AST 19 12/19/2019   ALKPHOS 76 01/22/2016   BILITOT 0.3 12/19/2019   Lab Results  Component Value Date   HGBA1C 5.3 12/19/2019   Lab Results  Component Value Date   INSULIN 8.6 02/25/2021   INSULIN 8.1 06/25/2020   INSULIN 8.5 01/23/2020   Lab Results  Component Value Date   TSH 3.52 12/19/2019   Lab Results  Component Value Date   CHOL 213 (H) 02/25/2021   HDL 69 02/25/2021   LDLCALC 132 (H) 02/25/2021   TRIG 65 02/25/2021   CHOLHDL 3.2 06/25/2020   Lab Results  Component Value Date   VD25OH 56.6 08/02/2021   VD25OH 56.2 02/25/2021   VD25OH 94.9 11/18/2020   Lab Results  Component Value Date   WBC 8.8 12/19/2019   HGB 12.8 12/19/2019   HCT 38.5 12/19/2019   MCV 92.3 12/19/2019   PLT 245 12/19/2019   Lab Results  Component Value Date   FERRITIN 31.7 10/20/2014    Attestation Statements:   Reviewed by clinician on day of visit: allergies, medications, problem list, medical history, surgical history, family history, social history, and previous encounter notes.  I, Kyung Rudd, BS, CMA, am acting as transcriptionist for [employer], DO.  I have reviewed the above documentation for accuracy and completeness, and I agree with the above. -  ***

## 2021-10-21 ENCOUNTER — Telehealth (HOSPITAL_BASED_OUTPATIENT_CLINIC_OR_DEPARTMENT_OTHER): Payer: No Typology Code available for payment source | Admitting: Psychiatry

## 2021-10-21 DIAGNOSIS — F331 Major depressive disorder, recurrent, moderate: Secondary | ICD-10-CM

## 2021-10-21 DIAGNOSIS — G4700 Insomnia, unspecified: Secondary | ICD-10-CM | POA: Diagnosis not present

## 2021-10-21 DIAGNOSIS — F900 Attention-deficit hyperactivity disorder, predominantly inattentive type: Secondary | ICD-10-CM | POA: Diagnosis not present

## 2021-10-21 DIAGNOSIS — F411 Generalized anxiety disorder: Secondary | ICD-10-CM | POA: Diagnosis not present

## 2021-10-21 MED ORDER — BUSPIRONE HCL 30 MG PO TABS: 30.0000 mg | ORAL_TABLET | Freq: Two times a day (BID) | ORAL | 0 refills | Status: DC

## 2021-10-21 MED ORDER — LORAZEPAM 1 MG PO TABS: ORAL_TABLET | Freq: Every day | ORAL | 0 refills | Status: DC | PRN

## 2021-10-21 MED ORDER — METHYLPHENIDATE HCL ER (OSM) 18 MG PO TBCR: 18.0000 mg | EXTENDED_RELEASE_TABLET | Freq: Every day | ORAL | 0 refills | Status: DC

## 2021-10-21 MED ORDER — ARIPIPRAZOLE 10 MG PO TABS: 10.0000 mg | ORAL_TABLET | Freq: Every day | ORAL | 0 refills | Status: DC

## 2021-10-21 MED ORDER — BUPROPION HCL ER (XL) 300 MG PO TB24: 300.0000 mg | ORAL_TABLET | ORAL | 0 refills | Status: DC

## 2021-10-21 NOTE — Progress Notes (Signed)
Virtual Visit via Video Note  I connected with [name] on 10/21/21 at  3:00 PM EDT by a video enabled telemedicine application and verified that I am speaking with the correct person using two identifiers.  Location: Patient: home Provider: office   I discussed the limitations of evaluation and management by telemedicine and the availability of in person appointments. The patient expressed understanding and agreed to proceed.  History of Present Illness: [name] shares her anxiety is better, especially as compared to her depression. She is doing EMDR every 2 weeks with her therapist. A few days she has felt extra emotional but she was able to overcome it. [name] still has ruminating thoughts about stressors that could happen. It happens 3-4x/week for several hours a time. It can affect her sleep at times. Most nights she gets 4 hrs due to her work schedule. If she doesn't have work then she will sleep 8+ hrs. [name] has been struggling with her depression. She is having low energy and low motivation. She is spending more time in bed. She is having some negative thoughts about herself. It is hard to get things done. She denies SI/HI and passive thoughts of death. [name] met with her therapist this morning and they talked about it. She is feeling better. Her ADHD medication is making her dizzy. She takes it daily and feels dizzy 1 hour after taking it and is then intermittent after that. She is eating before taking the medication. . On days she doesn't take it then there is no dizziness. The Strattera helps a little. Her BP has been normal and she is getting it checked every 4 weeks at her doctor's office. She takes Ativan 3-4x/week.    Observations/Objective: Psychiatric Specialty Exam: ROS  There were no vitals taken for this visit.There is no height or weight on file to calculate BMI.  General Appearance: Casual and Neat  Eye Contact:  Good  Speech:  Clear and Coherent and  Normal Rate  Volume:  Normal  Mood:  Depressed  Affect:  Full Range  Thought Process:  Goal Directed, Linear, and Descriptions of Associations: Intact  Orientation:  Full (Time, Place, and Person)  Thought Content:  Logical  Suicidal Thoughts:  No  Homicidal Thoughts:  No  Memory:  Immediate;   Good  Judgement:  Good  Insight:  Good  Psychomotor Activity:  Normal  Concentration:  Concentration: Good  Recall:  Good  Fund of Knowledge:  Good  Language:  Good  Akathisia:  No  Handed:  Right  AIMS (if indicated):     Assets:  Communication Skills Desire for Improvement Financial Resources/Insurance Housing Leisure Time Resilience Social Support Talents/Skills Transportation Vocational/Educational  ADL's:  Intact  Cognition:  WNL  Sleep:        Assessment and Plan:     10/21/2021    3:18 PM 07/29/2021    1:08 PM 05/06/2021   11:32 AM 04/20/2021    2:58 PM 03/11/2021    1:12 PM  Depression screen PHQ 2/9  Decreased Interest 2 0 1 0 0  Down, Depressed, Hopeless '2 1 1 ' 0 1  PHQ - 2 Score '4 1 2 ' 0 1  Altered sleeping '1  1 1   ' Tired, decreased energy '3  1 1   ' Change in appetite 0  0 0   Feeling bad or failure about yourself  1  0 0   Trouble concentrating 2  0 0   Moving slowly or fidgety/restless 0  0 0   Suicidal thoughts 0  0 0   PHQ-9 Score '11  4 2   ' Difficult doing work/chores Very difficult  Somewhat difficult      Flowsheet Row Video Visit from 10/21/2021 in [facility] Video Visit from 07/29/2021 in [facility] Video Visit from 05/06/2021 in [facility]  C-SSRS RISK CATEGORY No Risk No Risk No Risk        The risk of un-intended pregnancy is low based on the fact that pt reports she is taking an oral contraceptive tablet daily. Pt is aware that these meds carry a teratogenic risk. Pt will discuss plan of action if she does or plans to become  pregnant in the future.  Status of current problems: ongoing depression and ADHD symptoms.   Meds: d/c Straterra due to SE and only mild improvement in symptoms  Start Concerta 15ZM po qD for ADHD. She took it as a child and found it to be helpful.   1. Major depressive disorder, recurrent episode, moderate (HCC) - ARIPiprazole (ABILIFY) 10 MG tablet; Take 1 tablet (10 mg total) by mouth daily.  Dispense: 90 tablet; Refill: 0 - buPROPion (WELLBUTRIN XL) 300 MG 24 hr tablet; Take 1 tablet (300 mg total) by mouth every morning.  Dispense: 90 tablet; Refill: 0 - busPIRone (BUSPAR) 30 MG tablet; Take 1 tablet (30 mg total) by mouth 2 (two) times daily.  Dispense: 180 tablet; Refill: 0  2. Attention deficit hyperactivity disorder (ADHD), predominantly inattentive type - buPROPion (WELLBUTRIN XL) 300 MG 24 hr tablet; Take 1 tablet (300 mg total) by mouth every morning.  Dispense: 90 tablet; Refill: 0 - methylphenidate (CONCERTA) 18 MG PO CR tablet; Take 1 tablet (18 mg total) by mouth daily.  Dispense: 30 tablet; Refill: 0  3. GAD (generalized anxiety disorder) - busPIRone (BUSPAR) 30 MG tablet; Take 1 tablet (30 mg total) by mouth 2 (two) times daily.  Dispense: 180 tablet; Refill: 0 - LORazepam (ATIVAN) 1 MG tablet; TAKE 1 TABLET BY MOUTH ONCE A DAY AS NEEDED FOR ANXIETY  Dispense: 30 tablet; Refill: 0  4. Insomnia, unspecified type - busPIRone (BUSPAR) 30 MG tablet; Take 1 tablet (30 mg total) by mouth 2 (two) times daily.  Dispense: 180 tablet; Refill: 0 - LORazepam (ATIVAN) 1 MG tablet; TAKE 1 TABLET BY MOUTH ONCE A DAY AS NEEDED FOR ANXIETY  Dispense: 30 tablet; Refill: 0     Labs: none    Therapy: brief supportive therapy provided.   Collaboration of Care: Other none  Patient/Guardian was advised Release of Information must be obtained prior to any record release in order to collaborate their care with an outside provider. Patient/Guardian was advised if they have not already done  so to contact the registration department to sign all necessary forms in order for [place] to release information regarding their care.   Consent: Patient/Guardian gives verbal consent for treatment and assignment of benefits for services provided during this visit. Patient/Guardian expressed understanding and agreed to proceed.   Follow Up Instructions: Follow up in 2-3 weeks or sooner if needed    I discussed the assessment and treatment plan with the patient. The patient was provided an opportunity to ask questions and all were answered. The patient agreed with the plan and demonstrated an understanding of the instructions.   The patient was advised to call back or seek an in-person evaluation if the symptoms worsen or if the condition fails  to improve as anticipated.  I provided 15 minutes of non-face-to-face time during this encounter.   Charlcie Cradle, MD

## 2021-11-04 ENCOUNTER — Telehealth (HOSPITAL_BASED_OUTPATIENT_CLINIC_OR_DEPARTMENT_OTHER): Payer: No Typology Code available for payment source | Admitting: Psychiatry

## 2021-11-04 ENCOUNTER — Ambulatory Visit (INDEPENDENT_AMBULATORY_CARE_PROVIDER_SITE_OTHER): Payer: PRIVATE HEALTH INSURANCE | Admitting: Family Medicine

## 2021-11-04 ENCOUNTER — Encounter (INDEPENDENT_AMBULATORY_CARE_PROVIDER_SITE_OTHER): Payer: Self-pay | Admitting: Family Medicine

## 2021-11-04 VITALS — BP 104/67 | HR 79 | Temp 98.1°F | Ht 60.0 in | Wt 133.0 lb

## 2021-11-04 DIAGNOSIS — Z6826 Body mass index (BMI) 26.0-26.9, adult: Secondary | ICD-10-CM

## 2021-11-04 DIAGNOSIS — Z79899 Other long term (current) drug therapy: Secondary | ICD-10-CM | POA: Diagnosis not present

## 2021-11-04 DIAGNOSIS — Z9189 Other specified personal risk factors, not elsewhere classified: Secondary | ICD-10-CM

## 2021-11-04 DIAGNOSIS — E8881 Metabolic syndrome: Secondary | ICD-10-CM | POA: Diagnosis not present

## 2021-11-04 DIAGNOSIS — E669 Obesity, unspecified: Secondary | ICD-10-CM

## 2021-11-04 DIAGNOSIS — E559 Vitamin D deficiency, unspecified: Secondary | ICD-10-CM | POA: Diagnosis not present

## 2021-11-04 DIAGNOSIS — E7849 Other hyperlipidemia: Secondary | ICD-10-CM

## 2021-11-04 DIAGNOSIS — R632 Polyphagia: Secondary | ICD-10-CM

## 2021-11-04 DIAGNOSIS — F900 Attention-deficit hyperactivity disorder, predominantly inattentive type: Secondary | ICD-10-CM

## 2021-11-04 MED ORDER — SEMAGLUTIDE-WEIGHT MANAGEMENT 0.5 MG/0.5ML ~~LOC~~ SOAJ: 0.5000 mg | SUBCUTANEOUS | 0 refills | Status: DC

## 2021-11-04 MED ORDER — METHYLPHENIDATE HCL ER (OSM) 36 MG PO TBCR: 36.0000 mg | EXTENDED_RELEASE_TABLET | Freq: Every day | ORAL | 0 refills | Status: DC

## 2021-11-04 MED ORDER — METFORMIN HCL 500 MG PO TABS: ORAL_TABLET | ORAL | 0 refills | Status: DC

## 2021-11-04 MED ORDER — VITAMIN D (ERGOCALCIFEROL) 1.25 MG (50000 UNIT) PO CAPS: ORAL_CAPSULE | ORAL | 0 refills | Status: DC

## 2021-11-04 NOTE — Progress Notes (Signed)
Virtual Visit via Video Note  I connected with [name] on 11/04/21 at  3:15 PM EDT by a video enabled telemedicine application and verified that I am speaking with the correct person using two identifiers.  Location: Patient: home Provider: office   I discussed the limitations of evaluation and management by telemedicine and the availability of in person appointments. The patient expressed understanding and agreed to proceed.  History of Present Illness: [name] notes that the Concerta is helping with mild increase in focus and energy. She denies headaches, insomnia, irritability, decreased appetite and GI upset. She would like to try an increased dose. Her mood is stable. She denies SI/HI.    Observations/Objective: Psychiatric Specialty Exam: ROS  There were no vitals taken for this visit.There is no height or weight on file to calculate BMI.  General Appearance: Neat and Well Groomed  Eye Contact:  Good  Speech:  Clear and Coherent and Normal Rate  Volume:  Normal  Mood:  Euthymic  Affect:  Full Range  Thought Process:  Goal Directed, Linear, and Descriptions of Associations: Intact  Orientation:  Full (Time, Place, and Person)  Thought Content:  Logical  Suicidal Thoughts:  No  Homicidal Thoughts:  No  Memory:  Immediate;   Good  Judgement:  Good  Insight:  Good  Psychomotor Activity:  Normal  Concentration:  Concentration: Good  Recall:  Good  Fund of Knowledge:  Good  Language:  Good  Akathisia:  No  Handed:  Right  AIMS (if indicated):     Assets:  Communication Skills Desire for Improvement Financial Resources/Insurance Housing Intimacy Leisure Time Physical Health Resilience Social Support Talents/Skills Transportation Vocational/Educational  ADL's:  Intact  Cognition:  WNL  Sleep:        Assessment and Plan:     10/21/2021    3:18 PM 07/29/2021    1:08 PM 05/06/2021   11:32 AM 04/20/2021    2:58 PM 03/11/2021    1:12 PM  Depression  screen PHQ 2/9  Decreased Interest 2 0 1 0 0  Down, Depressed, Hopeless 2 1 1  0 1  PHQ - 2 Score 4 1 2  0 1  Altered sleeping 1  1 1    Tired, decreased energy 3  1 1    Change in appetite 0  0 0   Feeling bad or failure about yourself  1  0 0   Trouble concentrating 2  0 0   Moving slowly or fidgety/restless 0  0 0   Suicidal thoughts 0  0 0   PHQ-9 Score 11  4 2    Difficult doing work/chores Very difficult  Somewhat difficult      Flowsheet Row [facility] 10/21/2021 in [facility] Video Visit from 07/29/2021 in [facility] Video Visit from 05/06/2021 in [facility]  C-SSRS RISK CATEGORY No Risk No Risk No Risk        Status of current problems: small improvement in focus  Meds: increase Concerta 36mg  po qD 1. Attention deficit hyperactivity disorder (ADHD), predominantly inattentive type - methylphenidate (CONCERTA) 36 MG PO CR tablet; Take 1 tablet (36 mg total) by mouth daily.  Dispense: 30 tablet; Refill: 0     Labs: none    Therapy: brief supportive therapy provided.    Collaboration of Care: Other none  Patient/Guardian was advised Release of Information must be obtained prior to any record release in order to collaborate their care with  an outside provider. Patient/Guardian was advised if they have not already done so to contact the registration department to sign all necessary forms in order for [place] to release information regarding their care.   Consent: Patient/Guardian gives verbal consent for treatment and assignment of benefits for services provided during this visit. Patient/Guardian expressed understanding and agreed to proceed.     Follow Up Instructions: Follow up in 4-6 weeks or sooner if needed    I discussed the assessment and treatment plan with the patient. The patient was provided an opportunity to ask questions and all were  answered. The patient agreed with the plan and demonstrated an understanding of the instructions.   The patient was advised to call back or seek an in-person evaluation if the symptoms worsen or if the condition fails to improve as anticipated.  I provided 6 minutes of non-face-to-face time during this encounter.   Oletta Darter, MD

## 2021-11-08 NOTE — Progress Notes (Signed)
Chief Complaint:   OBESITY [name] is here to discuss her progress with her obesity treatment plan along with follow-up of her obesity related diagnoses. [name] is on the Category 3 Plan and states she is following her eating plan approximately 60% of the time. [name] states she is using her elliptical  40 minutes 3 times per week.  Today's visit was #: 28 Starting weight: 171 lbs Starting date: 12/27/2019 Today's weight: 133 lbs Today's date: 11/04/2021 Total lbs lost to date: 36 lbs Total lbs lost since last in-office visit: 2 lbs  Interim History: [name] is here for a follow up office visit.  We reviewed her meal plan and all questions were answered.  Patient's food recall appears to be accurate and consistent with what is on plan when she is following it.   When eating on plan, her hunger and cravings are well controlled. [name] is doing great and she is back in in the gym 3-4 days per week for about 40-60 minutes.     Subjective:   1. Insulin resistance Goal is HgbA1c < 5.7, fasting insulin closer to 5.  [name] is tolerating medication(s) well without side effects.  Medication compliance is good as patient endorses taking it as prescribed.  The patient denies additional concerns regarding this condition. She is taking Metformin.  2. Vitamin D deficiency She is currently taking prescription vitamin D 50,000 IU every 10 days. She denies nausea, vomiting or muscle weakness.  3. High risk medication use [name] has some fatigue and recently had low normal magnesium.  About 4 months ago her level was at 1.6.  B12 has not been checked in in over one year and she is on Metformin which puts her at high risk deficiency.  4. Other hyperlipidemia Medication(s) reviewed. Patient denies myalgias.  The ASCVD Risk score (Arnett DK, et al., 2019) failed to calculate for the following reasons:   The 2019 ASCVD risk score is only valid for ages 24 to 64 She  is not taking any medications at this time.   5. At risk for dehydration [name] is at risk for dehydration due to lack of water intake.   Assessment/Plan:  No orders of the defined types were placed in this encounter.   Medications Discontinued During This Encounter  Medication Reason   Semaglutide-Weight Management 0.5 MG/0.5ML SOAJ Reorder   Vitamin D, Ergocalciferol, (DRISDOL) 1.25 MG (50000 UNIT) CAPS capsule Reorder   metFORMIN (GLUCOPHAGE) 500 MG tablet Reorder     Meds ordered this encounter  Medications   metFORMIN (GLUCOPHAGE) 500 MG tablet    Sig: 1 po with lunch daily    Dispense:  30 tablet    Refill:  0    30 d supply;  ** OV for RF **   Do not send RF request   Semaglutide-Weight Management 0.5 MG/0.5ML SOAJ    Sig: Inject 0.5 mg into the skin once a week.    Dispense:  2 mL    Refill:  0   Vitamin D, Ergocalciferol, (DRISDOL) 1.25 MG (50000 UNIT) CAPS capsule    Sig: 1 po q 10 days    Dispense:  3 capsule    Refill:  0    30 d supply;  ov for RF     1. Insulin resistance [name] will continue to work on weight loss, exercise, and decreasing simple carbohydrates to help decrease the risk of diabetes. [name] agreed to follow-up with [place] as directed to  closely monitor her progress.  Check fasting insulin and A1c.  Refill - metFORMIN (GLUCOPHAGE) 500 MG tablet; 1 po with lunch daily  Dispense: 30 tablet; Refill: 0  2. Vitamin D deficiency Low Vitamin D level contributes to fatigue and are associated with obesity, breast, and colon cancer. She agrees to continue to take prescription Vitamin D @50 ,000 IU every 10 days and will follow-up for routine testing of Vitamin D, at least 2-3 times per year to avoid over-replacement.  Refill - Vitamin D, Ergocalciferol, (DRISDOL) 1.25 MG (50000 UNIT) CAPS capsule; 1 po q 10 days  Dispense: 3 capsule; Refill: 0  3. High risk medication use Check labs today.  CBC, B12, Magnesium, TSH, FT4  4. Other  hyperlipidemia Cardiovascular risk and specific lipid/LDL goals reviewed.  We discussed several lifestyle modifications today and [name] will continue to work on diet, exercise and weight loss efforts. Orders and follow up as documented in patient record.   Counseling Intensive lifestyle modifications are the first line treatment for this issue. Dietary changes: Increase soluble fiber. Decrease simple carbohydrates. Exercise changes: Moderate to vigorous-intensity aerobic activity 150 minutes per week if tolerated. Lipid-lowering medications: see documented in medical record. Check labs Fasting Lipid Panel, CMP  5. At risk for dehydration [name] was given approximately 15 minutes of dehydration prevention counseling today. [name] is at risk for dehydration due to weight loss and current medication(s). She was encouraged to hydrate and monitor fluid status to avoid dehydration as weight loss plateaus.   6. Obesity, current BMI 26.1 Refill- Semaglutide-Weight Management 0.5 MG/0.5ML SOAJ; Inject 0.5 mg into the skin once a week.  Dispense: 2 mL; Refill: 0  [name] is currently in the action stage of change. As such, her goal is to continue with weight loss efforts. She has agreed to the Category 3 Plan.   Exercise goals:  As is, but add one more day weekly if possible.   Behavioral modification strategies: increasing lean protein intake and decreasing simple carbohydrates.  [name] has agreed to follow-up with our clinic in 4 weeks. She was informed of the importance of frequent follow-up visits to maximize her success with intensive lifestyle modifications for her multiple health conditions.   [name] was informed we would discuss her lab results at her next visit unless there is a critical issue that needs to be addressed sooner. [name] agreed to keep her next visit at the agreed upon time to discuss these results.  Objective:   Blood pressure 104/67, pulse 79,  temperature 98.1 F (36.7 C), height 5' (1.524 m), weight 133 lb (60.3 kg), SpO2 99 %. Body mass index is 25.97 kg/m.  General: Cooperative, alert, well developed, in no acute distress. HEENT: Conjunctivae and lids unremarkable. Cardiovascular: Regular rhythm.  Lungs: Normal work of breathing. Neurologic: No focal deficits.   Lab Results  Component Value Date   CREATININE 0.87 12/19/2019   BUN 16 12/19/2019   NA 140 12/19/2019   K 3.9 12/19/2019   CL 105 12/19/2019   CO2 28 12/19/2019   Lab Results  Component Value Date   ALT 16 12/19/2019   AST 19 12/19/2019   ALKPHOS 76 01/22/2016   BILITOT 0.3 12/19/2019   Lab Results  Component Value Date   HGBA1C 5.3 12/19/2019   Lab Results  Component Value Date   INSULIN 8.6 02/25/2021   INSULIN 8.1 06/25/2020   INSULIN 8.5 01/23/2020   Lab Results  Component Value Date   TSH 3.52 12/19/2019   Lab Results  Component Value Date   CHOL 213 (H) 02/25/2021   HDL 69 02/25/2021   LDLCALC 132 (H) 02/25/2021   TRIG 65 02/25/2021   CHOLHDL 3.2 06/25/2020   Lab Results  Component Value Date   VD25OH 56.6 08/02/2021   VD25OH 56.2 02/25/2021   VD25OH 94.9 11/18/2020   Lab Results  Component Value Date   WBC 8.8 12/19/2019   HGB 12.8 12/19/2019   HCT 38.5 12/19/2019   MCV 92.3 12/19/2019   PLT 245 12/19/2019   Lab Results  Component Value Date   FERRITIN 31.7 10/20/2014   Attestation Statements:   Reviewed by clinician on day of visit: allergies, medications, problem list, medical history, surgical history, family history, social history, and previous encounter notes.  I, Malcolm Metro, RMA, am acting as Energy manager for [employer], DO.   I have reviewed the above documentation for accuracy and completeness, and I agree with the above. [name], D.O.  The 21st Century Cures Act was signed into law in 2016 which includes the topic of electronic health records.  This provides immediate access to  information in MyChart.  This includes consultation notes, operative notes, office notes, lab results and pathology reports.  If you have any questions about what you read please let us know at your next visit so we can discuss your concerns and take corrective action if need be.  We are right here with you.

## 2021-12-01 ENCOUNTER — Encounter (INDEPENDENT_AMBULATORY_CARE_PROVIDER_SITE_OTHER): Payer: Self-pay | Admitting: Family Medicine

## 2021-12-01 ENCOUNTER — Encounter (INDEPENDENT_AMBULATORY_CARE_PROVIDER_SITE_OTHER): Payer: Self-pay

## 2021-12-01 ENCOUNTER — Ambulatory Visit (INDEPENDENT_AMBULATORY_CARE_PROVIDER_SITE_OTHER): Payer: PRIVATE HEALTH INSURANCE | Admitting: Family Medicine

## 2021-12-01 VITALS — BP 112/74 | HR 69 | Temp 97.8°F | Ht 60.0 in | Wt 130.0 lb

## 2021-12-01 DIAGNOSIS — Z79899 Other long term (current) drug therapy: Secondary | ICD-10-CM | POA: Diagnosis not present

## 2021-12-01 DIAGNOSIS — Z6833 Body mass index (BMI) 33.0-33.9, adult: Secondary | ICD-10-CM

## 2021-12-01 DIAGNOSIS — E559 Vitamin D deficiency, unspecified: Secondary | ICD-10-CM

## 2021-12-01 DIAGNOSIS — Z9189 Other specified personal risk factors, not elsewhere classified: Secondary | ICD-10-CM

## 2021-12-01 DIAGNOSIS — E88819 Insulin resistance, unspecified: Secondary | ICD-10-CM

## 2021-12-01 DIAGNOSIS — E669 Obesity, unspecified: Secondary | ICD-10-CM

## 2021-12-01 DIAGNOSIS — E7849 Other hyperlipidemia: Secondary | ICD-10-CM | POA: Diagnosis not present

## 2021-12-01 DIAGNOSIS — E8881 Metabolic syndrome: Secondary | ICD-10-CM | POA: Diagnosis not present

## 2021-12-01 DIAGNOSIS — Z6825 Body mass index (BMI) 25.0-25.9, adult: Secondary | ICD-10-CM

## 2021-12-01 MED ORDER — VITAMIN D (ERGOCALCIFEROL) 1.25 MG (50000 UNIT) PO CAPS: ORAL_CAPSULE | ORAL | 0 refills | Status: DC

## 2021-12-01 MED ORDER — METFORMIN HCL 500 MG PO TABS: ORAL_TABLET | ORAL | 0 refills | Status: DC

## 2021-12-01 MED ORDER — SEMAGLUTIDE-WEIGHT MANAGEMENT 0.5 MG/0.5ML ~~LOC~~ SOAJ: 0.5000 mg | SUBCUTANEOUS | 0 refills | Status: DC

## 2021-12-02 ENCOUNTER — Other Ambulatory Visit (INDEPENDENT_AMBULATORY_CARE_PROVIDER_SITE_OTHER): Payer: Self-pay | Admitting: Family Medicine

## 2021-12-02 ENCOUNTER — Telehealth (HOSPITAL_BASED_OUTPATIENT_CLINIC_OR_DEPARTMENT_OTHER): Payer: PRIVATE HEALTH INSURANCE | Admitting: Psychiatry

## 2021-12-02 DIAGNOSIS — G4700 Insomnia, unspecified: Secondary | ICD-10-CM | POA: Diagnosis not present

## 2021-12-02 DIAGNOSIS — F331 Major depressive disorder, recurrent, moderate: Secondary | ICD-10-CM | POA: Diagnosis not present

## 2021-12-02 DIAGNOSIS — F411 Generalized anxiety disorder: Secondary | ICD-10-CM

## 2021-12-02 DIAGNOSIS — F900 Attention-deficit hyperactivity disorder, predominantly inattentive type: Secondary | ICD-10-CM

## 2021-12-02 LAB — COMPREHENSIVE METABOLIC PANEL
ALT: 18 IU/L (ref 0–32)
AST: 19 IU/L (ref 0–40)
Albumin/Globulin Ratio: 1.8 (ref 1.2–2.2)
Albumin: 4.3 g/dL (ref 3.9–4.9)
Alkaline Phosphatase: 42 IU/L — ABNORMAL LOW (ref 44–121)
BUN/Creatinine Ratio: 16 (ref 9–23)
BUN: 12 mg/dL (ref 6–20)
Bilirubin Total: 0.3 mg/dL (ref 0.0–1.2)
CO2: 20 mmol/L (ref 20–29)
Calcium: 9.2 mg/dL (ref 8.7–10.2)
Chloride: 104 mmol/L (ref 96–106)
Creatinine, Ser: 0.76 mg/dL (ref 0.57–1.00)
Globulin, Total: 2.4 g/dL (ref 1.5–4.5)
Glucose: 83 mg/dL (ref 70–99)
Potassium: 4.5 mmol/L (ref 3.5–5.2)
Sodium: 142 mmol/L (ref 134–144)
Total Protein: 6.7 g/dL (ref 6.0–8.5)
eGFR: 105 mL/min/{1.73_m2} (ref >59)

## 2021-12-02 LAB — CBC WITH DIFFERENTIAL/PLATELET
Basophils Absolute: 0 10*3/uL (ref 0.0–0.2)
Basos: 0 %
EOS (ABSOLUTE): 0 10*3/uL (ref 0.0–0.4)
Eos: 0 %
Hematocrit: 37.4 % (ref 34.0–46.6)
Hemoglobin: 12.6 g/dL (ref 11.1–15.9)
Immature Grans (Abs): 0 10*3/uL (ref 0.0–0.1)
Immature Granulocytes: 0 %
Lymphocytes Absolute: 3 10*3/uL (ref 0.7–3.1)
Lymphs: 43 %
MCH: 31.8 pg (ref 26.6–33.0)
MCHC: 33.7 g/dL (ref 31.5–35.7)
MCV: 94 fL (ref 79–97)
Monocytes Absolute: 0.5 10*3/uL (ref 0.1–0.9)
Monocytes: 7 %
Neutrophils Absolute: 3.5 10*3/uL (ref 1.4–7.0)
Neutrophils: 50 %
Platelets: 276 10*3/uL (ref 150–450)
RBC: 3.96 x10E6/uL (ref 3.77–5.28)
RDW: 12.1 % (ref 11.7–15.4)
WBC: 7.1 10*3/uL (ref 3.4–10.8)

## 2021-12-02 LAB — INSULIN, RANDOM: INSULIN: 6.7 u[IU]/mL (ref 2.6–24.9)

## 2021-12-02 LAB — TSH: TSH: 0.959 u[IU]/mL (ref 0.450–4.500)

## 2021-12-02 LAB — LIPID PANEL
Chol/HDL Ratio: 2.2 ratio (ref 0.0–4.4)
Cholesterol, Total: 240 mg/dL — ABNORMAL HIGH (ref 100–199)
HDL: 109 mg/dL (ref >39)
LDL Chol Calc (NIH): 116 mg/dL — ABNORMAL HIGH (ref 0–99)
Triglycerides: 89 mg/dL (ref 0–149)
VLDL Cholesterol Cal: 15 mg/dL (ref 5–40)

## 2021-12-02 LAB — VITAMIN D 25 HYDROXY (VIT D DEFICIENCY, FRACTURES): Vit D, 25-Hydroxy: 112 ng/mL — ABNORMAL HIGH (ref 30.0–100.0)

## 2021-12-02 LAB — MAGNESIUM: Magnesium: 2.1 mg/dL (ref 1.6–2.3)

## 2021-12-02 LAB — VITAMIN B12: Vitamin B-12: 513 pg/mL (ref 232–1245)

## 2021-12-02 LAB — HEMOGLOBIN A1C
Est. average glucose Bld gHb Est-mCnc: 97 mg/dL
Hgb A1c MFr Bld: 5 % (ref 4.8–5.6)

## 2021-12-02 LAB — T4, FREE: Free T4: 1.44 ng/dL (ref 0.82–1.77)

## 2021-12-02 MED ORDER — METHYLPHENIDATE HCL ER (OSM) 36 MG PO TBCR: 36.0000 mg | EXTENDED_RELEASE_TABLET | Freq: Every day | ORAL | 0 refills | Status: DC

## 2021-12-02 MED ORDER — ARIPIPRAZOLE 10 MG PO TABS: 10.0000 mg | ORAL_TABLET | Freq: Every day | ORAL | 0 refills | Status: DC

## 2021-12-02 MED ORDER — BUSPIRONE HCL 30 MG PO TABS: 30.0000 mg | ORAL_TABLET | Freq: Two times a day (BID) | ORAL | 0 refills | Status: DC

## 2021-12-02 MED ORDER — BUPROPION HCL ER (XL) 300 MG PO TB24: 300.0000 mg | ORAL_TABLET | ORAL | 0 refills | Status: DC

## 2021-12-02 MED ORDER — LORAZEPAM 1 MG PO TABS: ORAL_TABLET | Freq: Every day | ORAL | 1 refills | Status: DC | PRN

## 2021-12-02 NOTE — Progress Notes (Signed)
Virtual Visit via Video Note  I connected with [name] on 12/02/21 at  3:30 PM EDT by   a video enabled telemedicine application and verified that I am speaking with the correct person using two identifiers.  Location: Patient: home Provider: office   I discussed the limitations of evaluation and management by telemedicine and the availability of in person appointments. The patient expressed understanding and agreed to proceed.  History of Present Illness: "Pretty good". [name] shares that she has been busy with work. Things are good at home. She has on/off depression thru the week when she is not busy. Those days she is off from work and is at home. She will have racing thoughts and feelings of worthlessness. She denies SI/HI. [name] is endorsing some anhedonia and low motivation. Her focus has improved. Her sleep is good and energy is limited. Her appetite is a little decreased. Going to the gym helps to improve her mood a lot. Her anxiety is "doing pretty good". She gets racing thoughts but Ativan helps. She takes it about 3x/week and within 1 hr she feels better. The Concerta is working well and she has noticed improved concentration. She thinks that is hwy her appetite is decreased she is eating more small meals to deal with it.  She continues to work with a weight loss clinic for the last 2 years.    Observations/Objective: Psychiatric Specialty Exam: ROS  There were no vitals taken for this visit.There is no height or weight on file to calculate BMI.  General Appearance: Neat and Well Groomed  Eye Contact:  Good  Speech:  Clear and Coherent and Normal Rate  Volume:  Normal  Mood:  Anxious and Depressed  Affect:  Full Range  Thought Process:  Goal Directed, Linear, and Descriptions of Associations: Intact  Orientation:  Full (Time, Place, and Person)  Thought Content:  Logical  Suicidal Thoughts:  No  Homicidal Thoughts:  No  Memory:  Immediate;   Good  Judgement:   Good  Insight:  Good  Psychomotor Activity:  Normal  Concentration:  Concentration: Good  Recall:  Good  Fund of Knowledge:  Good  Language:  Good  Akathisia:  No  Handed:  Right  AIMS (if indicated):     Assets:  Communication Skills Desire for Improvement Financial Resources/Insurance Housing Intimacy Leisure Time Physical Health Resilience Social Support Talents/Skills Transportation Vocational/Educational  ADL's:  Intact  Cognition:  WNL  Sleep:        Assessment and Plan:     12/02/2021    3:35 PM 10/21/2021    3:18 PM 07/29/2021    1:08 PM 05/06/2021   11:32 AM 04/20/2021    2:58 PM  Depression screen PHQ 2/9  Decreased Interest 1 2 0 1 0  Down, Depressed, Hopeless 1 2 1 1  0  PHQ - 2 Score 2 4 1 2  0  Altered sleeping 0 1  1 1   Tired, decreased energy 1 3  1 1   Change in appetite 1 0  0 0  Feeling bad or failure about yourself  1 1  0 0  Trouble concentrating 0 2  0 0  Moving slowly or fidgety/restless 0 0  0 0  Suicidal thoughts 0 0  0 0  PHQ-9 Score 5 11  4 2   Difficult doing work/chores Somewhat difficult Very difficult  Somewhat difficult     Flowsheet Row Video Visit from 12/02/2021 in [facility] Video Visit from 10/21/2021  in [facility] Video Visit from 07/29/2021 in [facility]  C-SSRS RISK CATEGORY No Risk No Risk No Risk         Pt is aware that these meds carry a teratogenic risk. Pt will discuss plan of action if she does or plans to become pregnant in the future.  Status of current problems: stable  Meds:  1. Major depressive disorder, recurrent episode, moderate (HCC) - ARIPiprazole (ABILIFY) 10 MG tablet; Take 1 tablet (10 mg total) by mouth daily.  Dispense: 90 tablet; Refill: 0 - buPROPion (WELLBUTRIN XL) 300 MG 24 hr tablet; Take 1 tablet (300 mg total) by mouth every morning.  Dispense: 90 tablet; Refill: 0 -  busPIRone (BUSPAR) 30 MG tablet; Take 1 tablet (30 mg total) by mouth 2 (two) times daily.  Dispense: 180 tablet; Refill: 0  2. GAD (generalized anxiety disorder) - busPIRone (BUSPAR) 30 MG tablet; Take 1 tablet (30 mg total) by mouth 2 (two) times daily.  Dispense: 180 tablet; Refill: 0 - LORazepam (ATIVAN) 1 MG tablet; TAKE 1 TABLET BY MOUTH ONCE A DAY AS NEEDED FOR ANXIETY  Dispense: 30 tablet; Refill: 1  3. Attention deficit hyperactivity disorder (ADHD), predominantly inattentive type - buPROPion (WELLBUTRIN XL) 300 MG 24 hr tablet; Take 1 tablet (300 mg total) by mouth every morning.  Dispense: 90 tablet; Refill: 0 - methylphenidate (CONCERTA) 36 MG PO CR tablet; Take 1 tablet (36 mg total) by mouth daily.  Dispense: 30 tablet; Refill: 0 - methylphenidate (CONCERTA) 36 MG PO CR tablet; Take 1 tablet (36 mg total) by mouth daily.  Dispense: 30 tablet; Refill: 0 - methylphenidate (CONCERTA) 36 MG PO CR tablet; Take 1 tablet (36 mg total) by mouth daily.  Dispense: 30 tablet; Refill: 0  4. Insomnia, unspecified type - busPIRone (BUSPAR) 30 MG tablet; Take 1 tablet (30 mg total) by mouth 2 (two) times daily.  Dispense: 180 tablet; Refill: 0 - LORazepam (ATIVAN) 1 MG tablet; TAKE 1 TABLET BY MOUTH ONCE A DAY AS NEEDED FOR ANXIETY  Dispense: 30 tablet; Refill: 1     Labs: none    Therapy: brief supportive therapy provided.     Collaboration of Care: Other none  Patient/Guardian was advised Release of Information must be obtained prior to any record release in order to collaborate their care with an outside provider. Patient/Guardian was advised if they have not already done so to contact the registration department to sign all necessary forms in order for [place] to release information regarding their care.   Consent: Patient/Guardian gives verbal consent for treatment and assignment of benefits for services provided during this visit. Patient/Guardian expressed understanding and agreed to  proceed.     Follow Up Instructions: Follow up in 2-3 months or sooner if needed    I discussed the assessment and treatment plan with the patient. The patient was provided an opportunity to ask questions and all were answered. The patient agreed with the plan and demonstrated an understanding of the instructions.   The patient was advised to call back or seek an in-person evaluation if the symptoms worsen or if the condition fails to improve as anticipated.  I provided 11 minutes of non-face-to-face time during this encounter.   Oletta Darter, MD

## 2021-12-10 NOTE — Progress Notes (Signed)
Chief Complaint:   OBESITY [name] is here to discuss her progress with her obesity treatment plan along with follow-up of her obesity related diagnoses. [name] is on the Category 3 Plan and states she is following her eating plan approximately 75% of the time. [name] states she is using elliptical 45 minutes 3 times per week.  Today's visit was #: 29 Starting weight: 171 lbs Starting date: 12/27/2019 Today's weight: 130 lbs Today's date: 12/01/2021 Total lbs lost to date: 41 Total lbs lost since last in-office visit: 3  Interim History: Pt followed meal plan more closely and is doing great! She is having some sweets cravings at night after dinner, like ice cream. She is taking the Metformin with lunch daily.  Subjective:   1. Other hyperlipidemia [name] has been eating much healthier the past several months. Medication: None  2. Insulin resistance She is on Metformin and notes it works well but she has some increased snacking after dinner. Pt requests increasing Metformin dose. Medication: Metformin and Wegovy  3. Vitamin D deficiency She is currently taking prescription vitamin D 50,000 IU each week. She denies nausea, vomiting or muscle weakness.  4. High risk medication use Pt reports fatigue from time to time. Also, with increasing dose of Metformin, pt is at higher risk for B12 and magnesium deficiency.  5. At risk for side effect of medication [name] is at risk for side effects of medication due to increasing current dose.  Assessment/Plan:   Orders Placed This Encounter  Procedures   Vitamin B12   Magnesium   T4, free   TSH   CBC with Differential/Platelet   VITAMIN D 25 Hydroxy (Vit-D Deficiency, Fractures)   Lipid panel   Insulin, random   Hemoglobin A1c   Comprehensive metabolic panel    Medications Discontinued During This Encounter  Medication Reason   metFORMIN (GLUCOPHAGE) 500 MG tablet Reorder   Semaglutide-Weight Management 0.5  MG/0.5ML SOAJ Reorder   Vitamin D, Ergocalciferol, (DRISDOL) 1.25 MG (50000 UNIT) CAPS capsule Reorder     Meds ordered this encounter  Medications   DISCONTD: Vitamin D, Ergocalciferol, (DRISDOL) 1.25 MG (50000 UNIT) CAPS capsule    Sig: 1 po q 10 days    Dispense:  3 capsule    Refill:  0    30 d supply;  ov for RF   metFORMIN (GLUCOPHAGE) 500 MG tablet    Sig: 1 po with lunch and 1 po with dinner daily    Dispense:  60 tablet    Refill:  0    30 d supply;  ** OV for RF **   Do not send RF request   Semaglutide-Weight Management 0.5 MG/0.5ML SOAJ    Sig: Inject 0.5 mg into the skin once a week.    Dispense:  2 mL    Refill:  0     1. Other hyperlipidemia Cardiovascular risk and specific lipid/LDL goals reviewed.  We discussed several lifestyle modifications today and [name] will continue to work on diet, exercise and weight loss efforts. Orders and follow up as documented in patient record.   Counseling Intensive lifestyle modifications are the first line treatment for this issue. Dietary changes: Increase soluble fiber. Decrease simple carbohydrates. Exercise changes: Moderate to vigorous-intensity aerobic activity 150 minutes per week if tolerated. Lipid-lowering medications: see documented in medical record.  Lab/Orders today: - Lipid panel - Comprehensive metabolic panel  2. Insulin resistance [name] will continue to work on weight loss, exercise, and decreasing simple  carbohydrates to help decrease the risk of diabetes. [name] agreed to follow-up with [place] as directed to closely monitor her progress. Increase Metformin to BID at lunch and dinner.  Increase & Refill- metFORMIN (GLUCOPHAGE) 500 MG tablet; 1 po with lunch and 1 po with dinner daily  Dispense: 60 tablet; Refill: 0  Lab/Orders today: - Insulin, random - Hemoglobin A1c  3. Vitamin D deficiency Low Vitamin D level contributes to fatigue and are associated with obesity, breast, and colon cancer. She  agrees to continue to take prescription Vitamin D @50 ,000 IU every week and will follow-up for routine testing of Vitamin D, at least 2-3 times per year to avoid over-replacement.  Lab/Orders today: - VITAMIN D 25 Hydroxy (Vit-D Deficiency, Fractures)  4. High risk medication use Risks and benefits of meds discussed with pt.   Lab/Orders today: - Vitamin B12 - Magnesium - T4, free - TSH - CBC with Differential/Platelet  5. At risk for side effect of medication Due to [name]'s current conditions and medications, she is at a higher risk for drug side effect.  At least 10 minutes was spent on counseling her about these concerns today.  We discussed the benefits and potential risks of these medications, and all of patient's concerns were addressed and questions were answered.  she will call us, or their PCP or other specialists who treat their conditions with medications, with any questions or concerns that may develop.    6. Obesity, current BMI 25.5 [name] is currently in the action stage of change. As such, her goal is to continue with weight loss efforts. She has agreed to the Category 3 Plan.   We discussed various medication options to help [name] with her weight loss efforts and we both agreed to continue Wegovy 0.5 mg. Pt declines increase in dose. Refill- Semaglutide-Weight Management 0.5 MG/0.5ML SOAJ; Inject 0.5 mg into the skin once a week.  Dispense: 2 mL; Refill: 0  Exercise goals:  As is  Behavioral modification strategies: meal planning and cooking strategies, better snacking choices, and planning for success.  [name] has agreed to follow-up with our clinic in 4 weeks. She was informed of the importance of frequent follow-up visits to maximize her success with intensive lifestyle modifications for her multiple health conditions.   [name] was informed we would discuss her lab results at her next visit unless there is a critical issue that needs to be addressed  sooner. [name] agreed to keep her next visit at the agreed upon time to discuss these results.  Objective:   Blood pressure 112/74, pulse 69, temperature 97.8 F (36.6 C), height 5' (1.524 m), weight 130 lb (59 kg), SpO2 99 %. Body mass index is 25.39 kg/m.  General: Cooperative, alert, well developed, in no acute distress. HEENT: Conjunctivae and lids unremarkable. Cardiovascular: Regular rhythm.  Lungs: Normal work of breathing. Neurologic: No focal deficits.   Lab Results  Component Value Date   CREATININE 0.76 12/01/2021   BUN 12 12/01/2021   NA 142 12/01/2021   K 4.5 12/01/2021   CL 104 12/01/2021   CO2 20 12/01/2021   Lab Results  Component Value Date   ALT 18 12/01/2021   AST 19 12/01/2021   ALKPHOS 42 (L) 12/01/2021   BILITOT 0.3 12/01/2021   Lab Results  Component Value Date   HGBA1C 5.0 12/01/2021   HGBA1C 5.3 12/19/2019   Lab Results  Component Value Date   INSULIN 6.7 12/01/2021   INSULIN 8.6 02/25/2021   INSULIN 8.1  06/25/2020   INSULIN 8.5 01/23/2020   Lab Results  Component Value Date   TSH 0.959 12/01/2021   Lab Results  Component Value Date   CHOL 240 (H) 12/01/2021   HDL 109 12/01/2021   LDLCALC 116 (H) 12/01/2021   TRIG 89 12/01/2021   CHOLHDL 2.2 12/01/2021   Lab Results  Component Value Date   VD25OH 112.0 (H) 12/01/2021   VD25OH 56.6 08/02/2021   VD25OH 56.2 02/25/2021   Lab Results  Component Value Date   WBC 7.1 12/01/2021   HGB 12.6 12/01/2021   HCT 37.4 12/01/2021   MCV 94 12/01/2021   PLT 276 12/01/2021   Lab Results  Component Value Date   FERRITIN 31.7 10/20/2014   Attestation Statements:   Reviewed by clinician on day of visit: allergies, medications, problem list, medical history, surgical history, family history, social history, and previous encounter notes.  I, Kyung Rudd, BS, CMA, am acting as transcriptionist for [employer], DO.   I have reviewed the above documentation for accuracy and  completeness, and I agree with the above. [name], D.O.  The 21st Century Cures Act was signed into law in 2016 which includes the topic of electronic health records.  This provides immediate access to information in MyChart.  This includes consultation notes, operative notes, office notes, lab results and pathology reports.  If you have any questions about what you read please let us know at your next visit so we can discuss your concerns and take corrective action if need be.  We are right here with you.

## 2021-12-30 ENCOUNTER — Encounter (INDEPENDENT_AMBULATORY_CARE_PROVIDER_SITE_OTHER): Payer: Self-pay | Admitting: Family Medicine

## 2021-12-30 ENCOUNTER — Ambulatory Visit (INDEPENDENT_AMBULATORY_CARE_PROVIDER_SITE_OTHER): Payer: PRIVATE HEALTH INSURANCE | Admitting: Family Medicine

## 2021-12-30 VITALS — BP 102/71 | HR 85 | Temp 98.7°F | Ht 60.0 in | Wt 129.0 lb

## 2021-12-30 DIAGNOSIS — Z6825 Body mass index (BMI) 25.0-25.9, adult: Secondary | ICD-10-CM

## 2021-12-30 DIAGNOSIS — E669 Obesity, unspecified: Secondary | ICD-10-CM | POA: Diagnosis not present

## 2021-12-30 DIAGNOSIS — E8881 Metabolic syndrome: Secondary | ICD-10-CM | POA: Diagnosis not present

## 2021-12-30 DIAGNOSIS — E7849 Other hyperlipidemia: Secondary | ICD-10-CM

## 2021-12-30 DIAGNOSIS — Z9189 Other specified personal risk factors, not elsewhere classified: Secondary | ICD-10-CM

## 2021-12-30 DIAGNOSIS — E559 Vitamin D deficiency, unspecified: Secondary | ICD-10-CM | POA: Diagnosis not present

## 2021-12-30 MED ORDER — METFORMIN HCL 500 MG PO TABS: ORAL_TABLET | ORAL | 0 refills | Status: DC

## 2021-12-30 MED ORDER — SEMAGLUTIDE-WEIGHT MANAGEMENT 0.5 MG/0.5ML ~~LOC~~ SOAJ: 0.5000 mg | SUBCUTANEOUS | 0 refills | Status: DC

## 2022-01-06 NOTE — Progress Notes (Unsigned)
Chief Complaint:   OBESITY [name] is here to discuss her progress with her obesity treatment plan along with follow-up of her obesity related diagnoses. [name] is on the Category 3 Plan and states she is following her eating plan approximately 60% of the time. [name] states she is using the elliptical at the gym 40 minutes 3 times per week.  Today's visit was #: 30 Starting weight: 171 lbs Starting date: 12/27/2019 Today's weight: 129 lbs Today's date: 12/30/2021 Total lbs lost to date: 42 Total lbs lost since last in-office visit: 1  Interim History: [name] went out of town to [employer] and ate out a lot but was very mindful of eating healthy choices. [name] is here for a follow up office visit.  We reviewed her meal plan and all questions were answered.  Patient's food recall appears to be accurate and consistent with what is on plan when she is following it. When eating on plan, her hunger and cravings are well controlled.    Subjective:   1. Insulin resistance Discussed labs with patient today. Goal is HgbA1c < 5.7, fasting insulin closer to 5.    2. Other hyperlipidemia Discussed labs with patient today. [name] has hyperlipidemia and has been trying to improve her cholesterol levels with intensive lifestyle modifications including a low saturated fat diet, exercise, and weight loss. She denies any chest pain, claudication, or myalgias.  3. Vitamin D deficiency Discussed labs with patient today. Pt discontinued her ergocalciferol on 12/02/21. She is asymptomatic and without concerns.  4. At risk for dehydration [name] is at risk for dehydration due to inadequate water intake.  Assessment/Plan:  No orders of the defined types were placed in this encounter.   Medications Discontinued During This Encounter  Medication Reason   metFORMIN (GLUCOPHAGE) 500 MG tablet Reorder   Semaglutide-Weight Management 0.5 MG/0.5ML SOAJ Reorder     Meds  ordered this encounter  Medications   Semaglutide-Weight Management 0.5 MG/0.5ML SOAJ    Sig: Inject 0.5 mg into the skin once a week.    Dispense:  2 mL    Refill:  0   metFORMIN (GLUCOPHAGE) 500 MG tablet    Sig: 1 po with lunch and 1 po with dinner daily    Dispense:  60 tablet    Refill:  0    30 d supply;  ** OV for RF **   Do not send RF request     1. Insulin resistance Fasting insulin is almost within normal limits and only slightly elevated now at 6.7, with A1c of 5.0.  Refill- metFORMIN (GLUCOPHAGE) 500 MG tablet; 1 po with lunch and 1 po with dinner daily  Dispense: 60 tablet; Refill: 0  2. Other hyperlipidemia Much improved now with HDL of 109 and LDL down to 116. Cardiovascular risk and specific lipid/LDL goals reviewed.  We discussed several lifestyle modifications today and [name] will continue to work on diet, exercise and weight loss efforts. Orders and follow up as documented in patient record.   Counseling Intensive lifestyle modifications are the first line treatment for this issue. Dietary changes: Increase soluble fiber. Decrease simple carbohydrates. Exercise changes: Moderate to vigorous-intensity aerobic activity 150 minutes per week if tolerated. Lipid-lowering medications: see documented in medical record.  3. Vitamin D deficiency Pt discontinued Vit D supplement, as labs showed a level of 112. Repeat results in 3 months.   4. At risk for dehydration [name] is at higher than average risk of dehydration.  [name] was given more than 9 minutes of proper hydration counseling today.  We discussed the signs and symptoms of dehydration, some of which may include muscle cramping, constipation or even orthostatic symptoms.  Counseling on the prevention of dehydration was also provided today.  [name] is at risk for dehydration due to weight loss, lifestyle and behavorial habits and possibly due to taking certain medication(s).  She was encouraged to  adequately hydrate and monitor fluid status to avoid dehydration as well as weight loss plateaus.  Unless pre-existing renal or cardiopulmonary conditions exist, in which patient was told to limit their fluid intake, I recommended roughly one half of their weight in pounds to be the approximate ounces of non-caloric, non-caffeinated beverages they should drink per day; including more if they are engaging in exercise.  [name] is at higher than average risk of dehydration.  [name] was given more than 9 minutes of proper hydration counseling today.  We discussed the signs and symptoms of dehydration, some of which may include muscle cramping, constipation, or even orthostatic symptoms.  Counseling on the prevention of dehydration was also provided today.  [name] is at risk for dehydration due to weight loss, lifestyle and behavorial habits, and possibly due to taking certain medication(s).  She was encouraged to adequately hydrate and monitor fluid status to avoid dehydration as well as weight loss plateaus.  Unless pre-existing renal or cardiopulmonary conditions exist, which pt was told to limit their fluid intake.  I recommended roughly one half of their weight in pounds to be the approximate ounces of non-caloric, non-caffeinated beverages they should drink per day; including more if they are engaging in exercise.  5. Obesity, current BMI 25.2 [name] is currently in the action stage of change. As such, her goal is to continue with weight loss efforts. She has agreed to the Category 3 Plan.   We discussed various medication options to help [name] with her weight loss efforts and we both agreed to continue [facility], as per below. Refill- Semaglutide-Weight Management 0.5 MG/0.5ML SOAJ; Inject 0.5 mg into the skin once a week.  Dispense: 2 mL; Refill: 0  Continue mindful eating.  Exercise goals:  As is  Behavioral modification strategies: increasing lean protein intake, decreasing simple  carbohydrates, and planning for success.  [name] has agreed to follow-up with our clinic in 3-4 weeks. She was informed of the importance of frequent follow-up visits to maximize her success with intensive lifestyle modifications for her multiple health conditions.   Objective:   Blood pressure 102/71, pulse 85, temperature 98.7 F (37.1 C), height 5' (1.524 m), weight 129 lb (58.5 kg), SpO2 100 %. Body mass index is 25.19 kg/m.  General: Cooperative, alert, well developed, in no acute distress. HEENT: Conjunctivae and lids unremarkable. Cardiovascular: Regular rhythm.  Lungs: Normal work of breathing. Neurologic: No focal deficits.   Lab Results  Component Value Date   CREATININE 0.76 12/01/2021   BUN 12 12/01/2021   NA 142 12/01/2021   K 4.5 12/01/2021   CL 104 12/01/2021   CO2 20 12/01/2021   Lab Results  Component Value Date   ALT 18 12/01/2021   AST 19 12/01/2021   ALKPHOS 42 (L) 12/01/2021   BILITOT 0.3 12/01/2021   Lab Results  Component Value Date   HGBA1C 5.0 12/01/2021   HGBA1C 5.3 12/19/2019   Lab Results  Component Value Date   INSULIN 6.7 12/01/2021   INSULIN 8.6 02/25/2021   INSULIN 8.1 06/25/2020   INSULIN 8.5 01/23/2020  Lab Results  Component Value Date   TSH 0.959 12/01/2021   Lab Results  Component Value Date   CHOL 240 (H) 12/01/2021   HDL 109 12/01/2021   LDLCALC 116 (H) 12/01/2021   TRIG 89 12/01/2021   CHOLHDL 2.2 12/01/2021   Lab Results  Component Value Date   VD25OH 112.0 (H) 12/01/2021   VD25OH 56.6 08/02/2021   VD25OH 56.2 02/25/2021   Lab Results  Component Value Date   WBC 7.1 12/01/2021   HGB 12.6 12/01/2021   HCT 37.4 12/01/2021   MCV 94 12/01/2021   PLT 276 12/01/2021   Lab Results  Component Value Date   FERRITIN 31.7 10/20/2014   Attestation Statements:   Reviewed by clinician on day of visit: allergies, medications, problem list, medical history, surgical history, family history, social history, and  previous encounter notes.  I, Kyung Rudd, BS, CMA, am acting as transcriptionist for [employer], DO.   I have reviewed the above documentation for accuracy and completeness, and I agree with the above. [name], D.O.  The 21st Century Cures Act was signed into law in 2016 which includes the topic of electronic health records.  This provides immediate access to information in MyChart.  This includes consultation notes, operative notes, office notes, lab results and pathology reports.  If you have any questions about what you read please let us know at your next visit so we can discuss your concerns and take corrective action if need be.  We are right here with you.

## 2022-01-27 ENCOUNTER — Telehealth (INDEPENDENT_AMBULATORY_CARE_PROVIDER_SITE_OTHER): Payer: Self-pay | Admitting: Family Medicine

## 2022-01-27 ENCOUNTER — Encounter (INDEPENDENT_AMBULATORY_CARE_PROVIDER_SITE_OTHER): Payer: Self-pay | Admitting: Family Medicine

## 2022-01-27 ENCOUNTER — Ambulatory Visit (INDEPENDENT_AMBULATORY_CARE_PROVIDER_SITE_OTHER): Payer: PRIVATE HEALTH INSURANCE | Admitting: Family Medicine

## 2022-01-27 ENCOUNTER — Encounter (INDEPENDENT_AMBULATORY_CARE_PROVIDER_SITE_OTHER): Payer: Self-pay

## 2022-01-27 VITALS — BP 97/64 | HR 74 | Temp 97.6°F | Ht 60.0 in | Wt 129.0 lb

## 2022-01-27 DIAGNOSIS — E669 Obesity, unspecified: Secondary | ICD-10-CM | POA: Diagnosis not present

## 2022-01-27 DIAGNOSIS — E7849 Other hyperlipidemia: Secondary | ICD-10-CM | POA: Diagnosis not present

## 2022-01-27 DIAGNOSIS — E88819 Insulin resistance, unspecified: Secondary | ICD-10-CM

## 2022-01-27 DIAGNOSIS — Z6825 Body mass index (BMI) 25.0-25.9, adult: Secondary | ICD-10-CM

## 2022-01-27 MED ORDER — METFORMIN HCL 500 MG PO TABS: ORAL_TABLET | ORAL | 0 refills | Status: DC

## 2022-01-27 MED ORDER — SEMAGLUTIDE-WEIGHT MANAGEMENT 0.5 MG/0.5ML ~~LOC~~ SOAJ: 0.5000 mg | SUBCUTANEOUS | 0 refills | Status: DC

## 2022-01-27 NOTE — Telephone Encounter (Signed)
Dr. Raliegh Scarlet - Prior authorization approved for [id number]. Effective: 01/27/2022 - 07/29/2022. Patient sent approval message via mychart.

## 2022-02-24 ENCOUNTER — Ambulatory Visit (INDEPENDENT_AMBULATORY_CARE_PROVIDER_SITE_OTHER): Payer: PRIVATE HEALTH INSURANCE | Admitting: Family Medicine

## 2022-02-24 ENCOUNTER — Encounter (INDEPENDENT_AMBULATORY_CARE_PROVIDER_SITE_OTHER): Payer: Self-pay | Admitting: Family Medicine

## 2022-02-24 VITALS — BP 95/61 | HR 63 | Temp 98.1°F | Ht 60.0 in | Wt 132.0 lb

## 2022-02-24 DIAGNOSIS — E88819 Insulin resistance, unspecified: Secondary | ICD-10-CM

## 2022-02-24 DIAGNOSIS — Z6825 Body mass index (BMI) 25.0-25.9, adult: Secondary | ICD-10-CM

## 2022-02-24 DIAGNOSIS — E669 Obesity, unspecified: Secondary | ICD-10-CM | POA: Diagnosis not present

## 2022-02-24 MED ORDER — SEMAGLUTIDE-WEIGHT MANAGEMENT 0.5 MG/0.5ML ~~LOC~~ SOAJ: 0.5000 mg | SUBCUTANEOUS | 0 refills | Status: AC

## 2022-02-24 MED ORDER — METFORMIN HCL 500 MG PO TABS: ORAL_TABLET | ORAL | 0 refills | Status: DC

## 2022-02-28 NOTE — Progress Notes (Unsigned)
Chief Complaint:   OBESITY [name] is here to discuss her progress with her obesity treatment plan along with follow-up of her obesity related diagnoses. [name] is on the Category 3 Plan and states she is following her eating plan approximately 40% of the time. [name] states she is using elliptical 40 minutes 3-4 times per week.  Today's visit was #: 52 Starting weight: 171 lbs Starting date: 12/27/2019 Today's weight: 129 lbs Today's date: 01/27/2022 Total lbs lost to date: 42 Total lbs lost since last in-office visit: 0  Interim History: Pt is doing great and still exercising. She went to the fair and is happy she didn't gain. [name] mood has been great and she has no issues with meal plan. She likes coming regularly.  Subjective:   1. Other hyperlipidemia Pt has improved her LDL and HDL levels via prudent nutritional plan and increasing exercise. She continues to watch saturated and trans fats due to her history of HLD.  2. Insulin resistance Pt denies GI upset or loose stools, even if eating off plan with Glucophage or Wegovy.  Assessment/Plan:  No orders of the defined types were placed in this encounter.   Medications Discontinued During This Encounter  Medication Reason   Semaglutide-Weight Management 0.5 MG/0.5ML SOAJ Reorder   metFORMIN (GLUCOPHAGE) 500 MG tablet Reorder     Meds ordered this encounter  Medications   DISCONTD: metFORMIN (GLUCOPHAGE) 500 MG tablet    Sig: 1 po with lunch and 1 po with dinner daily    Dispense:  60 tablet    Refill:  0    30 d supply;  ** OV for RF **   Do not send RF request   DISCONTD: Semaglutide-Weight Management 0.5 MG/0.5ML SOAJ    Sig: Inject 0.5 mg into the skin once a week.    Dispense:  2 mL    Refill:  0     1. Other hyperlipidemia Cardiovascular risk and specific lipid/LDL goals reviewed.  We discussed several lifestyle modifications today and Berda will continue to work on diet, exercise and weight  loss efforts. Orders and follow up as documented in patient record.  Continue prudent nutritional plan and increase exercise. HDL is excellent at 109 two months ago. We will continue to monitor periodically.  Counseling Intensive lifestyle modifications are the first line treatment for this issue. Dietary changes: Increase soluble fiber. Decrease simple carbohydrates. Exercise changes: Moderate to vigorous-intensity aerobic activity 150 minutes per week if tolerated. Lipid-lowering medications: see documented in medical record.  2. Insulin resistance [name] will continue to work on weight loss, exercise, and decreasing simple carbohydrates to help decrease the risk of diabetes. Inas agreed to follow-up with [place] as directed to closely monitor her progress. Continue to limit simple carbs, increase proteins and fiber. Continue exercise.  3. Obesity, current BMI 25.2 [name] is currently in the action stage of change. As such, her goal is to continue with weight loss efforts. She has agreed to the Category 3 Plan.   Exercise goals:  As is  Behavioral modification strategies: increasing water intake and increasing high fiber foods.  [name] has agreed to follow-up with our clinic in 4 weeks. She was informed of the importance of frequent follow-up visits to maximize her success with intensive lifestyle modifications for her multiple health conditions.   Objective:   Blood pressure 97/64, pulse 74, temperature 97.6 F (36.4 C), height 5' (1.524 m), weight 129 lb (58.5 kg), SpO2 100 %. Body mass index is  25.19 kg/m.  General: Cooperative, alert, well developed, in no acute distress. HEENT: Conjunctivae and lids unremarkable. Cardiovascular: Regular rhythm.  Lungs: Normal work of breathing. Neurologic: No focal deficits.   Lab Results  Component Value Date   CREATININE 0.76 12/01/2021   BUN 12 12/01/2021   NA 142 12/01/2021   K 4.5 12/01/2021   CL 104 12/01/2021   CO2  20 12/01/2021   Lab Results  Component Value Date   ALT 18 12/01/2021   AST 19 12/01/2021   ALKPHOS 42 (L) 12/01/2021   BILITOT 0.3 12/01/2021   Lab Results  Component Value Date   HGBA1C 5.0 12/01/2021   HGBA1C 5.3 12/19/2019   Lab Results  Component Value Date   INSULIN 6.7 12/01/2021   INSULIN 8.6 02/25/2021   INSULIN 8.1 06/25/2020   INSULIN 8.5 01/23/2020   Lab Results  Component Value Date   TSH 0.959 12/01/2021   Lab Results  Component Value Date   CHOL 240 (H) 12/01/2021   HDL 109 12/01/2021   LDLCALC 116 (H) 12/01/2021   TRIG 89 12/01/2021   CHOLHDL 2.2 12/01/2021   Lab Results  Component Value Date   VD25OH 112.0 (H) 12/01/2021   VD25OH 56.6 08/02/2021   VD25OH 56.2 02/25/2021   Lab Results  Component Value Date   WBC 7.1 12/01/2021   HGB 12.6 12/01/2021   HCT 37.4 12/01/2021   MCV 94 12/01/2021   PLT 276 12/01/2021   Lab Results  Component Value Date   FERRITIN 31.7 10/20/2014   Attestation Statements:   Reviewed by clinician on day of visit: allergies, medications, problem list, medical history, surgical history, family history, social history, and previous encounter notes.  I, Kyung Rudd, BS, CMA, am acting as transcriptionist for [employer], DO.  I have reviewed the above documentation for accuracy and completeness, and I agree with the above. [name], D.O.  The 21st Century Cures Act was signed into law in 2016 which includes the topic of electronic health records.  This provides immediate access to information in MyChart.  This includes consultation notes, operative notes, office notes, lab results and pathology reports.  If you have any questions about what you read please let us know at your next visit so we can discuss your concerns and take corrective action if need be.  We are right here with you.

## 2022-03-03 ENCOUNTER — Telehealth (HOSPITAL_COMMUNITY): Payer: PRIVATE HEALTH INSURANCE | Admitting: Psychiatry

## 2022-03-06 NOTE — Progress Notes (Signed)
Chief Complaint:   OBESITY [name] is here to discuss her progress with her obesity treatment plan along with follow-up of her obesity related diagnoses. [name] is on the Category 3 Plan and states she is following her eating plan approximately 50% of the time. [name] states she is using elliptical 40 minutes 3 times per week.  Today's visit was #: 32 Starting weight: 171 lbs Starting date: 12/27/2019 Today's weight: 132 lbs Today's date: 02/24/2022 Total lbs lost to date: 39 Total lbs lost since last in-office visit: +3  Interim History: Pt ate out more and went to the winery and had increased alcohol intake. Pt did, however, add some exercise and did more cardio. She played outside more with her kids.  Subjective:   1. Insulin resistance Pt denies hunger and cravings and has focused on increasing protein.  Assessment/Plan:   1. Insulin resistance [name] will continue to work on weight loss, exercise, and decreasing simple carbohydrates to help decrease the risk of diabetes. [name] agreed to follow-up with [place] as directed to closely monitor her progress.  Refill- metFORMIN (GLUCOPHAGE) 500 MG tablet; 1 po with lunch and 1 po with dinner daily  Dispense: 60 tablet; Refill: 0  2. Obesity, current BMI 25.9 [name] is currently in the action stage of change. As such, her goal is to continue with weight loss efforts. She has agreed to the Category 3 Plan.   [name] has been out of [facility] for 2 weeks. Refill- Semaglutide-Weight Management 0.5 MG/0.5ML SOAJ; Inject 0.5 mg into the skin once a week.  Dispense: 2 mL; Refill: 0  Exercise goals:  As is  Behavioral modification strategies: planning for success.  [name] has agreed to follow-up with our clinic in 4 weeks. She was informed of the importance of frequent follow-up visits to maximize her success with intensive lifestyle modifications for her multiple health conditions.   Objective:   Blood pressure  95/61, pulse 63, temperature 98.1 F (36.7 C), height 5' (1.524 m), weight 132 lb (59.9 kg), SpO2 99 %. Body mass index is 25.78 kg/m.  General: Cooperative, alert, well developed, in no acute distress. HEENT: Conjunctivae and lids unremarkable. Cardiovascular: Regular rhythm.  Lungs: Normal work of breathing. Neurologic: No focal deficits.   Lab Results  Component Value Date   CREATININE 0.76 12/01/2021   BUN 12 12/01/2021   NA 142 12/01/2021   K 4.5 12/01/2021   CL 104 12/01/2021   CO2 20 12/01/2021   Lab Results  Component Value Date   ALT 18 12/01/2021   AST 19 12/01/2021   ALKPHOS 42 (L) 12/01/2021   BILITOT 0.3 12/01/2021   Lab Results  Component Value Date   HGBA1C 5.0 12/01/2021   HGBA1C 5.3 12/19/2019   Lab Results  Component Value Date   INSULIN 6.7 12/01/2021   INSULIN 8.6 02/25/2021   INSULIN 8.1 06/25/2020   INSULIN 8.5 01/23/2020   Lab Results  Component Value Date   TSH 0.959 12/01/2021   Lab Results  Component Value Date   CHOL 240 (H) 12/01/2021   HDL 109 12/01/2021   LDLCALC 116 (H) 12/01/2021   TRIG 89 12/01/2021   CHOLHDL 2.2 12/01/2021   Lab Results  Component Value Date   VD25OH 112.0 (H) 12/01/2021   VD25OH 56.6 08/02/2021   VD25OH 56.2 02/25/2021   Lab Results  Component Value Date   WBC 7.1 12/01/2021   HGB 12.6 12/01/2021   HCT 37.4 12/01/2021   MCV 94 12/01/2021  PLT 276 12/01/2021   Lab Results  Component Value Date   FERRITIN 31.7 10/20/2014   Attestation Statements:   Reviewed by clinician on day of visit: allergies, medications, problem list, medical history, surgical history, family history, social history, and previous encounter notes.  I, Kyung Rudd, BS, CMA, am acting as transcriptionist for [employer], DO.  I have reviewed the above documentation for accuracy and completeness, and I agree with the above. [name], D.O.  The 21st Century Cures Act was signed into law in 2016 which  includes the topic of electronic health records.  This provides immediate access to information in MyChart.  This includes consultation notes, operative notes, office notes, lab results and pathology reports.  If you have any questions about what you read please let us know at your next visit so we can discuss your concerns and take corrective action if need be.  We are right here with you.

## 2022-03-10 ENCOUNTER — Telehealth (HOSPITAL_BASED_OUTPATIENT_CLINIC_OR_DEPARTMENT_OTHER): Payer: PRIVATE HEALTH INSURANCE | Admitting: Psychiatry

## 2022-03-10 DIAGNOSIS — F900 Attention-deficit hyperactivity disorder, predominantly inattentive type: Secondary | ICD-10-CM | POA: Diagnosis not present

## 2022-03-10 DIAGNOSIS — F411 Generalized anxiety disorder: Secondary | ICD-10-CM

## 2022-03-10 DIAGNOSIS — F331 Major depressive disorder, recurrent, moderate: Secondary | ICD-10-CM | POA: Diagnosis not present

## 2022-03-10 DIAGNOSIS — G4700 Insomnia, unspecified: Secondary | ICD-10-CM

## 2022-03-10 MED ORDER — METHYLPHENIDATE HCL ER (OSM) 36 MG PO TBCR: 36.0000 mg | EXTENDED_RELEASE_TABLET | Freq: Every day | ORAL | 0 refills | Status: DC

## 2022-03-10 MED ORDER — BUSPIRONE HCL 30 MG PO TABS: 30.0000 mg | ORAL_TABLET | Freq: Two times a day (BID) | ORAL | 0 refills | Status: DC

## 2022-03-10 MED ORDER — BUPROPION HCL ER (XL) 300 MG PO TB24: 300.0000 mg | ORAL_TABLET | ORAL | 0 refills | Status: DC

## 2022-03-10 MED ORDER — ARIPIPRAZOLE 10 MG PO TABS: 10.0000 mg | ORAL_TABLET | Freq: Every day | ORAL | 0 refills | Status: DC

## 2022-03-10 MED ORDER — LORAZEPAM 1 MG PO TABS: ORAL_TABLET | Freq: Every day | ORAL | 2 refills | Status: DC | PRN

## 2022-03-10 NOTE — Progress Notes (Signed)
Virtual Visit via Phone Note  I connected with [name] on 03/10/22 at  8:30 AM EST by phone. We attempted to connect by  a video enabled telemedicine application several times but I could not hear her. I verified that I am speaking with the correct person using two identifiers.  Location: Patient: home Provider: office   I discussed the limitations of evaluation and management by telemedicine and the availability of in person appointments. The patient expressed understanding and agreed to proceed.  History of Present Illness: [name] is doing better. She shares that for about 2 weeks at the end of October she had a depressive episode. Her therapist thinks it had to be do relationship issues. She is working thru it in the therapy. For the last 1 week [name] has been sick. Her depression has significantly improved and she has only felt down for 1 day in the last 7. She denies anhedonia. She continues to have anxiety at times and admits thinking about work bring it on. Her sleep is good most most nights. Due to being sick her energy is lower than normal. Her appetite is good even with a decrease related to Concerta. Her ADHD is well controlled and work is going well. She likes Concerta and wants to continue it. She denies SI/HI. [name] feels she is doing well and has no concerns.    Observations/Objective: Psychiatric Specialty Exam:  General Appearance: unable to assess  Eye Contact:  unable to assess  Speech:  Clear and Coherent and Normal Rate  Volume:  Normal  Mood:  Euthymic  Affect:  Full Range  Thought Process:  Goal Directed, Linear, and Descriptions of Associations: Intact  Orientation:  Full (Time, Place, and Person)  Thought Content:  Logical  Suicidal Thoughts:  No  Homicidal Thoughts:  No  Memory:  Immediate;   Good  Judgement:  Good  Insight:  Good  Psychomotor Activity: unable to assess  Concentration:  Concentration: Good  Recall:  Good  Fund of  Knowledge:  Good  Language:  Good  Akathisia:  unable to assess  Handed:  unable to assess  AIMS (if indicated):     Assets:  Communication Skills Desire for Improvement Financial Resources/Insurance Housing Intimacy Leisure Time Physical Health Resilience Social Support Talents/Skills Transportation Vocational/Educational  ADL's:  unable to assess  Cognition:  WNL  Sleep:         Assessment and Plan:     03/10/2022    8:41 AM 12/02/2021    3:35 PM 10/21/2021    3:18 PM 07/29/2021    1:08 PM 05/06/2021   11:32 AM  Depression screen PHQ 2/9  Decreased Interest 0 1 2 0 1  Down, Depressed, Hopeless 1 1 2 1 1   PHQ - 2 Score 1 2 4 1 2   Altered sleeping  0 1  1  Tired, decreased energy  1 3  1   Change in appetite  1 0  0  Feeling bad or failure about yourself   1 1  0  Trouble concentrating  0 2  0  Moving slowly or fidgety/restless  0 0  0  Suicidal thoughts  0 0  0  PHQ-9 Score  5 11  4   Difficult doing work/chores  Somewhat difficult Very difficult  Somewhat difficult    Flowsheet Row Video Visit [facility] 03/10/20[facility] in [facility] Video Visit from 12/02/2021 in [facility] Video Visit from 10/21/2021 in [facility]  C-SSRS RISK CATEGORY No Risk No Risk No Risk          Pt is aware that these meds carry a teratogenic risk. Pt will discuss plan of action if she does or plans to become pregnant in the future.  Status of current problems: stable  Meds:  1. Major depressive disorder, recurrent episode, moderate (HCC) - ARIPiprazole (ABILIFY) 10 MG tablet; Take 1 tablet (10 mg total) by mouth daily.  Dispense: 90 tablet; Refill: 0 - buPROPion (WELLBUTRIN XL) 300 MG 24 hr tablet; Take 1 tablet (300 mg total) by mouth every morning.  Dispense: 90 tablet; Refill: 0 - busPIRone (BUSPAR) 30 MG tablet; Take 1 tablet (30 mg total) by mouth 2 (two) times daily.   Dispense: 180 tablet; Refill: 0  2. Attention deficit hyperactivity disorder (ADHD), predominantly inattentive type - buPROPion (WELLBUTRIN XL) 300 MG 24 hr tablet; Take 1 tablet (300 mg total) by mouth every morning.  Dispense: 90 tablet; Refill: 0 - methylphenidate (CONCERTA) 36 MG PO CR tablet; Take 1 tablet (36 mg total) by mouth daily.  Dispense: 30 tablet; Refill: 0 - methylphenidate (CONCERTA) 36 MG PO CR tablet; Take 1 tablet (36 mg total) by mouth daily.  Dispense: 30 tablet; Refill: 0 - methylphenidate (CONCERTA) 36 MG PO CR tablet; Take 1 tablet (36 mg total) by mouth daily.  Dispense: 30 tablet; Refill: 0  3. GAD (generalized anxiety disorder) - busPIRone (BUSPAR) 30 MG tablet; Take 1 tablet (30 mg total) by mouth 2 (two) times daily.  Dispense: 180 tablet; Refill: 0 - LORazepam (ATIVAN) 1 MG tablet; TAKE 1 TABLET BY MOUTH ONCE A DAY AS NEEDED FOR ANXIETY  Dispense: 30 tablet; Refill: 2  4. Insomnia, unspecified type - busPIRone (BUSPAR) 30 MG tablet; Take 1 tablet (30 mg total) by mouth 2 (two) times daily.  Dispense: 180 tablet; Refill: 0 - LORazepam (ATIVAN) 1 MG tablet; TAKE 1 TABLET BY MOUTH ONCE A DAY AS NEEDED FOR ANXIETY  Dispense: 30 tablet; Refill: 2     Labs: none    Therapy: brief supportive therapy provided. Discussed psychosocial stressors in detail.     Collaboration of Care: Other continue therapy  Patient/Guardian was advised Release of Information must be obtained prior to any record release in order to collaborate their care with an outside provider. Patient/Guardian was advised if they have not already done so to contact the registration department to sign all necessary forms in order for [place] to release information regarding their care.   Consent: Patient/Guardian gives verbal consent for treatment and assignment of benefits for services provided during this visit. Patient/Guardian expressed understanding and agreed to proceed.       Follow Up  Instructions: Follow up in 3 months or sooner if needed    I discussed the assessment and treatment plan with the patient. The patient was provided an opportunity to ask questions and all were answered. The patient agreed with the plan and demonstrated an understanding of the instructions.   The patient was advised to call back or seek an in-person evaluation if the symptoms worsen or if the condition fails to improve as anticipated.  I provided 13 minutes of non-face-to-face time during this encounter.   Oletta Darter, MD

## 2022-03-23 ENCOUNTER — Ambulatory Visit (INDEPENDENT_AMBULATORY_CARE_PROVIDER_SITE_OTHER): Payer: PRIVATE HEALTH INSURANCE | Admitting: Family Medicine

## 2022-04-27 ENCOUNTER — Encounter (INDEPENDENT_AMBULATORY_CARE_PROVIDER_SITE_OTHER): Payer: Self-pay | Admitting: Family Medicine

## 2022-04-27 ENCOUNTER — Ambulatory Visit (INDEPENDENT_AMBULATORY_CARE_PROVIDER_SITE_OTHER): Payer: PRIVATE HEALTH INSURANCE | Admitting: Family Medicine

## 2022-04-27 ENCOUNTER — Telehealth (INDEPENDENT_AMBULATORY_CARE_PROVIDER_SITE_OTHER): Payer: Self-pay

## 2022-04-27 VITALS — BP 110/74 | HR 64 | Temp 98.3°F | Ht 60.0 in | Wt 143.8 lb

## 2022-04-27 DIAGNOSIS — F430 Acute stress reaction: Secondary | ICD-10-CM | POA: Diagnosis not present

## 2022-04-27 DIAGNOSIS — E559 Vitamin D deficiency, unspecified: Secondary | ICD-10-CM

## 2022-04-27 DIAGNOSIS — E669 Obesity, unspecified: Secondary | ICD-10-CM | POA: Diagnosis not present

## 2022-04-27 DIAGNOSIS — Z6828 Body mass index (BMI) 28.0-28.9, adult: Secondary | ICD-10-CM

## 2022-04-27 DIAGNOSIS — Z6833 Body mass index (BMI) 33.0-33.9, adult: Secondary | ICD-10-CM

## 2022-04-27 DIAGNOSIS — E88819 Insulin resistance, unspecified: Secondary | ICD-10-CM

## 2022-04-27 MED ORDER — TIRZEPATIDE-WEIGHT MANAGEMENT 2.5 MG/0.5ML ~~LOC~~ SOAJ: 2.5000 mg | SUBCUTANEOUS | 0 refills | Status: DC

## 2022-04-27 MED ORDER — METFORMIN HCL 500 MG PO TABS: ORAL_TABLET | ORAL | 0 refills | Status: DC

## 2022-04-27 NOTE — Telephone Encounter (Signed)
Prior auth submitted thru covermymeds for zepbound.  Key: Marcello Moores

## 2022-05-13 NOTE — Progress Notes (Unsigned)
Chief Complaint:   OBESITY [name] is here to discuss her progress with her obesity treatment plan along with follow-up of her obesity related diagnoses. [name] is on the Category 3 Plan and states she is following her eating plan approximately 40% of the time. [name] states she is using her elliptical 40 minutes 3 times per week.  Today's visit was #: 45 Starting weight: 171 LBS Starting date: 12/27/2019 Today's weight: 143 LBS Today's date: 04/27/2022 Total lbs lost to date: 28 LBS Total lbs lost since last in-office visit: +11 LBS  Interim History: Patient has been on Wegovy 2.5 mg.  She engaged over the holidays and had a lot of temptations.  Positive for a lot of hunger since off Wegovy.  She never feels satisfied are full.  Subjective:   1. Vitamin D deficiency We stopped all vitamin D supplementation on 12/02/2021.  Last checked it was 112.  Patient was asymptomatic and has had not had any since.  2. Insulin resistance Patient is tolerating metformin well denies side effects.  Patient declines increase in dose at this time and prefers to get back on her GLP-1.  3. Acute reaction to stress Patient is upset today with her weight gain and is worried she will slide backwards.  Patient was very tearful today.   Assessment/Plan:  No orders of the defined types were placed in this encounter.   Medications Discontinued During This Encounter  Medication Reason   JUNEL FE 1/20 1-20 MG-MCG tablet Completed Course   metFORMIN (GLUCOPHAGE) 500 MG tablet Reorder     Meds ordered this encounter  Medications   DISCONTD: metFORMIN (GLUCOPHAGE) 500 MG tablet    Sig: 1 po with lunch and 1 po with dinner daily    Dispense:  60 tablet    Refill:  0    30 d supply;  ** OV for RF **   Do not send RF request   DISCONTD: tirzepatide (ZEPBOUND) 2.5 MG/0.5ML Pen    Sig: Inject 2.5 mg into the skin once a week.    Dispense:  2 mL    Refill:  0     1. Vitamin D  deficiency Recheck vitamin D level at next office visit.  2. Insulin resistance Continue metformin.  Track your foods to ensure adequate protein intake to help with cravings and not to be over in calories.  Continue exercise.  Refill- metFORMIN (GLUCOPHAGE) 500 MG tablet; 1 po with lunch and 1 po with dinner daily  Dispense: 60 tablet; Refill: 0  3. Acute reaction to stress Reminded patient of GLP-1 use if we discontinue the medication, research shows increase in weight.  Reminded patient that this is a lifestyle change and not a diet.  She did not fill, she will refocus her efforts on her own self-care.  Continue Wellbutrin, BuSpar and Abilify per psychiatry doctor.  Continue with counseling.  4. Obesity, current BMI 28.1 Patient will journal her calories and protein daily, but still following the category 3 as well.  Since unable to find Wegovy is back switch to Zepbound.  Risk and benefits of medications discussed with patient patient knows not to take both Wegovy and Zepbound at the same time, patient is a Therapist, sports.   Start - tirzepatide (ZEPBOUND) 2.5 MG/0.5ML Pen; Inject 2.5 mg into the skin once a week.  Dispense: 2 mL; Refill: 0  [name] is currently in the action stage of change. As such, her goal is to continue with weight loss efforts.  She has agreed to keeping a food journal and adhering to recommended goals of 1450-1550 calories and 100+ protein.   Exercise goals:  As is.   Behavioral modification strategies: planning for success and keeping a strict food journal.  [name] has agreed to follow-up with our clinic in 3-4 weeks. She was informed of the importance of frequent follow-up visits to maximize her success with intensive lifestyle modifications for her multiple health conditions.   Objective:   Blood pressure 110/74, pulse 64, temperature 98.3 F (36.8 C), height 5' (1.524 m), weight 143 lb 12.8 oz (65.2 kg), SpO2 100 %. Body mass index is 28.08 kg/m.  General:  Cooperative, alert, well developed, in no acute distress. HEENT: Conjunctivae and lids unremarkable. Cardiovascular: Regular rhythm.  Lungs: Normal work of breathing. Neurologic: No focal deficits.   Lab Results  Component Value Date   CREATININE 0.76 12/01/2021   BUN 12 12/01/2021   NA 142 12/01/2021   K 4.5 12/01/2021   CL 104 12/01/2021   CO2 20 12/01/2021   Lab Results  Component Value Date   ALT 18 12/01/2021   AST 19 12/01/2021   ALKPHOS 42 (L) 12/01/2021   BILITOT 0.3 12/01/2021   Lab Results  Component Value Date   HGBA1C 5.0 12/01/2021   HGBA1C 5.3 12/19/2019   Lab Results  Component Value Date   INSULIN 6.7 12/01/2021   INSULIN 8.6 02/25/2021   INSULIN 8.1 06/25/2020   INSULIN 8.5 01/23/2020   Lab Results  Component Value Date   TSH 0.959 12/01/2021   Lab Results  Component Value Date   CHOL 240 (H) 12/01/2021   HDL 109 12/01/2021   LDLCALC 116 (H) 12/01/2021   TRIG 89 12/01/2021   CHOLHDL 2.2 12/01/2021   Lab Results  Component Value Date   VD25OH 112.0 (H) 12/01/2021   VD25OH 56.6 08/02/2021   VD25OH 56.2 02/25/2021   Lab Results  Component Value Date   WBC 7.1 12/01/2021   HGB 12.6 12/01/2021   HCT 37.4 12/01/2021   MCV 94 12/01/2021   PLT 276 12/01/2021   Lab Results  Component Value Date   FERRITIN 31.7 10/20/2014   Attestation Statements:   Reviewed by clinician on day of visit: allergies, medications, problem list, medical history, surgical history, family history, social history, and previous encounter notes.  I, Davy Pique, RMA, am acting as Location manager for [employer], DO.   I have reviewed the above documentation for accuracy and completeness, and I agree with the above. [name], D.O.  The [place] was signed into law in 2016 which includes the topic of electronic health records.  This provides immediate access to information in MyChart.  This includes consultation notes, operative notes,  office notes, lab results and pathology reports.  If you have any questions about what you read please let us know at your next visit so we can discuss your concerns and take corrective action if need be.  We are right here with you.

## 2022-05-18 ENCOUNTER — Encounter (INDEPENDENT_AMBULATORY_CARE_PROVIDER_SITE_OTHER): Payer: Self-pay | Admitting: Family Medicine

## 2022-05-18 ENCOUNTER — Ambulatory Visit (INDEPENDENT_AMBULATORY_CARE_PROVIDER_SITE_OTHER): Payer: PRIVATE HEALTH INSURANCE | Admitting: Family Medicine

## 2022-05-18 VITALS — BP 119/71 | HR 65 | Temp 98.5°F | Ht 60.0 in | Wt 140.8 lb

## 2022-05-18 DIAGNOSIS — E88819 Insulin resistance, unspecified: Secondary | ICD-10-CM

## 2022-05-18 DIAGNOSIS — Z6827 Body mass index (BMI) 27.0-27.9, adult: Secondary | ICD-10-CM | POA: Diagnosis not present

## 2022-05-18 DIAGNOSIS — E669 Obesity, unspecified: Secondary | ICD-10-CM

## 2022-05-18 MED ORDER — WEGOVY 0.5 MG/0.5ML ~~LOC~~ SOAJ: 0.5000 mg | SUBCUTANEOUS | 0 refills | Status: DC

## 2022-05-18 MED ORDER — METFORMIN HCL 500 MG PO TABS: ORAL_TABLET | ORAL | 0 refills | Status: DC

## 2022-06-02 ENCOUNTER — Telehealth (HOSPITAL_COMMUNITY): Payer: PRIVATE HEALTH INSURANCE | Admitting: Psychiatry

## 2022-06-06 NOTE — Progress Notes (Unsigned)
Chief Complaint:   OBESITY [name] is here to discuss her progress with her obesity treatment plan along with follow-up of her obesity related diagnoses. [name] is on keeping a food journal and adhering to recommended goals of 1450-1550 calories and 80 protein and states she is following her eating plan approximately 80% of the time. [name] states she is using her elliptical 40 minutes 3 times per week.  Today's visit was #: 28 Starting weight: 171 lbs Starting date: 12/27/2019 Today's weight: 140 lbs Today's date: 05/18/2022 Total lbs lost to date: 31 lbs Total lbs lost since last in-office visit: 3 lbs  Interim History: Zepbound was not approved by her insurance.  She requests refill on Wegovy.  She has gotten back on plan.  Subjective:   1. Insulin resistance Patient has not carb cravings lately.  Due to eating off plan more and not getting her protein in. She is tolerating metformin well, no side effects.   Assessment/Plan:  No orders of the defined types were placed in this encounter.   Medications Discontinued During This Encounter  Medication Reason   tirzepatide (ZEPBOUND) 2.5 MG/0.5ML Pen Not covered by the pt's insurance   metFORMIN (GLUCOPHAGE) 500 MG tablet Reorder     Meds ordered this encounter  Medications   metFORMIN (GLUCOPHAGE) 500 MG tablet    Sig: 1 po with lunch and 1 po with dinner daily    Dispense:  60 tablet    Refill:  0    30 d supply;  ** OV for RF **   Do not send RF request   Semaglutide-Weight Management (WEGOVY) 0.5 MG/0.5ML SOAJ    Sig: Inject 0.5 mg into the skin once a week.    Dispense:  2 mL    Refill:  0     1. Insulin resistance Symptoms improving patient requests refill on metformin.  Changed back to [facility].  With getting back on plan her cravings and hunger is improving she does not want to change dose at this time.  Refill- metFORMIN (GLUCOPHAGE) 500 MG tablet; 1 po with lunch and 1 po with dinner daily  Dispense:  60 tablet; Refill: 0  2. Obesity, current BMI 27.5 We will refill Wegovy 0.5 mg weekly and discontinue Zepbound.  Continue PNP and increase exercise by continuing back at the gym.  Add strength training at least 2 days/week.  Refill- Semaglutide-Weight Management (WEGOVY) 0.5 MG/0.5ML SOAJ; Inject 0.5 mg into the skin once a week.  Dispense: 2 mL; Refill: 0  [name] is currently in the action stage of change. As such, her goal is to continue with weight loss efforts. She has agreed to keeping a food journal and adhering to recommended goals of 1450-1550 calories and 100+ protein daily.    Exercise goals:  add strength training.   Behavioral modification strategies: meal planning and cooking strategies and planning for success.  [name] has agreed to follow-up with our clinic in 4 weeks. She was informed of the importance of frequent follow-up visits to maximize her success with intensive lifestyle modifications for her multiple health conditions.   Objective:   Blood pressure 119/71, pulse 65, temperature 98.5 F (36.9 C), height 5' (1.524 m), weight 140 lb 12.8 oz (63.9 kg), SpO2 99 %. Body mass index is 27.5 kg/m.  General: Cooperative, alert, well developed, in no acute distress. HEENT: Conjunctivae and lids unremarkable. Cardiovascular: Regular rhythm.  Lungs: Normal work of breathing. Neurologic: No focal deficits.   Lab Results  Component  Value Date   CREATININE 0.76 12/01/2021   BUN 12 12/01/2021   NA 142 12/01/2021   K 4.5 12/01/2021   CL 104 12/01/2021   CO2 20 12/01/2021   Lab Results  Component Value Date   ALT 18 12/01/2021   AST 19 12/01/2021   ALKPHOS 42 (L) 12/01/2021   BILITOT 0.3 12/01/2021   Lab Results  Component Value Date   HGBA1C 5.0 12/01/2021   HGBA1C 5.3 12/19/2019   Lab Results  Component Value Date   INSULIN 6.7 12/01/2021   INSULIN 8.6 02/25/2021   INSULIN 8.1 06/25/2020   INSULIN 8.5 01/23/2020   Lab Results  Component Value  Date   TSH 0.959 12/01/2021   Lab Results  Component Value Date   CHOL 240 (H) 12/01/2021   HDL 109 12/01/2021   LDLCALC 116 (H) 12/01/2021   TRIG 89 12/01/2021   CHOLHDL 2.2 12/01/2021   Lab Results  Component Value Date   VD25OH 112.0 (H) 12/01/2021   VD25OH 56.6 08/02/2021   VD25OH 56.2 02/25/2021   Lab Results  Component Value Date   WBC 7.1 12/01/2021   HGB 12.6 12/01/2021   HCT 37.4 12/01/2021   MCV 94 12/01/2021   PLT 276 12/01/2021   Lab Results  Component Value Date   FERRITIN 31.7 10/20/2014   Attestation Statements:   Reviewed by clinician on day of visit: allergies, medications, problem list, medical history, surgical history, family history, social history, and previous encounter notes.  I, Davy Pique, RMA, am acting as Location manager for [employer], DO.   I have reviewed the above documentation for accuracy and completeness, and I agree with the above. [name], D.O.  The [place] was signed into law in 2016 which includes the topic of electronic health records.  This provides immediate access to information in MyChart.  This includes consultation notes, operative notes, office notes, lab results and pathology reports.  If you have any questions about what you read please let us know at your next visit so we can discuss your concerns and take corrective action if need be.  We are right here with you.

## 2022-06-07 ENCOUNTER — Ambulatory Visit (INDEPENDENT_AMBULATORY_CARE_PROVIDER_SITE_OTHER): Payer: PRIVATE HEALTH INSURANCE | Admitting: Family Medicine

## 2022-06-15 ENCOUNTER — Ambulatory Visit (INDEPENDENT_AMBULATORY_CARE_PROVIDER_SITE_OTHER): Payer: PRIVATE HEALTH INSURANCE | Admitting: Family Medicine

## 2022-06-15 ENCOUNTER — Encounter (INDEPENDENT_AMBULATORY_CARE_PROVIDER_SITE_OTHER): Payer: Self-pay | Admitting: Family Medicine

## 2022-06-15 VITALS — BP 106/72 | HR 77 | Temp 98.2°F | Ht 60.0 in | Wt 143.2 lb

## 2022-06-15 DIAGNOSIS — E669 Obesity, unspecified: Secondary | ICD-10-CM | POA: Diagnosis not present

## 2022-06-15 DIAGNOSIS — Z8639 Personal history of other endocrine, nutritional and metabolic disease: Secondary | ICD-10-CM

## 2022-06-15 DIAGNOSIS — E88819 Insulin resistance, unspecified: Secondary | ICD-10-CM

## 2022-06-15 DIAGNOSIS — E7849 Other hyperlipidemia: Secondary | ICD-10-CM

## 2022-06-15 DIAGNOSIS — Z6828 Body mass index (BMI) 28.0-28.9, adult: Secondary | ICD-10-CM | POA: Diagnosis not present

## 2022-06-15 HISTORY — DX: Personal history of other endocrine, nutritional and metabolic disease: Z86.39

## 2022-06-15 MED ORDER — WEGOVY 0.5 MG/0.5ML ~~LOC~~ SOAJ: 0.5000 mg | SUBCUTANEOUS | 0 refills | Status: DC

## 2022-06-15 MED ORDER — METFORMIN HCL 500 MG PO TABS: ORAL_TABLET | ORAL | 0 refills | Status: DC

## 2022-06-16 ENCOUNTER — Ambulatory Visit (INDEPENDENT_AMBULATORY_CARE_PROVIDER_SITE_OTHER): Payer: PRIVATE HEALTH INSURANCE | Admitting: Family Medicine

## 2022-06-16 ENCOUNTER — Telehealth (HOSPITAL_COMMUNITY): Payer: PRIVATE HEALTH INSURANCE | Admitting: Psychiatry

## 2022-06-16 DIAGNOSIS — G4700 Insomnia, unspecified: Secondary | ICD-10-CM | POA: Diagnosis not present

## 2022-06-16 DIAGNOSIS — F900 Attention-deficit hyperactivity disorder, predominantly inattentive type: Secondary | ICD-10-CM | POA: Diagnosis not present

## 2022-06-16 DIAGNOSIS — F331 Major depressive disorder, recurrent, moderate: Secondary | ICD-10-CM | POA: Diagnosis not present

## 2022-06-16 DIAGNOSIS — F411 Generalized anxiety disorder: Secondary | ICD-10-CM

## 2022-06-16 LAB — COMPREHENSIVE METABOLIC PANEL
ALT: 21 IU/L (ref 0–32)
AST: 27 IU/L (ref 0–40)
Albumin/Globulin Ratio: 2 (ref 1.2–2.2)
Albumin: 4.3 g/dL (ref 3.9–4.9)
Alkaline Phosphatase: 51 IU/L (ref 44–121)
BUN/Creatinine Ratio: 19 (ref 9–23)
BUN: 15 mg/dL (ref 6–20)
Bilirubin Total: <0.2 mg/dL (ref 0.0–1.2)
CO2: 20 mmol/L (ref 20–29)
Calcium: 9.3 mg/dL (ref 8.7–10.2)
Chloride: 102 mmol/L (ref 96–106)
Creatinine, Ser: 0.8 mg/dL (ref 0.57–1.00)
Globulin, Total: 2.2 g/dL (ref 1.5–4.5)
Glucose: 90 mg/dL (ref 70–99)
Potassium: 4.3 mmol/L (ref 3.5–5.2)
Sodium: 140 mmol/L (ref 134–144)
Total Protein: 6.5 g/dL (ref 6.0–8.5)
eGFR: 98 mL/min/{1.73_m2} (ref >59)

## 2022-06-16 LAB — VITAMIN D 25 HYDROXY (VIT D DEFICIENCY, FRACTURES): Vit D, 25-Hydroxy: 28.8 ng/mL — ABNORMAL LOW (ref 30.0–100.0)

## 2022-06-16 LAB — LIPID PANEL WITH LDL/HDL RATIO
Cholesterol, Total: 258 mg/dL — ABNORMAL HIGH (ref 100–199)
HDL: 141 mg/dL (ref >39)
LDL Chol Calc (NIH): 100 mg/dL — ABNORMAL HIGH (ref 0–99)
LDL/HDL Ratio: 0.7 ratio (ref 0.0–3.2)
Triglycerides: 103 mg/dL (ref 0–149)
VLDL Cholesterol Cal: 17 mg/dL (ref 5–40)

## 2022-06-16 LAB — HEMOGLOBIN A1C
Est. average glucose Bld gHb Est-mCnc: 105 mg/dL
Hgb A1c MFr Bld: 5.3 % (ref 4.8–5.6)

## 2022-06-16 LAB — INSULIN, RANDOM: INSULIN: 8.1 u[IU]/mL (ref 2.6–24.9)

## 2022-06-16 MED ORDER — BUPROPION HCL ER (XL) 300 MG PO TB24
300.0000 mg | ORAL_TABLET | ORAL | 11 refills | Status: DC
  Filled 2022-12-27: qty 30, 30d supply, fill #0

## 2022-06-16 MED ORDER — METHYLPHENIDATE HCL ER (OSM) 36 MG PO TBCR: 36.0000 mg | EXTENDED_RELEASE_TABLET | Freq: Every day | ORAL | 0 refills | Status: DC

## 2022-06-16 MED ORDER — ARIPIPRAZOLE 15 MG PO TABS: 15.0000 mg | ORAL_TABLET | Freq: Every day | ORAL | 0 refills | Status: DC

## 2022-06-16 MED ORDER — BUSPIRONE HCL 30 MG PO TABS
30.0000 mg | ORAL_TABLET | Freq: Two times a day (BID) | ORAL | 3 refills | Status: DC
  Filled 2023-03-10: qty 60, 30d supply, fill #0
  Filled 2023-06-19: qty 60, 30d supply, fill #1

## 2022-06-16 MED ORDER — LORAZEPAM 1 MG PO TABS: ORAL_TABLET | Freq: Every day | ORAL | 2 refills | Status: AC | PRN

## 2022-06-16 NOTE — Progress Notes (Signed)
Virtual Visit via Video Note  I connected with [name] on 06/16/22 at  1:15 PM EST by  a video enabled telemedicine application and verified that I am speaking with the correct person using two identifiers.  Location: Patient: Home Provider: office   I discussed the limitations of evaluation and management by telemedicine and the availability of in person appointments. The patient expressed understanding and agreed to proceed.  History of Present Illness: "They have been ok". [name] is doing fine and home life is good. She is having increased anxiety lately but this week was a little better. This week she has been sick so she hasn't done much. Her anxiety is increased at work and in social situations. At work she worries about making a mistake, getting things done and dealing with new patients. In social situations she gets anxious about all she is saying and doing and it ends up leading to her shutting down. She obsesses about the things she said and did. At home the anxiety is manageable and consists of racing thoughts about chores and relationships. She has tried breathing exercises but it doesn't help. [name] also tries mindfulness and reality testing but it doesn't help.  She will only take Ativan at home and that helps. The depression is stable. The last time she felt depressed was last week for 1 day. She is endorsing some anhedonia. She doesn't really do much by herself. Her sleep and appetite are good. Her energy is on the low side. She denies SI/HI. Her ADHD is well controlled with her meds.    Observations/Objective: Psychiatric Specialty Exam: ROS  There were no vitals taken for this visit.There is no height or weight on file to calculate BMI.  General Appearance: Casual and Fairly Groomed  Eye Contact:  Good  Speech:  Clear and Coherent and Normal Rate  Volume:  Normal  Mood:  Anxious and Depressed  Affect:  Full Range  Thought Process:  Goal Directed, Linear, and  Descriptions of Associations: Intact  Orientation:  Full (Time, Place, and Person)  Thought Content:  Logical  Suicidal Thoughts:  No  Homicidal Thoughts:  No  Memory:  Immediate;   Good  Judgement:  Good  Insight:  Good  Psychomotor Activity:  Normal  Concentration:  Concentration: Good  Recall:  Good  Fund of Knowledge:  Good  Language:  Good  Akathisia:  No  Handed:  Right  AIMS (if indicated):     Assets:  Communication Skills Desire for Improvement Financial Resources/Insurance Housing Intimacy Leisure Time Resilience Social Support Talents/Skills Transportation Vocational/Educational  ADL's:  Intact  Cognition:  WNL  Sleep:        Assessment and Plan:     06/16/2022    1:22 PM 03/10/2022    8:41 AM 12/02/2021    3:35 PM 10/21/2021    3:18 PM 07/29/2021    1:08 PM  Depression screen PHQ 2/9  Decreased Interest 1 0 1 2 0  Down, Depressed, Hopeless 0 1 1 2 1  $ PHQ - 2 Score 1 1 2 4 1  $ Altered sleeping   0 1   Tired, decreased energy   1 3   Change in appetite   1 0   Feeling bad or failure about yourself    1 1   Trouble concentrating   0 2   Moving slowly or fidgety/restless   0 0   Suicidal thoughts   0 0   PHQ-9 Score   5 11  Difficult doing work/chores   Somewhat difficult Very difficult     Flowsheet Row Video Visit from 06/16/2022 in [facility] Video Visit from 03/10/2022 in [facility] Video Visit from 12/02/2021 in [facility] No Risk No Risk No Risk          Pt is aware that these meds carry a teratogenic risk. Pt will discuss plan of action if she does or plans to become pregnant in the future.  Status of current problems: worsening anxiety, ongoing depression symptoms.    Medication management with supportive therapy. Risks and benefits, side effects and alternative treatment options discussed with  patient. Pt was given an opportunity to ask questions about medication, illness, and treatment. All current psychiatric medications have been reviewed and discussed with the patient and adjusted as clinically appropriate.  Pt verbalized understanding and verbal consent obtained for treatment.  Meds: increase Abilify to 12m qD for anxiety 1. GAD (generalized anxiety disorder) - busPIRone (BUSPAR) 30 MG tablet; Take 1 tablet (30 mg total) by mouth 2 (two) times daily.  Dispense: 180 tablet; Refill: 0 - LORazepam (ATIVAN) 1 MG tablet; TAKE 1 TABLET BY MOUTH ONCE A DAY AS NEEDED FOR ANXIETY  Dispense: 30 tablet; Refill: 2  2. Major depressive disorder, recurrent episode, moderate (HCC) - buPROPion (WELLBUTRIN XL) 300 MG 24 hr tablet; Take 1 tablet (300 mg total) by mouth every morning.  Dispense: 90 tablet; Refill: 0 - busPIRone (BUSPAR) 30 MG tablet; Take 1 tablet (30 mg total) by mouth 2 (two) times daily.  Dispense: 180 tablet; Refill: 0 - ARIPiprazole (ABILIFY) 15 MG tablet; Take 1 tablet (15 mg total) by mouth daily.  Dispense: 90 tablet; Refill: 0  3. Attention deficit hyperactivity disorder (ADHD), predominantly inattentive type - buPROPion (WELLBUTRIN XL) 300 MG 24 hr tablet; Take 1 tablet (300 mg total) by mouth every morning.  Dispense: 90 tablet; Refill: 0 - methylphenidate (CONCERTA) 36 MG PO CR tablet; Take 1 tablet (36 mg total) by mouth daily.  Dispense: 30 tablet; Refill: 0 - methylphenidate (CONCERTA) 36 MG PO CR tablet; Take 1 tablet (36 mg total) by mouth daily.  Dispense: 30 tablet; Refill: 0 - methylphenidate (CONCERTA) 36 MG PO CR tablet; Take 1 tablet (36 mg total) by mouth daily.  Dispense: 30 tablet; Refill: 0  4. Insomnia, unspecified type - busPIRone (BUSPAR) 30 MG tablet; Take 1 tablet (30 mg total) by mouth 2 (two) times daily.  Dispense: 180 tablet; Refill: 0 - LORazepam (ATIVAN) 1 MG tablet; TAKE 1 TABLET BY MOUTH ONCE A DAY AS NEEDED FOR ANXIETY  Dispense: 30 tablet;  Refill: 2     Labs: asked to get EKG she will contact PCP 06/15/22: CMP, cholesterol elevated, HbA1c WNL,   12/01/21: TSH wnl, CBC wnl  Therapy: brief supportive therapy provided. Discussed psychosocial stressors in detail.      Collaboration of Care: Referral or follow-up with counselor/therapist AEB q2 weeks  Patient/Guardian was advised Release of Information must be obtained prior to any record release in order to collaborate their care with an outside provider. Patient/Guardian was advised if they have not already done so to contact the registration department to sign all necessary forms in order for [facility] release information regarding their care.   Consent: Patient/Guardian gives verbal consent for treatment and assignment of benefits for services provided during this visit. Patient/Guardian expressed understanding and agreed to proceed.  Follow Up Instructions: Follow up in 2-3 months or sooner if needed    I discussed the assessment and treatment plan with the patient. The patient was provided an opportunity to ask questions and all were answered. The patient agreed with the plan and demonstrated an understanding of the instructions.   The patient was advised to call back or seek an in-person evaluation if the symptoms worsen or if the condition fails to improve as anticipated.  I provided 13 minutes of non-face-to-face time during this encounter.   Charlcie Cradle, MD

## 2022-06-28 NOTE — Progress Notes (Unsigned)
Chief Complaint:   OBESITY [name] is here to discuss her progress with her obesity treatment plan along with follow-up of her obesity related diagnoses. [name] is on keeping a food journal and adhering to recommended goals of 1450-1550 calories and 100+ protein and states she is following her eating plan approximately 75% of the time. [name] states she is elliptical 40 minutes 3 times per week.  Today's visit was #: 37 Starting weight: 171 LBS Starting date: 12/27/2019 Today's weight: 143 LBS Today's date: 06/15/2022 Total lbs lost to date: 28 LBS Total lbs lost since last in-office visit: +3 LBS  Interim History: Patient went out of town and ate poorly and then she was sick 5 to 7 days and cannot work out or [employer] or food prep much.  Subjective:   1. Insulin resistance Patient is out of [place] since October 2023.  2. History of vitamin D deficiency Patient has been off all supplements for many months now since losing so much weight.  3. Other hyperlipidemia LDL 115, HDL 109, triglycerides 84.  Patient is not taking any medications.  Assessment/Plan:   1. Insulin resistance Check labs today.  Refill- metFORMIN (GLUCOPHAGE) 500 MG tablet; 1 po with lunch and 1 po with dinner daily  Dispense: 60 tablet; Refill: 0  - Hemoglobin A1c - Insulin, random  2. History of vitamin D deficiency Vitamin D level last checked in August 2023 at 112.0.  Check labs today.  - VITAMIN D 25 Hydroxy (Vit-D Deficiency, Fractures)  3. Other hyperlipidemia Check labs today.  Continue PNP.  4. Obesity, current BMI 28.0(start bmi 33.40) Continue journaling, but use log to write it down.  - Comprehensive metabolic panel - Lipid Panel With LDL/HDL Ratio  Refill- Semaglutide-Weight Management (WEGOVY) 0.5 MG/0.5ML SOAJ; Inject 0.5 mg into the skin once a week.  Dispense: 2 mL; Refill: 0  [name] is currently in the action stage of change. As such, her goal is to continue with  weight loss efforts. She has agreed to keeping a food journal and adhering to recommended goals of 1450-1550 calories and 100 protein.   Exercise goals:  As is.  Behavioral modification strategies: decreasing eating out and keeping a strict food journal.  [name] has agreed to follow-up with our clinic in 4 weeks. She was informed of the importance of frequent follow-up visits to maximize her success with intensive lifestyle modifications for her multiple health conditions.   [name] was informed we would discuss her lab results at her next visit unless there is a critical issue that needs to be addressed sooner. [name] agreed to keep her next visit at the agreed upon time to discuss these results.  Objective:   Blood pressure 106/72, pulse 77, temperature 98.2 F (36.8 C), height 5' (1.524 m), weight 143 lb 3.2 oz (65 kg), SpO2 100 %. Body mass index is 27.97 kg/m.  General: Cooperative, alert, well developed, in no acute distress. HEENT: Conjunctivae and lids unremarkable. Cardiovascular: Regular rhythm.  Lungs: Normal work of breathing. Neurologic: No focal deficits.   Lab Results  Component Value Date   CREATININE 0.80 06/15/2022   BUN 15 06/15/2022   NA 140 06/15/2022   K 4.3 06/15/2022   CL 102 06/15/2022   CO2 20 06/15/2022   Lab Results  Component Value Date   ALT 21 06/15/2022   AST 27 06/15/2022   ALKPHOS 51 06/15/2022   BILITOT <0.2 06/15/2022   Lab Results  Component Value Date   HGBA1C 5.3  06/15/2022   HGBA1C 5.0 12/01/2021   HGBA1C 5.3 12/19/2019   Lab Results  Component Value Date   INSULIN 8.1 06/15/2022   INSULIN 6.7 12/01/2021   INSULIN 8.6 02/25/2021   INSULIN 8.1 06/25/2020   INSULIN 8.5 01/23/2020   Lab Results  Component Value Date   TSH 0.959 12/01/2021   Lab Results  Component Value Date   CHOL 258 (H) 06/15/2022   HDL 141 06/15/2022   LDLCALC 100 (H) 06/15/2022   TRIG 103 06/15/2022   CHOLHDL 2.2 12/01/2021   Lab Results   Component Value Date   VD25OH 28.8 (L) 06/15/2022   VD25OH 112.0 (H) 12/01/2021   VD25OH 56.6 08/02/2021   Lab Results  Component Value Date   WBC 7.1 12/01/2021   HGB 12.6 12/01/2021   HCT 37.4 12/01/2021   MCV 94 12/01/2021   PLT 276 12/01/2021   Lab Results  Component Value Date   FERRITIN 31.7 10/20/2014   Attestation Statements:   Reviewed by clinician on day of visit: allergies, medications, problem list, medical history, surgical history, family history, social history, and previous encounter notes.  I, Davy Pique, RMA, am acting as Location manager for [employer], DO.  I have reviewed the above documentation for accuracy and completeness, and I agree with the above. -  ***

## 2022-07-14 ENCOUNTER — Ambulatory Visit (INDEPENDENT_AMBULATORY_CARE_PROVIDER_SITE_OTHER): Payer: PRIVATE HEALTH INSURANCE | Admitting: Family Medicine

## 2022-07-14 ENCOUNTER — Encounter (INDEPENDENT_AMBULATORY_CARE_PROVIDER_SITE_OTHER): Payer: Self-pay | Admitting: Family Medicine

## 2022-07-14 VITALS — BP 109/60 | HR 72 | Temp 98.0°F | Ht 60.0 in | Wt 144.8 lb

## 2022-07-14 DIAGNOSIS — E7849 Other hyperlipidemia: Secondary | ICD-10-CM | POA: Diagnosis not present

## 2022-07-14 DIAGNOSIS — E88819 Insulin resistance, unspecified: Secondary | ICD-10-CM

## 2022-07-14 DIAGNOSIS — E669 Obesity, unspecified: Secondary | ICD-10-CM

## 2022-07-14 DIAGNOSIS — E559 Vitamin D deficiency, unspecified: Secondary | ICD-10-CM

## 2022-07-14 DIAGNOSIS — Z6828 Body mass index (BMI) 28.0-28.9, adult: Secondary | ICD-10-CM

## 2022-07-14 MED ORDER — VITAMIN D (ERGOCALCIFEROL) 1.25 MG (50000 UNIT) PO CAPS: ORAL_CAPSULE | ORAL | 0 refills | Status: DC

## 2022-07-14 MED ORDER — METFORMIN HCL 500 MG PO TABS: ORAL_TABLET | ORAL | 0 refills | Status: DC

## 2022-07-14 NOTE — Progress Notes (Signed)
[name], D.O.  ABFM, ABOM Specializing in Clinical Bariatric Medicine  Office located at: [address], [place]  [address]     Assessment and Plan:   No orders of the defined types were placed in this encounter.   Medications Discontinued During This Encounter  Medication Reason   metFORMIN (GLUCOPHAGE) 500 MG tablet Reorder     Meds ordered this encounter  Medications   metFORMIN (GLUCOPHAGE) 500 MG tablet    Sig: 1 po with lunch and 2 po with dinner daily    Dispense:  90 tablet    Refill:  0    30 d supply;  ** OV for RF **   Do not send RF request   Vitamin D, Ergocalciferol, (DRISDOL) 1.25 MG (50000 UNIT) CAPS capsule    Sig: 1 po q 14 days    Dispense:  6 capsule    Refill:  0     Insulin resistance Assessment: Condition is Controlled. Labs were reviewed.   Lab Results  Component Value Date   HGBA1C 5.3 06/15/2022   HGBA1C 5.0 12/01/2021   HGBA1C 5.3 12/19/2019   INSULIN 8.1 06/15/2022   INSULIN 6.7 12/01/2021   INSULIN 8.6 02/25/2021  She has been compliant with Metformin 500 mg daily. However, she reports having hunger and cravings at night.   Plan: Increase Metformin 500 mg  to 1 po with lunch and 2 po with dinner daily - Zipporah will also continue to work on weight loss, exercise, via their meal plan we devised to help decrease the risk of progressing to diabetes.    Other hyperlipidemia Assessment: Condition is at goal. Labs were reviewed.  Lab Results  Component Value Date   CHOL 258 (H) 06/15/2022   HDL 141 06/15/2022   LDLCALC 100 (H) 06/15/2022   TRIG 103 06/15/2022   CHOLHDL 2.2 12/01/2021  Her HDL has increased because of her exercise regiment and LDL levels are normal due to her dietary changes  Plan: Continue her prudent nutritional plan and continue to advance exercise and cardiovascular fitness as tolerated.  - I stressed the importance that patient continue with our prudent nutritional plan that is low in  saturated and trans fats, and low in fatty carbs to improve these numbers.   - We will continue routine screening as patient continues to achieve health goals along their weight loss journey Last lipid panel as above.     History of vitamin D deficiency Assessment: Condition is not at goal Labs were reviewed.  Lab Results  Component Value Date   VD25OH 28.8 (L) 06/15/2022   VD25OH 112.0 (H) 12/01/2021   VD25OH 56.6 08/02/2021  On 12/02/21, she was stopped on her Vitamin D weekly because it elevated to 112. However, after she stopped the Vitamin D, her levels went down to 28.8.   Plan: Restart Ergocalciferol 50K IU every 14 days - weight loss will likely improve availability of vitamin D, thus encouraged [name] to continue with meal plan and their weight loss efforts to further improve this condition.  Thus, we will need to monitor levels regularly (every 3-4 mo on average) to keep levels within normal limits and prevent over supplementation. - pt's questions and concerns regarding this condition addressed.     TREATMENT PLAN FOR OBESITY: Obesity, current BMI 28.3 (start bmi 33.40 date 01/23/20) Assessment: Condition is Controlled.. Biometric data collected today, was reviewed with patient.  Fat mass has decreased by 1.4lb. Muscle mass has increased by  2.8lb. Total body water has increased by .6lb. She has not taken [employer] since October because she has not received it. Her insurance did not cover Zepbound.   Plan: Continue with keeping a food journal and adhering to recommended goals of 1450-1550 calories and 100 protein with Category 3 plan as a guide. She will continue trying to obtain her [facility].    Behavioral Intervention Additional resources provided today: patient declined Evidence-based interventions for health behavior change were utilized today including the discussion of self monitoring techniques, problem-solving barriers and SMART goal setting techniques.   Regarding  patient's less desirable eating habits and patterns, we employed the technique of small changes.  Pt will specifically work on: continuing with food journaling and exercising for next visit.    Recommended Physical Activity Goals [name] has been advised to work up to 150 minutes of moderate intensity aerobic activity a week and strengthening exercises 2-3 times per week for cardiovascular health, weight loss maintenance and preservation of muscle mass.  She has agreed to Continue current level of physical activity    FOLLOW UP: Return in about 4 weeks (around 08/11/2022). She was informed of the importance of frequent follow up visits to maximize her success with intensive lifestyle modifications for her multiple health conditions.  Subjective:   Chief complaint: Obesity Meriam is here to discuss her progress with her obesity treatment plan. She is on the keeping a food journal and adhering to recommended goals of 1450-1550 calories and 100 protein with Category 3 plan as a guide and states she is following her eating plan approximately 75% of the time. She states she is exercising 40 minutes 3 days per week .   Interval History:  [name] is here for a follow up office visit. Since last visit she has been mostly journaling and following the meal plan, but on one recent weekend she went out drinking and ate high caloric food. However, after that weekend, she returned back to journaling and exercising.    Review of Systems:  Pertinent positives were addressed with patient today.  Weight Summary and Biometrics   Weight Lost Since Last Visit: 0  Weight Gained Since Last Visit: 1lb    Vitals Temp: 98 F (36.7 C) BP: 109/60 Pulse Rate: 72 SpO2: 99 %   Anthropometric Measurements Height: 5' (1.524 m) Weight: 144 lb 12.8 oz (65.7 kg) BMI (Calculated): 28.28 Weight at Last Visit: 143lb Weight Lost Since Last Visit: 0 Weight Gained Since Last Visit: 1lb Starting  Weight: 171lb Total Weight Loss (lbs): 27 lb (12.2 kg) Peak Weight: 171lb   Body Composition  Body Fat %: 29.8 % Fat Mass (lbs): 43.2 lbs Muscle Mass (lbs): 96.4 lbs Total Body Water (lbs): 67.2 lbs Visceral Fat Rating : 5   Other Clinical Data Fasting: no Labs: no Today's Visit #: 36 Starting Date: 01/23/20    Objective:   PHYSICAL EXAM:  Blood pressure 109/60, pulse 72, temperature 98 F (36.7 C), height 5' (1.524 m), weight 144 lb 12.8 oz (65.7 kg), SpO2 99 %. Body mass index is 28.28 kg/m.  General: Well Developed, well nourished, and in no acute distress.  HEENT: Normocephalic, atraumatic Skin: Warm and dry, cap RF less 2 sec, good turgor Chest:  Normal excursion, shape, no gross abn Respiratory: speaking in full sentences, no conversational dyspnea NeuroM-Sk: Ambulates w/o assistance, moves * 4 Psych: A and O *3, insight good, mood-full  DIAGNOSTIC DATA REVIEWED:  BMET    Component Value Date/Time  NA 140 06/15/2022 0840   K 4.3 06/15/2022 0840   CL 102 06/15/2022 0840   CO2 20 06/15/2022 0840   GLUCOSE 90 06/15/2022 0840   GLUCOSE 94 12/19/2019 0946   BUN 15 06/15/2022 0840   CREATININE 0.80 06/15/2022 0840   CREATININE 0.87 12/19/2019 0946   CALCIUM 9.3 06/15/2022 0840   GFRNONAA >60 11/01/2015 1705   GFRAA >60 11/01/2015 1705   Lab Results  Component Value Date   HGBA1C 5.3 06/15/2022   HGBA1C 5.3 12/19/2019   Lab Results  Component Value Date   INSULIN 8.1 06/15/2022   INSULIN 8.5 01/23/2020   Lab Results  Component Value Date   TSH 0.959 12/01/2021   CBC    Component Value Date/Time   WBC 7.1 12/01/2021 1250   WBC 8.8 12/19/2019 0946   RBC 3.96 12/01/2021 1250   RBC 4.17 12/19/2019 0946   HGB 12.6 12/01/2021 1250   HCT 37.4 12/01/2021 1250   PLT 276 12/01/2021 1250   MCV 94 12/01/2021 1250   MCH 31.8 12/01/2021 1250   MCH 30.7 12/19/2019 0946   MCHC 33.7 12/01/2021 1250   MCHC 33.2 12/19/2019 0946   RDW 12.1 12/01/2021  1250   Iron Studies    Component Value Date/Time   FERRITIN 31.7 10/20/2014 1657   Lipid Panel     Component Value Date/Time   CHOL 258 (H) 06/15/2022 0840   TRIG 103 06/15/2022 0840   HDL 141 06/15/2022 0840   CHOLHDL 2.2 12/01/2021 1250   LDLCALC 100 (H) 06/15/2022 0840   Hepatic Function Panel     Component Value Date/Time   PROT 6.5 06/15/2022 0840   ALBUMIN 4.3 06/15/2022 0840   AST 27 06/15/2022 0840   ALT 21 06/15/2022 0840   ALKPHOS 51 06/15/2022 0840   BILITOT <0.2 06/15/2022 0840   BILIDIR 0.0 12/19/2019 0946   IBILI 0.3 12/19/2019 0946      Component Value Date/Time   TSH 0.959 12/01/2021 1250   Nutritional Lab Results  Component Value Date   VD25OH 28.8 (L) 06/15/2022   VD25OH 112.0 (H) 12/01/2021   VD25OH 56.6 08/02/2021    Attestations:   Reviewed by clinician on day of visit: allergies, medications, problem list, medical history, surgical history, family history, social history, and previous encounter notes.   I,Special Puri,acting as a Education administrator for [employer], DO.,have documented all relevant documentation on the behalf of Mellody Dance, DO,as directed by  Mellody Dance, DO while in the presence of Mellody Dance, DO.   I, Mellody Dance, DO, have reviewed all documentation for this visit. The documentation on 07/14/22 for the exam, diagnosis, procedures, and orders are all accurate and complete.

## 2022-08-11 ENCOUNTER — Other Ambulatory Visit (HOSPITAL_COMMUNITY): Payer: Self-pay

## 2022-08-11 ENCOUNTER — Ambulatory Visit (INDEPENDENT_AMBULATORY_CARE_PROVIDER_SITE_OTHER): Payer: PRIVATE HEALTH INSURANCE | Admitting: Family Medicine

## 2022-08-11 ENCOUNTER — Encounter (INDEPENDENT_AMBULATORY_CARE_PROVIDER_SITE_OTHER): Payer: Self-pay | Admitting: Family Medicine

## 2022-08-11 VITALS — BP 113/73 | HR 84 | Temp 98.3°F | Ht 60.0 in | Wt 144.4 lb

## 2022-08-11 DIAGNOSIS — E559 Vitamin D deficiency, unspecified: Secondary | ICD-10-CM

## 2022-08-11 DIAGNOSIS — E669 Obesity, unspecified: Secondary | ICD-10-CM | POA: Diagnosis not present

## 2022-08-11 DIAGNOSIS — E7849 Other hyperlipidemia: Secondary | ICD-10-CM

## 2022-08-11 DIAGNOSIS — Z6828 Body mass index (BMI) 28.0-28.9, adult: Secondary | ICD-10-CM

## 2022-08-11 DIAGNOSIS — E88819 Insulin resistance, unspecified: Secondary | ICD-10-CM | POA: Diagnosis not present

## 2022-08-11 MED ORDER — METFORMIN HCL 500 MG PO TABS: ORAL_TABLET | ORAL | 0 refills | Status: DC

## 2022-08-11 MED ORDER — VITAMIN D (ERGOCALCIFEROL) 1.25 MG (50000 UNIT) PO CAPS: ORAL_CAPSULE | ORAL | 0 refills | Status: DC

## 2022-08-11 MED ORDER — WEGOVY 0.5 MG/0.5ML ~~LOC~~ SOAJ
0.5000 mg | SUBCUTANEOUS | 0 refills | Status: DC
  Filled 2022-08-11: qty 2, 28d supply, fill #0

## 2022-08-11 NOTE — Progress Notes (Signed)
[name], D.O.  ABFM, ABOM Specializing in Clinical Bariatric Medicine  Office located at: [address] [place], [place]  [address]     Assessment and Plan:   No orders of the defined types were placed in this encounter.   There are no discontinued medications.   No orders of the defined types were placed in this encounter.    Insulin resistance Assessment: Condition is Not at goal.. Labs were reviewed.  Lab Results  Component Value Date   HGBA1C 5.3 06/15/2022   HGBA1C 5.0 12/01/2021   HGBA1C 5.3 12/19/2019   INSULIN 8.1 06/15/2022   INSULIN 6.7 12/01/2021   INSULIN 8.6 02/25/2021     Plan:Continue Metformin 500 mg 1 po with lunch and 2 with dinner daily. Will refill this today.   - I counseled patient on pathophysiology of the disease process of I.R. - Stressed importance of dietary and lifestyle modifications to result in weight loss as first line txmnt - In addition, we discussed the risks and benefits of various medication options which can help [place] in the management of this disease process as well as with weight loss.  Will consider starting one of these meds in future as we will focus on prudent nutritional plan at this time.  - Continue to decrease simple carbs/ sugars; increase fiber and proteins -> follow her meal plan.   - Explained role of simple carbs and insulin levels on hunger and cravings - Handouts provided at pt's request after education provided.  All concerns/questions addressed.   - Anticipatory guidance given.   - Leatta will continue to work on weight loss, exercise, via their meal plan we devised to help decrease the risk of progressing to diabetes.  - We will recheck A1c and fasting insulin level in approximately 3 months from last check, or as deemed appropriate.    Other hyperlipidemia Assessment: Condition is Not at goal.. Labs were reviewed. Lab Results  Component Value Date   CHOL 258 (H) 06/15/2022   HDL 141 06/15/2022    LDLCALC 100 (H) 06/15/2022   TRIG 103 06/15/2022   CHOLHDL 2.2 12/01/2021   Last lipid panel as above.  Plan: Continue her prudent nutritional plan and continue to advance exercise and cardiovascular fitness as tolerated.  [name] agrees to continue with meds and/or our treatment plan of a heart-heathy, low cholesterol meal plan - Cardiovascular risk and specific lipid/LDL goals reviewed. - We extensively discussed several lifestyle modifications today and she will continue to work on diet, exercise and weight loss efforts.  - I stressed the importance that patient continue with our prudent nutritional plan that is low in saturated and trans fats, and low in fatty carbs to improve these numbers.  - We recommend: aerobic activity with eventual goal of a minimum of 150+ min wk plus 2 days/ week of resistance or strength training.   - We will continue routine screening as patient continues to achieve health goals along their weight loss journey     Vitamin D deficiency Assessment: Condition is Not at goal.. Labs were reviewed.  Lab Results  Component Value Date   VD25OH 28.8 (L) 06/15/2022   VD25OH 112.0 (H) 12/01/2021   VD25OH 56.6 08/02/2021   Plan: Continue Ergocalciferol 50K IU every 2 weeks. Will refill this today.  - I discussed the importance of vitamin D to the patient's health and well-being as well as to their ability to lose weight.  - I reviewed possible symptoms  of low Vitamin D:  low energy, depressed mood, muscle aches, joint aches, osteoporosis etc. with patient - It has been show that administration of vitamin D supplementation leads to improved satiety and a decrease in inflammatory markers.  Hence, low Vitamin D levels may be linked to an increased risk of cardiovascular events and even increased risk of cancers- such as colon and breast. - ideal vitamin D levels reviewed with patient   - Informed patient this may be a lifelong thing, and she was encouraged to  continue to take the medicine until told otherwise.    - weight loss will likely improve availability of vitamin D, thus encouraged Salinda to continue with meal plan and their weight loss efforts to further improve this condition.  Thus, we will need to monitor levels regularly (every 3-4 mo on average) to keep levels within normal limits and prevent over supplementation. - pt's questions and concerns regarding this condition addressed.    TREATMENT PLAN FOR OBESITY: Obesity, current BMI 28.3 (start bmi 33.40 date 01/23/20) Assessment: Condition is not at goal. Biometric data collected today, was reviewed with patient.  Fat mass has remained the same. Muscle mass has decreased by 0.4lb. Total body water has decreased by 1.6lb.   Plan:  Ayaana is currently in the action stage of change. As such, her goal is to continue weight management plan.She states that she has not yet started [facility] as it has not been available. She states that her insurance doesn't cover zeitboun. She states that she stopped using Wegovy last October.  Continue Metformin 500 mg 1 po with lunch and 2 with dinner daily. Will refill this today. Marlaina will work on healthier eating habits and try their best to follow the Continue Continue with keeping a food journal and adhering to recommended goals of 1450-1550 calories and 100 protein with Category 3 plan as a guide  best they can.   Behavioral Intervention Additional resources provided today: patient declined Evidence-based interventions for health behavior change were utilized today including the discussion of self monitoring techniques, problem-solving barriers and SMART goal setting techniques.   Regarding patient's less desirable eating habits and patterns, we employed the technique of small changes.  Pt will specifically work on: start weight lifting 3 days a week for next visit.    Recommended Physical Activity Goals Jasmon has been advised to work up to  150 minutes of moderate intensity aerobic activity a week and strengthening exercises 2-3 times per week for cardiovascular health, weight loss maintenance and preservation of muscle mass.  She has agreed to Continue current level of physical activity   Pharmacotherapy We discussed various medication options to help Hermie with her weight loss efforts and we both agreed to continue Metformin 500 mg 1 po with lunch and 2 with dinner daily.    FOLLOW UP: Return in about 4 weeks (around 09/08/2022). She was informed of the importance of frequent follow up visits to maximize her success with intensive lifestyle modifications for her multiple health conditions.   Subjective:   Chief complaint: Obesity Phoua is here to discuss her progress with her obesity treatment plan. She is on the Continue with keeping a food journal and adhering to recommended goals of 1450-1550 calories and 100 protein with Category 3 plan as a guide  and states she is following her eating plan approximately 70% of the time. She states she is exercising 50 minutes of elliptical 3 days per week.  Interval History:  YAMIRA PAPA is  here for a follow up office visit. Since last office visit she states that she's been dealing with stress at work. She states that she has not yet started [facility] as it has not been available. She states that her insurance doesn't cover zepbound. She states that she stopped using Wegovy last October.   We reviewed her meal plan and all questions were answered. Patient's food recall appears to be accurate and consistent with what is on plan when she is following it. When eating on plan, her hunger and cravings are well controlled.      Pharmacotherapy for weight loss: She is not currently taking medications  for medical weight loss.  Denies side effects.    Review of Systems:  Pertinent positives were addressed with patient today.   Weight Summary and Biometrics   No data recorded No  data recorded   No data recorded No data recorded No data recorded No data recorded   Objective:   PHYSICAL EXAM: There were no vitals taken for this visit. There is no height or weight on file to calculate BMI.  General: Well Developed, well nourished, and in no acute distress.  HEENT: Normocephalic, atraumatic Skin: Warm and dry, cap RF less 2 sec, good turgor Chest:  Normal excursion, shape, no gross abn Respiratory: speaking in full sentences, no conversational dyspnea NeuroM-Sk: Ambulates w/o assistance, moves * 4 Psych: A and O *3, insight good, mood-full  DIAGNOSTIC DATA REVIEWED:  BMET    Component Value Date/Time   NA 140 06/15/2022 0840   K 4.3 06/15/2022 0840   CL 102 06/15/2022 0840   CO2 20 06/15/2022 0840   GLUCOSE 90 06/15/2022 0840   GLUCOSE 94 12/19/2019 0946   BUN 15 06/15/2022 0840   CREATININE 0.80 06/15/2022 0840   CREATININE 0.87 12/19/2019 0946   CALCIUM 9.3 06/15/2022 0840   GFRNONAA >60 11/01/2015 1705   GFRAA >60 11/01/2015 1705   Lab Results  Component Value Date   HGBA1C 5.3 06/15/2022   HGBA1C 5.3 12/19/2019   Lab Results  Component Value Date   INSULIN 8.1 06/15/2022   INSULIN 8.5 01/23/2020   Lab Results  Component Value Date   TSH 0.959 12/01/2021   CBC    Component Value Date/Time   WBC 7.1 12/01/2021 1250   WBC 8.8 12/19/2019 0946   RBC 3.96 12/01/2021 1250   RBC 4.17 12/19/2019 0946   HGB 12.6 12/01/2021 1250   HCT 37.4 12/01/2021 1250   PLT 276 12/01/2021 1250   MCV 94 12/01/2021 1250   MCH 31.8 12/01/2021 1250   MCH 30.7 12/19/2019 0946   MCHC 33.7 12/01/2021 1250   MCHC 33.2 12/19/2019 0946   RDW 12.1 12/01/2021 1250   Iron Studies    Component Value Date/Time   FERRITIN 31.7 10/20/2014 1657   Lipid Panel     Component Value Date/Time   CHOL 258 (H) 06/15/2022 0840   TRIG 103 06/15/2022 0840   HDL 141 06/15/2022 0840   CHOLHDL 2.2 12/01/2021 1250   LDLCALC 100 (H) 06/15/2022 0840   Hepatic  Function Panel     Component Value Date/Time   PROT 6.5 06/15/2022 0840   ALBUMIN 4.3 06/15/2022 0840   AST 27 06/15/2022 0840   ALT 21 06/15/2022 0840   ALKPHOS 51 06/15/2022 0840   BILITOT <0.2 06/15/2022 0840   BILIDIR 0.0 12/19/2019 0946   IBILI 0.3 12/19/2019 0946      Component Value Date/Time   TSH 0.959 12/01/2021 1250  Nutritional Lab Results  Component Value Date   VD25OH 28.8 (L) 06/15/2022   VD25OH 112.0 (H) 12/01/2021   VD25OH 56.6 08/02/2021    Attestations:   Reviewed by clinician on day of visit: allergies, medications, problem list, medical history, surgical history, family history, social history, and previous encounter notes.   I,Safa M Kadhim,acting as a scribe for [employer], DO.,have documented all relevant documentation on the behalf of Thomasene Lot, DO,as directed by  Thomasene Lot, DO while in the presence of Thomasene Lot, DO.   I, Thomasene Lot, DO, have reviewed all documentation for this visit. The documentation on 08/11/22 for the exam, diagnosis, procedures, and orders are all accurate and complete.

## 2022-08-19 ENCOUNTER — Other Ambulatory Visit (HOSPITAL_COMMUNITY): Payer: Self-pay

## 2022-08-23 ENCOUNTER — Other Ambulatory Visit (HOSPITAL_COMMUNITY): Payer: Self-pay

## 2022-08-25 ENCOUNTER — Other Ambulatory Visit (HOSPITAL_COMMUNITY): Payer: Self-pay

## 2022-08-29 ENCOUNTER — Encounter (HOSPITAL_COMMUNITY): Payer: Self-pay

## 2022-08-31 ENCOUNTER — Other Ambulatory Visit (HOSPITAL_COMMUNITY): Payer: Self-pay

## 2022-09-01 ENCOUNTER — Telehealth (HOSPITAL_COMMUNITY): Payer: PRIVATE HEALTH INSURANCE | Admitting: Psychiatry

## 2022-09-01 ENCOUNTER — Other Ambulatory Visit: Payer: Self-pay

## 2022-09-08 ENCOUNTER — Ambulatory Visit (INDEPENDENT_AMBULATORY_CARE_PROVIDER_SITE_OTHER): Payer: PRIVATE HEALTH INSURANCE | Admitting: Family Medicine

## 2022-09-08 ENCOUNTER — Other Ambulatory Visit: Payer: Self-pay

## 2022-09-08 ENCOUNTER — Encounter (INDEPENDENT_AMBULATORY_CARE_PROVIDER_SITE_OTHER): Payer: Self-pay | Admitting: Family Medicine

## 2022-09-08 VITALS — BP 116/71 | HR 77 | Temp 98.1°F | Ht 60.0 in | Wt 147.0 lb

## 2022-09-08 DIAGNOSIS — E7849 Other hyperlipidemia: Secondary | ICD-10-CM | POA: Diagnosis not present

## 2022-09-08 DIAGNOSIS — E669 Obesity, unspecified: Secondary | ICD-10-CM

## 2022-09-08 DIAGNOSIS — R638 Other symptoms and signs concerning food and fluid intake: Secondary | ICD-10-CM

## 2022-09-08 DIAGNOSIS — Z6828 Body mass index (BMI) 28.0-28.9, adult: Secondary | ICD-10-CM

## 2022-09-08 DIAGNOSIS — E88819 Insulin resistance, unspecified: Secondary | ICD-10-CM | POA: Diagnosis not present

## 2022-09-08 DIAGNOSIS — E559 Vitamin D deficiency, unspecified: Secondary | ICD-10-CM

## 2022-09-08 HISTORY — DX: Body mass index (BMI) 28.0-28.9, adult: Z68.28

## 2022-09-08 MED ORDER — VITAMIN D (ERGOCALCIFEROL) 1.25 MG (50000 UNIT) PO CAPS: ORAL_CAPSULE | ORAL | 0 refills | Status: DC

## 2022-09-08 MED ORDER — WEGOVY 0.5 MG/0.5ML ~~LOC~~ SOAJ
0.5000 mg | SUBCUTANEOUS | 0 refills | Status: DC
  Filled 2022-09-08 – 2022-10-06 (×3): qty 2, 28d supply, fill #0

## 2022-09-08 MED ORDER — TOPIRAMATE 50 MG PO TABS: ORAL_TABLET | ORAL | 0 refills | Status: DC

## 2022-09-08 MED ORDER — METFORMIN HCL 500 MG PO TABS: ORAL_TABLET | ORAL | 0 refills | Status: DC

## 2022-09-08 NOTE — Progress Notes (Signed)
[name], D.O.  ABFM, ABOM Specializing in Clinical Bariatric Medicine  Office located at: [address] [place], [place]  [address]     Assessment and Plan:   Medications Discontinued During This Encounter  Medication Reason   metFORMIN (GLUCOPHAGE) 500 MG tablet Reorder   Vitamin D, Ergocalciferol, (DRISDOL) 1.25 MG (50000 UNIT) CAPS capsule Reorder   Semaglutide-Weight Management (WEGOVY) 0.5 MG/0.5ML SOAJ Reorder     Meds ordered this encounter  Medications   metFORMIN (GLUCOPHAGE) 500 MG tablet    Sig: 1 po with lunch and 2 po with dinner daily    Dispense:  90 tablet    Refill:  0    30 d supply;  ** OV for RF **   Do not send RF request   Semaglutide-Weight Management (WEGOVY) 0.5 MG/0.5ML SOAJ    Sig: Inject 0.5 mg into the skin once a week.    Dispense:  2 mL    Refill:  0   Vitamin D, Ergocalciferol, (DRISDOL) 1.25 MG (50000 UNIT) CAPS capsule    Sig: 1 po q 14 days    Dispense:  6 capsule    Refill:  0   topiramate (TOPAMAX) 50 MG tablet    Sig: One half tab by mouth for a week, then daily    Dispense:  30 tablet    Refill:  0     Other hyperlipidemia Assessment: Condition is not optimized.  Lab Results  Component Value Date   CHOL 258 (H) 06/15/2022   HDL 141 06/15/2022   LDLCALC 100 (H) 06/15/2022   TRIG 103 06/15/2022   CHOLHDL 2.2 12/01/2021  - She is currently not on any medication for this condition. This is diet controlled.   Plan:  STUTI GRZESIAK agrees to continue our treatment plan of a heart-heathy, low cholesterol meal plan - We will continue routine screening as patient continues to achieve health goals along their weight loss journey     Insulin resistance Assessment: Condition is not optimized.  Lab Results  Component Value Date   HGBA1C 5.3 06/15/2022   HGBA1C 5.0 12/01/2021   HGBA1C 5.3 12/19/2019   INSULIN 8.1 06/15/2022   INSULIN 6.7 12/01/2021   INSULIN 8.6 02/25/2021  - She reports good compliance and  tolerance with Metformin 500 mg TID. Denies any side effects.  - Endorses that she still has cravings at night after dinner.   Plan:  Continue with med. Will refill this today.   - Montrell will continue to work on weight loss, exercise, via their meal plan we devised to help decrease the risk of progressing to diabetes.    Vitamin D deficiency Assessment: Condition is not optimized.  Lab Results  Component Value Date   VD25OH 28.8 (L) 06/15/2022   VD25OH 112.0 (H) 12/01/2021   VD25OH 56.6 08/02/2021  - No issues with Ergocalciferol 50K IU every two weeks. Denies any side effects.   Plan: - Continue with med. Will refill this today.   - Will continue to monitor levels regularly (every 3-4 mo on average) to keep levels within normal limits and prevent over supplementation.   Abnormal craving Assessment: Condition is not optimized.  - Pt endorses that she has been having increased cravings at night after dinner.  - She has not been on any GLP-1 since October 2023.   Plan:   - Begin Topamax. Start with 1/2 tab by mouth for a week and if tolerated increase to full tab daily. Risk/benefits  of starting medication were discussed with patient. All questions were answered appropriately. -  Continue her prudent nutritional plan that is low in simple carbohydrates, saturated fats and trans fats to goal of 5-10% weight loss to achieve significant health benefits.  Pt encouraged to continually advance exercise and cardiovascular fitness as tolerated throughout weight loss journey.     TREATMENT PLAN FOR OBESITY: Obesity, current BMI 28.71 (start bmi 33.40 date 01/23/20) Assessment:  [name] is here to discuss her progress with her obesity treatment plan along with follow-up of her obesity related diagnoses. See Medical Weight Management Flowsheet for complete bioelectrical impedance results.  Condition is not optimized. Biometric data collected today, was reviewed with patient.    Since last office visit on 08/11/22 patient's Fat mass has increased by 2 lb. Muscle mass has increased by 1 lb. Total body water has increased by 0.2 lb.  Counseling done on how various foods will affect these numbers and how to maximize success  Total lbs lost to date: 24 Total weight loss percentage to date: 14.07  She has not been on any GLP-1 since October 2023.  Plan:  Ryliegh is currently in the action stage of change. As such, her goal is to continue her weight management plan. Wednesday will work on healthier eating habits and keeping a food journal and adhering to recommended goals of 1450-1550 calories and 110+ protein -  Wrote for [employer] 0.5 mg weekly, we will see if her insurance covers medication.  - I also encouraged patient to call her insurance to see if they cover any other weight loss medication.  I gave pt a list of weight loss medications to ask her insurance about.  - I again reminded pt how difficult it is to get coverage for weight loss meds.   Behavioral Intervention Additional resources provided today: patient declined Evidence-based interventions for health behavior change were utilized today including the discussion of self monitoring techniques, problem-solving barriers and SMART goal setting techniques.   Regarding patient's less desirable eating habits and patterns, we employed the technique of small changes.  Pt will specifically work on: find out from insurance what weight loss medications they cover for next visit.    Recommended Physical Activity Goals  Alajah has been advised to slowly work up to 150 minutes of moderate intensity aerobic activity a week and strengthening exercises 2-3 times per week for cardiovascular health, weight loss maintenance and preservation of muscle mass.   She has agreed to Think about ways to increase daily physical activity and overcoming barriers to exercise  FOLLOW UP: Return in about 4 weeks (around 10/06/2022).  She was informed of the importance of frequent follow up visits to maximize her success with intensive lifestyle modifications for her multiple health conditions.  Subjective:   Chief complaint: Obesity Carlyle is here to discuss her progress with her obesity treatment plan. She is on the keeping a food journal and adhering to recommended goals of 1450-1550 calories and 110+ protein and states she is following her eating plan approximately 60% of the time. She states she is elliptical 50 minutes 3 days per week.  Interval History:  IZZAH ASANTE is here for a follow up office visit.   Since last office visit: - Endorses that work has still been stressful.  - Frustrated because she has been gaining weight recently.  - Endorses that she has been working on getting in all her protein daily. - Reports having increased cravings at night after dinner.  We reviewed her meal plan and all questions were answered. Patient's food recall appears to be accurate and consistent with what is on plan when she is following it. When eating on plan, her hunger and cravings are well controlled.     Review of Systems:  Pertinent positives were addressed with patient today.  Weight Summary and Biometrics   Weight Lost Since Last Visit: 0  Weight Gained Since Last Visit: 3 lb    Vitals Temp: 98.1 F (36.7 C) BP: 116/71 Pulse Rate: 77 SpO2: 99 %   Anthropometric Measurements Height: 5' (1.524 m) Weight: 147 lb (66.7 kg) BMI (Calculated): 28.71 Weight at Last Visit: 144 lb Weight Lost Since Last Visit: 0 Weight Gained Since Last Visit: 3 lb Starting Weight: 171 lb Total Weight Loss (lbs): 24 lb (10.9 kg) Peak Weight: 171 lb   Body Composition  Body Fat %: 30.6 % Fat Mass (lbs): 45.2 lbs Muscle Mass (lbs): 97 lbs Total Body Water (lbs): 67 lbs Visceral Fat Rating : 5   Other Clinical Data Fasting: No Labs: No Today's Visit #: 38 Starting Date: 01/23/20    Objective:    PHYSICAL EXAM: Blood pressure 116/71, pulse 77, temperature 98.1 F (36.7 C), height 5' (1.524 m), weight 147 lb (66.7 kg), SpO2 99 %. Body mass index is 28.71 kg/m.  General: Well Developed, well nourished, and in no acute distress.  HEENT: Normocephalic, atraumatic Skin: Warm and dry, cap RF less 2 sec, good turgor Chest:  Normal excursion, shape, no gross abn Respiratory: speaking in full sentences, no conversational dyspnea NeuroM-Sk: Ambulates w/o assistance, moves * 4 Psych: A and O *3, insight good, mood-full  DIAGNOSTIC DATA REVIEWED:  BMET    Component Value Date/Time   NA 140 06/15/2022 0840   K 4.3 06/15/2022 0840   CL 102 06/15/2022 0840   CO2 20 06/15/2022 0840   GLUCOSE 90 06/15/2022 0840   GLUCOSE 94 12/19/2019 0946   BUN 15 06/15/2022 0840   CREATININE 0.80 06/15/2022 0840   CREATININE 0.87 12/19/2019 0946   CALCIUM 9.3 06/15/2022 0840   GFRNONAA >60 11/01/2015 1705   GFRAA >60 11/01/2015 1705   Lab Results  Component Value Date   HGBA1C 5.3 06/15/2022   HGBA1C 5.3 12/19/2019   Lab Results  Component Value Date   INSULIN 8.1 06/15/2022   INSULIN 8.5 01/23/2020   Lab Results  Component Value Date   TSH 0.959 12/01/2021   CBC    Component Value Date/Time   WBC 7.1 12/01/2021 1250   WBC 8.8 12/19/2019 0946   RBC 3.96 12/01/2021 1250   RBC 4.17 12/19/2019 0946   HGB 12.6 12/01/2021 1250   HCT 37.4 12/01/2021 1250   PLT 276 12/01/2021 1250   MCV 94 12/01/2021 1250   MCH 31.8 12/01/2021 1250   MCH 30.7 12/19/2019 0946   MCHC 33.7 12/01/2021 1250   MCHC 33.2 12/19/2019 0946   RDW 12.1 12/01/2021 1250   Iron Studies    Component Value Date/Time   FERRITIN 31.7 10/20/2014 1657   Lipid Panel     Component Value Date/Time   CHOL 258 (H) 06/15/2022 0840   TRIG 103 06/15/2022 0840   HDL 141 06/15/2022 0840   CHOLHDL 2.2 12/01/2021 1250   LDLCALC 100 (H) 06/15/2022 0840   Hepatic Function Panel     Component Value Date/Time   PROT  6.5 06/15/2022 0840   ALBUMIN 4.3 06/15/2022 0840   AST 27 06/15/2022 0840   ALT 21 06/15/2022  0840   ALKPHOS 51 06/15/2022 0840   BILITOT <0.2 06/15/2022 0840   BILIDIR 0.0 12/19/2019 0946   IBILI 0.3 12/19/2019 0946      Component Value Date/Time   TSH 0.959 12/01/2021 1250   Nutritional Lab Results  Component Value Date   VD25OH 28.8 (L) 06/15/2022   VD25OH 112.0 (H) 12/01/2021   VD25OH 56.6 08/02/2021    Attestations:   Reviewed by clinician on day of visit: allergies, medications, problem list, medical history, surgical history, family history, social history, and previous encounter notes.    I,Special Puri,acting as a Neurosurgeon for [employer], DO.,have documented all relevant documentation on the behalf of Thomasene Lot, DO,as directed by  Thomasene Lot, DO while in the presence of Thomasene Lot, DO.   I, Thomasene Lot, DO, have reviewed all documentation for this visit. The documentation on 09/08/22 for the exam, diagnosis, procedures, and orders are all accurate and complete.

## 2022-09-21 ENCOUNTER — Other Ambulatory Visit (HOSPITAL_COMMUNITY): Payer: Self-pay

## 2022-09-28 ENCOUNTER — Telehealth (INDEPENDENT_AMBULATORY_CARE_PROVIDER_SITE_OTHER): Payer: Self-pay | Admitting: Family Medicine

## 2022-09-28 NOTE — Telephone Encounter (Signed)
Prior authorization done via cover my meds for patients. Wegovy. Waiting on determination.  

## 2022-09-29 ENCOUNTER — Other Ambulatory Visit (HOSPITAL_COMMUNITY): Payer: Self-pay

## 2022-10-06 ENCOUNTER — Other Ambulatory Visit: Payer: Self-pay

## 2022-10-06 ENCOUNTER — Encounter (INDEPENDENT_AMBULATORY_CARE_PROVIDER_SITE_OTHER): Payer: Self-pay

## 2022-10-10 ENCOUNTER — Ambulatory Visit (INDEPENDENT_AMBULATORY_CARE_PROVIDER_SITE_OTHER): Payer: PRIVATE HEALTH INSURANCE | Admitting: Family Medicine

## 2022-10-10 ENCOUNTER — Encounter (INDEPENDENT_AMBULATORY_CARE_PROVIDER_SITE_OTHER): Payer: Self-pay | Admitting: Family Medicine

## 2022-10-10 VITALS — BP 115/76 | HR 73 | Temp 98.3°F | Ht 60.0 in | Wt 146.0 lb

## 2022-10-10 DIAGNOSIS — E559 Vitamin D deficiency, unspecified: Secondary | ICD-10-CM | POA: Diagnosis not present

## 2022-10-10 DIAGNOSIS — E66811 Obesity, class 1: Secondary | ICD-10-CM

## 2022-10-10 DIAGNOSIS — E669 Obesity, unspecified: Secondary | ICD-10-CM

## 2022-10-10 DIAGNOSIS — Z6828 Body mass index (BMI) 28.0-28.9, adult: Secondary | ICD-10-CM

## 2022-10-10 DIAGNOSIS — R638 Other symptoms and signs concerning food and fluid intake: Secondary | ICD-10-CM

## 2022-10-10 DIAGNOSIS — E88819 Insulin resistance, unspecified: Secondary | ICD-10-CM

## 2022-10-10 MED ORDER — TOPIRAMATE 50 MG PO TABS: ORAL_TABLET | ORAL | 0 refills | Status: DC

## 2022-10-10 MED ORDER — METFORMIN HCL 500 MG PO TABS
ORAL_TABLET | ORAL | 0 refills | Status: DC
Start: 2022-10-10 — End: 2022-11-17

## 2022-10-10 MED ORDER — VITAMIN D (ERGOCALCIFEROL) 1.25 MG (50000 UNIT) PO CAPS
ORAL_CAPSULE | ORAL | 0 refills | Status: DC
Start: 2022-10-10 — End: 2022-11-17

## 2022-10-10 NOTE — Progress Notes (Signed)
[name], D.O.  ABFM, ABOM Specializing in Clinical Bariatric Medicine  Office located at: [address] [place], [place]  [address]     Assessment and Plan:   Medications Discontinued During This Encounter  Medication Reason   metFORMIN (GLUCOPHAGE) 500 MG tablet Reorder   Vitamin D, Ergocalciferol, (DRISDOL) 1.25 MG (50000 UNIT) CAPS capsule Reorder   topiramate (TOPAMAX) 50 MG tablet Reorder     Meds ordered this encounter  Medications   Vitamin D, Ergocalciferol, (DRISDOL) 1.25 MG (50000 UNIT) CAPS capsule    Sig: 1 po q 14 days    Dispense:  6 capsule    Refill:  0   metFORMIN (GLUCOPHAGE) 500 MG tablet    Sig: 1 po with lunch and 2 po with dinner daily    Dispense:  90 tablet    Refill:  0    30 d supply;  ** OV for RF **   Do not send RF request   topiramate (TOPAMAX) 50 MG tablet    Sig: One tab by mouth twice daily    Dispense:  60 tablet    Refill:  0     Recheck Vitamin D next OV   Vitamin D deficiency Assessment: Condition is not at goal. She endorses taking Ergocalciferol 50K IU weekly and is tolerating well. Denies any adverse effects.  Lab Results  Component Value Date   VD25OH 28.8 (L) 06/15/2022   VD25OH 112.0 (H) 12/01/2021   VD25OH 56.6 08/02/2021   Plan:Continue with supplement. Will reorder this today.    Weight loss will likely improve availability of vitamin D, thus encouraged Makaylin to continue with meal plan and their weight loss efforts to further improve this condition.  Thus, we will need to monitor levels regularly (every 3-4 mo on average) to keep levels within normal limits and prevent over supplementation.   Insulin resistance Assessment: Condition is not optimized. She has been compliant with Metformin 500 mg TID. Denies any N/V/D.   Lab Results  Component Value Date   HGBA1C 5.3 06/15/2022   HGBA1C 5.0 12/01/2021   HGBA1C 5.3 12/19/2019   INSULIN 8.1 06/15/2022   INSULIN 6.7 12/01/2021   INSULIN 8.6  02/25/2021   Plan: Continue with med. Will refill this today.    Continue her prudent nutritional plan that is low in simple carbohydrates, saturated fats and trans fats to goal of 5-10% weight loss to achieve significant health benefits.  Pt encouraged to continually advance exercise and cardiovascular fitness as tolerated throughout weight loss journey. Will continue to monitor condition closely.     Abnormal craving Assessment: Condition is improving, but not optimized. She has been compliant with Topamax 50 mg daily. Denies any GI upset or other side effects. She endorses that the medication has been helpful and that her nightly cravings have decreased since prior. I anticipate that increasing her Topamax can help her even further with cravings.   Plan: We both agreed to Increase Topamax to 50 mg BID.  Reminded pt that protein with each meal is also important for controlling cravings. Will continue to monitor condition condition as it relates to her weight loss journey.    TREATMENT PLAN FOR OBESITY: BMI 28.0-28.9,adult Obesity, current BMI 28.71 (start bmi 33.40 date 01/23/20) Assessment: EYVETTE PIPER is here to discuss her progress with her obesity treatment plan along with follow-up of her obesity related diagnoses. See Medical Weight Management Flowsheet for complete bioelectrical impedance results.  Condition is improving. Biometric  data collected today, was reviewed with patient.   Since last office visit on 09/08/22 patient's  Muscle mass has increased by 1.4 lb. Fat mass has decreased by 2.4 lb. Total body water has increased by 0.4 lb.  Counseling done on how various foods will affect these numbers and how to maximize success  Total lbs lost to date: 25  Total weight loss percentage to date: 14.62   Plan: Continue keeping a food journal and adhering to recommended goals of 1450-1550 calories and 110+ protein.  - Recommended patient to try Black Bean, Edamame, or Palmini  Spaghetti. I also recommended to the [place].   Behavioral Intervention Additional resources provided today: patient declined Evidence-based interventions for health behavior change were utilized today including the discussion of self monitoring techniques, problem-solving barriers and SMART goal setting techniques.   Regarding patient's less desirable eating habits and patterns, we employed the technique of small changes.  Pt will specifically work on: restarting resistance training 2 days a week, continuing with elliptical and continuing to increase her lean protein intake for next visit.    Recommended Physical Activity Goals  Journei has been advised to slowly work up to 150 minutes of moderate intensity aerobic activity a week and strengthening exercises 2-3 times per week for cardiovascular health, weight loss maintenance and preservation of muscle mass.   She has agreed to Continue current level of physical activity   FOLLOW UP: Return in about 4 weeks (around 11/07/2022). She was informed of the importance of frequent follow up visits to maximize her success with intensive lifestyle modifications for her multiple health conditions.   Subjective:   Chief complaint: Obesity Kaia is here to discuss her progress with her obesity treatment plan. She is on the keeping a food journal and adhering to recommended goals of 1450-1550 calories and 110+ protein and states she is following her eating plan approximately 30% of the time. She states she is doing elliptical 60 minutes 3 days per week.  Interval History:  BRITTNEA STUKEL is here for a follow up office visit. Since last office visit, Anastasija has been doing well. She recently returned from a 6 day [place] cruise. On the cruise, she made healthy choices, such as focusing on her protein intake and going to the gym.  She has continued with making healthy choices since returning from the cruise. Her cravings  are better controlled on the Topamax and Metformin  Pharmacotherapy for weight loss: She is currently taking Topamax  and Metformin for medical weight loss.  Denies side effects.    Review of Systems:  Pertinent positives were addressed with patient today.  Reviewed by clinician on day of visit: allergies, medications, problem list, medical history, surgical history, family history, social history, and previous encounter notes.  Weight Summary and Biometrics   Weight Lost Since Last Visit: 1lb  Weight Gained Since Last Visit: 0lb    Vitals Temp: 98.3 F (36.8 C) BP: 115/76 Pulse Rate: 73 SpO2: 100 %   Anthropometric Measurements Height: 5' (1.524 m) Weight: 146 lb (66.2 kg) BMI (Calculated): 28.51 Weight at Last Visit: 147lb Weight Lost Since Last Visit: 1lb Weight Gained Since Last Visit: 0lb Starting Weight: 171lb Total Weight Loss (lbs): 25 lb (11.3 kg) Peak Weight: 171lb   Body Composition  Body Fat %: 29.2 % Fat Mass (lbs): 42.8 lbs Muscle Mass (lbs): 98.4 lbs Total Body Water (lbs): 67.4 lbs Visceral Fat Rating : 5   Other Clinical Data Fasting: no  Labs: no Today's Visit #: 39 Starting Date: 01/23/20   Objective:   PHYSICAL EXAM: Blood pressure 115/76, pulse 73, temperature 98.3 F (36.8 C), height 5' (1.524 m), weight 146 lb (66.2 kg), SpO2 100 %. Body mass index is 28.51 kg/m.  General: Well Developed, well nourished, and in no acute distress.  HEENT: Normocephalic, atraumatic Skin: Warm and dry, cap RF less 2 sec, good turgor Chest:  Normal excursion, shape, no gross abn Respiratory: speaking in full sentences, no conversational dyspnea NeuroM-Sk: Ambulates w/o assistance, moves * 4 Psych: A and O *3, insight good, mood-full  DIAGNOSTIC DATA REVIEWED:  BMET    Component Value Date/Time   NA 140 06/15/2022 0840   K 4.3 06/15/2022 0840   CL 102 06/15/2022 0840   CO2 20 06/15/2022 0840   GLUCOSE 90 06/15/2022 0840   GLUCOSE 94  12/19/2019 0946   BUN 15 06/15/2022 0840   CREATININE 0.80 06/15/2022 0840   CREATININE 0.87 12/19/2019 0946   CALCIUM 9.3 06/15/2022 0840   GFRNONAA >60 11/01/2015 1705   GFRAA >60 11/01/2015 1705   Lab Results  Component Value Date   HGBA1C 5.3 06/15/2022   HGBA1C 5.3 12/19/2019   Lab Results  Component Value Date   INSULIN 8.1 06/15/2022   INSULIN 8.5 01/23/2020   Lab Results  Component Value Date   TSH 0.959 12/01/2021   CBC    Component Value Date/Time   WBC 7.1 12/01/2021 1250   WBC 8.8 12/19/2019 0946   RBC 3.96 12/01/2021 1250   RBC 4.17 12/19/2019 0946   HGB 12.6 12/01/2021 1250   HCT 37.4 12/01/2021 1250   PLT 276 12/01/2021 1250   MCV 94 12/01/2021 1250   MCH 31.8 12/01/2021 1250   MCH 30.7 12/19/2019 0946   MCHC 33.7 12/01/2021 1250   MCHC 33.2 12/19/2019 0946   RDW 12.1 12/01/2021 1250   Iron Studies    Component Value Date/Time   FERRITIN 31.7 10/20/2014 1657   Lipid Panel     Component Value Date/Time   CHOL 258 (H) 06/15/2022 0840   TRIG 103 06/15/2022 0840   HDL 141 06/15/2022 0840   CHOLHDL 2.2 12/01/2021 1250   LDLCALC 100 (H) 06/15/2022 0840   Hepatic Function Panel     Component Value Date/Time   PROT 6.5 06/15/2022 0840   ALBUMIN 4.3 06/15/2022 0840   AST 27 06/15/2022 0840   ALT 21 06/15/2022 0840   ALKPHOS 51 06/15/2022 0840   BILITOT <0.2 06/15/2022 0840   BILIDIR 0.0 12/19/2019 0946   IBILI 0.3 12/19/2019 0946      Component Value Date/Time   TSH 0.959 12/01/2021 1250   Nutritional Lab Results  Component Value Date   VD25OH 28.8 (L) 06/15/2022   VD25OH 112.0 (H) 12/01/2021   VD25OH 56.6 08/02/2021    Attestations:   I, Special Puri, acting as a Stage manager for Thomasene Lot, DO., have compiled all relevant documentation for today's office visit on behalf of Thomasene Lot, DO, while in the presence of [employer], DO.  I have reviewed the above documentation for accuracy and completeness, and I agree  with the above. [name], D.O.  The 21st Century Cures Act was signed into law in 2016 which includes the topic of electronic health records.  This provides immediate access to information in MyChart.  This includes consultation notes, operative notes, office notes, lab results and pathology reports.  If you have any questions about what you read please let us  know at your next visit so we can discuss your concerns and take corrective action if need be.  We are right here with you.

## 2022-11-17 ENCOUNTER — Encounter (INDEPENDENT_AMBULATORY_CARE_PROVIDER_SITE_OTHER): Payer: Self-pay | Admitting: Family Medicine

## 2022-11-17 ENCOUNTER — Ambulatory Visit (INDEPENDENT_AMBULATORY_CARE_PROVIDER_SITE_OTHER): Payer: PRIVATE HEALTH INSURANCE | Admitting: Family Medicine

## 2022-11-17 VITALS — BP 109/77 | HR 82 | Temp 98.3°F | Ht 60.0 in | Wt 144.0 lb

## 2022-11-17 DIAGNOSIS — E559 Vitamin D deficiency, unspecified: Secondary | ICD-10-CM | POA: Diagnosis not present

## 2022-11-17 DIAGNOSIS — R638 Other symptoms and signs concerning food and fluid intake: Secondary | ICD-10-CM | POA: Diagnosis not present

## 2022-11-17 DIAGNOSIS — Z6828 Body mass index (BMI) 28.0-28.9, adult: Secondary | ICD-10-CM

## 2022-11-17 DIAGNOSIS — E88819 Insulin resistance, unspecified: Secondary | ICD-10-CM

## 2022-11-17 DIAGNOSIS — E669 Obesity, unspecified: Secondary | ICD-10-CM

## 2022-11-17 DIAGNOSIS — E66811 Obesity, class 1: Secondary | ICD-10-CM

## 2022-11-17 MED ORDER — VITAMIN D (ERGOCALCIFEROL) 1.25 MG (50000 UNIT) PO CAPS
ORAL_CAPSULE | ORAL | 0 refills | Status: DC
Start: 2022-11-17 — End: 2022-12-05

## 2022-11-17 MED ORDER — TOPIRAMATE 50 MG PO TABS
ORAL_TABLET | ORAL | 0 refills | Status: DC
Start: 2022-11-17 — End: 2022-12-05

## 2022-11-17 MED ORDER — METFORMIN HCL 500 MG PO TABS
ORAL_TABLET | ORAL | 0 refills | Status: DC
Start: 2022-11-17 — End: 2022-12-05

## 2022-11-17 NOTE — Progress Notes (Signed)
[name], D.O.  ABFM, ABOM Specializing in Clinical Bariatric Medicine  Office located at: [address] [place], [place]  [address]     Assessment and Plan:   Medications Discontinued During This Encounter  Medication Reason   methylphenidate (CONCERTA) 36 MG PO CR tablet Duplicate   methylphenidate (CONCERTA) 36 MG PO CR tablet Duplicate   norethindrone-ethinyl estradiol (BLISOVI FE 1/20) 1-20 MG-MCG tablet Expired Prescription   Vitamin D, Ergocalciferol, (DRISDOL) 1.25 MG (50000 UNIT) CAPS capsule Reorder   metFORMIN (GLUCOPHAGE) 500 MG tablet Reorder   topiramate (TOPAMAX) 50 MG tablet Reorder     Meds ordered this encounter  Medications   metFORMIN (GLUCOPHAGE) 500 MG tablet    Sig: 1 po with lunch and 2 po with dinner daily    Dispense:  90 tablet    Refill:  0    30 d supply;  ** OV for RF **   Do not send RF request   Vitamin D, Ergocalciferol, (DRISDOL) 1.25 MG (50000 UNIT) CAPS capsule    Sig: 1 po q 14 days    Dispense:  6 capsule    Refill:  0   topiramate (TOPAMAX) 50 MG tablet    Sig: One tab by mouth twice daily    Dispense:  60 tablet    Refill:  0     Insulin resistance Assessment & Plan:  Condition being treated with Metformin 500 mg tid. Pt tolerating supplement well- no complaints of GI upset. Hunger and cravings have not been problematic.   Lab Results  Component Value Date   HGBA1C 5.3 06/15/2022   HGBA1C 5.0 12/01/2021   HGBA1C 5.3 12/19/2019   INSULIN 8.1 06/15/2022   INSULIN 6.7 12/01/2021   INSULIN 8.6 02/25/2021    Will refill Metformin today; stay at current dose. Continue to decrease simple carbs/ sugars; increase fiber and proteins -> follow her meal plan.   We will recheck A1c and fasting insulin level as deemed appropriate.    Vitamin D deficiency Assessment & Plan:  She is compliant and tolerant with ERGO 50K lU every 14 days. Denies any adverse effects.   Lab Results  Component Value Date   VD25OH 70.6  11/17/2022   VD25OH 28.8 (L) 06/15/2022   VD25OH 112.0 (H) 12/01/2021   Recheck labs today. Continue with meal plan, their weight loss efforts, and ERGO at current dose to further improve this condition. Will refill ERGO today.     Abnormal craving Assessment & Plan: Endorses that her hunger and cravings are well controlled. She reports good compliance and tolerance with Topamax 50 mg bid. Reports that carbonated drinks like soda and beer "taste flat".   Will refill Topamax today; maintain at current dose. Continue her prudent nutritional plan that is low in simple carbohydrates, saturated fats and trans fats to goal of 5-10% weight loss to achieve significant health benefits.  Will continue to monitor condition as it relates to their weight loss journey    TREATMENT PLAN FOR OBESITY: BMI 28.0-28.9,adult Obesity, current BMI 28.12 (start bmi 33.40 date 01/23/20) Assessment & Plan:  LAELA DEVINEY is here to discuss her progress with her obesity treatment plan along with follow-up of her obesity related diagnoses. See Medical Weight Management Flowsheet for complete bioelectrical impedance results.  Condition is improving, but not optimized. Biometric data collected today, was reviewed with patient.   Since last office visit on 10/10/22 patient's  Muscle mass has decreased by 0.6 lb. Fat mass has  decreased by 1.4 lb. Total body water has decreased by 0.8 lb.  Counseling done on how various foods will affect these numbers and how to maximize success  Total lbs lost to date: 27 lbs  Total weight loss percentage to date: 15.79%  Continue keeping a food journal and adhering to recommended goals of 1450-1550 calories and 110+ protein.  Behavioral Intervention Additional resources provided today: patient declined Evidence-based interventions for health behavior change were utilized today including the discussion of self monitoring techniques, problem-solving barriers and SMART goal setting  techniques.   Regarding patient's less desirable eating habits and patterns, we employed the technique of small changes.  Pt will specifically work on: continuing with meal plan and current excercise regiment for next visit.    Recommended Physical Activity Goals  Emeline has been advised to slowly work up to 150 minutes of moderate intensity aerobic activity a week and strengthening exercises 2-3 times per week for cardiovascular health, weight loss maintenance and preservation of muscle mass. She has agreed to Continue current level of physical activity   FOLLOW UP: Return in about 4 weeks (around 12/15/2022). She was informed of the importance of frequent follow up visits to maximize her success with intensive lifestyle modifications for her multiple health conditions.  CHAMEKA MCMULLEN is aware that we will review all of her lab results at our next visit.  She is aware that if anything is critical/ life threatening with the results, we will be contacting her via MyChart prior to the office visit to discuss management.    Subjective:   Chief complaint: Obesity Ezri is here to discuss her progress with her obesity treatment plan. She is keeping a food journal and adhering to recommended goals of 1450-1550 calories and 110+ protein and states she is following her eating plan approximately 75% of the time. She states she is doing elliptical 40 minutes, 3 days per week.  Interval History:  TASHERA MONTALVO is here for a follow up office visit. Since last OV, Reyanne has been doing well. Endorses that her insurance no longer covers Wegovy 0.5 mg. Started compounded Semaglutide 0.25 mg once wkly roughly 3 weeks ago. Appears to be tolerating well. Obtains it from [facility]. Hunger and cravings well controlled. 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   Pharmacotherapy for weight loss: She is currently taking  Wellbutrin XL 300 mg daily, Metformin 500 mg TID, and 0.25 mg compounded  Semaglutide once weekly, and Topamax 50 mg bid  for medical weight loss.  Denies side effects.    Review of Systems:  Pertinent positives were addressed with patient today.  Reviewed by clinician on day of visit: allergies, medications, problem list, medical history, surgical history, family history, social history, and previous encounter notes.  Weight Summary and Biometrics   Weight Lost Since Last Visit: 2lb  No data recorded   Vitals Temp: 98.3 F (36.8 C) BP: 109/77 Pulse Rate: 82 SpO2: 100 %   Anthropometric Measurements Height: 5' (1.524 m) Weight: 144 lb (65.3 kg) BMI (Calculated): 28.12 Weight at Last Visit: 146lb Weight Lost Since Last Visit: 2lb Starting Weight: 171lb Total Weight Loss (lbs): 27 lb (12.2 kg) Peak Weight: 171lb   Body Composition  Body Fat %: 28.7 % Fat Mass (lbs): 41.4 lbs Muscle Mass (lbs): 97.6 lbs Total Body Water (lbs): 66.6 lbs Visceral Fat Rating : 4   Other Clinical Data Fasting: no Labs: no Today's Visit #: 40 Starting Date: 01/23/20   Objective:   PHYSICAL  EXAM: Blood pressure 109/77, pulse 82, temperature 98.3 F (36.8 C), height 5' (1.524 m), weight 144 lb (65.3 kg), SpO2 100%. Body mass index is 28.12 kg/m.  General: Well Developed, well nourished, and in no acute distress.  HEENT: Normocephalic, atraumatic Skin: Warm and dry, cap RF less 2 sec, good turgor Chest:  Normal excursion, shape, no gross abn Respiratory: speaking in full sentences, no conversational dyspnea NeuroM-Sk: Ambulates w/o assistance, moves * 4 Psych: A and O *3, insight good, mood-full  DIAGNOSTIC DATA REVIEWED:  BMET    Component Value Date/Time   NA 140 06/15/2022 0840   K 4.3 06/15/2022 0840   CL 102 06/15/2022 0840   CO2 20 06/15/2022 0840   GLUCOSE 90 06/15/2022 0840   GLUCOSE 94 12/19/2019 0946   BUN 15 06/15/2022 0840   CREATININE 0.80 06/15/2022 0840   CREATININE 0.87 12/19/2019 0946   CALCIUM 9.3 06/15/2022 0840    GFRNONAA >60 11/01/2015 1705   GFRAA >60 11/01/2015 1705   Lab Results  Component Value Date   HGBA1C 5.3 06/15/2022   HGBA1C 5.3 12/19/2019   Lab Results  Component Value Date   INSULIN 8.1 06/15/2022   INSULIN 8.5 01/23/2020   Lab Results  Component Value Date   TSH 0.959 12/01/2021   CBC    Component Value Date/Time   WBC 7.1 12/01/2021 1250   WBC 8.8 12/19/2019 0946   RBC 3.96 12/01/2021 1250   RBC 4.17 12/19/2019 0946   HGB 12.6 12/01/2021 1250   HCT 37.4 12/01/2021 1250   PLT 276 12/01/2021 1250   MCV 94 12/01/2021 1250   MCH 31.8 12/01/2021 1250   MCH 30.7 12/19/2019 0946   MCHC 33.7 12/01/2021 1250   MCHC 33.2 12/19/2019 0946   RDW 12.1 12/01/2021 1250   Iron Studies    Component Value Date/Time   FERRITIN 31.7 10/20/2014 1657   Lipid Panel     Component Value Date/Time   CHOL 258 (H) 06/15/2022 0840   TRIG 103 06/15/2022 0840   HDL 141 06/15/2022 0840   CHOLHDL 2.2 12/01/2021 1250   LDLCALC 100 (H) 06/15/2022 0840   Hepatic Function Panel     Component Value Date/Time   PROT 6.5 06/15/2022 0840   ALBUMIN 4.3 06/15/2022 0840   AST 27 06/15/2022 0840   ALT 21 06/15/2022 0840   ALKPHOS 51 06/15/2022 0840   BILITOT <0.2 06/15/2022 0840   BILIDIR 0.0 12/19/2019 0946   IBILI 0.3 12/19/2019 0946      Component Value Date/Time   TSH 0.959 12/01/2021 1250   Nutritional Lab Results  Component Value Date   VD25OH 70.6 11/17/2022   VD25OH 28.8 (L) 06/15/2022   VD25OH 112.0 (H) 12/01/2021    Attestations:   I, Special Puri , acting as a Stage manager for Thomasene Lot, DO., have compiled all relevant documentation for today's office visit on behalf of Thomasene Lot, DO, while in the presence of [employer], DO.  I have reviewed the above documentation for accuracy and completeness, and I agree with the above. [name], D.O.  The 21st Century Cures Act was signed into law in 2016 which includes the topic of electronic health  records.  This provides immediate access to information in MyChart.  This includes consultation notes, operative notes, office notes, lab results and pathology reports.  If you have any questions about what you read please let us know at your next visit so we can discuss your concerns and take corrective action  if need be.  We are right here with you.

## 2022-12-05 ENCOUNTER — Ambulatory Visit (INDEPENDENT_AMBULATORY_CARE_PROVIDER_SITE_OTHER): Payer: Self-pay | Admitting: Family Medicine

## 2022-12-05 ENCOUNTER — Encounter (INDEPENDENT_AMBULATORY_CARE_PROVIDER_SITE_OTHER): Payer: Self-pay | Admitting: Family Medicine

## 2022-12-05 DIAGNOSIS — E559 Vitamin D deficiency, unspecified: Secondary | ICD-10-CM | POA: Diagnosis not present

## 2022-12-05 DIAGNOSIS — E669 Obesity, unspecified: Secondary | ICD-10-CM | POA: Diagnosis not present

## 2022-12-05 DIAGNOSIS — E88819 Insulin resistance, unspecified: Secondary | ICD-10-CM

## 2022-12-05 DIAGNOSIS — Z6827 Body mass index (BMI) 27.0-27.9, adult: Secondary | ICD-10-CM

## 2022-12-05 DIAGNOSIS — E66811 Obesity, class 1: Secondary | ICD-10-CM

## 2022-12-05 DIAGNOSIS — R638 Other symptoms and signs concerning food and fluid intake: Secondary | ICD-10-CM

## 2022-12-05 MED ORDER — VITAMIN D (ERGOCALCIFEROL) 1.25 MG (50000 UNIT) PO CAPS
50000.0000 [IU] | ORAL_CAPSULE | ORAL | 0 refills | Status: DC
Start: 2022-12-05 — End: 2023-08-21
  Filled 2023-03-10: qty 6, 84d supply, fill #0

## 2022-12-05 MED ORDER — TOPIRAMATE 50 MG PO TABS
50.0000 mg | ORAL_TABLET | Freq: Two times a day (BID) | ORAL | 0 refills | Status: DC
Start: 2022-12-05 — End: 2023-01-11

## 2022-12-05 MED ORDER — METFORMIN HCL 500 MG PO TABS
500.0000 mg | ORAL_TABLET | ORAL | 0 refills | Status: DC
Start: 2022-12-05 — End: 2024-01-03

## 2022-12-05 NOTE — Progress Notes (Signed)
[name], D.O.  ABFM, ABOM Specializing in Clinical Bariatric Medicine  Office located at: [address] [place], [place]  [address]     Assessment and Plan:   No orders of the defined types were placed in this encounter.   Medications Discontinued During This Encounter  Medication Reason   metFORMIN (GLUCOPHAGE) 500 MG tablet Reorder   Vitamin D, Ergocalciferol, (DRISDOL) 1.25 MG (50000 UNIT) CAPS capsule Reorder   topiramate (TOPAMAX) 50 MG tablet Reorder     Meds ordered this encounter  Medications   Vitamin D, Ergocalciferol, (DRISDOL) 1.25 MG (50000 UNIT) CAPS capsule    Sig: 1 po q 14 days    Dispense:  6 capsule    Refill:  0   topiramate (TOPAMAX) 50 MG tablet    Sig: One tab by mouth twice daily    Dispense:  60 tablet    Refill:  0   metFORMIN (GLUCOPHAGE) 500 MG tablet    Sig: 1 po with lunch and 2 po with dinner daily    Dispense:  90 tablet    Refill:  0    30 d supply;  ** OV for RF **   Do not send RF request    Vitamin D deficiency Assessment: Condition is Controlled.. This is well controlled with Ergocalciferol every 2 weeks. She tolerates this well and denies any adverse side effects.  Lab Results  Component Value Date   VD25OH 70.6 11/17/2022   VD25OH 28.8 (L) 06/15/2022   VD25OH 112.0 (H) 12/01/2021   Plan: - Continue with Ergocalciferol 50K IU once every 2 weeks. I will refill this today.   - ideal vitamin D levels reviewed with patient    - Informed patient this may be a lifelong thing, and she was encouraged to continue to take the medicine until told otherwise.     - weight loss will likely improve availability of vitamin D, thus encouraged [name] to continue with meal plan and their weight loss efforts to further improve this condition.  Thus, we will need to monitor levels regularly (every 3-4 mo on average) to keep levels within normal limits and prevent over supplementation.  - Labs were reviewed with patient today  and education provided on them. We discussed how the foods patient eats may influence these laboratory findings.  All of the patient's questions about them were answered    Abnormal craving Assessment: Condition is Improving. Pt continues to take [facility] as directed. She endorses noticing a improvement in cravings.   Plan: - Continue Wegovy 0.5mg  once weekly. She denies a need for dosage increase.    Insulin resistance  Goal is HgbA1c < 5.7, fasting insulin closer to 5.  Pt has been compliant and tolerant with Topamax, Wellbutrin, and Metformin. She denies GI upset, N/V/D, or any adverse side effects. She notices an improvement/control in her hunger and cravings.  Lab Results  Component Value Date   HGBA1C 5.3 06/15/2022   Lab Results  Component Value Date   INSULIN 8.1 06/15/2022   INSULIN 6.7 12/01/2021   INSULIN 8.6 02/25/2021   INSULIN 8.1 06/25/2020   INSULIN 8.5 01/23/2020   Plan:- Continue Metformin, Topamax, and Wellbutrin at current dose. I will refill today.   - She will continue to focus on protein-rich, low simple carbohydrate foods. We reviewed the importance of hydration, regular exercise for stress reduction, and restorative sleep.   - Anticipatory guidance given.    - [name] will continue to work on  weight loss, exercise, via their meal plan we devised.  - Labs were reviewed with patient today and education provided on them. We discussed how the foods patient eats may influence these laboratory findings.  All of the patient's questions about them were answered    TREATMENT PLAN FOR OBESITY: Obesity, current BMI 27.93 (start bmi 33.40 date 01/23/20) Assessment:  [name] is here to discuss her progress with her obesity treatment plan along with follow-up of her obesity related diagnoses. See Medical Weight Management Flowsheet for complete bioelectrical impedance results.  Condition is not optimized. Biometric data collected today, was reviewed with  patient.   Since last office visit on 11/17/2022 patient's  Muscle mass has decreased by 3lb. Fat mass has increased by 2.4lb. Total body water has decreased by 1.2lb.  Counseling done on how various foods will affect these numbers and how to maximize success  Total lbs lost to date: 28lbs Total weight loss percentage to date: 16.37%    Plan: - Continue to log into her food journal and adhering to recommended goals of 1450-1550 calories and 110+ protein   - I recommended that she do cardio at home when she doesn't have time to go to the gym.   Behavioral Intervention Additional resources provided today: patient declined Evidence-based interventions for health behavior change were utilized today including the discussion of self monitoring techniques, problem-solving barriers and SMART goal setting techniques.   Regarding patient's less desirable eating habits and patterns, we employed the technique of small changes.  Pt will specifically work on: Try to get back to weight lifting for next visit.    Recommended Physical Activity Goals  [name] has been advised to slowly work up to 150 minutes of moderate intensity aerobic activity a week and strengthening exercises 2-3 times per week for cardiovascular health, weight loss maintenance and preservation of muscle mass.   She has agreed to Continue current level of physical activity  and Think about ways to increase daily physical activity and overcoming barriers to exercise  FOLLOW UP: Return in about 5 weeks (around 01/09/2023).  She was informed of the importance of frequent follow up visits to maximize her success with intensive lifestyle modifications for her multiple health conditions.  Subjective:   Chief complaint: Obesity [name] is here to discuss her progress with her obesity treatment plan. She is on the keeping a food journal and adhering to recommended goals of 1450-1550 calories and 110+ protein and states she is following  her eating plan approximately 65% of the time. She states she is using the eliptacle  40 minutes 3 days per week.  Interval History:  [name] is here for a follow up office visit.     Since last OV she has been well. Pt informed me that she recently bought her husband a [employer] grill and has increased in eating off plan as he enjoys using it. However she has gotten back on track with eating on plan. She has been trying to increase her physical activity. Pt informed me that she will be starting a new job soon and thinks that it will be harder for her to go to the gym.    Pharmacotherapy for weight loss: She is currently taking Wegovy, Topamax, Wellbutrin, and Metformin  for medical weight loss.  Denies side effects.    Review of Systems:  Pertinent positives were addressed with patient today.   Reviewed by clinician on day of visit: allergies, medications, problem list, medical history, surgical  history, family history, social history, and previous encounter notes.  Weight Summary and Biometrics   Weight Lost Since Last Visit: 1lb  Weight Gained Since Last Visit: 0lb  Vitals Temp: 98.5 F (36.9 C) BP: 115/77 Pulse Rate: 79 SpO2: 100 %   Anthropometric Measurements Height: 5' (1.524 m) Weight: 143 lb (64.9 kg) BMI (Calculated): 27.93 Weight at Last Visit: 144lb Weight Lost Since Last Visit: 1lb Weight Gained Since Last Visit: 0lb Starting Weight: 171lb Total Weight Loss (lbs): 28 lb (12.7 kg) Peak Weight: 171lb   Body Composition  Body Fat %: 30.5 % Fat Mass (lbs): 43.8 lbs Muscle Mass (lbs): 94.6 lbs Total Body Water (lbs): 65.4 lbs Visceral Fat Rating : 5   Other Clinical Data Fasting: no Labs: no Today's Visit #: 41 Starting Date: 01/23/20     Objective:   PHYSICAL EXAM: Blood pressure 115/77, pulse 79, temperature 98.5 F (36.9 C), height 5' (1.524 m), weight 143 lb (64.9 kg), SpO2 100%. Body mass index is 27.93 kg/m.  General: Well  Developed, well nourished, and in no acute distress.  HEENT: Normocephalic, atraumatic Skin: Warm and dry, cap RF less 2 sec, good turgor Chest:  Normal excursion, shape, no gross abn Respiratory: speaking in full sentences, no conversational dyspnea NeuroM-Sk: Ambulates w/o assistance, moves * 4 Psych: A and O *3, insight good, mood-full  DIAGNOSTIC DATA REVIEWED:  BMET    Component Value Date/Time   NA 140 06/15/2022 0840   K 4.3 06/15/2022 0840   CL 102 06/15/2022 0840   CO2 20 06/15/2022 0840   GLUCOSE 90 06/15/2022 0840   GLUCOSE 94 12/19/2019 0946   BUN 15 06/15/2022 0840   CREATININE 0.80 06/15/2022 0840   CREATININE 0.87 12/19/2019 0946   CALCIUM 9.3 06/15/2022 0840   GFRNONAA >60 11/01/2015 1705   GFRAA >60 11/01/2015 1705   Lab Results  Component Value Date   HGBA1C 5.3 06/15/2022   HGBA1C 5.3 12/19/2019   Lab Results  Component Value Date   INSULIN 8.1 06/15/2022   INSULIN 8.5 01/23/2020   Lab Results  Component Value Date   TSH 0.959 12/01/2021   CBC    Component Value Date/Time   WBC 7.1 12/01/2021 1250   WBC 8.8 12/19/2019 0946   RBC 3.96 12/01/2021 1250   RBC 4.17 12/19/2019 0946   HGB 12.6 12/01/2021 1250   HCT 37.4 12/01/2021 1250   PLT 276 12/01/2021 1250   MCV 94 12/01/2021 1250   MCH 31.8 12/01/2021 1250   MCH 30.7 12/19/2019 0946   MCHC 33.7 12/01/2021 1250   MCHC 33.2 12/19/2019 0946   RDW 12.1 12/01/2021 1250   Iron Studies    Component Value Date/Time   FERRITIN 31.7 10/20/2014 1657   Lipid Panel     Component Value Date/Time   CHOL 258 (H) 06/15/2022 0840   TRIG 103 06/15/2022 0840   HDL 141 06/15/2022 0840   CHOLHDL 2.2 12/01/2021 1250   LDLCALC 100 (H) 06/15/2022 0840   Hepatic Function Panel     Component Value Date/Time   PROT 6.5 06/15/2022 0840   ALBUMIN 4.3 06/15/2022 0840   AST 27 06/15/2022 0840   ALT 21 06/15/2022 0840   ALKPHOS 51 06/15/2022 0840   BILITOT <0.2 06/15/2022 0840   BILIDIR 0.0 12/19/2019  0946   IBILI 0.3 12/19/2019 0946      Component Value Date/Time   TSH 0.959 12/01/2021 1250   Nutritional Lab Results  Component Value Date   VD25OH 70.6 11/17/2022  VD25OH 28.8 (L) 06/15/2022   VD25OH 112.0 (H) 12/01/2021    Attestations:   I, Clinical biochemist, acting as a Stage manager for [employer], DO., have compiled all relevant documentation for today's office visit on behalf of Thomasene Lot, DO, while in the presence of [employer], DO.  I have reviewed the above documentation for accuracy and completeness, and I agree with the above. [name], D.O.  The 21st Century Cures Act was signed into law in 2016 which includes the topic of electronic health records.  This provides immediate access to information in MyChart.  This includes consultation notes, operative notes, office notes, lab results and pathology reports.  If you have any questions about what you read please let us know at your next visit so we can discuss your concerns and take corrective action if need be.  We are right here with you.

## 2022-12-23 ENCOUNTER — Other Ambulatory Visit: Payer: Self-pay

## 2022-12-24 ENCOUNTER — Other Ambulatory Visit (HOSPITAL_BASED_OUTPATIENT_CLINIC_OR_DEPARTMENT_OTHER): Payer: Self-pay

## 2022-12-24 MED ORDER — SEMAGLUTIDE-WEIGHT MANAGEMENT 0.5 MG/0.5ML ~~LOC~~ SOAJ: 0.5000 mg | SUBCUTANEOUS | 0 refills | Status: DC

## 2022-12-24 MED ORDER — DROSPIRENONE-ETHINYL ESTRADIOL 3-0.03 MG PO TABS
1.0000 | ORAL_TABLET | Freq: Every day | ORAL | 3 refills | Status: DC
  Filled 2023-01-17: qty 112, 84d supply, fill #0

## 2022-12-24 MED ORDER — ZEPBOUND 2.5 MG/0.5ML ~~LOC~~ SOAJ: 2.5000 mg | SUBCUTANEOUS | 0 refills | Status: DC

## 2022-12-27 ENCOUNTER — Other Ambulatory Visit (HOSPITAL_BASED_OUTPATIENT_CLINIC_OR_DEPARTMENT_OTHER): Payer: Self-pay

## 2022-12-27 ENCOUNTER — Other Ambulatory Visit (HOSPITAL_COMMUNITY): Payer: Self-pay

## 2022-12-28 ENCOUNTER — Other Ambulatory Visit (HOSPITAL_COMMUNITY): Payer: Self-pay

## 2022-12-28 MED ORDER — REXULTI 1 MG PO TABS
1.0000 mg | ORAL_TABLET | Freq: Every day | ORAL | 0 refills | Status: DC
  Filled 2022-12-28: qty 30, 30d supply, fill #0

## 2022-12-29 ENCOUNTER — Telehealth: Payer: Self-pay | Admitting: Pharmacist

## 2022-12-29 ENCOUNTER — Other Ambulatory Visit (HOSPITAL_COMMUNITY): Payer: Self-pay

## 2022-12-29 MED ORDER — RINVOQ 15 MG PO TB24
ORAL_TABLET | ORAL | 2 refills | Status: DC
  Filled 2023-01-04 (×2): qty 30, 30d supply, fill #0

## 2022-12-29 NOTE — Telephone Encounter (Signed)
Called patient to schedule an appointment for the [employer] [facility]. I was unable to reach the patient so I left a HIPAA-compliant message requesting that the patient return my call.   Luke Van Ausdall, PharmD, BCACP, CPP Clinical Pharmacist [facility] [phone]  

## 2022-12-30 ENCOUNTER — Other Ambulatory Visit (HOSPITAL_COMMUNITY): Payer: Self-pay

## 2022-12-31 ENCOUNTER — Other Ambulatory Visit (HOSPITAL_COMMUNITY): Payer: Self-pay

## 2023-01-02 ENCOUNTER — Other Ambulatory Visit: Payer: Self-pay

## 2023-01-02 ENCOUNTER — Other Ambulatory Visit (HOSPITAL_COMMUNITY): Payer: Self-pay

## 2023-01-03 ENCOUNTER — Other Ambulatory Visit: Payer: Self-pay

## 2023-01-04 ENCOUNTER — Other Ambulatory Visit (HOSPITAL_COMMUNITY): Payer: Self-pay

## 2023-01-04 ENCOUNTER — Other Ambulatory Visit: Payer: Self-pay

## 2023-01-04 DIAGNOSIS — F332 Major depressive disorder, recurrent severe without psychotic features: Secondary | ICD-10-CM | POA: Diagnosis not present

## 2023-01-04 DIAGNOSIS — F411 Generalized anxiety disorder: Secondary | ICD-10-CM | POA: Diagnosis not present

## 2023-01-04 DIAGNOSIS — F4310 Post-traumatic stress disorder, unspecified: Secondary | ICD-10-CM | POA: Diagnosis not present

## 2023-01-04 DIAGNOSIS — F900 Attention-deficit hyperactivity disorder, predominantly inattentive type: Secondary | ICD-10-CM | POA: Diagnosis not present

## 2023-01-04 DIAGNOSIS — F4312 Post-traumatic stress disorder, chronic: Secondary | ICD-10-CM | POA: Diagnosis not present

## 2023-01-06 ENCOUNTER — Other Ambulatory Visit: Payer: Self-pay

## 2023-01-09 ENCOUNTER — Ambulatory Visit (INDEPENDENT_AMBULATORY_CARE_PROVIDER_SITE_OTHER): Payer: PRIVATE HEALTH INSURANCE | Admitting: Family Medicine

## 2023-01-10 ENCOUNTER — Other Ambulatory Visit (HOSPITAL_COMMUNITY): Payer: Self-pay

## 2023-01-11 ENCOUNTER — Other Ambulatory Visit: Payer: Self-pay

## 2023-01-11 ENCOUNTER — Encounter: Payer: Self-pay | Admitting: Family Medicine

## 2023-01-11 ENCOUNTER — Ambulatory Visit: Payer: Commercial Managed Care - PPO | Attending: Family Medicine | Admitting: Pharmacist

## 2023-01-11 ENCOUNTER — Other Ambulatory Visit (HOSPITAL_COMMUNITY): Payer: Self-pay

## 2023-01-11 ENCOUNTER — Ambulatory Visit: Payer: Commercial Managed Care - PPO | Admitting: Family Medicine

## 2023-01-11 VITALS — BP 110/76 | HR 78 | Temp 97.9°F | Wt 147.1 lb

## 2023-01-11 DIAGNOSIS — F900 Attention-deficit hyperactivity disorder, predominantly inattentive type: Secondary | ICD-10-CM | POA: Diagnosis not present

## 2023-01-11 DIAGNOSIS — F4310 Post-traumatic stress disorder, unspecified: Secondary | ICD-10-CM | POA: Diagnosis not present

## 2023-01-11 DIAGNOSIS — M545 Low back pain, unspecified: Secondary | ICD-10-CM | POA: Diagnosis not present

## 2023-01-11 DIAGNOSIS — Z79899 Other long term (current) drug therapy: Secondary | ICD-10-CM | POA: Diagnosis not present

## 2023-01-11 DIAGNOSIS — F332 Major depressive disorder, recurrent severe without psychotic features: Secondary | ICD-10-CM | POA: Diagnosis not present

## 2023-01-11 DIAGNOSIS — F411 Generalized anxiety disorder: Secondary | ICD-10-CM | POA: Diagnosis not present

## 2023-01-11 DIAGNOSIS — Z5181 Encounter for therapeutic drug level monitoring: Secondary | ICD-10-CM | POA: Diagnosis not present

## 2023-01-11 DIAGNOSIS — Z7189 Other specified counseling: Secondary | ICD-10-CM

## 2023-01-11 DIAGNOSIS — F4312 Post-traumatic stress disorder, chronic: Secondary | ICD-10-CM | POA: Diagnosis not present

## 2023-01-11 MED ORDER — METHYLPREDNISOLONE 4 MG PO TBPK
ORAL_TABLET | ORAL | 0 refills | Status: DC
  Filled 2023-01-11: qty 21, fill #0
  Filled 2023-01-11: qty 21, 6d supply, fill #0

## 2023-01-11 MED ORDER — BUSPIRONE HCL 30 MG PO TABS
30.0000 mg | ORAL_TABLET | Freq: Two times a day (BID) | ORAL | 3 refills | Status: DC
  Filled 2023-01-11: qty 60, 30d supply, fill #0
  Filled 2023-02-09: qty 60, 30d supply, fill #1
  Filled 2023-04-13: qty 60, 30d supply, fill #2
  Filled 2023-05-08: qty 60, 30d supply, fill #3

## 2023-01-11 MED ORDER — BUPROPION HCL ER (XL) 150 MG PO TB24
150.0000 mg | ORAL_TABLET | Freq: Every day | ORAL | 0 refills | Status: DC
  Filled 2023-01-11: qty 30, 30d supply, fill #0

## 2023-01-11 MED ORDER — RINVOQ 15 MG PO TB24
ORAL_TABLET | ORAL | 2 refills | Status: DC
  Filled 2023-01-11: qty 30, fill #0
  Filled 2023-01-25: qty 30, 30d supply, fill #0
  Filled 2023-02-27: qty 30, 30d supply, fill #1
  Filled 2023-03-21: qty 30, 30d supply, fill #2

## 2023-01-11 MED ORDER — METHYLPHENIDATE HCL ER (OSM) 36 MG PO TBCR
36.0000 mg | EXTENDED_RELEASE_TABLET | Freq: Every day | ORAL | 0 refills | Status: DC
Start: 2023-01-11 — End: 2023-02-24
  Filled 2023-01-11: qty 30, 30d supply, fill #0

## 2023-01-11 MED ORDER — CYCLOBENZAPRINE HCL 10 MG PO TABS
10.0000 mg | ORAL_TABLET | Freq: Three times a day (TID) | ORAL | 1 refills | Status: DC | PRN
  Filled 2023-01-11 (×2): qty 60, 20d supply, fill #0

## 2023-01-11 NOTE — Progress Notes (Signed)
  S: Patient presents today for review of their specialty medication.   Patient is taking Rinvoq for rheumatoid arthritis. Patient is managed by Azucena Fallen for this.   Dosing: Adult  Note: May be used as monotherapy or in combination with methotrexate or other nonbiologic disease-modifying antirheumatic drugs (DMARDs); use in combination with biologic DMARDS or potent immunosuppressants (eg, azathioprine, cyclosporine) is not recommended. Do not initiate therapy in patients with an absolute lymphocyte count <500/mm3, ANC <1,000/mm3, or hemoglobin <8 g/dL. Rheumatoid arthritis: Oral: 15 mg once daily.  Adherence: confirmed  Efficacy: reports that the medication works well for her   Monitoring: S/sx thromboembolism: none   S/sx malignancy: none S/sx of infection: none  Current adverse effects: none  O:   Lab Results  Component Value Date   WBC 7.1 12/01/2021   HGB 12.6 12/01/2021   HCT 37.4 12/01/2021   MCV 94 12/01/2021   PLT 276 12/01/2021      Chemistry      Component Value Date/Time   NA 140 06/15/2022 0840   K 4.3 06/15/2022 0840   CL 102 06/15/2022 0840   CO2 20 06/15/2022 0840   BUN 15 06/15/2022 0840   CREATININE 0.80 06/15/2022 0840   CREATININE 0.87 12/19/2019 0946      Component Value Date/Time   CALCIUM 9.3 06/15/2022 0840   ALKPHOS 51 06/15/2022 0840   AST 27 06/15/2022 0840   ALT 21 06/15/2022 0840   BILITOT <0.2 06/15/2022 0840      Lab Results  Component Value Date   CHOL 258 (H) 06/15/2022   HDL 141 06/15/2022   LDLCALC 100 (H) 06/15/2022   TRIG 103 06/15/2022   CHOLHDL 2.2 12/01/2021    A/P: 1. Medication review: patient currently taking Rinvoq for rheumatoid arthritis. Reviewed the medication with the patient, including the following: Rinvoq is a medication used to treat rheumatoid arthritis. Administer with or without food. Swallow tablet whole; do not crush, split, or chew. Possible adverse effects include increased risk of  infection, GI upset, hematologic toxicity, hepatic effects, lipid abnormalities, increased risk of malignancy, thromboembolism. Avoid live vaccinations. No recommendations for any changes.  Butch Penny, PharmD, Patsy Baltimore, CPP Clinical Pharmacist [facility] [phone]

## 2023-01-11 NOTE — Progress Notes (Signed)
Subjective:    Patient ID: [name], female    DOB: 01/27/1986, 37 y.o.   MRN: [id number]  HPI Here for 2 weeks of pain in the right lower back. No radiation to the legs. She has tried taking 800 mg Ibuprofen and applying heat with no relief. 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    Review of Systems  Constitutional: Negative.   Respiratory: Negative.    Cardiovascular: Negative.   Musculoskeletal:  Positive for back pain.       Objective:   Physical Exam Constitutional:      General: She is not in acute distress.    Appearance: Normal appearance.  Cardiovascular:     Rate and Rhythm: Normal rate and regular rhythm.     Pulses: Normal pulses.     Heart sounds: Normal heart sounds.  Pulmonary:     Effort: Pulmonary effort is normal.     Breath sounds: Normal breath sounds.  Musculoskeletal:     Comments: She is tender along the right side of the lower back. ROM is full, although extension is painful. SLR are negative.   Neurological:     Mental Status: She is alert.           Assessment & Plan:  Low back pain, she will take a Medrol dose pack and add Flexeril as needed. Gershon Crane, MD

## 2023-01-12 ENCOUNTER — Other Ambulatory Visit (HOSPITAL_COMMUNITY): Payer: Self-pay

## 2023-01-14 ENCOUNTER — Encounter (HOSPITAL_COMMUNITY): Payer: Self-pay

## 2023-01-17 ENCOUNTER — Other Ambulatory Visit: Payer: Self-pay

## 2023-01-18 DIAGNOSIS — F900 Attention-deficit hyperactivity disorder, predominantly inattentive type: Secondary | ICD-10-CM | POA: Diagnosis not present

## 2023-01-18 DIAGNOSIS — F332 Major depressive disorder, recurrent severe without psychotic features: Secondary | ICD-10-CM | POA: Diagnosis not present

## 2023-01-18 DIAGNOSIS — F411 Generalized anxiety disorder: Secondary | ICD-10-CM | POA: Diagnosis not present

## 2023-01-18 DIAGNOSIS — F4312 Post-traumatic stress disorder, chronic: Secondary | ICD-10-CM | POA: Diagnosis not present

## 2023-01-18 DIAGNOSIS — F4310 Post-traumatic stress disorder, unspecified: Secondary | ICD-10-CM | POA: Diagnosis not present

## 2023-01-24 ENCOUNTER — Other Ambulatory Visit: Payer: Self-pay

## 2023-01-25 ENCOUNTER — Other Ambulatory Visit: Payer: Self-pay

## 2023-01-25 ENCOUNTER — Other Ambulatory Visit (HOSPITAL_COMMUNITY): Payer: Self-pay

## 2023-01-25 ENCOUNTER — Other Ambulatory Visit: Payer: Self-pay | Admitting: Pharmacist

## 2023-01-25 ENCOUNTER — Encounter (HOSPITAL_COMMUNITY): Payer: Self-pay

## 2023-01-25 DIAGNOSIS — Z7189 Other specified counseling: Secondary | ICD-10-CM

## 2023-01-25 NOTE — Progress Notes (Signed)
Specialty Pharmacy Refill Coordination Note  [name] is a 37 y.o. female contacted today regarding refills of specialty medication(s) Upadacitinib   Patient requested Delivery   Delivery date: 01/31/23   Verified address: [address] [place] [address]   Medication will be filled on 01/30/27.

## 2023-01-25 NOTE — Progress Notes (Signed)
See documentation from 01/11/2023 for clinical note and counseling.

## 2023-01-26 ENCOUNTER — Other Ambulatory Visit (HOSPITAL_COMMUNITY): Payer: Self-pay

## 2023-01-26 MED ORDER — REXULTI 1 MG PO TABS
1.0000 mg | ORAL_TABLET | Freq: Every day | ORAL | 0 refills | Status: DC
  Filled 2023-01-26: qty 30, 30d supply, fill #0

## 2023-02-01 DIAGNOSIS — F411 Generalized anxiety disorder: Secondary | ICD-10-CM | POA: Diagnosis not present

## 2023-02-01 DIAGNOSIS — F900 Attention-deficit hyperactivity disorder, predominantly inattentive type: Secondary | ICD-10-CM | POA: Diagnosis not present

## 2023-02-01 DIAGNOSIS — F332 Major depressive disorder, recurrent severe without psychotic features: Secondary | ICD-10-CM | POA: Diagnosis not present

## 2023-02-01 DIAGNOSIS — F4310 Post-traumatic stress disorder, unspecified: Secondary | ICD-10-CM | POA: Diagnosis not present

## 2023-02-02 ENCOUNTER — Other Ambulatory Visit (HOSPITAL_COMMUNITY): Payer: Self-pay

## 2023-02-02 ENCOUNTER — Ambulatory Visit: Payer: Commercial Managed Care - PPO | Admitting: Family Medicine

## 2023-02-02 ENCOUNTER — Encounter: Payer: Self-pay | Admitting: Family Medicine

## 2023-02-02 ENCOUNTER — Other Ambulatory Visit: Payer: Self-pay

## 2023-02-02 VITALS — BP 110/72 | HR 92 | Temp 98.2°F | Wt 148.0 lb

## 2023-02-02 DIAGNOSIS — J029 Acute pharyngitis, unspecified: Secondary | ICD-10-CM | POA: Diagnosis not present

## 2023-02-02 DIAGNOSIS — J020 Streptococcal pharyngitis: Secondary | ICD-10-CM

## 2023-02-02 LAB — POCT RAPID STREP A (OFFICE): Rapid Strep A Screen: POSITIVE — AB

## 2023-02-02 MED ORDER — CEFUROXIME AXETIL 500 MG PO TABS
500.0000 mg | ORAL_TABLET | Freq: Two times a day (BID) | ORAL | 0 refills | Status: AC
  Filled 2023-02-02 (×2): qty 20, 10d supply, fill #0

## 2023-02-02 NOTE — Progress Notes (Signed)
Subjective:    Patient ID: [name], female    DOB: October 21, 1985, 37 y.o.   MRN: [id number]  HPI Here for 3 days of a ST, and this morning she saw white spots in her throat. No fever or coughing.    Review of Systems  Constitutional: Negative.   HENT:  Positive for sore throat. Negative for congestion, ear pain, postnasal drip and sinus pressure.   Eyes: Negative.   Respiratory: Negative.         Objective:   Physical Exam Constitutional:      Appearance: Normal appearance. She is not ill-appearing.  HENT:     Right Ear: Tympanic membrane, ear canal and external ear normal.     Left Ear: Tympanic membrane, ear canal and external ear normal.     Nose: Nose normal.     Mouth/Throat:     Pharynx: Posterior oropharyngeal erythema present. No oropharyngeal exudate.  Eyes:     Conjunctiva/sclera: Conjunctivae normal.  Pulmonary:     Effort: Pulmonary effort is normal.     Breath sounds: Normal breath sounds.  Lymphadenopathy:     Cervical: No cervical adenopathy.  Neurological:     Mental Status: She is alert.           Assessment & Plan:  Strep pharyngitis, treat with 10 days of Cefuroxime.  Gershon Crane, MD

## 2023-02-07 ENCOUNTER — Other Ambulatory Visit (HOSPITAL_COMMUNITY): Payer: Self-pay

## 2023-02-07 DIAGNOSIS — F900 Attention-deficit hyperactivity disorder, predominantly inattentive type: Secondary | ICD-10-CM | POA: Diagnosis not present

## 2023-02-07 DIAGNOSIS — F332 Major depressive disorder, recurrent severe without psychotic features: Secondary | ICD-10-CM | POA: Diagnosis not present

## 2023-02-07 DIAGNOSIS — F4312 Post-traumatic stress disorder, chronic: Secondary | ICD-10-CM | POA: Diagnosis not present

## 2023-02-07 DIAGNOSIS — F411 Generalized anxiety disorder: Secondary | ICD-10-CM | POA: Diagnosis not present

## 2023-02-07 MED ORDER — ONDANSETRON 4 MG PO TBDP
4.0000 mg | ORAL_TABLET | Freq: Three times a day (TID) | ORAL | 0 refills | Status: DC | PRN
  Filled 2023-02-07 – 2023-02-20 (×2): qty 90, 30d supply, fill #0

## 2023-02-09 ENCOUNTER — Other Ambulatory Visit (HOSPITAL_COMMUNITY): Payer: Self-pay

## 2023-02-09 ENCOUNTER — Other Ambulatory Visit: Payer: Self-pay

## 2023-02-09 ENCOUNTER — Encounter (HOSPITAL_COMMUNITY): Payer: Self-pay | Admitting: Pharmacist

## 2023-02-09 MED ORDER — BUPROPION HCL ER (XL) 150 MG PO TB24
150.0000 mg | ORAL_TABLET | Freq: Every day | ORAL | 0 refills | Status: DC
  Filled 2023-02-09: qty 30, 30d supply, fill #0

## 2023-02-10 ENCOUNTER — Other Ambulatory Visit: Payer: Self-pay

## 2023-02-10 DIAGNOSIS — F900 Attention-deficit hyperactivity disorder, predominantly inattentive type: Secondary | ICD-10-CM | POA: Diagnosis not present

## 2023-02-10 DIAGNOSIS — F411 Generalized anxiety disorder: Secondary | ICD-10-CM | POA: Diagnosis not present

## 2023-02-10 DIAGNOSIS — F4310 Post-traumatic stress disorder, unspecified: Secondary | ICD-10-CM | POA: Diagnosis not present

## 2023-02-10 DIAGNOSIS — Z79899 Other long term (current) drug therapy: Secondary | ICD-10-CM | POA: Diagnosis not present

## 2023-02-10 DIAGNOSIS — F332 Major depressive disorder, recurrent severe without psychotic features: Secondary | ICD-10-CM | POA: Diagnosis not present

## 2023-02-20 ENCOUNTER — Other Ambulatory Visit: Payer: Self-pay

## 2023-02-21 ENCOUNTER — Other Ambulatory Visit: Payer: Self-pay

## 2023-02-22 ENCOUNTER — Other Ambulatory Visit (HOSPITAL_COMMUNITY): Payer: Self-pay

## 2023-02-23 ENCOUNTER — Other Ambulatory Visit (HOSPITAL_COMMUNITY): Payer: Self-pay

## 2023-02-23 ENCOUNTER — Other Ambulatory Visit: Payer: Self-pay

## 2023-02-23 MED ORDER — REXULTI 1 MG PO TABS
1.0000 mg | ORAL_TABLET | Freq: Every day | ORAL | 1 refills | Status: DC
  Filled 2023-02-23: qty 30, 30d supply, fill #0
  Filled 2023-04-01: qty 30, 30d supply, fill #1

## 2023-02-24 ENCOUNTER — Other Ambulatory Visit: Payer: Self-pay

## 2023-02-24 ENCOUNTER — Encounter (HOSPITAL_COMMUNITY): Payer: Self-pay

## 2023-02-24 ENCOUNTER — Other Ambulatory Visit (HOSPITAL_COMMUNITY): Payer: Self-pay

## 2023-02-24 DIAGNOSIS — F411 Generalized anxiety disorder: Secondary | ICD-10-CM | POA: Diagnosis not present

## 2023-02-24 DIAGNOSIS — F4312 Post-traumatic stress disorder, chronic: Secondary | ICD-10-CM | POA: Diagnosis not present

## 2023-02-24 DIAGNOSIS — F332 Major depressive disorder, recurrent severe without psychotic features: Secondary | ICD-10-CM | POA: Diagnosis not present

## 2023-02-24 MED ORDER — METHYLPHENIDATE HCL ER (OSM) 36 MG PO TBCR
36.0000 mg | EXTENDED_RELEASE_TABLET | Freq: Every day | ORAL | 0 refills | Status: DC
  Filled 2023-02-24: qty 30, 30d supply, fill #0

## 2023-02-27 ENCOUNTER — Other Ambulatory Visit: Payer: Self-pay

## 2023-02-27 NOTE — Progress Notes (Signed)
Specialty Pharmacy Refill Coordination Note  [name] is a 37 y.o. female contacted today regarding refills of specialty medication(s) Upadacitinib   Patient requested Delivery   Delivery date: 03/02/23   Verified address: [address]   [place] [address]   Medication will be filled on 03/01/23.

## 2023-03-02 DIAGNOSIS — F332 Major depressive disorder, recurrent severe without psychotic features: Secondary | ICD-10-CM | POA: Diagnosis not present

## 2023-03-02 DIAGNOSIS — F4310 Post-traumatic stress disorder, unspecified: Secondary | ICD-10-CM | POA: Diagnosis not present

## 2023-03-02 DIAGNOSIS — F411 Generalized anxiety disorder: Secondary | ICD-10-CM | POA: Diagnosis not present

## 2023-03-02 DIAGNOSIS — F4312 Post-traumatic stress disorder, chronic: Secondary | ICD-10-CM | POA: Diagnosis not present

## 2023-03-10 ENCOUNTER — Other Ambulatory Visit (HOSPITAL_COMMUNITY): Payer: Self-pay

## 2023-03-13 ENCOUNTER — Other Ambulatory Visit (HOSPITAL_COMMUNITY): Payer: Self-pay

## 2023-03-13 ENCOUNTER — Other Ambulatory Visit: Payer: Self-pay

## 2023-03-13 DIAGNOSIS — F332 Major depressive disorder, recurrent severe without psychotic features: Secondary | ICD-10-CM | POA: Diagnosis not present

## 2023-03-13 DIAGNOSIS — F4310 Post-traumatic stress disorder, unspecified: Secondary | ICD-10-CM | POA: Diagnosis not present

## 2023-03-13 MED ORDER — BUPROPION HCL ER (XL) 150 MG PO TB24
150.0000 mg | ORAL_TABLET | Freq: Every day | ORAL | 0 refills | Status: DC
  Filled 2023-03-13: qty 30, 30d supply, fill #0

## 2023-03-16 DIAGNOSIS — F332 Major depressive disorder, recurrent severe without psychotic features: Secondary | ICD-10-CM | POA: Diagnosis not present

## 2023-03-16 DIAGNOSIS — F411 Generalized anxiety disorder: Secondary | ICD-10-CM | POA: Diagnosis not present

## 2023-03-17 ENCOUNTER — Other Ambulatory Visit: Payer: Self-pay

## 2023-03-21 ENCOUNTER — Other Ambulatory Visit: Payer: Self-pay

## 2023-03-21 NOTE — Progress Notes (Signed)
Patient confirmed update delivery date with [employer].

## 2023-03-21 NOTE — Progress Notes (Signed)
Specialty Pharmacy Refill Coordination Note  [name] is a 37 y.o. female contacted today regarding refills of specialty medication(s) Upadacitinib   Patient requested Delivery   Delivery date:  (unable to fulfill requested delivery date. - lvm for patient medication will mail 12/2)   Verified address: [address], [place], [place] [address]   Medication will be filled on 03/27/23 due to Thanksgiving holiday.

## 2023-03-27 ENCOUNTER — Other Ambulatory Visit: Payer: Self-pay

## 2023-03-30 DIAGNOSIS — F411 Generalized anxiety disorder: Secondary | ICD-10-CM | POA: Diagnosis not present

## 2023-03-30 DIAGNOSIS — F4312 Post-traumatic stress disorder, chronic: Secondary | ICD-10-CM | POA: Diagnosis not present

## 2023-03-30 DIAGNOSIS — F332 Major depressive disorder, recurrent severe without psychotic features: Secondary | ICD-10-CM | POA: Diagnosis not present

## 2023-03-30 DIAGNOSIS — F4310 Post-traumatic stress disorder, unspecified: Secondary | ICD-10-CM | POA: Diagnosis not present

## 2023-03-30 DIAGNOSIS — F900 Attention-deficit hyperactivity disorder, predominantly inattentive type: Secondary | ICD-10-CM | POA: Diagnosis not present

## 2023-04-01 ENCOUNTER — Other Ambulatory Visit (HOSPITAL_COMMUNITY): Payer: Self-pay

## 2023-04-03 ENCOUNTER — Other Ambulatory Visit: Payer: Self-pay

## 2023-04-03 ENCOUNTER — Other Ambulatory Visit (HOSPITAL_COMMUNITY): Payer: Self-pay

## 2023-04-03 MED ORDER — METHYLPHENIDATE HCL ER (OSM) 36 MG PO TBCR
36.0000 mg | EXTENDED_RELEASE_TABLET | Freq: Every day | ORAL | 0 refills | Status: DC
  Filled 2023-04-03: qty 30, 30d supply, fill #0

## 2023-04-03 MED ORDER — DROSPIRENONE-ETHINYL ESTRADIOL 3-0.03 MG PO TABS
1.0000 | ORAL_TABLET | Freq: Every day | ORAL | 0 refills | Status: DC
  Filled 2023-04-03: qty 112, 84d supply, fill #0

## 2023-04-04 ENCOUNTER — Other Ambulatory Visit: Payer: Self-pay

## 2023-04-05 ENCOUNTER — Other Ambulatory Visit (HOSPITAL_COMMUNITY): Payer: Self-pay

## 2023-04-05 ENCOUNTER — Other Ambulatory Visit: Payer: Self-pay

## 2023-04-05 DIAGNOSIS — F900 Attention-deficit hyperactivity disorder, predominantly inattentive type: Secondary | ICD-10-CM | POA: Diagnosis not present

## 2023-04-05 DIAGNOSIS — Z6827 Body mass index (BMI) 27.0-27.9, adult: Secondary | ICD-10-CM | POA: Diagnosis not present

## 2023-04-05 DIAGNOSIS — E663 Overweight: Secondary | ICD-10-CM | POA: Diagnosis not present

## 2023-04-05 DIAGNOSIS — R5382 Chronic fatigue, unspecified: Secondary | ICD-10-CM | POA: Diagnosis not present

## 2023-04-05 DIAGNOSIS — M0609 Rheumatoid arthritis without rheumatoid factor, multiple sites: Secondary | ICD-10-CM | POA: Diagnosis not present

## 2023-04-05 DIAGNOSIS — F4312 Post-traumatic stress disorder, chronic: Secondary | ICD-10-CM | POA: Diagnosis not present

## 2023-04-05 DIAGNOSIS — F4310 Post-traumatic stress disorder, unspecified: Secondary | ICD-10-CM | POA: Diagnosis not present

## 2023-04-05 DIAGNOSIS — F332 Major depressive disorder, recurrent severe without psychotic features: Secondary | ICD-10-CM | POA: Diagnosis not present

## 2023-04-05 DIAGNOSIS — F411 Generalized anxiety disorder: Secondary | ICD-10-CM | POA: Diagnosis not present

## 2023-04-05 MED ORDER — LORAZEPAM 1 MG PO TABS
1.0000 mg | ORAL_TABLET | Freq: Every day | ORAL | 1 refills | Status: DC | PRN
  Filled 2023-04-05 – 2023-05-08 (×2): qty 30, 30d supply, fill #0
  Filled 2023-08-23: qty 30, 30d supply, fill #1

## 2023-04-10 ENCOUNTER — Other Ambulatory Visit (HOSPITAL_COMMUNITY): Payer: Self-pay

## 2023-04-13 ENCOUNTER — Other Ambulatory Visit (HOSPITAL_COMMUNITY): Payer: Self-pay

## 2023-04-13 DIAGNOSIS — F411 Generalized anxiety disorder: Secondary | ICD-10-CM | POA: Diagnosis not present

## 2023-04-13 DIAGNOSIS — F900 Attention-deficit hyperactivity disorder, predominantly inattentive type: Secondary | ICD-10-CM | POA: Diagnosis not present

## 2023-04-13 DIAGNOSIS — F4310 Post-traumatic stress disorder, unspecified: Secondary | ICD-10-CM | POA: Diagnosis not present

## 2023-04-13 DIAGNOSIS — F332 Major depressive disorder, recurrent severe without psychotic features: Secondary | ICD-10-CM | POA: Diagnosis not present

## 2023-04-19 DIAGNOSIS — N3000 Acute cystitis without hematuria: Secondary | ICD-10-CM | POA: Diagnosis not present

## 2023-04-19 DIAGNOSIS — R350 Frequency of micturition: Secondary | ICD-10-CM | POA: Diagnosis not present

## 2023-04-20 ENCOUNTER — Other Ambulatory Visit: Payer: Self-pay

## 2023-04-20 ENCOUNTER — Other Ambulatory Visit: Payer: Self-pay | Admitting: Pharmacist

## 2023-04-20 MED ORDER — RINVOQ 15 MG PO TB24: ORAL_TABLET | ORAL | 2 refills | Status: DC

## 2023-04-20 MED ORDER — RINVOQ 15 MG PO TB24
ORAL_TABLET | ORAL | 2 refills | Status: DC
  Filled 2023-04-20: qty 30, 30d supply, fill #0
  Filled 2023-05-24: qty 30, 30d supply, fill #1
  Filled 2023-06-16: qty 30, 30d supply, fill #2

## 2023-04-20 NOTE — Progress Notes (Signed)
Specialty Pharmacy Refill Coordination Note  [name] is a 37 y.o. female contacted today regarding refills of specialty medication(s) Upadacitinib (Rinvoq)   Patient requested Delivery   Delivery date: 04/25/23   Verified address: [address], [place], [place] [address]   Medication will be filled on 04/24/23.

## 2023-04-21 ENCOUNTER — Other Ambulatory Visit: Payer: Self-pay

## 2023-04-21 ENCOUNTER — Other Ambulatory Visit (HOSPITAL_COMMUNITY): Payer: Self-pay

## 2023-04-21 MED ORDER — AUVELITY 45-105 MG PO TBCR
1.0000 | EXTENDED_RELEASE_TABLET | Freq: Two times a day (BID) | ORAL | 0 refills | Status: DC
  Filled 2023-04-21: qty 60, 30d supply, fill #0

## 2023-04-24 ENCOUNTER — Other Ambulatory Visit: Payer: Self-pay

## 2023-04-28 ENCOUNTER — Telehealth: Payer: Commercial Managed Care - PPO | Admitting: Family Medicine

## 2023-04-28 ENCOUNTER — Telehealth: Payer: Commercial Managed Care - PPO

## 2023-04-28 DIAGNOSIS — T3695XA Adverse effect of unspecified systemic antibiotic, initial encounter: Secondary | ICD-10-CM | POA: Diagnosis not present

## 2023-04-28 DIAGNOSIS — B379 Candidiasis, unspecified: Secondary | ICD-10-CM

## 2023-04-28 DIAGNOSIS — N390 Urinary tract infection, site not specified: Secondary | ICD-10-CM | POA: Diagnosis not present

## 2023-04-28 MED ORDER — FLUCONAZOLE 150 MG PO TABS: 150.0000 mg | ORAL_TABLET | ORAL | 0 refills | Status: DC

## 2023-04-28 MED ORDER — CEPHALEXIN 500 MG PO CAPS: 500.0000 mg | ORAL_CAPSULE | Freq: Two times a day (BID) | ORAL | 0 refills | Status: AC

## 2023-04-28 NOTE — Progress Notes (Signed)

## 2023-05-01 DIAGNOSIS — F900 Attention-deficit hyperactivity disorder, predominantly inattentive type: Secondary | ICD-10-CM | POA: Diagnosis not present

## 2023-05-01 DIAGNOSIS — F332 Major depressive disorder, recurrent severe without psychotic features: Secondary | ICD-10-CM | POA: Diagnosis not present

## 2023-05-01 DIAGNOSIS — F4312 Post-traumatic stress disorder, chronic: Secondary | ICD-10-CM | POA: Diagnosis not present

## 2023-05-01 DIAGNOSIS — F411 Generalized anxiety disorder: Secondary | ICD-10-CM | POA: Diagnosis not present

## 2023-05-08 ENCOUNTER — Other Ambulatory Visit (HOSPITAL_COMMUNITY): Payer: Self-pay

## 2023-05-08 ENCOUNTER — Other Ambulatory Visit: Payer: Self-pay

## 2023-05-09 ENCOUNTER — Other Ambulatory Visit (HOSPITAL_COMMUNITY): Payer: Self-pay

## 2023-05-09 MED ORDER — METHYLPHENIDATE HCL ER (OSM) 36 MG PO TBCR
36.0000 mg | EXTENDED_RELEASE_TABLET | Freq: Every day | ORAL | 0 refills | Status: DC
  Filled 2023-05-09: qty 30, 30d supply, fill #0

## 2023-05-10 ENCOUNTER — Other Ambulatory Visit: Payer: Self-pay

## 2023-05-11 ENCOUNTER — Other Ambulatory Visit: Payer: Self-pay

## 2023-05-12 ENCOUNTER — Other Ambulatory Visit (HOSPITAL_COMMUNITY): Payer: Self-pay

## 2023-05-12 ENCOUNTER — Other Ambulatory Visit: Payer: Self-pay

## 2023-05-12 DIAGNOSIS — F900 Attention-deficit hyperactivity disorder, predominantly inattentive type: Secondary | ICD-10-CM | POA: Diagnosis not present

## 2023-05-12 DIAGNOSIS — F332 Major depressive disorder, recurrent severe without psychotic features: Secondary | ICD-10-CM | POA: Diagnosis not present

## 2023-05-12 DIAGNOSIS — F411 Generalized anxiety disorder: Secondary | ICD-10-CM | POA: Diagnosis not present

## 2023-05-12 DIAGNOSIS — F4310 Post-traumatic stress disorder, unspecified: Secondary | ICD-10-CM | POA: Diagnosis not present

## 2023-05-12 MED ORDER — REXULTI 1 MG PO TABS
1.0000 mg | ORAL_TABLET | Freq: Every day | ORAL | 2 refills | Status: DC
  Filled 2023-05-12: qty 30, 30d supply, fill #0
  Filled 2023-06-07: qty 30, 30d supply, fill #1
  Filled 2023-07-08: qty 30, 30d supply, fill #2

## 2023-05-15 ENCOUNTER — Other Ambulatory Visit: Payer: Self-pay

## 2023-05-17 ENCOUNTER — Other Ambulatory Visit (HOSPITAL_COMMUNITY): Payer: Self-pay

## 2023-05-17 DIAGNOSIS — Z139 Encounter for screening, unspecified: Secondary | ICD-10-CM | POA: Diagnosis not present

## 2023-05-17 DIAGNOSIS — F411 Generalized anxiety disorder: Secondary | ICD-10-CM | POA: Diagnosis not present

## 2023-05-17 DIAGNOSIS — Z124 Encounter for screening for malignant neoplasm of cervix: Secondary | ICD-10-CM | POA: Diagnosis not present

## 2023-05-17 DIAGNOSIS — Z01419 Encounter for gynecological examination (general) (routine) without abnormal findings: Secondary | ICD-10-CM | POA: Diagnosis not present

## 2023-05-17 DIAGNOSIS — F900 Attention-deficit hyperactivity disorder, predominantly inattentive type: Secondary | ICD-10-CM | POA: Diagnosis not present

## 2023-05-17 DIAGNOSIS — Z6827 Body mass index (BMI) 27.0-27.9, adult: Secondary | ICD-10-CM | POA: Diagnosis not present

## 2023-05-17 DIAGNOSIS — F4312 Post-traumatic stress disorder, chronic: Secondary | ICD-10-CM | POA: Diagnosis not present

## 2023-05-17 DIAGNOSIS — Z309 Encounter for contraceptive management, unspecified: Secondary | ICD-10-CM | POA: Diagnosis not present

## 2023-05-17 DIAGNOSIS — F332 Major depressive disorder, recurrent severe without psychotic features: Secondary | ICD-10-CM | POA: Diagnosis not present

## 2023-05-17 MED ORDER — DROSPIRENONE-ETHINYL ESTRADIOL 3-0.03 MG PO TABS
1.0000 | ORAL_TABLET | Freq: Every day | ORAL | 3 refills | Status: AC
  Filled 2023-05-17 – 2023-07-05 (×2): qty 112, 84d supply, fill #0
  Filled 2023-09-24: qty 112, 84d supply, fill #1
  Filled 2023-12-16: qty 112, 84d supply, fill #2
  Filled 2024-03-10: qty 112, 84d supply, fill #3

## 2023-05-18 ENCOUNTER — Other Ambulatory Visit: Payer: Self-pay

## 2023-05-24 ENCOUNTER — Other Ambulatory Visit: Payer: Self-pay

## 2023-05-24 NOTE — Progress Notes (Signed)
Specialty Pharmacy Refill Coordination Note  [name] is a 38 y.o. female contacted today regarding refills of specialty medication(s) Upadacitinib (Rinvoq)   Patient requested Delivery   Delivery date: 05/25/23   Verified address: [address]  [place] [address]   Medication will be filled on 05/24/23.

## 2023-05-30 DIAGNOSIS — F332 Major depressive disorder, recurrent severe without psychotic features: Secondary | ICD-10-CM | POA: Diagnosis not present

## 2023-05-30 DIAGNOSIS — F4310 Post-traumatic stress disorder, unspecified: Secondary | ICD-10-CM | POA: Diagnosis not present

## 2023-06-07 ENCOUNTER — Other Ambulatory Visit (HOSPITAL_COMMUNITY): Payer: Self-pay

## 2023-06-07 ENCOUNTER — Other Ambulatory Visit: Payer: Self-pay

## 2023-06-07 MED ORDER — METHYLPHENIDATE HCL ER (OSM) 36 MG PO TBCR
36.0000 mg | EXTENDED_RELEASE_TABLET | Freq: Every day | ORAL | 0 refills | Status: DC
  Filled 2023-06-07: qty 30, 30d supply, fill #0

## 2023-06-08 ENCOUNTER — Other Ambulatory Visit (HOSPITAL_COMMUNITY): Payer: Self-pay

## 2023-06-08 DIAGNOSIS — F411 Generalized anxiety disorder: Secondary | ICD-10-CM | POA: Diagnosis not present

## 2023-06-08 DIAGNOSIS — F900 Attention-deficit hyperactivity disorder, predominantly inattentive type: Secondary | ICD-10-CM | POA: Diagnosis not present

## 2023-06-08 DIAGNOSIS — F4310 Post-traumatic stress disorder, unspecified: Secondary | ICD-10-CM | POA: Diagnosis not present

## 2023-06-08 DIAGNOSIS — F332 Major depressive disorder, recurrent severe without psychotic features: Secondary | ICD-10-CM | POA: Diagnosis not present

## 2023-06-09 ENCOUNTER — Other Ambulatory Visit (HOSPITAL_COMMUNITY): Payer: Self-pay

## 2023-06-09 ENCOUNTER — Other Ambulatory Visit: Payer: Self-pay

## 2023-06-09 MED ORDER — AUVELITY 45-105 MG PO TBCR
1.0000 | EXTENDED_RELEASE_TABLET | Freq: Two times a day (BID) | ORAL | 1 refills | Status: DC
  Filled 2023-06-09: qty 60, 30d supply, fill #0
  Filled 2023-12-14: qty 60, 30d supply, fill #1

## 2023-06-13 ENCOUNTER — Other Ambulatory Visit: Payer: Self-pay

## 2023-06-13 ENCOUNTER — Other Ambulatory Visit (HOSPITAL_COMMUNITY): Payer: Self-pay

## 2023-06-16 ENCOUNTER — Other Ambulatory Visit: Payer: Self-pay

## 2023-06-16 NOTE — Progress Notes (Signed)
Specialty Pharmacy Refill Coordination Note  [name] is a 38 y.o. female contacted today regarding refills of specialty medication(s) Upadacitinib (Rinvoq)   Patient requested (Patient-Rptd) Delivery   Delivery date: (Patient-Rptd) 06/20/23   Verified address: (Patient-Rptd) [address], [place], [place] [address]   Medication will be filled on 02.24.25.

## 2023-06-19 ENCOUNTER — Other Ambulatory Visit: Payer: Self-pay

## 2023-06-19 ENCOUNTER — Other Ambulatory Visit (HOSPITAL_COMMUNITY): Payer: Self-pay

## 2023-06-22 DIAGNOSIS — Z0289 Encounter for other administrative examinations: Secondary | ICD-10-CM

## 2023-06-22 DIAGNOSIS — Z5181 Encounter for therapeutic drug level monitoring: Secondary | ICD-10-CM | POA: Diagnosis not present

## 2023-06-22 DIAGNOSIS — F332 Major depressive disorder, recurrent severe without psychotic features: Secondary | ICD-10-CM | POA: Diagnosis not present

## 2023-06-22 DIAGNOSIS — F4312 Post-traumatic stress disorder, chronic: Secondary | ICD-10-CM | POA: Diagnosis not present

## 2023-06-22 DIAGNOSIS — F900 Attention-deficit hyperactivity disorder, predominantly inattentive type: Secondary | ICD-10-CM | POA: Diagnosis not present

## 2023-06-22 DIAGNOSIS — F411 Generalized anxiety disorder: Secondary | ICD-10-CM | POA: Diagnosis not present

## 2023-07-05 ENCOUNTER — Other Ambulatory Visit (HOSPITAL_COMMUNITY): Payer: Self-pay

## 2023-07-05 ENCOUNTER — Other Ambulatory Visit: Payer: Self-pay

## 2023-07-05 DIAGNOSIS — F411 Generalized anxiety disorder: Secondary | ICD-10-CM | POA: Diagnosis not present

## 2023-07-05 DIAGNOSIS — M0609 Rheumatoid arthritis without rheumatoid factor, multiple sites: Secondary | ICD-10-CM | POA: Diagnosis not present

## 2023-07-05 DIAGNOSIS — F332 Major depressive disorder, recurrent severe without psychotic features: Secondary | ICD-10-CM | POA: Diagnosis not present

## 2023-07-05 DIAGNOSIS — F4310 Post-traumatic stress disorder, unspecified: Secondary | ICD-10-CM | POA: Diagnosis not present

## 2023-07-05 DIAGNOSIS — F900 Attention-deficit hyperactivity disorder, predominantly inattentive type: Secondary | ICD-10-CM | POA: Diagnosis not present

## 2023-07-05 LAB — LAB REPORT - SCANNED: EGFR: 90

## 2023-07-05 MED ORDER — METHYLPHENIDATE HCL ER (OSM) 36 MG PO TBCR
36.0000 mg | EXTENDED_RELEASE_TABLET | Freq: Every day | ORAL | 0 refills | Status: DC
Start: 2023-07-05 — End: 2023-09-28
  Filled 2023-07-06: qty 30, 30d supply, fill #0

## 2023-07-05 MED ORDER — AUVELITY 45-105 MG PO TBCR
1.0000 | EXTENDED_RELEASE_TABLET | Freq: Two times a day (BID) | ORAL | 0 refills | Status: DC
Start: 2023-07-05 — End: 2024-01-03
  Filled 2023-07-05: qty 180, 90d supply, fill #0

## 2023-07-05 MED ORDER — BUSPIRONE HCL 30 MG PO TABS
30.0000 mg | ORAL_TABLET | Freq: Two times a day (BID) | ORAL | 0 refills | Status: DC
Start: 2023-07-05 — End: 2023-10-04
  Filled 2023-07-15: qty 180, 90d supply, fill #0

## 2023-07-06 ENCOUNTER — Other Ambulatory Visit: Payer: Self-pay

## 2023-07-06 DIAGNOSIS — F332 Major depressive disorder, recurrent severe without psychotic features: Secondary | ICD-10-CM | POA: Diagnosis not present

## 2023-07-06 DIAGNOSIS — F4310 Post-traumatic stress disorder, unspecified: Secondary | ICD-10-CM | POA: Diagnosis not present

## 2023-07-08 ENCOUNTER — Other Ambulatory Visit (HOSPITAL_COMMUNITY): Payer: Self-pay

## 2023-07-11 ENCOUNTER — Other Ambulatory Visit: Payer: Self-pay

## 2023-07-13 ENCOUNTER — Other Ambulatory Visit: Payer: Self-pay

## 2023-07-13 ENCOUNTER — Encounter (INDEPENDENT_AMBULATORY_CARE_PROVIDER_SITE_OTHER): Payer: Self-pay | Admitting: Physician Assistant

## 2023-07-13 ENCOUNTER — Ambulatory Visit (INDEPENDENT_AMBULATORY_CARE_PROVIDER_SITE_OTHER): Payer: Self-pay | Admitting: Physician Assistant

## 2023-07-13 VITALS — BP 92/61 | HR 76 | Temp 97.6°F | Ht 60.0 in | Wt 132.0 lb

## 2023-07-13 DIAGNOSIS — F411 Generalized anxiety disorder: Secondary | ICD-10-CM | POA: Diagnosis not present

## 2023-07-13 DIAGNOSIS — F4312 Post-traumatic stress disorder, chronic: Secondary | ICD-10-CM | POA: Diagnosis not present

## 2023-07-13 DIAGNOSIS — E7849 Other hyperlipidemia: Secondary | ICD-10-CM

## 2023-07-13 DIAGNOSIS — M069 Rheumatoid arthritis, unspecified: Secondary | ICD-10-CM

## 2023-07-13 DIAGNOSIS — E559 Vitamin D deficiency, unspecified: Secondary | ICD-10-CM | POA: Diagnosis not present

## 2023-07-13 DIAGNOSIS — E88819 Insulin resistance, unspecified: Secondary | ICD-10-CM

## 2023-07-13 DIAGNOSIS — Z6833 Body mass index (BMI) 33.0-33.9, adult: Secondary | ICD-10-CM | POA: Diagnosis not present

## 2023-07-13 DIAGNOSIS — E66811 Obesity, class 1: Secondary | ICD-10-CM

## 2023-07-13 DIAGNOSIS — R7989 Other specified abnormal findings of blood chemistry: Secondary | ICD-10-CM | POA: Diagnosis not present

## 2023-07-13 NOTE — Progress Notes (Signed)
 SUBJECTIVE: Discussed the use of AI scribe software for clinical note transcription with the patient, who gave verbal consent to proceed.  Chief Complaint: Obesity  Interim History: She is re-establishing with [facility]-Sees  Dr Sharee Holster    Last in OV 12/05/22    She is down 11 lbs since her last visit   Down 39 lbs overall   TBW loss of 22.81%   Last REE 02/25/2021 1771   Journaling 1450-1550 cal/ 110+ grams of protein previously- Now more PC/[place]     Pharmacotherapy: metformin 500 mg lunch/ 1000 mg dinner for insulin resistance- has not been taking since last year          Compounded semaglutide 0.8 mg weekly for the past few months.   HUYEN PERAZZO is a 38 year old female with insulin resistance and rheumatoid arthritis who presents for follow-up on obesity management and wellness.  Since her last visit on December 05, 2022, she has lost 11 pounds, totaling a 39-pound weight loss, resulting in a current BMI of 25.8. Her most recent resting energy expenditure (REE) on February 25, 2021, was 1771 calories. She follows more of portion control/smart choices adhering to this plan about 50% of the time. She exercises at the gym for 40 minutes once a week and focuses on portion control and smart choices in her diet.  Her previous pharmacotherapy for insulin resistance included metformin, 500 mg with lunch and 1000 mg with dinner, and compounded semaglutide 0.5 mg weekly. She has been off metformin for a while but continues to lose weight. She has been on Ozempic 0.8 mg weekly for a few months, having previously been on 0.5 mg. No nausea, vomiting, diarrhea, constipation, difficulty swallowing, lump in the throat, vision changes, or mood changes.  She has a history of rheumatoid arthritis and is currently on Rinvoq, which is working well. Her recent labs from her rheumatologist included a metabolic panel, CBC, and lipid panel. Her cholesterol is elevated, with a high HDL level.  She has a very high HDL  at 136 on her most recent check with Rheumatology 07/05/23.  She has a family history of heart disease, with her father having had stents, a CABG, and a pacemaker, and all her grandparents died from heart disease before she was born.  She is an endoscopy nurse and finds it challenging to maintain a regular exercise routine due to her work schedule and family responsibilities. She has three children, aged 30, 63, and a 96 year old Museum/gallery conservator. She tries to incorporate physical activity by using the elliptical once a week and is considering adding in walking at lunch with ankle weights for 15 minutes 3 times weekly.     Keeya is here to discuss her progress with her obesity treatment plan. She is on the Category 3 Plan and keeping a food journal and adhering to recommended goals of 1450-1550 calories and 110+ grams of protein and states she is following her eating plan approximately 50 % of the time. She states she is exercising 40 minutes 1 times per week.  Plan fasting IC and labs next OV. Advised to come in 30 minutes prior to appt for fasting IC OBJECTIVE: Visit Diagnoses: Problem List Items Addressed This Visit     Vitamin D deficiency   Insulin resistance   Other hyperlipidemia   Class 1 obesity with serious comorbidity and body mass index (BMI) of 33.0 to 33.9 in adult   Other Visit Diagnoses       High serum  high density lipoprotein (HDL)    -  Primary     Obesity  She has successfully lost 39 pounds, achieving a BMI of 25.8. She follows her PC/[place] about 50% of the time and exercises once a week. The focus is on portion control and maintaining muscle mass. She is encouraged to increase protein intake to support muscle maintenance. Given her limited time, incorporating weight training exercises at home and walking with ankle weights during lunch breaks are recommended to maintain muscle mass and support weight management. - Continue current journaling plan and portion control -  Increase protein intake to 110 grams daily  - Incorporate weight training exercises at home using free weights - Consider walking with ankle weights during lunch breaks Will plan to repeat fasting IC to assess REE following her significant weight loss   Hyperlipidemia with High HDL She has high total cholesterol and LDL, with very high HDL- 136.  There is a strong family history of heart disease, and the high HDL may indicate increased cardiovascular risk.  ?Possible genetic component.  Last lipids at [facility]  Lab Results  Component Value Date   CHOL 258 (H) 06/15/2022   HDL 141 06/15/2022   LDLCALC 100 (H) 06/15/2022   TRIG 103 06/15/2022   CHOLHDL 2.2 12/01/2021     Referral to cardiology is considered to evaluate cardiovascular risk and potential need for newer medications. -Referral to cardiology- lipid clinic for evaluation of cardiovascular risk  History of vitamin D deficiency Last vitamin D Lab Results  Component Value Date   VD25OH 70.6 11/17/2022   Plan: Recheck vitamin D levels prior to resumption of supplementation. Low vitamin D levels can be associated with adiposity and may result in leptin resistance and weight gain. Also associated with fatigue.  Currently on vitamin D supplementation without any adverse effects such as nausea, vomiting or muscle weakness.    Rheumatoid Arthritis She is managed on Rinvoq, effectively controlling her symptoms. Sees Rheumatology regularly for monitoring,.  RA is quiescent/well controlled.  Regular monitoring of labs is necessary due to the medication. - Continue Rinvoq for rheumatoid arthritis management per Rheumatology.   Insulin Resistance Last fasting insulin was 8.1. A1c was 5.3. Polyphagia:No Medication(s):  Compounded semaglutide 0.8 mg weekly  Reports no side effects with this. Denies mass in neck, dysphagia, dyspepsia, persistent hoarseness, abdominal pain, or N/V/Constipation or diarrhea. Has annual eye exam. Mood is  stable.    Previously on metformin but stopped .  Lab Results  Component Value Date   HGBA1C 5.3 06/15/2022   HGBA1C 5.0 12/01/2021   HGBA1C 5.3 12/19/2019   Lab Results  Component Value Date   INSULIN 8.1 06/15/2022   INSULIN 6.7 12/01/2021   INSULIN 8.6 02/25/2021   INSULIN 8.1 06/25/2020   INSULIN 8.5 01/23/2020    Plan:  Continue working on nutrition plan to decrease simple carbohydrates, increase lean proteins and exercise to promote weight loss, improve glycemic control and prevent progression to Type 2 diabetes.  She is continuing her usual Cpd semaglutide 0.8 mg weekly for maintenance. Will plan to recheck fasting labs for A1c and insulin levels and assess need to resume metformin at this point,  General Health Maintenance She is advised to maintain hydration and consider healthier alternatives to diet soda. - Encourage adequate water intake - Consider switching to lower caffeine carbonated water as an alternative to diet soda  Follow-up She is scheduled for a follow-up appointment to reassess her metabolic needs and review lab results, including fasting  labs to evaluate her metabolic status and cholesterol levels. - Schedule follow-up appointment on April 28 at 7:40 AM - Arrive 30 minutes early for fasting labs including A1c and other relevant tests  Vitals Temp: 97.6 F (36.4 C) BP: 92/61 Pulse Rate: 76 SpO2: 97 %   Anthropometric Measurements Height: 5' (1.524 m) Weight: 132 lb (59.9 kg) BMI (Calculated): 25.78 Weight at Last Visit: 143lb Weight Lost Since Last Visit: 11lb Weight Gained Since Last Visit: 0 Starting Weight: 171lb Total Weight Loss (lbs): 39 lb (17.7 kg)   Body Composition  Body Fat %: 27.2 % Fat Mass (lbs): 36 lbs Muscle Mass (lbs): 91.4 lbs Total Body Water (lbs): 64.2 lbs Visceral Fat Rating : 4   Other Clinical Data Fasting: no Labs: no Today's Visit #: 42 Starting Date: 01/23/20     ASSESSMENT AND  PLAN:  Diet: Chloee is currently in the action stage of change. As such, her goal is to maintain weight for now. She has agreed to practicing portion control and making smarter food choices, such as increasing vegetables and decreasing simple carbohydrates.  Exercise: Aspynn has been instructed to continue exercising as is for weight loss and overall health benefits.   Behavior Modification:  We discussed the following Behavioral Modification Strategies today: increasing lean protein intake, decreasing simple carbohydrates, increasing vegetables, increase H2O intake, increase high fiber foods, no skipping meals, avoiding temptations, and planning for success. We discussed various medication options to help Jaylen with her weight loss efforts and we both agreed to continue to work on nutritional and behavioral strategies to promote weight loss.  .  Return in about 4 weeks (around 08/10/2023) for Fasting Lab, Fasting IC.Marland Kitchen Advised to come in at 7:10 am for fasting IC testing next visit . She was informed of the importance of frequent follow up visits to maximize her success with intensive lifestyle modifications for her multiple health conditions.  Attestation Statements:   Reviewed by clinician on day of visit: allergies, medications, problem list, medical history, surgical history, family history, social history, and previous encounter notes.   Time spent on visit including pre-visit chart review and post-visit care and charting was 45 minutes.    Alexismarie Flaim, PA-C

## 2023-07-15 ENCOUNTER — Other Ambulatory Visit (HOSPITAL_COMMUNITY): Payer: Self-pay

## 2023-07-17 ENCOUNTER — Other Ambulatory Visit: Payer: Self-pay

## 2023-07-20 ENCOUNTER — Telehealth: Payer: Self-pay | Admitting: Pharmacist

## 2023-07-20 ENCOUNTER — Other Ambulatory Visit: Payer: Self-pay

## 2023-07-20 ENCOUNTER — Other Ambulatory Visit: Payer: Self-pay | Admitting: Pharmacist

## 2023-07-20 ENCOUNTER — Other Ambulatory Visit (HOSPITAL_COMMUNITY): Payer: Self-pay

## 2023-07-20 DIAGNOSIS — F4312 Post-traumatic stress disorder, chronic: Secondary | ICD-10-CM | POA: Diagnosis not present

## 2023-07-20 DIAGNOSIS — F332 Major depressive disorder, recurrent severe without psychotic features: Secondary | ICD-10-CM | POA: Diagnosis not present

## 2023-07-20 DIAGNOSIS — F900 Attention-deficit hyperactivity disorder, predominantly inattentive type: Secondary | ICD-10-CM | POA: Diagnosis not present

## 2023-07-20 DIAGNOSIS — F411 Generalized anxiety disorder: Secondary | ICD-10-CM | POA: Diagnosis not present

## 2023-07-20 MED ORDER — RINVOQ 15 MG PO TB24
ORAL_TABLET | ORAL | 2 refills | Status: DC
  Filled 2023-07-21: qty 30, 30d supply, fill #0
  Filled 2023-08-14: qty 30, 30d supply, fill #1
  Filled 2023-09-15: qty 30, 30d supply, fill #2

## 2023-07-20 MED ORDER — RINVOQ 15 MG PO TB24: ORAL_TABLET | ORAL | 2 refills | Status: DC

## 2023-07-20 NOTE — Telephone Encounter (Signed)
 Opened in error

## 2023-07-20 NOTE — Progress Notes (Signed)
 Specialty Pharmacy Refill Coordination Note  [name] is a 38 y.o. female contacted today regarding refills of specialty medication(s) Upadacitinib (Rinvoq)   Patient requested Delivery   Delivery date: 07/25/23   Verified address: [address], [place], [place] [address]   Medication will be filled on 07/24/23.

## 2023-07-20 NOTE — Progress Notes (Signed)
 Specialty Pharmacy Ongoing Clinical Assessment Note  [name] is a 38 y.o. female who is being followed by the specialty pharmacy service for RxSp Rheumatoid Arthritis   Patient's specialty medication(s) reviewed today: Upadacitinib (Rinvoq)   Missed doses in the last 4 weeks: 0   Patient/Caregiver did not have any additional questions or concerns.   Therapeutic benefit summary: Patient is achieving benefit   Adverse events/side effects summary: No adverse events/side effects   Patient's therapy is appropriate to: Continue    Goals Addressed             This Visit's Progress    Maintain optimal adherence to therapy   On track    Patient is on track. Patient will maintain adherence         Follow up:  6 months  Otto Herb Specialty Pharmacist

## 2023-07-21 ENCOUNTER — Other Ambulatory Visit: Payer: Self-pay

## 2023-07-23 ENCOUNTER — Telehealth: Admitting: Nurse Practitioner

## 2023-07-23 DIAGNOSIS — R399 Unspecified symptoms and signs involving the genitourinary system: Secondary | ICD-10-CM

## 2023-07-23 MED ORDER — CEPHALEXIN 500 MG PO CAPS
500.0000 mg | ORAL_CAPSULE | Freq: Two times a day (BID) | ORAL | 0 refills | Status: AC
Start: 2023-07-23 — End: 2023-07-31
  Filled 2023-07-23 – 2023-07-24 (×2): qty 14, 7d supply, fill #0

## 2023-07-23 NOTE — Progress Notes (Signed)
 I have spent 5 minutes in review of e-visit questionnaire, review and updating patient chart, medical decision making and response to patient.   Claiborne Rigg, NP

## 2023-07-23 NOTE — Progress Notes (Signed)

## 2023-07-24 ENCOUNTER — Other Ambulatory Visit: Payer: Self-pay

## 2023-07-24 ENCOUNTER — Other Ambulatory Visit (HOSPITAL_COMMUNITY): Payer: Self-pay

## 2023-07-24 NOTE — Progress Notes (Signed)
 Pharmacy Patient Advocate Encounter  Received notification from [facility] that Prior Authorization for Rinvoq has been APPROVED from 07/24/23 to 07/23/24   PA #/Case ID/Reference #: [id number]

## 2023-07-24 NOTE — Progress Notes (Signed)
 Pharmacy Patient Advocate Encounter   Received notification from Patient Pharmacy that prior authorization for Rinvoq is required/requested.   Insurance verification completed.   The patient is insured through [facility] .   Per test claim: PA required; PA submitted to above mentioned insurance via CoverMyMeds Key/confirmation #/EOC [id number] Status is pending

## 2023-07-24 NOTE — Progress Notes (Signed)
 PA submitted.

## 2023-07-25 ENCOUNTER — Other Ambulatory Visit: Payer: Self-pay

## 2023-08-10 ENCOUNTER — Other Ambulatory Visit (HOSPITAL_COMMUNITY): Payer: Self-pay

## 2023-08-10 ENCOUNTER — Other Ambulatory Visit: Payer: Self-pay

## 2023-08-10 DIAGNOSIS — F332 Major depressive disorder, recurrent severe without psychotic features: Secondary | ICD-10-CM | POA: Diagnosis not present

## 2023-08-10 DIAGNOSIS — F4310 Post-traumatic stress disorder, unspecified: Secondary | ICD-10-CM | POA: Diagnosis not present

## 2023-08-10 MED ORDER — METHYLPHENIDATE HCL ER (OSM) 54 MG PO TBCR
54.0000 mg | EXTENDED_RELEASE_TABLET | Freq: Every day | ORAL | 0 refills | Status: DC
  Filled 2023-08-10: qty 30, 30d supply, fill #0

## 2023-08-11 DIAGNOSIS — F332 Major depressive disorder, recurrent severe without psychotic features: Secondary | ICD-10-CM | POA: Diagnosis not present

## 2023-08-11 DIAGNOSIS — F4310 Post-traumatic stress disorder, unspecified: Secondary | ICD-10-CM | POA: Diagnosis not present

## 2023-08-11 DIAGNOSIS — F900 Attention-deficit hyperactivity disorder, predominantly inattentive type: Secondary | ICD-10-CM | POA: Diagnosis not present

## 2023-08-11 DIAGNOSIS — F411 Generalized anxiety disorder: Secondary | ICD-10-CM | POA: Diagnosis not present

## 2023-08-13 ENCOUNTER — Other Ambulatory Visit (HOSPITAL_COMMUNITY): Payer: Self-pay

## 2023-08-14 ENCOUNTER — Other Ambulatory Visit (HOSPITAL_COMMUNITY): Payer: Self-pay

## 2023-08-14 ENCOUNTER — Other Ambulatory Visit: Payer: Self-pay

## 2023-08-14 ENCOUNTER — Other Ambulatory Visit: Payer: Self-pay | Admitting: Pharmacy Technician

## 2023-08-14 MED ORDER — REXULTI 1 MG PO TABS
1.0000 mg | ORAL_TABLET | Freq: Every day | ORAL | 2 refills | Status: DC
  Filled 2023-08-14: qty 30, 30d supply, fill #0
  Filled 2023-09-15: qty 30, 30d supply, fill #1
  Filled 2023-10-15: qty 30, 30d supply, fill #2

## 2023-08-14 NOTE — Progress Notes (Signed)
 Specialty Pharmacy Refill Coordination Note  [name] is a 38 y.o. female contacted today regarding refills of specialty medication(s) Upadacitinib  (Rinvoq )   Patient requested (Patient-Rptd) Delivery   Delivery date: (Patient-Rptd) 08/17/23   Verified address: (Patient-Rptd) [address], [place], [place], [address]   Medication will be filled on 08/16/23.

## 2023-08-16 ENCOUNTER — Other Ambulatory Visit: Payer: Self-pay

## 2023-08-21 ENCOUNTER — Encounter (INDEPENDENT_AMBULATORY_CARE_PROVIDER_SITE_OTHER): Payer: Self-pay | Admitting: Family Medicine

## 2023-08-21 ENCOUNTER — Other Ambulatory Visit: Payer: Self-pay

## 2023-08-21 ENCOUNTER — Ambulatory Visit (INDEPENDENT_AMBULATORY_CARE_PROVIDER_SITE_OTHER): Admitting: Family Medicine

## 2023-08-21 VITALS — BP 94/60 | HR 83 | Temp 98.2°F | Ht 60.0 in | Wt 137.0 lb

## 2023-08-21 DIAGNOSIS — Z6833 Body mass index (BMI) 33.0-33.9, adult: Secondary | ICD-10-CM | POA: Diagnosis not present

## 2023-08-21 DIAGNOSIS — E66811 Obesity, class 1: Secondary | ICD-10-CM

## 2023-08-21 DIAGNOSIS — R0602 Shortness of breath: Secondary | ICD-10-CM

## 2023-08-21 DIAGNOSIS — E7849 Other hyperlipidemia: Secondary | ICD-10-CM | POA: Diagnosis not present

## 2023-08-21 DIAGNOSIS — E559 Vitamin D deficiency, unspecified: Secondary | ICD-10-CM | POA: Diagnosis not present

## 2023-08-21 DIAGNOSIS — Z5181 Encounter for therapeutic drug level monitoring: Secondary | ICD-10-CM | POA: Diagnosis not present

## 2023-08-21 DIAGNOSIS — E88819 Insulin resistance, unspecified: Secondary | ICD-10-CM | POA: Diagnosis not present

## 2023-08-21 DIAGNOSIS — F332 Major depressive disorder, recurrent severe without psychotic features: Secondary | ICD-10-CM | POA: Diagnosis not present

## 2023-08-21 DIAGNOSIS — F4310 Post-traumatic stress disorder, unspecified: Secondary | ICD-10-CM | POA: Diagnosis not present

## 2023-08-21 DIAGNOSIS — Z6829 Body mass index (BMI) 29.0-29.9, adult: Secondary | ICD-10-CM

## 2023-08-21 MED ORDER — VITAMIN D (ERGOCALCIFEROL) 1.25 MG (50000 UNIT) PO CAPS
50000.0000 [IU] | ORAL_CAPSULE | ORAL | 0 refills | Status: DC
  Filled 2023-08-21: qty 6, 84d supply, fill #0

## 2023-08-21 NOTE — Progress Notes (Signed)
 Rae Bugler, D.O.  ABFM, ABOM Specializing in Clinical Bariatric Medicine  Office located at: [address] [place], [place]  [address]   Assessment and Plan:   Orders Placed This Encounter  Procedures   VITAMIN D  25 Hydroxy (Vit-D Deficiency, Fractures)   TSH   T4, free   T3   Insulin , random   Hemoglobin A1c   Folate   Vitamin B12   Medications Discontinued During This Encounter  Medication Reason   buPROPion  (WELLBUTRIN  XL) 300 MG 24 hr tablet    Vitamin D , Ergocalciferol , (DRISDOL ) 1.25 MG (50000 UNIT) CAPS capsule Reorder    Meds ordered this encounter  Medications   Vitamin D , Ergocalciferol , (DRISDOL ) 1.25 MG (50000 UNIT) CAPS capsule    Sig: Take 1 capsule (50,000 Units total) by mouth every 14 (fourteen) days.    Dispense:  6 capsule    Refill:  0     FOR THE DISEASE OF OBESITY:  Obesity, current BMI 29.76 Morbid obesity (HCC) - start bmi 33.40 date 01/23/20 Assessment & Plan: Since last office visit on 07/13/2023, patient's muscle mass has increased by 0.6 lbs. Fat mass has increased by 4 lbs. Total body water has decreased by 0.6 lbs.  Counseling done on how various foods will affect these numbers and how to maximize success  Total lbs lost to date: 34 lbs Total weight loss percentage to date: 19.88%    Recommended Dietary Goals Farrell is currently in the action stage of change. As such, her goal is to continue weight management plan.  She has agreed to: switch to Category 2 MP   Behavioral Intervention We discussed the following today: increasing lean protein intake to established goals, going to the skinnytaste website for recipe ideas, and using GPT or another AI platform for recipe ideas- searching "low calorie, low carb, high protein chicken recipes" etc  Additional resources provided today: Handout on balanced plate concepts.  , Handout on risks/ benefits of metformin  and associated Myths of use, and Provided patient with  personalized instruction and demonstration on the use of artificial intelligence for recipes, calorie tracking, and finding healthier options when eating out.   Evidence-based interventions for health behavior change were utilized today including the discussion of self monitoring techniques, problem-solving barriers and SMART goal setting techniques.   Regarding patient's less desirable eating habits and patterns, we employed the technique of small changes.   Pt will specifically work on: n/a   Recommended Physical Activity Goals Keatyn has been advised to work up to 300-450 minutes of moderate intensity aerobic activity a week and strengthening exercises 2-3 times per week for cardiovascular health, weight loss maintenance and preservation of muscle mass.   She has agreed to : Go to the gym two days per week   Pharmacotherapy We both agreed to : {EMagreedrx:29170}   FOR ASSOCIATED CONDITIONS ADDRESSED TODAY:  Insulin  resistance Assessment & Plan: Jenella is on Wegovy  0.5 mg once weekly. She has not taken her Metformin  in months, pt reports she does not feel Wegovy  is as "strong" at Metformin  but she still feels the benefits of medication. Discussed potentially restarting Metformin . Provided pt with handout on risks/benefits of Metformin  at her request. Will hold off on medication at this time. Continue high protein, low carb diet. Will recheck fasting insulin  and A1c today.   Relevant Orders: -     Insulin , random -     Hemoglobin A1c   Vitamin D  deficiency Assessment & Plan: Priscille has not  taken her Vit D supplements in months. She ran out of the supplement and could not get a refill. Will recheck Vit D levels today and refill prescription.   Relevant Orders: -     Vitamin D  (Ergocalciferol ); Take 1 capsule (50,000 Units total) by mouth every 14 (fourteen) days.  Dispense: 6 capsule; Refill: 0 -     VITAMIN D  25 Hydroxy (Vit-D Deficiency, Fractures)   Other  hyperlipidemia Assessment & Plan: On 07/05/2023, Davene received labs with rheumatologist. Lipid panel showed an LDL of 108, VLDL of 13, and HDL extremely elevated at 621. CBC and CMP were WNL. Pt was referred to caridology during LOC d/t these levels, FHx of CAD, and lack of exercise. Engage in heart healthy, low cholesterol MP and regular exercise to improve this condition. Will continue to monitor closely.    Short of breath on exertion Assessment & Plan: Indirect Calorimeter completed today to help guide our dietary regimen. It shows a VO2 of 204 and a REE of 1411.  Her calculated basal metabolic rate is 3086 thus her resting energy expenditure is better than expected. On 02/25/2021, pt weighed 136 lbs and her body fat % was 26.2%, today she is 137 lbs with a body fat % of 29.2%. Charmagne does feel that she gets out of breath more easily that she used to when she exercises. Patient agreed to work on weight loss and gradually increase exercise as tolerated to treat her current condition.   Relevant Orders: -     TSH -     T4, free -     T3 -     Folate -     Vitamin B12  Follow up:   No follow-ups on file.*** She was informed of the importance of frequent follow up visits to maximize her success with intensive lifestyle modifications for her multiple health conditions.  Christina J Seiter is aware that we will review all of her lab results at our next visit together in person.  She is aware that if anything is critical/ life threatening with the results, we will be contacting her via MyChart or by my CMA will be calling them prior to the office visit to discuss acute management.    Subjective:   Chief complaint: Obesity Amariyana is here to discuss her progress with her obesity treatment plan. She is on the the Category 3 Plan and states she is following her eating plan approximately 50% of the time. She states she is not exercising.   Interval History:  Dejha J Crites is here for  a follow up office visit. Since last OV on ,  ***    Been doing well with MP Planning on getting back to exercise Made decision with kids to get up before school to go to the gym  Went on honeymoon recently    Pharmacotherapy for weight loss: She is currently taking Wegovy  0.5 mg once weekly.   Review of Systems:  Pertinent positives were addressed with patient today.  Reviewed by clinician on day of visit: allergies, medications, problem list, medical history, surgical history, family history, social history, and previous encounter notes.  Weight Summary and Biometrics   Weight Lost Since Last Visit: 0lb  Weight Gained Since Last Visit: 5lb    Vitals Temp: 98.2 F (36.8 C) BP: 94/60 Pulse Rate: 83 SpO2: 97 %   Anthropometric Measurements Height: 5' (1.524 m) Weight: 137 lb (62.1 kg) BMI (Calculated): 26.76 Weight at Last Visit: 132lb Weight Lost Since  Last Visit: 0lb Weight Gained Since Last Visit: 5lb Starting Weight: 171lb Total Weight Loss (lbs): 34 lb (15.4 kg)   Body Composition  Body Fat %: 29.2 % Fat Mass (lbs): 40 lbs Muscle Mass (lbs): 92 lbs Total Body Water (lbs): 63.6 lbs Visceral Fat Rating : 4   Other Clinical Data RMR: 1411 Fasting: Yes Labs: Yes Today's Visit #: 43 Starting Date: 01/23/20   Objective:   PHYSICAL EXAM: Blood pressure 94/60, pulse 83, temperature 98.2 F (36.8 C), height 5' (1.524 m), weight 137 lb (62.1 kg), SpO2 97%. Body mass index is 26.76 kg/m.  General: she is overweight, cooperative and in no acute distress. PSYCH: Has normal mood, affect and thought process.   HEENT: EOMI, sclerae are anicteric. Lungs: Normal breathing effort, no conversational dyspnea. Extremities: Moves * 4 Neurologic: A and O * 3, good insight  DIAGNOSTIC DATA REVIEWED: BMET    Component Value Date/Time   NA 140 06/15/2022 0840   K 4.3 06/15/2022 0840   CL 102 06/15/2022 0840   CO2 20 06/15/2022 0840   GLUCOSE 90 06/15/2022  0840   GLUCOSE 94 12/19/2019 0946   BUN 15 06/15/2022 0840   CREATININE 0.80 06/15/2022 0840   CREATININE 0.87 12/19/2019 0946   CALCIUM  9.3 06/15/2022 0840   GFRNONAA >60 11/01/2015 1705   GFRAA >60 11/01/2015 1705   Lab Results  Component Value Date   HGBA1C 5.3 06/15/2022   HGBA1C 5.3 12/19/2019   Lab Results  Component Value Date   INSULIN  8.1 06/15/2022   INSULIN  8.5 01/23/2020   Lab Results  Component Value Date   TSH 0.959 12/01/2021   CBC    Component Value Date/Time   WBC 7.1 12/01/2021 1250   WBC 8.8 12/19/2019 0946   RBC 3.96 12/01/2021 1250   RBC 4.17 12/19/2019 0946   HGB 12.6 12/01/2021 1250   HCT 37.4 12/01/2021 1250   PLT 276 12/01/2021 1250   MCV 94 12/01/2021 1250   MCH 31.8 12/01/2021 1250   MCH 30.7 12/19/2019 0946   MCHC 33.7 12/01/2021 1250   MCHC 33.2 12/19/2019 0946   RDW 12.1 12/01/2021 1250   Iron Studies    Component Value Date/Time   FERRITIN 31.7 10/20/2014 1657   Lipid Panel     Component Value Date/Time   CHOL 258 (H) 06/15/2022 0840   TRIG 103 06/15/2022 0840   HDL 141 06/15/2022 0840   CHOLHDL 2.2 12/01/2021 1250   LDLCALC 100 (H) 06/15/2022 0840   Hepatic Function Panel     Component Value Date/Time   PROT 6.5 06/15/2022 0840   ALBUMIN 4.3 06/15/2022 0840   AST 27 06/15/2022 0840   ALT 21 06/15/2022 0840   ALKPHOS 51 06/15/2022 0840   BILITOT <0.2 06/15/2022 0840   BILIDIR 0.0 12/19/2019 0946   IBILI 0.3 12/19/2019 0946      Component Value Date/Time   TSH 0.959 12/01/2021 1250   Nutritional Lab Results  Component Value Date   VD25OH 70.6 11/17/2022   VD25OH 28.8 (L) 06/15/2022   VD25OH 112.0 (H) 12/01/2021    Attestations:   I, Camryn Mix, acting as a Stage manager for [employer], DO., have compiled all relevant documentation for today's office visit on behalf of Marceil Sensor, DO, while in the presence of [employer], DO.  I have reviewed the above documentation for accuracy and  completeness, and I agree with the above. Rae Bugler, D.O.  The 21st Century Cures Act was signed into law  in 2016 which includes the topic of electronic health records.  This provides immediate access to information in MyChart.  This includes consultation notes, operative notes, office notes, lab results and pathology reports.  If you have any questions about what you read please let us  know at your next visit so we can discuss your concerns and take corrective action if need be.  We are right here with you.

## 2023-08-22 ENCOUNTER — Other Ambulatory Visit (HOSPITAL_BASED_OUTPATIENT_CLINIC_OR_DEPARTMENT_OTHER): Payer: Self-pay

## 2023-08-22 ENCOUNTER — Other Ambulatory Visit: Payer: Self-pay

## 2023-08-22 ENCOUNTER — Encounter (HOSPITAL_BASED_OUTPATIENT_CLINIC_OR_DEPARTMENT_OTHER): Payer: Self-pay | Admitting: Emergency Medicine

## 2023-08-22 ENCOUNTER — Emergency Department (HOSPITAL_BASED_OUTPATIENT_CLINIC_OR_DEPARTMENT_OTHER)
Admission: EM | Admit: 2023-08-22 | Discharge: 2023-08-22 | Disposition: A | Discharge status: Home / Self Care | Attending: Emergency Medicine | Admitting: Emergency Medicine

## 2023-08-22 DIAGNOSIS — Z794 Long term (current) use of insulin: Secondary | ICD-10-CM | POA: Insufficient documentation

## 2023-08-22 DIAGNOSIS — R9431 Abnormal electrocardiogram [ECG] [EKG]: Secondary | ICD-10-CM | POA: Diagnosis not present

## 2023-08-22 DIAGNOSIS — K529 Noninfective gastroenteritis and colitis, unspecified: Secondary | ICD-10-CM | POA: Insufficient documentation

## 2023-08-22 DIAGNOSIS — E876 Hypokalemia: Secondary | ICD-10-CM | POA: Diagnosis not present

## 2023-08-22 DIAGNOSIS — R112 Nausea with vomiting, unspecified: Secondary | ICD-10-CM | POA: Diagnosis present

## 2023-08-22 LAB — COMPREHENSIVE METABOLIC PANEL WITH GFR
ALT: 33 U/L (ref 0–44)
AST: 41 U/L (ref 15–41)
Albumin: 4.2 g/dL (ref 3.5–5.0)
Alkaline Phosphatase: 48 U/L (ref 38–126)
Anion gap: 13 (ref 5–15)
BUN: 11 mg/dL (ref 6–20)
CO2: 22 mmol/L (ref 22–32)
Calcium: 8.7 mg/dL — ABNORMAL LOW (ref 8.9–10.3)
Chloride: 98 mmol/L (ref 98–111)
Creatinine, Ser: 0.91 mg/dL (ref 0.44–1.00)
GFR, Estimated: >60 mL/min (ref >60)
Glucose, Bld: 98 mg/dL (ref 70–99)
Potassium: 2.9 mmol/L — ABNORMAL LOW (ref 3.5–5.1)
Sodium: 133 mmol/L — ABNORMAL LOW (ref 135–145)
Total Bilirubin: 0.2 mg/dL (ref 0.0–1.2)
Total Protein: 6.4 g/dL — ABNORMAL LOW (ref 6.5–8.1)

## 2023-08-22 LAB — BASIC METABOLIC PANEL WITH GFR
Anion gap: 12 (ref 5–15)
BUN: 8 mg/dL (ref 6–20)
CO2: 22 mmol/L (ref 22–32)
Calcium: 7.7 mg/dL — ABNORMAL LOW (ref 8.9–10.3)
Chloride: 102 mmol/L (ref 98–111)
Creatinine, Ser: 0.85 mg/dL (ref 0.44–1.00)
GFR, Estimated: >60 mL/min (ref >60)
Glucose, Bld: 81 mg/dL (ref 70–99)
Potassium: 3.2 mmol/L — ABNORMAL LOW (ref 3.5–5.1)
Sodium: 136 mmol/L (ref 135–145)

## 2023-08-22 LAB — CBC WITH DIFFERENTIAL/PLATELET
Abs Immature Granulocytes: 0.04 10*3/uL (ref 0.00–0.07)
Basophils Absolute: 0 10*3/uL (ref 0.0–0.1)
Basophils Relative: 0 %
Eosinophils Absolute: 0 10*3/uL (ref 0.0–0.5)
Eosinophils Relative: 1 %
HCT: 38.4 % (ref 36.0–46.0)
Hemoglobin: 13.5 g/dL (ref 12.0–15.0)
Immature Granulocytes: 1 %
Lymphocytes Relative: 16 %
Lymphs Abs: 0.9 10*3/uL (ref 0.7–4.0)
MCH: 33.3 pg (ref 26.0–34.0)
MCHC: 35.2 g/dL (ref 30.0–36.0)
MCV: 94.6 fL (ref 80.0–100.0)
Monocytes Absolute: 0.5 10*3/uL (ref 0.1–1.0)
Monocytes Relative: 8 %
Neutro Abs: 4.2 10*3/uL (ref 1.7–7.7)
Neutrophils Relative %: 74 %
Platelets: 280 10*3/uL (ref 150–400)
RBC: 4.06 MIL/uL (ref 3.87–5.11)
RDW: 13 % (ref 11.5–15.5)
WBC: 5.7 10*3/uL (ref 4.0–10.5)
nRBC: 0 % (ref 0.0–0.2)

## 2023-08-22 LAB — FOLATE: Folate: 12.3 ng/mL (ref >3.0)

## 2023-08-22 LAB — VITAMIN D 25 HYDROXY (VIT D DEFICIENCY, FRACTURES): Vit D, 25-Hydroxy: 49.3 ng/mL (ref 30.0–100.0)

## 2023-08-22 LAB — T4, FREE: Free T4: 1.28 ng/dL (ref 0.82–1.77)

## 2023-08-22 LAB — INSULIN, RANDOM: INSULIN: 6.5 u[IU]/mL (ref 2.6–24.9)

## 2023-08-22 LAB — HCG, SERUM, QUALITATIVE: Preg, Serum: NEGATIVE

## 2023-08-22 LAB — HEMOGLOBIN A1C
Est. average glucose Bld gHb Est-mCnc: 94 mg/dL
Hgb A1c MFr Bld: 4.9 % (ref 4.8–5.6)

## 2023-08-22 LAB — T3: T3, Total: 117 ng/dL (ref 71–180)

## 2023-08-22 LAB — VITAMIN B12: Vitamin B-12: 675 pg/mL (ref 232–1245)

## 2023-08-22 LAB — LIPASE, BLOOD: Lipase: 32 U/L (ref 11–51)

## 2023-08-22 LAB — TSH: TSH: 2.73 u[IU]/mL (ref 0.450–4.500)

## 2023-08-22 MED ORDER — SODIUM CHLORIDE 0.9 % IV SOLN
12.5000 mg | Freq: Four times a day (QID) | INTRAVENOUS | Status: DC | PRN
  Administered 2023-08-22: 12.5 mg via INTRAVENOUS
  Filled 2023-08-22: qty 0.5

## 2023-08-22 MED ORDER — PROMETHAZINE HCL 25 MG PO TABS
25.0000 mg | ORAL_TABLET | Freq: Four times a day (QID) | ORAL | 0 refills | Status: DC | PRN
  Filled 2023-08-22: qty 20, 5d supply, fill #0

## 2023-08-22 MED ORDER — PROMETHAZINE HCL 25 MG/ML IJ SOLN
INTRAMUSCULAR | Status: AC
  Filled 2023-08-22: qty 1

## 2023-08-22 MED ORDER — POTASSIUM CHLORIDE 10 MEQ/100ML IV SOLN
10.0000 meq | INTRAVENOUS | Status: AC
  Administered 2023-08-22 (×2): 10 meq via INTRAVENOUS
  Filled 2023-08-22 (×2): qty 100

## 2023-08-22 MED ORDER — SODIUM CHLORIDE 0.9 % IV BOLUS
1000.0000 mL | Freq: Once | INTRAVENOUS | Status: AC
  Administered 2023-08-22: 1000 mL via INTRAVENOUS

## 2023-08-22 MED ORDER — POTASSIUM CHLORIDE CRYS ER 20 MEQ PO TBCR
20.0000 meq | EXTENDED_RELEASE_TABLET | Freq: Every day | ORAL | 0 refills | Status: DC
  Filled 2023-08-22: qty 4, 4d supply, fill #0

## 2023-08-22 MED ORDER — ONDANSETRON HCL 4 MG/2ML IJ SOLN
4.0000 mg | Freq: Once | INTRAMUSCULAR | Status: AC
  Administered 2023-08-22: 4 mg via INTRAVENOUS
  Filled 2023-08-22: qty 2

## 2023-08-22 MED ORDER — SODIUM CHLORIDE 0.9 % IV SOLN: INTRAVENOUS | Status: DC

## 2023-08-22 NOTE — ED Provider Notes (Addendum)
 [place] [facility]ERGENCY DEPARTMENT AT [facility] Provider Note   CSN: [id number] Arrival date & time: 08/22/23  4098     History  Chief Complaint  Patient presents with   Abdominal Pain    [name] is a 38 y.o. female.  Patient with nausea vomiting and diarrhea now for 4 days.  Symptoms started on Saturday night.  Her family members had had the nausea vomiting and diarrhea type illness last week.  Patient's been taking Zofran  just having difficulty controlling the vomiting.  Vomiting and diarrhea has persisted.  No blood in either 1.  No significant abdominal pain.  Patient feels as if she is dehydrated.  Past medical history significant for gastroesophageal reflux disease rheumatoid arthritis.  Patient is a former smoker quit in 2009.  Vital signs here today temp 98.3 pulse 99 respiration 16 blood pressure 127/85 oxygen saturation is 100% on room air.       Home Medications Prior to Admission medications   Medication Sig Start Date End Date Taking? Authorizing Provider  brexpiprazole  (REXULTI ) 1 MG TABS tablet Take 1 tablet (1 mg total) by mouth at bedtime. 08/14/23     busPIRone  (BUSPAR ) 30 MG tablet Take 1 tablet (30 mg total) by mouth 2 (two) times daily. 07/05/23     Dextromethorphan -buPROPion  ER (AUVELITY ) 45-105 MG TBCR Take 1 tablet by mouth 2 (two) times daily. 06/07/23     Dextromethorphan -buPROPion  ER (AUVELITY ) 45-105 MG TBCR Take 1 tablet by mouth 2 (two) times daily. 07/05/23     drospirenone -ethinyl estradiol  (YASMIN ) 3-0.03 MG tablet Take 1 tablet by mouth daily. skip placebos and take continuously 05/17/23     LORazepam  (ATIVAN ) 1 MG tablet Take 1 tablet (1 mg total) by mouth daily as needed for panic attacks 04/05/23     Melatonin 5 MG TABS Take by mouth.    [provider]  metFORMIN  (GLUCOPHAGE ) 500 MG tablet Take 1 tablet by mouth daily with lunch and 2 tablets by mouth daily with dinner 12/05/22   Opalski, Bernardo Bridgeman, DO  methylphenidate   (CONCERTA ) 36 MG PO CR tablet Take 1 tablet (36 mg total) by mouth daily. 06/16/22 06/16/23  Fulton Job, MD  methylphenidate  (CONCERTA ) 36 MG PO CR tablet Take 1 tablet (36 mg total) by mouth daily. 07/05/23     methylphenidate  (CONCERTA ) 54 MG PO CR tablet Take 1 tablet (54 mg total) by mouth daily. 08/10/23     Semaglutide -Weight Management (WEGOVY ) 0.5 MG/0.5ML SOAJ Inject 0.5 mg into the skin once a week. 09/08/22   Marceil Sensor, DO  Upadacitinib  ER (RINVOQ ) 15 MG TB24 1 tablet Orally Once a day 30 days 07/20/23   Jegede, Olugbemiga E, MD  Vitamin D , Ergocalciferol , (DRISDOL ) 1.25 MG (50000 UNIT) CAPS capsule Take 1 capsule (50,000 Units total) by mouth every 14 (fourteen) days. 08/21/23   Marceil Sensor, DO      Allergies    Halothane, Nutmeg oil (myristica oil), and Succinylcholine    Review of Systems   Review of Systems  Constitutional:  Negative for chills and fever.  HENT:  Negative for ear pain and sore throat.   Eyes:  Negative for pain and visual disturbance.  Respiratory:  Negative for cough and shortness of breath.   Cardiovascular:  Negative for chest pain and palpitations.  Gastrointestinal:  Positive for diarrhea, nausea and vomiting. Negative for abdominal pain and blood in stool.  Genitourinary:  Negative for dysuria and hematuria.  Musculoskeletal:  Negative for arthralgias and back pain.  Skin:  Negative for color change and rash.  Neurological:  Negative for seizures and syncope.  All other systems reviewed and are negative.   Physical Exam Updated Vital Signs BP 127/85   Pulse 99   Temp 98.3 F (36.8 C)   Resp 16   Wt 62.1 kg   SpO2 100%   BMI 26.76 kg/m  Physical Exam Vitals and nursing note reviewed.  Constitutional:      General: She is not in acute distress.    Appearance: Normal appearance. She is well-developed.  HENT:     Head: Normocephalic and atraumatic.     Mouth/Throat:     Mouth: Mucous membranes are dry.  Eyes:      Conjunctiva/sclera: Conjunctivae normal.     Pupils: Pupils are equal, round, and reactive to light.  Cardiovascular:     Rate and Rhythm: Normal rate and regular rhythm.     Heart sounds: No murmur heard. Pulmonary:     Effort: Pulmonary effort is normal. No respiratory distress.     Breath sounds: Normal breath sounds.  Abdominal:     General: There is no distension.     Palpations: Abdomen is soft.     Tenderness: There is no abdominal tenderness. There is no guarding.  Musculoskeletal:        General: No swelling.     Cervical back: Neck supple.     Right lower leg: No edema.     Left lower leg: No edema.  Skin:    General: Skin is warm and dry.     Capillary Refill: Capillary refill takes less than 2 seconds.  Neurological:     General: No focal deficit present.     Mental Status: She is alert and oriented to person, place, and time.  Psychiatric:        Mood and Affect: Mood normal.     ED Results / Procedures / Treatments   Labs (all labs ordered are listed, but only abnormal results are displayed) Labs Reviewed  CBC WITH DIFFERENTIAL/PLATELET  COMPREHENSIVE METABOLIC PANEL WITH GFR  LIPASE, BLOOD  HCG, SERUM, QUALITATIVE    EKG None  Radiology No results found.  Procedures Procedures    Medications Ordered in ED Medications  sodium chloride  0.9 % bolus 1,000 mL (has no administration in time range)  0.9 %  sodium chloride  infusion (has no administration in time range)  ondansetron  (ZOFRAN ) injection 4 mg (has no administration in time range)    ED Course/ Medical Decision Making/ A&P                                 Medical Decision Making Amount and/or Complexity of Data Reviewed Labs: ordered.  Risk Prescription drug management.   Patient appears clinically very dehydrated.  Will give IV fluids 1 L and then 100 cc an hour.  Will get electrolytes.  Clinically most likely this is a gastroenteritis since her family members had something  similar.  Patient denies any blood in the bowel movements or the vomit.  Will also give IV Zofran  here  Patient given some IV Phenergan .  Patient feeling little bit better.  After the potassium for the low potassium potassium is up to 3.2.  Electrolytes otherwise normal.  Patient prefers to go home.  Will discharge home little bit of potassium supplementation she has dissolvable Zofran  at home we will go ahead and also give her prescription for Phenergan  to  take at home.  Work note provided.  If vomiting and diarrhea persist she will need to get seen again.   Final Clinical Impression(s) / ED Diagnoses Final diagnoses:  Gastroenteritis    Rx / DC Orders ED Discharge Orders     None         Nicklas Barns, MD 08/22/23 4098    Nicklas Barns, MD 08/22/23 1426

## 2023-08-22 NOTE — ED Triage Notes (Signed)
 Pt c/o abd cramping with n/v/d x 4 days. Recent exposure to norovirus

## 2023-08-22 NOTE — ED Notes (Signed)
 Last zofran  at 0400, 4mg 

## 2023-08-22 NOTE — ED Notes (Signed)
 Discharge paperwork given and verbally understood.

## 2023-08-22 NOTE — Discharge Instructions (Addendum)
 Take the Phenergan  along with the Zofran  dissolvable.  Supplementing the potassium tablets as tolerated.  They can also be crushed and put over applesauce or ice cream.  Make an appointment to follow-up with your primary care doctor.  Return for any new or worse symptoms.  Also can take Imodium A-D for the diarrhea

## 2023-08-23 ENCOUNTER — Other Ambulatory Visit (HOSPITAL_COMMUNITY): Payer: Self-pay

## 2023-08-23 ENCOUNTER — Ambulatory Visit: Admitting: Cardiology

## 2023-08-24 ENCOUNTER — Other Ambulatory Visit: Payer: Self-pay

## 2023-08-28 ENCOUNTER — Other Ambulatory Visit: Payer: Self-pay

## 2023-08-28 DIAGNOSIS — R87619 Unspecified abnormal cytological findings in specimens from cervix uteri: Secondary | ICD-10-CM | POA: Insufficient documentation

## 2023-08-28 DIAGNOSIS — B977 Papillomavirus as the cause of diseases classified elsewhere: Secondary | ICD-10-CM | POA: Insufficient documentation

## 2023-08-28 DIAGNOSIS — K219 Gastro-esophageal reflux disease without esophagitis: Secondary | ICD-10-CM | POA: Insufficient documentation

## 2023-08-28 DIAGNOSIS — Q320 Congenital tracheomalacia: Secondary | ICD-10-CM | POA: Insufficient documentation

## 2023-08-28 DIAGNOSIS — F419 Anxiety disorder, unspecified: Secondary | ICD-10-CM | POA: Insufficient documentation

## 2023-08-28 DIAGNOSIS — Z3490 Encounter for supervision of normal pregnancy, unspecified, unspecified trimester: Secondary | ICD-10-CM | POA: Insufficient documentation

## 2023-08-28 DIAGNOSIS — Q71819 Congenital shortening of unspecified upper limb: Secondary | ICD-10-CM | POA: Insufficient documentation

## 2023-08-30 ENCOUNTER — Encounter: Payer: Self-pay | Admitting: Cardiology

## 2023-08-30 ENCOUNTER — Ambulatory Visit: Attending: Cardiology | Admitting: Cardiology

## 2023-08-30 VITALS — BP 98/72 | HR 74 | Ht 60.0 in | Wt 141.0 lb

## 2023-08-30 DIAGNOSIS — E7849 Other hyperlipidemia: Secondary | ICD-10-CM

## 2023-08-30 DIAGNOSIS — E876 Hypokalemia: Secondary | ICD-10-CM | POA: Diagnosis not present

## 2023-08-30 DIAGNOSIS — R0609 Other forms of dyspnea: Secondary | ICD-10-CM

## 2023-08-30 NOTE — Patient Instructions (Signed)
 Medication Instructions:  Your physician recommends that you continue on your current medications as directed. Please refer to the Current Medication list given to you today.  *If you need a refill on your cardiac medications before your next appointment, please call your pharmacy*  Lab Work: Your physician recommends that you return for lab work in:   Labs today: BMP  If you have labs (blood work) drawn today and your tests are completely normal, you will receive your results only by: MyChart Message (if you have MyChart) OR A paper copy in the mail If you have any lab test that is abnormal or we need to change your treatment, we will call you to review the results.  Testing/Procedures: We will order CT coronary calcium  score. It will cost $99.00 and is due at time of scan.  Please call to schedule.    [facility] [address] [place], [place] [address] [phone]  [facility] [address] [place], [place] [address] [phone]   Your physician has requested that you have a stress echocardiogram. For further information please visit [email or web address] Please follow instruction sheet as given.  Please note: We ask at that you not bring children with you during ultrasound (echo/ vascular) testing. Due to room size and safety concerns, children are not allowed in the ultrasound rooms during exams. Our front office staff cannot provide observation of children in our lobby area while testing is being conducted. An adult accompanying a patient to their appointment will only be allowed in the ultrasound room at the discretion of the ultrasound technician under special circumstances. We apologize for any inconvenience.   Follow-Up: At [facility], you and your health needs are our priority.  As part of our continuing mission to provide you with exceptional heart care, our providers are all part of one team.   This team includes your primary Cardiologist (physician) and Advanced Practice Providers or APPs (Physician Assistants and Nurse Practitioners) who all work together to provide you with the care you need, when you need it.  Your next appointment:   1 year(s)  Provider:   Hillis Lu, MD    We recommend signing up for the patient portal called "MyChart".  Sign up information is provided on this After Visit Summary.  MyChart is used to connect with patients for Virtual Visits (Telemedicine).  Patients are able to view lab/test results, encounter notes, upcoming appointments, etc.  Non-urgent messages can be sent to your provider as well.   To learn more about what you can do with MyChart, go to [email or web address].   Other Instructions None

## 2023-08-30 NOTE — Progress Notes (Signed)
 Cardiology Office Note:    Date:  08/30/2023   ID:  [name], DOB [birth year], MRN [id number]  PCP:  Donley Furth, MD  Cardiologist:  Nelia Balzarine, MD   Referring MD: Arther Larve Montgome*    ASSESSMENT:    1. Other hyperlipidemia   2. DOE (dyspnea on exertion)   3. Hypokalemia    PLAN:    In order of problems listed above:  Primary prevention stressed with the patient.  Importance of compliance with diet medication stressed and patient verbalized standing. Her cholesterol is excellent.  Her HDL is greater than 100.  I told her diet to reduce LDL and get it under 100. Dyspnea on exertion: In view of her concerns we will do an exercise stress echo. Hypokalemia: We will check a potassium.  Long-term this issue will be followed by primary care. For coronary risk stratification I recommended calcium  scoring and she is agreeable. Patient will be seen in follow-up appointment in 12 months or earlier if the patient has any concerns.    Medication Adjustments/Labs and Tests Ordered: Current medicines are reviewed at length with the patient today.  Concerns regarding medicines are outlined above.  Orders Placed This Encounter  Procedures   EKG 12-Lead   No orders of the defined types were placed in this encounter.    History of Present Illness:    [name] is a 38 y.o. female who is being seen today for the evaluation of dyspnea on exertion at the request of Ray[place], Shawn Montgome*.  Patient is a pleasant 38 year old female.  She has no significant past medical history.  She has mild dyslipidemia but very high HDL.  She is sent here for evaluation.  She denies any chest pain orthopnea or PND.  She started exercising recently.  She has some shortness of breath on exertion.  At the time of my evaluation, the patient is alert awake oriented and in no distress.  Past Medical History:  Diagnosis Date   Abdominal pain 12/11/2010   Qualifier: Diagnosis of   By:  Teofilo Fellers MD, Dallie Duel SNOMED Dx Update Oct 2024     Abnormal Pap smear of cervix    ADHD 09/30/2016   Admitted with dehydration 06/03/2014   Anxiety    Anxiety and depression 11/17/2010   Anxiety state 09/04/2010   Qualifier: Diagnosis of   By: Teofilo Fellers MD, Dallie Duel SNOMED Dx Update Oct 2024     At risk for deficient intake of food 03/11/2020   At risk for dehydration 05/06/2020   At risk for diabetes mellitus 02/27/2020   At risk for impaired metabolic function 04/13/2020   At risk for side effect of medication 06/25/2020   BMI 28.0-28.9,adult 09/08/2022   Brachydactyly of fingers    Class 1 obesity with serious comorbidity and body mass index (BMI) of 33.0 to 33.9 in adult 07/13/2020   Constipation, slow transit 11/17/2010   Depression    sees Lenore Rafter PA at [facility]   Dyspnea 02/23/2015   Followed in Pulmonary clinic/ [facility]/ [place]   - 02/23/2015  Walked RA x 3 laps @ 185 ft each stopped due to End of study,  Brisk pace, mild  sob  sats 93%     - D dimer   02/23/15 Pos 0.58 > CTa neg for PE or ILD   03/31/15 >FEV1 105%, ratio 80 and FVC 114%.   - CPST  04/09/15>  There is no evidence of exercise-induced bronchospasm or ventilatory limitations. Low OUES and O2 pulse combi   Esophageal dysphagia 11/17/2010   Esophageal reflux 11/17/2010   Family history of cleft lip 11/14/2011   GAD (generalized anxiety disorder) 03/11/2014   Gastroenteritis 06/02/2014   Generalized anxiety disorder 09/20/2011   GERD (gastroesophageal reflux disease)    History of vitamin D  deficiency 06/15/2022   HPV (human papilloma virus) infection    Insulin  resistance 03/23/2020   Major depressive disorder, recurrent episode, moderate (HCC) 03/11/2014   Mood disorder (HCC),with emotional eating 03/23/2020   NAUSEA AND VOMITING 12/11/2010   Qualifier: Diagnosis of   By: Teofilo Fellers MD, Jeffrey         Other hyperlipidemia 06/03/2020   Polyphagia 10/07/2021   Rheumatoid  arthritis (HCC) 09/13/2013   Right knee pain 09/06/2016   Spondylosis, lumbosacral 11/17/2010   Tachycardia 01/22/2016   Tracheomalacia, congenital    Vitamin D  deficiency 02/27/2020    Past Surgical History:  Procedure Laterality Date   BACK SURGERY     HAND SURGERY  1996   to elongate the right 3rd metacarpal    LUMBAR FUSION  2008   per Dr. Selestino Dakin on L4-5 for spondylolisthesis    NASAL SEPTUM SURGERY  01/01/2018   SHOULDER ARTHROSCOPY      Current Medications: Current Meds  Medication Sig   brexpiprazole  (REXULTI ) 1 MG TABS tablet Take 1 tablet (1 mg total) by mouth at bedtime.   busPIRone  (BUSPAR ) 30 MG tablet Take 1 tablet (30 mg total) by mouth 2 (two) times daily.   Dextromethorphan -buPROPion  ER (AUVELITY ) 45-105 MG TBCR Take 1 tablet by mouth 2 (two) times daily.   Dextromethorphan -buPROPion  ER (AUVELITY ) 45-105 MG TBCR Take 1 tablet by mouth 2 (two) times daily.   drospirenone -ethinyl estradiol  (YASMIN ) 3-0.03 MG tablet Take 1 tablet by mouth daily. skip placebos and take continuously   LORazepam  (ATIVAN ) 1 MG tablet Take 1 tablet (1 mg total) by mouth daily as needed for panic attacks   Melatonin 5 MG TABS Take 5 mg by mouth at bedtime as needed (sleep).   methylphenidate  (CONCERTA ) 54 MG PO CR tablet Take 1 tablet (54 mg total) by mouth daily.   Semaglutide -Weight Management (WEGOVY ) 0.5 MG/0.5ML SOAJ Inject 0.5 mg into the skin once a week.   Upadacitinib  ER (RINVOQ ) 15 MG TB24 1 tablet Orally Once a day 30 days   Vitamin D , Ergocalciferol , (DRISDOL ) 1.25 MG (50000 UNIT) CAPS capsule Take 1 capsule (50,000 Units total) by mouth every 14 (fourteen) days.     Allergies:   Halothane, Nutmeg oil (myristica oil), and Succinylcholine   Social History   Socioeconomic History   Marital status: Divorced    Spouse name: Not on file   Number of children: 1   Years of education: Not on file   Highest education level: Not on file  Occupational History   Occupation: nurse     Employer: [employer]  Tobacco Use   Smoking status: Former    Current packs/day: 0.00    Types: Cigarettes    Quit date: 08/11/2007    Years since quitting: 16.0   Smokeless tobacco: Never  Vaping Use   Vaping status: Some Days  Substance and Sexual Activity   Alcohol use: Not Currently    Alcohol/week: 3.0 standard drinks of alcohol    Types: 3 Glasses of wine per week    Comment: 1 glass of wine a few times a week.   Drug use:  No   Sexual activity: Yes    Partners: Male    Birth control/protection: None    Comment: had intercourse yesterday  Other Topics Concern   Not on file  Social History Narrative   Not on file   Social Drivers of Health   Financial Resource Strain: Low Risk  (06/28/2021)   Received from [facility], [facility]   Overall Financial Resource Strain (CARDIA)    Difficulty of Paying Living Expenses: Not hard at all  Food Insecurity: Not on file  Transportation Needs: Not on file  Physical Activity: Not on file  Stress: No Stress Concern Present (06/28/2021)   Received from [facility], [facility]   [employer] of Occupational Health - Occupational Stress Questionnaire    Feeling of Stress : Not at all  Social Connections: Unknown (08/27/2021)   Received from [employer], [facility]   Social Network    Social Network: Not on file     Family History: The patient's family history includes Anxiety disorder in her father; Arthritis in her brother, father, and mother; Cleft lip in her brother; Depression in her cousin and father; Heart disease in her father; Hyperlipidemia in her father; Hypertension in her father; Mental retardation in her maternal aunt; Obesity in her father; Psoriasis in her brother; Seizures in her cousin and maternal aunt; Suicidality in her cousin; Uterine cancer in her maternal aunt. There is no history of Colon cancer.  ROS:   Please see the history of present illness.    All other systems reviewed and are  negative.  EKGs/Labs/Other Studies Reviewed:    The following studies were reviewed today:  EKG Interpretation Date/Time:  Wednesday Aug 30 2023 07:51:46 EDT Ventricular Rate:  74 PR Interval:  124 QRS Duration:  76 QT Interval:  370 QTC Calculation: 410 R Axis:   85  Text Interpretation: Normal sinus rhythm Normal ECG When compared with ECG of 22-Aug-2023 10:43, PREVIOUS ECG IS PRESENT Confirmed by Hillis Lu [phone]) on 08/30/2023 8:15:18 AM     Recent Labs: 08/21/2023: TSH 2.730 08/22/2023: ALT 33; BUN 8; Creatinine, Ser 0.85; Hemoglobin 13.5; Platelets 280; Potassium 3.2; Sodium 136  Recent Lipid Panel    Component Value Date/Time   CHOL 258 (H) 06/15/2022 0840   TRIG 103 06/15/2022 0840   HDL 141 06/15/2022 0840   CHOLHDL 2.2 12/01/2021 1250   LDLCALC 100 (H) 06/15/2022 0840    Physical Exam:    VS:  BP 98/72   Pulse 74   Ht 5' (1.524 m)   Wt 141 lb (64 kg)   SpO2 97%   BMI 27.54 kg/m     Wt Readings from Last 3 Encounters:  08/30/23 141 lb (64 kg)  08/22/23 137 lb (62.1 kg)  08/21/23 137 lb (62.1 kg)     GEN: Patient is in no acute distress HEENT: Normal NECK: No JVD; No carotid bruits LYMPHATICS: No lymphadenopathy CARDIAC: S1 S2 regular, 2/6 systolic murmur at the apex. RESPIRATORY:  Clear to auscultation without rales, wheezing or rhonchi  ABDOMEN: Soft, non-tender, non-distended MUSCULOSKELETAL:  No edema; No deformity  SKIN: Warm and dry NEUROLOGIC:  Alert and oriented x 3 PSYCHIATRIC:  Normal affect    Signed, Nelia Balzarine, MD  08/30/2023 8:22 AM    [facility]

## 2023-08-31 LAB — BASIC METABOLIC PANEL WITH GFR
BUN/Creatinine Ratio: 11 (ref 9–23)
BUN: 9 mg/dL (ref 6–20)
CO2: 22 mmol/L (ref 20–29)
Calcium: 9.5 mg/dL (ref 8.7–10.2)
Chloride: 100 mmol/L (ref 96–106)
Creatinine, Ser: 0.84 mg/dL (ref 0.57–1.00)
Glucose: 86 mg/dL (ref 70–99)
Potassium: 4.8 mmol/L (ref 3.5–5.2)
Sodium: 137 mmol/L (ref 134–144)
eGFR: 92 mL/min/{1.73_m2} (ref >59)

## 2023-09-04 ENCOUNTER — Ambulatory Visit (HOSPITAL_BASED_OUTPATIENT_CLINIC_OR_DEPARTMENT_OTHER)
Admission: RE | Admit: 2023-09-04 | Discharge: 2023-09-04 | Disposition: A | Discharge status: Home / Self Care | Payer: Self-pay | Source: Ambulatory Visit | Attending: Cardiology | Admitting: Cardiology

## 2023-09-04 DIAGNOSIS — R0609 Other forms of dyspnea: Secondary | ICD-10-CM | POA: Insufficient documentation

## 2023-09-04 DIAGNOSIS — F332 Major depressive disorder, recurrent severe without psychotic features: Secondary | ICD-10-CM | POA: Diagnosis not present

## 2023-09-04 DIAGNOSIS — F411 Generalized anxiety disorder: Secondary | ICD-10-CM | POA: Diagnosis not present

## 2023-09-04 DIAGNOSIS — E7849 Other hyperlipidemia: Secondary | ICD-10-CM | POA: Insufficient documentation

## 2023-09-04 DIAGNOSIS — F4310 Post-traumatic stress disorder, unspecified: Secondary | ICD-10-CM | POA: Diagnosis not present

## 2023-09-04 DIAGNOSIS — F900 Attention-deficit hyperactivity disorder, predominantly inattentive type: Secondary | ICD-10-CM | POA: Diagnosis not present

## 2023-09-04 DIAGNOSIS — E876 Hypokalemia: Secondary | ICD-10-CM | POA: Insufficient documentation

## 2023-09-05 ENCOUNTER — Ambulatory Visit: Payer: Self-pay | Admitting: Cardiology

## 2023-09-07 NOTE — Telephone Encounter (Signed)
-----   Message from Nelia Balzarine sent at 09/05/2023 10:05 AM EDT ----- The results of the study is unremarkable. Please inform patient. I will discuss in detail at next appointment. Cc  primary care/referring physician Nelia Balzarine, MD 09/05/2023 10:05 AM

## 2023-09-07 NOTE — Telephone Encounter (Signed)
**Note De-identified  Obfuscation** Left vm and mychart message

## 2023-09-11 ENCOUNTER — Other Ambulatory Visit: Payer: Self-pay

## 2023-09-13 ENCOUNTER — Other Ambulatory Visit: Payer: Self-pay

## 2023-09-15 ENCOUNTER — Other Ambulatory Visit: Payer: Self-pay

## 2023-09-15 ENCOUNTER — Other Ambulatory Visit (HOSPITAL_COMMUNITY): Payer: Self-pay

## 2023-09-15 NOTE — Progress Notes (Signed)
 Specialty Pharmacy Refill Coordination Note  [name] is a 38 y.o. female contacted today regarding refills of specialty medication(s) Upadacitinib  (Rinvoq )   Patient requested (Patient-Rptd) Delivery   Delivery date: (Patient-Rptd) 09/20/23   Verified address: (Patient-Rptd) [address] [place], [place] [address]   Medication will be filled on 09/19/23.

## 2023-09-20 DIAGNOSIS — F4310 Post-traumatic stress disorder, unspecified: Secondary | ICD-10-CM | POA: Diagnosis not present

## 2023-09-20 DIAGNOSIS — F4312 Post-traumatic stress disorder, chronic: Secondary | ICD-10-CM | POA: Diagnosis not present

## 2023-09-20 DIAGNOSIS — Z5181 Encounter for therapeutic drug level monitoring: Secondary | ICD-10-CM | POA: Diagnosis not present

## 2023-09-20 DIAGNOSIS — F332 Major depressive disorder, recurrent severe without psychotic features: Secondary | ICD-10-CM | POA: Diagnosis not present

## 2023-09-20 DIAGNOSIS — F411 Generalized anxiety disorder: Secondary | ICD-10-CM | POA: Diagnosis not present

## 2023-09-21 ENCOUNTER — Telehealth (HOSPITAL_COMMUNITY): Payer: Self-pay | Admitting: *Deleted

## 2023-09-21 NOTE — Telephone Encounter (Signed)
 Left detailed instructions for stress echo.

## 2023-09-25 ENCOUNTER — Other Ambulatory Visit (HOSPITAL_COMMUNITY): Payer: Self-pay

## 2023-09-28 ENCOUNTER — Ambulatory Visit (INDEPENDENT_AMBULATORY_CARE_PROVIDER_SITE_OTHER): Admitting: Family Medicine

## 2023-09-28 ENCOUNTER — Encounter: Payer: Self-pay | Admitting: Family Medicine

## 2023-09-28 ENCOUNTER — Ambulatory Visit (HOSPITAL_COMMUNITY)
Admission: RE | Admit: 2023-09-28 | Discharge: 2023-09-28 | Disposition: A | Discharge status: Home / Self Care | Source: Ambulatory Visit | Attending: Internal Medicine | Admitting: Internal Medicine

## 2023-09-28 VITALS — BP 118/80 | HR 75 | Temp 98.1°F | Ht 60.0 in | Wt 142.8 lb

## 2023-09-28 DIAGNOSIS — R0609 Other forms of dyspnea: Secondary | ICD-10-CM | POA: Diagnosis not present

## 2023-09-28 DIAGNOSIS — Z0000 Encounter for general adult medical examination without abnormal findings: Secondary | ICD-10-CM | POA: Diagnosis not present

## 2023-09-28 DIAGNOSIS — R413 Other amnesia: Secondary | ICD-10-CM

## 2023-09-28 DIAGNOSIS — R918 Other nonspecific abnormal finding of lung field: Secondary | ICD-10-CM | POA: Diagnosis not present

## 2023-09-28 DIAGNOSIS — E7849 Other hyperlipidemia: Secondary | ICD-10-CM | POA: Diagnosis not present

## 2023-09-28 DIAGNOSIS — E876 Hypokalemia: Secondary | ICD-10-CM | POA: Diagnosis not present

## 2023-09-28 LAB — ECHOCARDIOGRAM STRESS TEST
Area-P 1/2: 4.33 cm2
S' Lateral: 2.8 cm

## 2023-09-28 MED ORDER — PERFLUTREN LIPID MICROSPHERE
1.0000 mL | INTRAVENOUS | Status: AC | PRN
  Administered 2023-09-28: 3 mL via INTRAVENOUS

## 2023-09-28 NOTE — Progress Notes (Signed)
 Subjective:    Patient ID: [name], female    DOB: Oct 16, 1985, 38 y.o.   MRN: [id number]  HPI Here for a well exam. She feels fine physically but she is concerned about her memory. Over the past years she has had more and more trouble with remembering things. She gives a recent example of taking her son to see a movie. Afterwards not only did remember nothing about the movie, she had no memory of even going there. She notes that her father was diagnosed with early onset Alzheimer's disease while in his 22's. She also mentions a recent finding of pulmonary nodules. On 09-04-23 she had a cardiac CT calcium  scoring scan, and her calcium  score was zero. However it showed a RLL 6 mm nodule and a LLL 5 mm nodule that are new since 2016. Follow up scan were recommended for these. She used to smoke cigarettes, but now she vapes. She still [facility] for [facility] and for insulin  resistance. She has had recent labs remarkable for creatinine 0.84, TG 81, LDL 108, A1c 4.9%, normal thyroid  levels, and normal liver enzymes. She sees her GYN every 6 months for Pap smears since they ae following a finding of precancerous cells on her cervix. She sees Amy Hayes Lipps in [place] for depression, anxiety, and ADHD.    Review of Systems  Constitutional: Negative.   HENT: Negative.    Eyes: Negative.   Respiratory: Negative.    Cardiovascular: Negative.   Gastrointestinal: Negative.   Genitourinary:  Negative for decreased urine volume, difficulty urinating, dyspareunia, dysuria, enuresis, flank pain, frequency, hematuria, pelvic pain and urgency.  Musculoskeletal: Negative.   Skin: Negative.   Neurological: Negative.  Negative for headaches.  Psychiatric/Behavioral: Negative.         Objective:   Physical Exam Constitutional:      General: She is not in acute distress.    Appearance: Normal appearance. She is well-developed.  HENT:     Head: Normocephalic and  atraumatic.     Right Ear: External ear normal.     Left Ear: External ear normal.     Nose: Nose normal.     Mouth/Throat:     Pharynx: No oropharyngeal exudate.  Eyes:     General: No scleral icterus.    Conjunctiva/sclera: Conjunctivae normal.     Pupils: Pupils are equal, round, and reactive to light.  Neck:     Thyroid : No thyromegaly.     Vascular: No JVD.  Cardiovascular:     Rate and Rhythm: Normal rate and regular rhythm.     Heart sounds: Normal heart sounds. No murmur heard.    No friction rub. No gallop.  Pulmonary:     Effort: Pulmonary effort is normal. No respiratory distress.     Breath sounds: Normal breath sounds. No wheezing or rales.  Chest:     Chest wall: No tenderness.  Abdominal:     General: Bowel sounds are normal. There is no distension.     Palpations: Abdomen is soft. There is no mass.     Tenderness: There is no abdominal tenderness. There is no guarding or rebound.  Musculoskeletal:        General: No tenderness. Normal range of motion.     Cervical back: Normal range of motion and neck supple.  Lymphadenopathy:     Cervical: No cervical adenopathy.  Skin:    General: Skin is warm and dry.     Findings: No  erythema or rash.  Neurological:     General: No focal deficit present.     Mental Status: She is alert and oriented to person, place, and time.     Cranial Nerves: No cranial nerve deficit.     Motor: No abnormal muscle tone.     Coordination: Coordination normal.     Deep Tendon Reflexes: Reflexes are normal and symmetric. Reflexes normal.  Psychiatric:        Mood and Affect: Mood normal.        Behavior: Behavior normal.        Thought Content: Thought content normal.        Judgment: Judgment normal.           Assessment & Plan:  Well exam. We discussed diet and exercise. We agreed to get a 6 month follow up non-contrasted chest CT in November to follow the pulmonary nodules. I strongly urged her to stop vaping. We will refer  her to Neurology to address her memory issues.  Corita Diego, MD

## 2023-10-04 ENCOUNTER — Other Ambulatory Visit (HOSPITAL_COMMUNITY): Payer: Self-pay

## 2023-10-04 ENCOUNTER — Other Ambulatory Visit: Payer: Self-pay

## 2023-10-04 DIAGNOSIS — F332 Major depressive disorder, recurrent severe without psychotic features: Secondary | ICD-10-CM | POA: Diagnosis not present

## 2023-10-04 DIAGNOSIS — E663 Overweight: Secondary | ICD-10-CM | POA: Diagnosis not present

## 2023-10-04 DIAGNOSIS — F900 Attention-deficit hyperactivity disorder, predominantly inattentive type: Secondary | ICD-10-CM | POA: Diagnosis not present

## 2023-10-04 DIAGNOSIS — M542 Cervicalgia: Secondary | ICD-10-CM | POA: Diagnosis not present

## 2023-10-04 DIAGNOSIS — Z6827 Body mass index (BMI) 27.0-27.9, adult: Secondary | ICD-10-CM | POA: Diagnosis not present

## 2023-10-04 DIAGNOSIS — R911 Solitary pulmonary nodule: Secondary | ICD-10-CM | POA: Diagnosis not present

## 2023-10-04 DIAGNOSIS — M0609 Rheumatoid arthritis without rheumatoid factor, multiple sites: Secondary | ICD-10-CM | POA: Diagnosis not present

## 2023-10-04 DIAGNOSIS — R5382 Chronic fatigue, unspecified: Secondary | ICD-10-CM | POA: Diagnosis not present

## 2023-10-04 DIAGNOSIS — F411 Generalized anxiety disorder: Secondary | ICD-10-CM | POA: Diagnosis not present

## 2023-10-04 DIAGNOSIS — F4312 Post-traumatic stress disorder, chronic: Secondary | ICD-10-CM | POA: Diagnosis not present

## 2023-10-04 MED ORDER — LISDEXAMFETAMINE DIMESYLATE 40 MG PO CAPS
40.0000 mg | ORAL_CAPSULE | Freq: Every morning | ORAL | 0 refills | Status: DC
  Filled 2023-10-04 – 2023-10-11 (×2): qty 30, 30d supply, fill #0

## 2023-10-04 MED ORDER — LORAZEPAM 1 MG PO TABS
1.0000 mg | ORAL_TABLET | Freq: Every day | ORAL | 1 refills | Status: AC
  Filled 2023-10-04 – 2023-12-14 (×3): qty 30, 30d supply, fill #0
  Filled 2024-03-10: qty 30, 30d supply, fill #1

## 2023-10-04 MED ORDER — REXULTI 1 MG PO TABS
1.0000 mg | ORAL_TABLET | Freq: Every day | ORAL | 2 refills | Status: DC
  Filled 2023-10-04 – 2023-11-11 (×2): qty 30, 30d supply, fill #0

## 2023-10-04 MED ORDER — BUSPIRONE HCL 30 MG PO TABS
30.0000 mg | ORAL_TABLET | Freq: Two times a day (BID) | ORAL | 0 refills | Status: DC
  Filled 2023-10-15: qty 180, 90d supply, fill #0

## 2023-10-04 MED ORDER — AUVELITY 45-105 MG PO TBCR
1.0000 | EXTENDED_RELEASE_TABLET | Freq: Two times a day (BID) | ORAL | 0 refills | Status: AC
  Filled 2023-10-04: qty 180, 90d supply, fill #0

## 2023-10-05 ENCOUNTER — Other Ambulatory Visit: Payer: Self-pay

## 2023-10-05 ENCOUNTER — Other Ambulatory Visit (HOSPITAL_COMMUNITY): Payer: Self-pay

## 2023-10-09 ENCOUNTER — Other Ambulatory Visit (HOSPITAL_COMMUNITY): Payer: Self-pay

## 2023-10-09 DIAGNOSIS — F411 Generalized anxiety disorder: Secondary | ICD-10-CM | POA: Diagnosis not present

## 2023-10-09 DIAGNOSIS — F4310 Post-traumatic stress disorder, unspecified: Secondary | ICD-10-CM | POA: Diagnosis not present

## 2023-10-09 DIAGNOSIS — F900 Attention-deficit hyperactivity disorder, predominantly inattentive type: Secondary | ICD-10-CM | POA: Diagnosis not present

## 2023-10-09 DIAGNOSIS — F332 Major depressive disorder, recurrent severe without psychotic features: Secondary | ICD-10-CM | POA: Diagnosis not present

## 2023-10-11 ENCOUNTER — Other Ambulatory Visit (HOSPITAL_COMMUNITY): Payer: Self-pay

## 2023-10-13 ENCOUNTER — Other Ambulatory Visit: Payer: Self-pay

## 2023-10-13 ENCOUNTER — Other Ambulatory Visit: Payer: Self-pay | Admitting: Pharmacist

## 2023-10-13 MED ORDER — RINVOQ 15 MG PO TB24
15.0000 mg | ORAL_TABLET | Freq: Every day | ORAL | 2 refills | Status: DC
  Filled 2023-10-13 – 2023-10-16 (×3): qty 30, 30d supply, fill #0
  Filled 2023-11-09 – 2023-11-10 (×2): qty 30, 30d supply, fill #1
  Filled 2023-12-06: qty 30, 30d supply, fill #2

## 2023-10-13 MED ORDER — RINVOQ 15 MG PO TB24: 15.0000 mg | ORAL_TABLET | Freq: Every day | ORAL | 2 refills | Status: DC

## 2023-10-15 ENCOUNTER — Encounter (INDEPENDENT_AMBULATORY_CARE_PROVIDER_SITE_OTHER): Payer: Self-pay

## 2023-10-16 ENCOUNTER — Other Ambulatory Visit (HOSPITAL_COMMUNITY): Payer: Self-pay

## 2023-10-16 ENCOUNTER — Other Ambulatory Visit: Payer: Self-pay

## 2023-10-16 NOTE — Progress Notes (Signed)
 Specialty Pharmacy Refill Coordination Note  [name] is a 38 y.o. female contacted today regarding refills of specialty medication(s) Upadacitinib  (Rinvoq )   Patient requested Delivery   Delivery date: 10/17/23   Verified address: [address]   Medication will be filled on 06.23.25.

## 2023-10-19 ENCOUNTER — Telehealth: Payer: Self-pay | Admitting: Neurology

## 2023-10-19 NOTE — Telephone Encounter (Signed)
 LVM and sent mychart msg informing pt of need to reschedule 02/08/24 appt - MD out

## 2023-10-21 ENCOUNTER — Ambulatory Visit: Payer: Self-pay | Admitting: Nurse Practitioner

## 2023-10-21 ENCOUNTER — Ambulatory Visit (INDEPENDENT_AMBULATORY_CARE_PROVIDER_SITE_OTHER)

## 2023-10-21 ENCOUNTER — Ambulatory Visit
Admission: EM | Admit: 2023-10-21 | Discharge: 2023-10-21 | Disposition: A | Discharge status: Home / Self Care | Attending: Family Medicine | Admitting: Family Medicine

## 2023-10-21 ENCOUNTER — Ambulatory Visit

## 2023-10-21 ENCOUNTER — Other Ambulatory Visit (HOSPITAL_COMMUNITY): Payer: Self-pay

## 2023-10-21 DIAGNOSIS — Z981 Arthrodesis status: Secondary | ICD-10-CM | POA: Diagnosis not present

## 2023-10-21 DIAGNOSIS — M5441 Lumbago with sciatica, right side: Secondary | ICD-10-CM

## 2023-10-21 DIAGNOSIS — M47816 Spondylosis without myelopathy or radiculopathy, lumbar region: Secondary | ICD-10-CM | POA: Diagnosis not present

## 2023-10-21 DIAGNOSIS — M545 Low back pain, unspecified: Secondary | ICD-10-CM | POA: Diagnosis not present

## 2023-10-21 LAB — POCT URINE PREGNANCY: Preg Test, Ur: NEGATIVE

## 2023-10-21 MED ORDER — KETOROLAC TROMETHAMINE 30 MG/ML IJ SOLN
30.0000 mg | Freq: Once | INTRAMUSCULAR | Status: AC
  Administered 2023-10-21: 30 mg via INTRAMUSCULAR

## 2023-10-21 MED ORDER — CYCLOBENZAPRINE HCL 10 MG PO TABS
10.0000 mg | ORAL_TABLET | Freq: Two times a day (BID) | ORAL | 0 refills | Status: DC | PRN
  Filled 2023-10-21: qty 10, 5d supply, fill #0

## 2023-10-21 NOTE — ED Triage Notes (Signed)
 Pt reports she has low back pain x 1 week. Worsened today. Feels clicking in her back

## 2023-10-21 NOTE — ED Provider Notes (Signed)
 [facility]    CSN: [id number] Arrival date & time: 10/21/23  1334      History   Chief Complaint Chief Complaint  Patient presents with   Back Pain    HPI [name] is a 38 y.o. female presents for low back pain.  Patient reports 1 week of a persistent worsening midline/right-sided low back pain that radiates to her right buttock.  Denies any numbness/tingling/weakness of her lower extremities, no bowel or bladder incontinence, saddle paresthesia.  No known injury or inciting event.  States has history of spinal fusion and hears a clicking sound and is concerned her symptoms are with her hardware.  She has not taken any OTC treatments for symptoms.  No other concerns at this time.   Back Pain   Past Medical History:  Diagnosis Date   Abdominal pain 12/11/2010   Qualifier: Diagnosis of   By: Charlott MD, Reyes SCULL SNOMED Dx Update Oct 2024     Abnormal Pap smear of cervix    ADHD 09/30/2016   Admitted with dehydration 06/03/2014   Anxiety and depression 11/17/2010   she sees Amy Idell Chime in [place]   At risk for deficient intake of food 03/11/2020   At risk for dehydration 05/06/2020   At risk for diabetes mellitus 02/27/2020   At risk for impaired metabolic function 04/13/2020   At risk for side effect of medication 06/25/2020   BMI 28.0-28.9,adult 09/08/2022   Brachydactyly of fingers    Class 1 obesity with serious comorbidity and body mass index (BMI) of 33.0 to 33.9 in adult 07/13/2020   Constipation, slow transit 11/17/2010   Depression    Dyspnea 02/23/2015   Followed in Pulmonary clinic/ [facility]/ [place]   - 02/23/2015  Walked RA x 3 laps @ 185 ft each stopped due to End of study,  Brisk pace, mild  sob  sats 93%     - D dimer   02/23/15 Pos 0.58 > CTa neg for PE or ILD   03/31/15 >FEV1 105%, ratio 80 and FVC 114%.   - CPST  04/09/15>  There is no evidence of exercise-induced bronchospasm or ventilatory limitations. Low  OUES and O2 pulse combi   Esophageal dysphagia 11/17/2010   Esophageal reflux 11/17/2010   Family history of cleft lip 11/14/2011   GAD (generalized anxiety disorder) 03/11/2014   Gastroenteritis 06/02/2014   Generalized anxiety disorder 09/20/2011   GERD (gastroesophageal reflux disease)    History of vitamin D  deficiency 06/15/2022   HPV (human papilloma virus) infection    Insulin  resistance 03/23/2020   Major depressive disorder, recurrent episode, moderate (HCC) 03/11/2014   Mood disorder (HCC),with emotional eating 03/23/2020   NAUSEA AND VOMITING 12/11/2010   Qualifier: Diagnosis of   By: Charlott MD, Jeffrey         Other hyperlipidemia 06/03/2020   Polyphagia 10/07/2021   Rheumatoid arthritis (HCC) 09/13/2013   Right knee pain 09/06/2016   Spondylosis, lumbosacral 11/17/2010   Tachycardia 01/22/2016   Tracheomalacia, congenital    Vitamin D  deficiency 02/27/2020    Patient Active Problem List   Diagnosis Date Noted   Lung nodules 09/28/2023   DOE (dyspnea on exertion) 08/30/2023   Hypokalemia 08/30/2023   Abnormal Pap smear of cervix    Anxiety    Brachydactyly of fingers    GERD (gastroesophageal reflux disease)    HPV (human papilloma virus) infection    Pregnancy    Tracheomalacia, congenital  BMI 28.0-28.9,adult 09/08/2022   History of vitamin D  deficiency 06/15/2022   Polyphagia 10/07/2021   Class 1 obesity with serious comorbidity and body mass index (BMI) of 33.0 to 33.9 in adult 07/13/2020   At risk for side effect of medication 06/25/2020   Other hyperlipidemia 06/03/2020   At risk for dehydration 05/06/2020   At risk for impaired metabolic function 04/13/2020   Mood disorder (HCC),with emotional eating 03/23/2020   Insulin  resistance 03/23/2020   At risk for deficient intake of food 03/11/2020   Vitamin D  deficiency 02/27/2020   At risk for diabetes mellitus 02/27/2020   Depression 02/27/2020   ADHD 09/30/2016   Right knee pain 09/06/2016    Tachycardia 01/22/2016   Dyspnea 02/23/2015   Threatened preterm labor 07/05/2014   Preterm contractions 07/05/2014   Vacuum extraction, delivered, current hospitalization 07/05/2014   Preterm labor 07/03/2014   Admitted with dehydration 06/03/2014   Gastroenteritis 06/02/2014   Major depressive disorder, recurrent episode, moderate (HCC) 03/11/2014   GAD (generalized anxiety disorder) 03/11/2014   Rheumatoid arthritis (HCC) 09/13/2013   Depressive disorder, not elsewhere classified 07/04/2012   Family history of cleft lip 11/14/2011   Generalized anxiety disorder 09/20/2011   NAUSEA AND VOMITING 12/11/2010   Abdominal pain 12/11/2010   Esophageal reflux 11/17/2010   Constipation, slow transit 11/17/2010   Spondylosis, lumbosacral 11/17/2010   Anxiety and depression 11/17/2010   Esophageal dysphagia 11/17/2010   Anxiety state 09/04/2010    Past Surgical History:  Procedure Laterality Date   BACK SURGERY     HAND SURGERY  1996   to elongate the right 3rd metacarpal    LUMBAR FUSION  2008   per Dr. Carles on L4-5 for spondylolisthesis    NASAL SEPTUM SURGERY  01/01/2018   SHOULDER ARTHROSCOPY      OB History     Gravida  2   Para  2   Term  0   Preterm  2   AB  0   Living  2      SAB  0   IAB  0   Ectopic  0   Multiple  0   Live Births  2            Home Medications    Prior to Admission medications   Medication Sig Start Date End Date Taking? Authorizing Provider  brexpiprazole  (REXULTI ) 1 MG TABS tablet Take 1 tablet (1 mg total) by mouth at bedtime. 08/14/23     brexpiprazole  (REXULTI ) 1 MG TABS tablet Take 1 tablet (1 mg total) by mouth at bedtime. 10/04/23     busPIRone  (BUSPAR ) 30 MG tablet Take 1 tablet (30 mg total) by mouth 2 (two) times daily. 10/04/23     cyclobenzaprine  (FLEXERIL ) 10 MG tablet Take 1 tablet (10 mg total) by mouth 2 (two) times daily as needed for muscle spasms. 10/21/23  Yes Shalom Ware, Jodi R, NP   Dextromethorphan -buPROPion  ER (AUVELITY ) 45-105 MG TBCR Take 1 tablet by mouth 2 (two) times daily. 06/07/23     Dextromethorphan -buPROPion  ER (AUVELITY ) 45-105 MG TBCR Take 1 tablet by mouth 2 (two) times daily. 07/05/23     Dextromethorphan -buPROPion  ER (AUVELITY ) 45-105 MG TBCR Take 1 tablet by mouth 2 (two) times daily. 10/04/23     drospirenone -ethinyl estradiol  (YASMIN ) 3-0.03 MG tablet Take 1 tablet by mouth daily. skip placebos and take continuously 05/17/23     lisdexamfetamine (VYVANSE ) 40 MG capsule Take 1 capsule (40 mg total) by mouth in the morning. 10/04/23  LORazepam  (ATIVAN ) 1 MG tablet Take 1 tablet (1 mg total) by mouth daily as needed for panic attacks 04/05/23     LORazepam  (ATIVAN ) 1 MG tablet Take 1 tablet (1 mg total) by mouth daily as needed for panic attacks 10/04/23     Melatonin 5 MG TABS Take 5 mg by mouth at bedtime as needed (sleep).    [provider]  metFORMIN  (GLUCOPHAGE ) 500 MG tablet Take 1 tablet by mouth daily with lunch and 2 tablets by mouth daily with dinner 12/05/22   Opalski, Barnie, DO  methylphenidate  (CONCERTA ) 54 MG PO CR tablet Take 1 tablet (54 mg total) by mouth daily. 08/10/23     promethazine  (PHENERGAN ) 25 MG tablet Take 1 tablet (25 mg total) by mouth every 6 (six) hours as needed for nausea or vomiting. 08/22/23   Zackowski, Scott, MD  Semaglutide -Weight Management (WEGOVY ) 0.5 MG/0.5ML SOAJ Inject 0.5 mg into the skin once a week. 09/08/22   Midge Barnie, DO  Upadacitinib  ER (RINVOQ ) 15 MG TB24 Take 1 tablet (15 mg total) by mouth daily. 10/13/23   Jegede, Olugbemiga E, MD  Vitamin D , Ergocalciferol , (DRISDOL ) 1.25 MG (50000 UNIT) CAPS capsule Take 1 capsule (50,000 Units total) by mouth every 14 (fourteen) days. 08/21/23   Midge Barnie, DO    Family History Family History  Problem Relation Age of Onset   Arthritis Mother    Hyperlipidemia Father    Heart disease Father    Hypertension Father    Arthritis Father    Depression  Father    Anxiety disorder Father    Obesity Father    Arthritis Brother    Psoriasis Brother    Cleft lip Brother        without palate   Uterine cancer Maternal Aunt        x 2   Seizures Maternal Aunt    Mental retardation Maternal Aunt        mild   Seizures Cousin    Depression Cousin        attempted suicide   Suicidality Cousin    Colon cancer Neg Hx     Social History Social History   Tobacco Use   Smoking status: Former    Current packs/day: 0.00    Types: Cigarettes    Quit date: 08/11/2007    Years since quitting: 16.2   Smokeless tobacco: Never  Vaping Use   Vaping status: Some Days  Substance Use Topics   Alcohol use: Not Currently    Alcohol/week: 3.0 standard drinks of alcohol    Types: 3 Glasses of wine per week    Comment: 1 glass of wine a few times a week.   Drug use: No     Allergies   Halothane, Nutmeg oil (myristica oil), and Succinylcholine   Review of Systems Review of Systems  Musculoskeletal:  Positive for back pain.     Physical Exam Triage Vital Signs ED Triage Vitals  Encounter Vitals Group     BP 10/21/23 1408 136/87     Girls Systolic BP Percentile --      Girls Diastolic BP Percentile --      Boys Systolic BP Percentile --      Boys Diastolic BP Percentile --      Pulse Rate 10/21/23 1408 79     Resp 10/21/23 1408 18     Temp 10/21/23 1408 98.4 F (36.9 C)     Temp Source 10/21/23 1408 Oral  SpO2 10/21/23 1408 98 %     Weight --      Height --      Head Circumference --      Peak Flow --      Pain Score 10/21/23 1407 6     Pain Loc --      Pain Education --      Exclude from Growth Chart --    No data found.  Updated Vital Signs BP 136/87 (BP Location: Right Arm)   Pulse 79   Temp 98.4 F (36.9 C) (Oral)   Resp 18   SpO2 98%   Visual Acuity Right Eye Distance:   Left Eye Distance:   Bilateral Distance:    Right Eye Near:   Left Eye Near:    Bilateral Near:     Physical Exam Vitals and  nursing note reviewed.  Constitutional:      General: She is not in acute distress.    Appearance: Normal appearance. She is not ill-appearing.  HENT:     Head: Normocephalic and atraumatic.   Eyes:     Pupils: Pupils are equal, round, and reactive to light.    Cardiovascular:     Rate and Rhythm: Normal rate.  Pulmonary:     Effort: Pulmonary effort is normal.   Musculoskeletal:     Thoracic back: Normal.     Lumbar back: Tenderness and bony tenderness present. No swelling, edema, deformity, signs of trauma, lacerations or spasms. Normal range of motion. Positive right straight leg raise test. Negative left straight leg raise test. No scoliosis.       Back:     Comments: Strength 5 out of 5 bilateral lower extremities   Skin:    General: Skin is warm and dry.   Neurological:     General: No focal deficit present.     Mental Status: She is [name] and oriented to person, place, and time.   Psychiatric:        Mood and Affect: Mood normal.        Behavior: Behavior normal.      UC Treatments / Results  Labs (all labs ordered are listed, but only abnormal results are displayed) Labs Reviewed  POCT URINE PREGNANCY   Basic Metabolic Panel (BMET) Order: [id number]  Status: Final result     Next appt: 11/01/2023 at 09:20 AM in Family Medicine Versa Jenkins, DO)     Dx: Other hyperlipidemia; Hypokalemia; DO...   Test Result Released: Yes (seen)     Messages: Seen   2 Result Notes     1 Patient Communication     View Follow-Up Encounter          Component Ref Range & Units (hover) 1 mo ago (08/30/23) 2 mo ago (08/22/23) 2 mo ago (08/22/23) 3 mo ago (07/05/23) 1 yr ago (06/15/22) 1 yr ago (12/01/21) 3 yr ago (12/19/19)  Glucose 86 81 CM 98 CM  90 83 94 R, CM  BUN 9 8 11  15 12 16  R  Creatinine, Ser 0.84 0.85 R 0.91 R  0.80 0.76 0.87 R  eGFR 92   90.0 R, CM 98 105   BUN/Creatinine Ratio 11    19 16  NOT APPLICABLE R  Sodium 137 136 R 133 Low  R  140 142 140 R   Potassium 4.8 3.2 Low  R 2.9 Low  R  4.3 4.5 3.9 R  Chloride 100 102 R 98 R  102 104 105 R  CO2 22 22 R 22 R  20 20 28  R  Calcium  9.5 7.7 Low  R 8.7 Low  R  9.3 9.2 9.4 R  Resulting Agency [facility]  [facility]         Narrative Performed by: HOYT Performed at:  [address] [facility] [address], [place], [place]  [id number] Lab Director: Frankey Sas MD, Phone:  [phone]  Specimen Collected: 08/30/23 08:37 Last Resulted: 08/31/23 05:36   EKG   Radiology No results found.  Procedures Procedures (including critical care time)  Medications Ordered in UC Medications  ketorolac  (TORADOL ) 30 MG/ML injection 30 mg (30 mg Intramuscular Given 10/21/23 1442)    Initial Impression / Assessment and Plan / UC Course  I have reviewed the triage vital signs and the nursing notes.  Pertinent labs & imaging results that were available during my care of the patient were reviewed by me and considered in my medical decision making (see chart for details).     Reviewed exam and symptoms with patient.  No red flags. Pt given Toradol  injection in clinic.  Patient monitored for 10 minutes after injection with no reaction noted and tolerated well.  Does report improvement in symptoms after injection.  Wet read of x-ray without any obvious abnormalities, will contact.  Follow-up on results based on radiology overread once available.  Trial of Flexeril , side effect profile reviewed.  Discussed rest heat and PCP follow-up in 2 to 3 days for recheck.  ER precautions reviewed. Final Clinical Impressions(s) / UC Diagnoses   Final diagnoses:  Acute midline low back pain with right-sided sciatica     Discharge Instructions      You were given a Toradol  injection in clinic today. Do not take any over the counter NSAID's such as Advil , ibuprofen , Aleve, or naproxen for 24 hours. You may take tylenol  if needed.  You may take Flexeril  twice daily as needed.   Please this medication will make you drowsy.  Do not drink alcohol or drive while on this medication.  Back and lots of rest.  Please follow-up with your PCP in 2 to 3 days for recheck.  Please go to the ER if you develop any worsening symptoms.  Hope you feel better soon!      ED Prescriptions     Medication Sig Dispense Auth. Provider   cyclobenzaprine  (FLEXERIL ) 10 MG tablet Take 1 tablet (10 mg total) by mouth 2 (two) times daily as needed for muscle spasms. 10 tablet Narek Kniss, Jodi R, NP      PDMP not reviewed this encounter.   Loreda Myla SAUNDERS, NP 10/21/23 [phone]

## 2023-10-21 NOTE — Discharge Instructions (Addendum)
 You were given a Toradol  injection in clinic today. Do not take any over the counter NSAID's such as Advil , ibuprofen , Aleve, or naproxen for 24 hours. You may take tylenol  if needed.  You may take Flexeril  twice daily as needed.  Please this medication will make you drowsy.  Do not drink alcohol or drive while on this medication.  Back and lots of rest.  Please follow-up with your PCP in 2 to 3 days for recheck.  Please go to the ER if you develop any worsening symptoms.  Hope you feel better soon!

## 2023-10-23 ENCOUNTER — Other Ambulatory Visit: Payer: Self-pay

## 2023-10-23 ENCOUNTER — Other Ambulatory Visit (HOSPITAL_COMMUNITY): Payer: Self-pay

## 2023-11-01 ENCOUNTER — Encounter (INDEPENDENT_AMBULATORY_CARE_PROVIDER_SITE_OTHER): Payer: Self-pay | Admitting: Family Medicine

## 2023-11-01 ENCOUNTER — Ambulatory Visit (INDEPENDENT_AMBULATORY_CARE_PROVIDER_SITE_OTHER): Admitting: Family Medicine

## 2023-11-01 ENCOUNTER — Other Ambulatory Visit: Payer: Self-pay

## 2023-11-01 ENCOUNTER — Other Ambulatory Visit (HOSPITAL_COMMUNITY): Payer: Self-pay

## 2023-11-01 VITALS — BP 106/70 | HR 84 | Temp 98.1°F | Ht 60.0 in | Wt 140.0 lb

## 2023-11-01 DIAGNOSIS — E7849 Other hyperlipidemia: Secondary | ICD-10-CM | POA: Diagnosis not present

## 2023-11-01 DIAGNOSIS — F900 Attention-deficit hyperactivity disorder, predominantly inattentive type: Secondary | ICD-10-CM | POA: Diagnosis not present

## 2023-11-01 DIAGNOSIS — F4310 Post-traumatic stress disorder, unspecified: Secondary | ICD-10-CM | POA: Diagnosis not present

## 2023-11-01 DIAGNOSIS — E88819 Insulin resistance, unspecified: Secondary | ICD-10-CM

## 2023-11-01 DIAGNOSIS — E66811 Obesity, class 1: Secondary | ICD-10-CM

## 2023-11-01 DIAGNOSIS — Z6827 Body mass index (BMI) 27.0-27.9, adult: Secondary | ICD-10-CM

## 2023-11-01 DIAGNOSIS — E559 Vitamin D deficiency, unspecified: Secondary | ICD-10-CM

## 2023-11-01 DIAGNOSIS — F332 Major depressive disorder, recurrent severe without psychotic features: Secondary | ICD-10-CM | POA: Diagnosis not present

## 2023-11-01 DIAGNOSIS — Z5181 Encounter for therapeutic drug level monitoring: Secondary | ICD-10-CM | POA: Diagnosis not present

## 2023-11-01 DIAGNOSIS — F411 Generalized anxiety disorder: Secondary | ICD-10-CM | POA: Diagnosis not present

## 2023-11-01 DIAGNOSIS — Z1339 Encounter for screening examination for other mental health and behavioral disorders: Secondary | ICD-10-CM | POA: Diagnosis not present

## 2023-11-01 MED ORDER — VITAMIN D (ERGOCALCIFEROL) 1.25 MG (50000 UNIT) PO CAPS
50000.0000 [IU] | ORAL_CAPSULE | ORAL | 0 refills | Status: DC
  Filled 2023-11-01: qty 6, 84d supply, fill #0

## 2023-11-01 MED ORDER — LISDEXAMFETAMINE DIMESYLATE 50 MG PO CAPS
50.0000 mg | ORAL_CAPSULE | Freq: Every morning | ORAL | 0 refills | Status: DC
  Filled 2023-11-01: qty 30, 30d supply, fill #0

## 2023-11-02 ENCOUNTER — Other Ambulatory Visit (HOSPITAL_COMMUNITY): Payer: Self-pay

## 2023-11-09 ENCOUNTER — Other Ambulatory Visit: Payer: Self-pay

## 2023-11-10 ENCOUNTER — Encounter (INDEPENDENT_AMBULATORY_CARE_PROVIDER_SITE_OTHER): Payer: Self-pay

## 2023-11-10 ENCOUNTER — Other Ambulatory Visit (HOSPITAL_COMMUNITY): Payer: Self-pay

## 2023-11-10 ENCOUNTER — Other Ambulatory Visit: Payer: Self-pay

## 2023-11-10 NOTE — Progress Notes (Signed)
 Specialty Pharmacy Refill Coordination Note  MyChart Questionnaire Submission  [name] Alert is a 38 y.o. female contacted today regarding refills of specialty medication(s) Rinvoq .  Doses on hand: (Patient-Rptd) Unsure 5 tablets per [facility].  Patient requested: (Patient-Rptd) Delivery   Delivery date: 11/14/23   Verified address: (Patient-Rptd) [address], [place], [place] [address]  Medication will be filled on 11/13/23.

## 2023-11-11 ENCOUNTER — Other Ambulatory Visit (HOSPITAL_COMMUNITY): Payer: Self-pay

## 2023-11-13 ENCOUNTER — Other Ambulatory Visit: Payer: Self-pay

## 2023-11-22 DIAGNOSIS — Z6828 Body mass index (BMI) 28.0-28.9, adult: Secondary | ICD-10-CM | POA: Diagnosis not present

## 2023-11-22 DIAGNOSIS — M5412 Radiculopathy, cervical region: Secondary | ICD-10-CM | POA: Diagnosis not present

## 2023-11-22 DIAGNOSIS — M4712 Other spondylosis with myelopathy, cervical region: Secondary | ICD-10-CM | POA: Diagnosis not present

## 2023-11-25 NOTE — Progress Notes (Signed)
 Christine DOROTHA Jenkins, D.O.  ABFM, ABOM Specializing in Clinical Bariatric Medicine  Office located at: [address] [place], [place]  [address]   Assessment and Plan:   Medications Discontinued During This Encounter  Medication Reason   promethazine  (PHENERGAN ) 25 MG tablet Patient Preference   Vitamin D , Ergocalciferol , (DRISDOL ) 1.25 MG (50000 UNIT) CAPS capsule Reorder    Meds ordered this encounter  Medications   Vitamin D , Ergocalciferol , (DRISDOL ) 1.25 MG (50000 UNIT) CAPS capsule    Sig: Take 1 capsule (50,000 Units total) by mouth every 14 (fourteen) days.    Dispense:  6 capsule    Refill:  0      FOR THE DISEASE OF OBESITY: Morbid obesity (HCC) - Start bmi 33.40 date 01/23/20 BMI 27.0-27.9,adult - Current 27.34 Assessment & Plan: Since last office visit on 08/21/23 patient's muscle mass has increased by 0.2 lbs. Fat mass has increased by 3.6 lbs. Total body water has increased by 3.6 lbs. Counseling done on how various foods will affect these numbers and how to maximize success  Total lbs lost to date: 31 lbs Total weight loss percentage to date: -18.13%   Recommended Dietary Goals [name] is currently in the action stage of change. As such, her goal is to continue weight management plan.  She has agreed to: {EMWTLOSSPLAN:29297::continue current plan}   Behavioral Intervention We discussed the following today: {dowtlossstrategies:31654}  Additional resources provided today: {DOhandouts:31655::None}  Evidence-based interventions for health behavior change were utilized today including the discussion of self monitoring techniques, problem-solving barriers and SMART goal setting techniques.   Regarding patient's less desirable eating habits and patterns, we employed the technique of small changes.   Pt will specifically work on: ***    Recommended Physical Activity Goals [name] has been advised to work up to 300-450 minutes of moderate intensity  aerobic activity a week and strengthening exercises 2-3 times per week for cardiovascular health, weight loss maintenance and preservation of muscle mass.   She has agreed to: {EMEXERCISE:28847::Think about enjoyable ways to increase daily physical activity and overcoming barriers to exercise,Increase physical activity in their day and reduce sedentary time (increase NEAT).}   Pharmacotherapy Pt is on Wegovy  0.5 mg once weekly. Good compliance and tolerance.   - Continue Wegovy  at current dose.     We both agreed to: {EMagreedrx:29170}   ASSOCIATED CONDITIONS ADDRESSED TODAY:  Vitamin D  deficiency Assessment & Plan: Lab Results  Component Value Date   VD25OH 49.3 08/21/2023   VD25OH 70.6 11/17/2022   VD25OH 28.8 (L) 06/15/2022   On ERGO 50K units. Good compliance and tolerance.    -   ***  Insulin  resistance Lab Results  Component Value Date   HGBA1C 4.9 08/21/2023   HGBA1C 5.3 06/15/2022   HGBA1C 5.0 12/01/2021   INSULIN  6.5 08/21/2023   INSULIN  8.1 06/15/2022   INSULIN  6.7 12/01/2021    Metformin  500 mg with lunch and 1,000 with dinner. Compliant and tolerating well. ***    -   ***  Other hyperlipidemia Assessment & Plan: Lab Results  Component Value Date   CHOL 258 (H) 06/15/2022   HDL 141 06/15/2022   LDLCALC 100 (H) 06/15/2022   TRIG 103 06/15/2022   CHOLHDL 2.2 12/01/2021     -   ***   Follow up:   Return in about 8 weeks (around 12/27/2023) for with Dr MALVA only. She was informed of the importance of frequent follow up visits to maximize her success with intensive lifestyle  modifications for her multiple health conditions.  Subjective:   Chief complaint: Obesity [name] is here to discuss her progress with her obesity treatment plan. She is on the Category 2 Plan and states she is following her eating plan approximately ***% of the time. She states she is *** minutes *** days per week OR  *** not exercising (delete one)   Interval  History:  [name] is here for a follow up office visit. Since last OV on 08/21/23, she is up 3 lbs.      Pharmacotherapy that aid with weight loss: She is currently taking {EMPharmaco:28845}.    Review of Systems:  Pertinent positives were addressed with patient today.  Reviewed by clinician on day of visit: allergies, medications, problem list, medical history, surgical history, family history, social history, and previous encounter notes.  Weight Summary and Biometrics   See Synopsis for biometric data obtained today  Objective:   PHYSICAL EXAM: Blood pressure 106/70, pulse 84, temperature 98.1 F (36.7 C), height 5' (1.524 m), weight 140 lb (63.5 kg), SpO2 99%. Body mass index is 27.34 kg/m.  General: she is overweight, cooperative and in no acute distress. PSYCH: Has normal mood, affect and thought process.   HEENT: EOMI, sclerae are anicteric. Lungs: Normal breathing effort, no conversational dyspnea. Extremities: Moves * 4 Neurologic: A and O * 3, good insight  DIAGNOSTIC DATA REVIEWED: BMET    Component Value Date/Time   NA 137 08/30/2023 0837   K 4.8 08/30/2023 0837   CL 100 08/30/2023 0837   CO2 22 08/30/2023 0837   GLUCOSE 86 08/30/2023 0837   GLUCOSE 81 08/22/2023 1340   BUN 9 08/30/2023 0837   CREATININE 0.84 08/30/2023 0837   CREATININE 0.87 12/19/2019 0946   CALCIUM  9.5 08/30/2023 0837   GFRNONAA >60 08/22/2023 1340   GFRAA >60 11/01/2015 1705   Lab Results  Component Value Date   HGBA1C 4.9 08/21/2023   HGBA1C 5.3 12/19/2019   Lab Results  Component Value Date   INSULIN  6.5 08/21/2023   INSULIN  8.5 01/23/2020   Lab Results  Component Value Date   TSH 2.730 08/21/2023   CBC    Component Value Date/Time   WBC 5.7 08/22/2023 0854   RBC 4.06 08/22/2023 0854   HGB 13.5 08/22/2023 0854   HGB 12.6 12/01/2021 1250   HCT 38.4 08/22/2023 0854   HCT 37.4 12/01/2021 1250   PLT 280 08/22/2023 0854   PLT 276 12/01/2021 1250    MCV 94.6 08/22/2023 0854   MCV 94 12/01/2021 1250   MCH 33.3 08/22/2023 0854   MCHC 35.2 08/22/2023 0854   RDW 13.0 08/22/2023 0854   RDW 12.1 12/01/2021 1250   Iron Studies    Component Value Date/Time   FERRITIN 31.7 10/20/2014 1657   Lipid Panel     Component Value Date/Time   CHOL 258 (H) 06/15/2022 0840   TRIG 103 06/15/2022 0840   HDL 141 06/15/2022 0840   CHOLHDL 2.2 12/01/2021 1250   LDLCALC 100 (H) 06/15/2022 0840   Hepatic Function Panel     Component Value Date/Time   PROT 6.4 (L) 08/22/2023 0854   PROT 6.5 06/15/2022 0840   ALBUMIN 4.2 08/22/2023 0854   ALBUMIN 4.3 06/15/2022 0840   AST 41 08/22/2023 0854   ALT 33 08/22/2023 0854   ALKPHOS 48 08/22/2023 0854   BILITOT 0.2 08/22/2023 0854   BILITOT <0.2 06/15/2022 0840   BILIDIR 0.0 12/19/2019 0946   IBILI 0.3 12/19/2019 0946  Component Value Date/Time   TSH 2.730 08/21/2023 0822   Nutritional Lab Results  Component Value Date   VD25OH 49.3 08/21/2023   VD25OH 70.6 11/17/2022   VD25OH 28.8 (L) 06/15/2022    Attestations:   LILLETTE Vernell Forest, acting as a medical scribe for Christine Jenkins, DO., have compiled all relevant documentation for today's office visit on behalf of Christine Jenkins, DO, while in the presence of [employer], DO.  Reviewed by clinician on day of visit: allergies, medications, problem list, medical history, surgical history, family history, social history, and previous encounter notes pertinent to patient's obesity diagnosis.  I have spent *** minutes in the care of the patient today including *** minutes face-to-face assessing and reviewing listed medical problems above as outlined in office visit note and providing nutritional and behavioral counseling as outlined in obesity care plan.   I have reviewed the above documentation for accuracy and completeness, and I agree with the above. Christine JINNY Jenkins, D.O.  The 21st Century Cures Act was signed into law in 2016 which  includes the topic of electronic health records.  This provides immediate access to information in MyChart.  This includes consultation notes, operative notes, office notes, lab results and pathology reports.  If you have any questions about what you read please let us  know at your next visit so we can discuss your concerns and take corrective action if need be.  We are right here with you.

## 2023-11-30 ENCOUNTER — Other Ambulatory Visit: Payer: Self-pay

## 2023-11-30 ENCOUNTER — Ambulatory Visit: Attending: Neurosurgery

## 2023-11-30 DIAGNOSIS — F4310 Post-traumatic stress disorder, unspecified: Secondary | ICD-10-CM | POA: Diagnosis not present

## 2023-11-30 DIAGNOSIS — R293 Abnormal posture: Secondary | ICD-10-CM | POA: Diagnosis not present

## 2023-11-30 DIAGNOSIS — M542 Cervicalgia: Secondary | ICD-10-CM | POA: Insufficient documentation

## 2023-11-30 DIAGNOSIS — F332 Major depressive disorder, recurrent severe without psychotic features: Secondary | ICD-10-CM | POA: Diagnosis not present

## 2023-11-30 NOTE — Therapy (Signed)
 OUTPATIENT PHYSICAL THERAPY CERVICAL EVALUATION   Patient Name: [name] MRN: [id number] DOB:22-Aug-1985, 38 y.o., female Today's Date: 11/30/2023  END OF SESSION:   Visit Number 1 Number of Visits 12 Date for PT re-eval 01/11/2024  Authorization Type [facility] [employer] Authorization Time Period Authorization Visit Number Progress Note due on visit #   PT start time 1743 PT stop time 1821 PT time calculation (min) 38 min    Past Medical History:  Diagnosis Date   Abdominal pain 12/11/2010   Qualifier: Diagnosis of   By: Charlott MD, Reyes SCULL SNOMED Dx Update Oct 2024     Abnormal Pap smear of cervix    ADHD 09/30/2016   Admitted with dehydration 06/03/2014   Anxiety and depression 11/17/2010   she sees Amy Idell Chime in [place]   At risk for deficient intake of food 03/11/2020   At risk for dehydration 05/06/2020   At risk for diabetes mellitus 02/27/2020   At risk for impaired metabolic function 04/13/2020   At risk for side effect of medication 06/25/2020   BMI 28.0-28.9,adult 09/08/2022   Brachydactyly of fingers    Class 1 obesity with serious comorbidity and body mass index (BMI) of 33.0 to 33.9 in adult 07/13/2020   Constipation, slow transit 11/17/2010   Depression    Dyspnea 02/23/2015   Followed in Pulmonary clinic/ [facility]/ [place]   - 02/23/2015  Walked RA x 3 laps @ 185 ft each stopped due to End of study,  Brisk pace, mild  sob  sats 93%     - D dimer   02/23/15 Pos 0.58 > CTa neg for PE or ILD   03/31/15 >FEV1 105%, ratio 80 and FVC 114%.   - CPST  04/09/15>  There is no evidence of exercise-induced bronchospasm or ventilatory limitations. Low OUES and O2 pulse combi   Esophageal dysphagia 11/17/2010   Esophageal reflux 11/17/2010   Family history of cleft lip 11/14/2011   GAD (generalized anxiety disorder) 03/11/2014   Gastroenteritis 06/02/2014   Generalized anxiety disorder 09/20/2011   GERD (gastroesophageal reflux disease)     History of vitamin D  deficiency 06/15/2022   HPV (human papilloma virus) infection    Insulin  resistance 03/23/2020   Major depressive disorder, recurrent episode, moderate (HCC) 03/11/2014   Mood disorder (HCC),with emotional eating 03/23/2020   NAUSEA AND VOMITING 12/11/2010   Qualifier: Diagnosis of   By: Charlott MD, Jeffrey         Other hyperlipidemia 06/03/2020   Polyphagia 10/07/2021   Rheumatoid arthritis (HCC) 09/13/2013   Right knee pain 09/06/2016   Spondylosis, lumbosacral 11/17/2010   Tachycardia 01/22/2016   Tracheomalacia, congenital    Vitamin D  deficiency 02/27/2020   Past Surgical History:  Procedure Laterality Date   BACK SURGERY     HAND SURGERY  1996   to elongate the right 3rd metacarpal    LUMBAR FUSION  2008   per Dr. Carles on L4-5 for spondylolisthesis    NASAL SEPTUM SURGERY  01/01/2018   SHOULDER ARTHROSCOPY     Patient Active Problem List   Diagnosis Date Noted   Lung nodules 09/28/2023   DOE (dyspnea on exertion) 08/30/2023   Hypokalemia 08/30/2023   Abnormal Pap smear of cervix    Anxiety    Brachydactyly of fingers    GERD (gastroesophageal reflux disease)    HPV (human papilloma virus) infection    Pregnancy    Tracheomalacia, congenital    BMI 28.0-28.9,adult  09/08/2022   History of vitamin D  deficiency 06/15/2022   Polyphagia 10/07/2021   Class 1 obesity with serious comorbidity and body mass index (BMI) of 33.0 to 33.9 in adult 07/13/2020   At risk for side effect of medication 06/25/2020   Other hyperlipidemia 06/03/2020   At risk for dehydration 05/06/2020   At risk for impaired metabolic function 04/13/2020   Mood disorder (HCC),with emotional eating 03/23/2020   Insulin  resistance 03/23/2020   At risk for deficient intake of food 03/11/2020   Vitamin D  deficiency 02/27/2020   At risk for diabetes mellitus 02/27/2020   Depression 02/27/2020   ADHD 09/30/2016   Right knee pain 09/06/2016   Tachycardia 01/22/2016    Dyspnea 02/23/2015   Threatened preterm labor 07/05/2014   Preterm contractions 07/05/2014   Vacuum extraction, delivered, current hospitalization 07/05/2014   Preterm labor 07/03/2014   Admitted with dehydration 06/03/2014   Gastroenteritis 06/02/2014   Major depressive disorder, recurrent episode, moderate (HCC) 03/11/2014   GAD (generalized anxiety disorder) 03/11/2014   Rheumatoid arthritis (HCC) 09/13/2013   Depressive disorder, not elsewhere classified 07/04/2012   Family history of cleft lip 11/14/2011   Generalized anxiety disorder 09/20/2011   NAUSEA AND VOMITING 12/11/2010   Abdominal pain 12/11/2010   Esophageal reflux 11/17/2010   Constipation, slow transit 11/17/2010   Spondylosis, lumbosacral 11/17/2010   Anxiety and depression 11/17/2010   Esophageal dysphagia 11/17/2010   Anxiety state 09/04/2010    PCP: Johnny Garnette LABOR, MD  REFERRING PROVIDER: Debby Dorn MATSU, MD  REFERRING DIAG: Cervical radiculopathy [M54.12]   THERAPY DIAG:  Cervicalgia  Abnormal posture  Rationale for Evaluation and Treatment: Rehabilitation  ONSET DATE: 11/22/2023 date of referral   SUBJECTIVE:                                                                                                                                                                                                         SUBJECTIVE STATEMENT: Patient reports to PT with neck pain that has been present for months and is worsening. She states that it feels painful and heavy much of the day. Referring provider noted significant hyperreflexia and L hoffmann w/o myelopathy symptoms. She has been scheduled for cervical MRI.   Hand dominance: Right  PERTINENT HISTORY:  Relevant PMHx includes ADHD, Anxiety, Depression, Dyspnea, Esophageal dysphagia, Esophageal reflux, RA, Tachycardia, L4-L5 fusion (2008) for spondylolistehesis   PAIN:  Are you having pain? Yes: NPRS scale: 4-5/10 current, 7/10 at worst  Pain  location: mid cervical spine  Pain description: aching, burning, heavy, headache  Aggravating  factors: turning head  Relieving factors: laying down (temporarily)    PRECAUTIONS: None  RED FLAGS: None     WEIGHT BEARING RESTRICTIONS: No  FALLS:  Has patient fallen in last 6 months? No  LIVING ENVIRONMENT: Lives with: lives with their family and lives with their spouse Stairs: Yes with railing present   OCCUPATION: Nurse   PLOF: Independent  PATIENT GOALS: to have less pain in neck with daily activities   NEXT MD VISIT: 01/03/2024  OBJECTIVE:  Note: Objective measures were completed at Evaluation unless otherwise noted.   PATIENT SURVEYS:  NDI:  NECK DISABILITY INDEX  Date: 11/30/2023 Score  Pain intensity 2 = The pain is moderate at the moment  2. Personal care (washing, dressing, etc.) 0 = I can look after myself normally without causing extra pain  3. Lifting 0 =  I can lift heavy weights without extra pain  4. Reading 2 =  I can read as much as I want with moderate pain in my neck  5. Headaches 1 =  I have slight headaches, which come infrequently  6. Concentration 0 =  I can concentrate fully when I want to with no difficulty  7. Work 0 =  I can do as much work as I want to  8. Driving 1 =  I can drive my car as long as I want with slight pain in my neck  9. Sleeping 1 = My sleep is slightly disturbed (less than 1 hr sleepless)  10. Recreation 1 =  I am able to engage in all my recreation activities, with some pain in my neck  Total 8/50   Minimum Detectable Change (90% confidence): 5 points or 10% points  COGNITION: Overall cognitive status: Within functional limits for tasks assessed  SENSATION: Not tested  POSTURE: forward head   Cervical MMT: 4/5 MMT in all directions   Pain with all directions, in same place as primary symptoms   CERVICAL ROM:   Active ROM A/PROM (deg) eval  Flexion 45  Extension 40  Right lateral flexion 30  Left lateral  flexion 40  Right rotation 64  Left rotation 45   (Blank rows = not tested)  UPPER EXTREMITY ROM: WFL BIL   Active ROM Right eval Left eval  Shoulder flexion 4 4-  Shoulder extension    Shoulder abduction 4+ 4  Shoulder adduction    Shoulder extension    Shoulder internal rotation 4+ 4+  Shoulder external rotation 4 4  Elbow flexion 4+ 4+  Elbow extension 5 5  Wrist flexion    Wrist extension    Wrist ulnar deviation    Wrist radial deviation    Wrist pronation    Wrist supination     (Blank rows = not tested)  UPPER EXTREMITY MMT:  MMT Right eval Left eval  Shoulder flexion    Shoulder extension    Shoulder abduction    Shoulder adduction    Shoulder extension    Shoulder internal rotation    Shoulder external rotation    Middle trapezius    Lower trapezius    Elbow flexion    Elbow extension    Wrist flexion    Wrist extension    Wrist ulnar deviation    Wrist radial deviation    Wrist pronation    Wrist supination    Grip strength     (Blank rows = not tested)  CERVICAL SPECIAL TESTS:  Cranial cervical flexion test: Positive and Neck  flexor muscle endurance test: Positive     TREATMENT DATE:   [facility] Adult PT Treatment:                                                DATE: 11/30/2023   Initial evaluation: see patient education and home exercise program as noted below                                                                                                                                   PATIENT EDUCATION:  Education details: reviewed initial home exercise program; discussion of POC, prognosis and goals for skilled PT   Person educated: Patient Education method: Explanation, Demonstration, and Handouts Education comprehension: verbalized understanding, returned demonstration, and needs further education  HOME EXERCISE PROGRAM: Access Code: [email or web address] URL: [email or web address] Date: 12/06/2023 Prepared by: Marko Molt  Exercises - Supine Cervical Retraction with Towel  - 2 x daily - 7 x weekly - 2 sets - 10 reps - 3 sec hold - Seated Scapular Retraction  - 2 x daily - 7 x weekly - 2 sets - 10 reps - 3 sec hold - Prone W Scapular Retraction  - 2 x daily - 7 x weekly - 2 sets - 10 reps  ASSESSMENT:  CLINICAL IMPRESSION: [name] is a 38 y.o. female who was seen today for physical therapy evaluation and treatment for Neck pain with mobility deficits. She is demonstrating decreased CS MMT, decreased UE MMT, and decreased CS and postural mm endurance. She has related pain and difficulty with turning her head, holding her head up throughout the day, and holding head down for reading tasks. She requires skilled PT services at this time to address relevant deficits and improve overall function.     OBJECTIVE IMPAIRMENTS: decreased activity tolerance, decreased endurance, decreased mobility, decreased ROM, decreased strength, impaired UE functional use, postural dysfunction, and pain.   ACTIVITY LIMITATIONS: carrying, lifting, reach over head, hygiene/grooming, and caring for others  PARTICIPATION LIMITATIONS: meal prep, cleaning, laundry, interpersonal relationship, driving, community activity, and occupation  PERSONAL FACTORS: Time since onset of injury/illness/exacerbation and 3+ comorbidities: Relevant PMHx includes ADHD, Anxiety, Depression, Dyspnea, Esophageal dysphagia, Esophageal reflux, RA, Tachycardia, L4-L5 fusion (2008) for spondylolistehesis are also affecting patient's functional outcome.   REHAB POTENTIAL: Fair    CLINICAL DECISION MAKING: Evolving/moderate complexity  EVALUATION COMPLEXITY: Moderate   GOALS: Goals reviewed with patient? YES  SHORT TERM GOALS: Target date: 12/21/2023   Patient will be independent with initial home program at least 3 days/week.  Baseline: provided at eval Goal Status: INITIAL   2.  Patient will demonstrate improved postural awareness for at least  15 minutes while seated without need for cueing from PT.  Baseline: see objective measures Goal Status: INITIAL   3.  Patient will demonstrate  improved active R lateral flexion and Left rotation by at least 5 degrees.  Baseline: see objective measures Goal status: INITIAL   LONG TERM GOALS: Target date: 01/11/2024   Patient will report improved overall functional ability with NDI score of 3/50 or less.  Baseline: 8/50 Goal Status: INITIAL    2.  Patient will demonstrate improved UQ strength to at least 4+/5 MMT BIL.  Baseline: see objective measures.  Goal status: INITIAL  3.  Patient will be able to maintain cervical flexion endurance test for at least 25 seconds Baseline: <15 seconds Goal status: INITIAL  4.  Patient will report minimal to no pain with turning her head while driving, nor with work related activities Baseline: moderate pain and difficulty/reported heaviness.  Goal status: INITIAL     PLAN:  PT FREQUENCY: 1-2x/week  PT DURATION: 6 weeks  PLANNED INTERVENTIONS: 02835- PT Re-evaluation, 97750- Physical Performance Testing, 97110-Therapeutic exercises, 97530- Therapeutic activity, [id number]- Neuromuscular re-education, 97535- Self Care, 02859- Manual therapy, [id number]- Electrical stimulation (unattended), 02987- Traction (mechanical), 20560 (1-2 muscles), 20561 (3+ muscles)- Dry Needling, Patient/Family education, Taping, Joint mobilization, Spinal mobilization, Cryotherapy, and Moist heat  PLAN FOR NEXT SESSION: begin CS mobility and strengthening program as indicated. Address CS and periscapular mm endurance.    Marko Molt, PT, DPT  12/06/2023 9:06 AM

## 2023-12-06 ENCOUNTER — Ambulatory Visit

## 2023-12-06 ENCOUNTER — Encounter (INDEPENDENT_AMBULATORY_CARE_PROVIDER_SITE_OTHER): Payer: Self-pay

## 2023-12-06 ENCOUNTER — Other Ambulatory Visit (HOSPITAL_COMMUNITY): Payer: Self-pay

## 2023-12-06 ENCOUNTER — Other Ambulatory Visit: Payer: Self-pay

## 2023-12-06 DIAGNOSIS — R293 Abnormal posture: Secondary | ICD-10-CM | POA: Diagnosis not present

## 2023-12-06 DIAGNOSIS — M542 Cervicalgia: Secondary | ICD-10-CM | POA: Diagnosis not present

## 2023-12-06 DIAGNOSIS — M4712 Other spondylosis with myelopathy, cervical region: Secondary | ICD-10-CM | POA: Diagnosis not present

## 2023-12-06 DIAGNOSIS — M5021 Other cervical disc displacement,  high cervical region: Secondary | ICD-10-CM | POA: Diagnosis not present

## 2023-12-06 DIAGNOSIS — M4802 Spinal stenosis, cervical region: Secondary | ICD-10-CM | POA: Diagnosis not present

## 2023-12-06 DIAGNOSIS — M50222 Other cervical disc displacement at C5-C6 level: Secondary | ICD-10-CM | POA: Diagnosis not present

## 2023-12-06 NOTE — Progress Notes (Signed)
 Specialty Pharmacy Refill Coordination Note  MyChart Questionnaire Submission  [name] Alert is a 38 y.o. female contacted today regarding refills of specialty medication(s) Rinvoq .  Doses on hand: (Patient-Rptd) Not sure - 9 tablets per [facility]  Patient requested: (Patient-Rptd) Delivery   Delivery date: 12/12/23  Verified address: [address] [place] [address]  Medication will be filled on 12/11/23.

## 2023-12-06 NOTE — Therapy (Signed)
 OUTPATIENT PHYSICAL THERAPY TREATMENT NOTE   Patient Name: [name] MRN: [id number] DOB:1986-01-11, 38 y.o., female Today's Date: 12/06/2023  END OF SESSION:  PT End of Session - 12/06/23 1043     Visit Number 2    Number of Visits 12    Date for PT Re-Evaluation 01/11/24    Authorization Type [facility] [employer]    PT Start Time 1045    PT Stop Time 1125    PT Time Calculation (min) 40 min    Activity Tolerance Patient tolerated treatment well    Behavior During Therapy [facility] for tasks assessed/performed          Visit Number 1 Number of Visits 12 Date for PT re-eval 01/11/2024  Authorization Type [facility] [employer] Authorization Time Period Authorization Visit Number Progress Note due on visit #   PT start time 1743 PT stop time 1821 PT time calculation (min) 38 min    Past Medical History:  Diagnosis Date   Abdominal pain 12/11/2010   Qualifier: Diagnosis of   By: Charlott MD, Reyes SCULL SNOMED Dx Update Oct 2024     Abnormal Pap smear of cervix    ADHD 09/30/2016   Admitted with dehydration 06/03/2014   Anxiety and depression 11/17/2010   she sees Amy Idell Chime in [place]   At risk for deficient intake of food 03/11/2020   At risk for dehydration 05/06/2020   At risk for diabetes mellitus 02/27/2020   At risk for impaired metabolic function 04/13/2020   At risk for side effect of medication 06/25/2020   BMI 28.0-28.9,adult 09/08/2022   Brachydactyly of fingers    Class 1 obesity with serious comorbidity and body mass index (BMI) of 33.0 to 33.9 in adult 07/13/2020   Constipation, slow transit 11/17/2010   Depression    Dyspnea 02/23/2015   Followed in Pulmonary clinic/ [facility]/ [place]   - 02/23/2015  Walked RA x 3 laps @ 185 ft each stopped due to End of study,  Brisk pace, mild  sob  sats 93%     - D dimer   02/23/15 Pos 0.58 > CTa neg for PE or ILD   03/31/15 >FEV1 105%, ratio 80 and FVC 114%.   - CPST  04/09/15>  There is no evidence of  exercise-induced bronchospasm or ventilatory limitations. Low OUES and O2 pulse combi   Esophageal dysphagia 11/17/2010   Esophageal reflux 11/17/2010   Family history of cleft lip 11/14/2011   GAD (generalized anxiety disorder) 03/11/2014   Gastroenteritis 06/02/2014   Generalized anxiety disorder 09/20/2011   GERD (gastroesophageal reflux disease)    History of vitamin D  deficiency 06/15/2022   HPV (human papilloma virus) infection    Insulin  resistance 03/23/2020   Major depressive disorder, recurrent episode, moderate (HCC) 03/11/2014   Mood disorder (HCC),with emotional eating 03/23/2020   NAUSEA AND VOMITING 12/11/2010   Qualifier: Diagnosis of   By: Charlott MD, Laquan Beier         Other hyperlipidemia 06/03/2020   Polyphagia 10/07/2021   Rheumatoid arthritis (HCC) 09/13/2013   Right knee pain 09/06/2016   Spondylosis, lumbosacral 11/17/2010   Tachycardia 01/22/2016   Tracheomalacia, congenital    Vitamin D  deficiency 02/27/2020   Past Surgical History:  Procedure Laterality Date   BACK SURGERY     HAND SURGERY  1996   to elongate the right 3rd metacarpal    LUMBAR FUSION  2008   per Dr. Carles on L4-5 for spondylolisthesis  NASAL SEPTUM SURGERY  01/01/2018   SHOULDER ARTHROSCOPY     Patient Active Problem List   Diagnosis Date Noted   Lung nodules 09/28/2023   DOE (dyspnea on exertion) 08/30/2023   Hypokalemia 08/30/2023   Abnormal Pap smear of cervix    Anxiety    Brachydactyly of fingers    GERD (gastroesophageal reflux disease)    HPV (human papilloma virus) infection    Pregnancy    Tracheomalacia, congenital    BMI 28.0-28.9,adult 09/08/2022   History of vitamin D  deficiency 06/15/2022   Polyphagia 10/07/2021   Class 1 obesity with serious comorbidity and body mass index (BMI) of 33.0 to 33.9 in adult 07/13/2020   At risk for side effect of medication 06/25/2020   Other hyperlipidemia 06/03/2020   At risk for dehydration 05/06/2020   At risk for  impaired metabolic function 04/13/2020   Mood disorder (HCC),with emotional eating 03/23/2020   Insulin  resistance 03/23/2020   At risk for deficient intake of food 03/11/2020   Vitamin D  deficiency 02/27/2020   At risk for diabetes mellitus 02/27/2020   Depression 02/27/2020   ADHD 09/30/2016   Right knee pain 09/06/2016   Tachycardia 01/22/2016   Dyspnea 02/23/2015   Threatened preterm labor 07/05/2014   Preterm contractions 07/05/2014   Vacuum extraction, delivered, current hospitalization 07/05/2014   Preterm labor 07/03/2014   Admitted with dehydration 06/03/2014   Gastroenteritis 06/02/2014   Major depressive disorder, recurrent episode, moderate (HCC) 03/11/2014   GAD (generalized anxiety disorder) 03/11/2014   Rheumatoid arthritis (HCC) 09/13/2013   Depressive disorder, not elsewhere classified 07/04/2012   Family history of cleft lip 11/14/2011   Generalized anxiety disorder 09/20/2011   NAUSEA AND VOMITING 12/11/2010   Abdominal pain 12/11/2010   Esophageal reflux 11/17/2010   Constipation, slow transit 11/17/2010   Spondylosis, lumbosacral 11/17/2010   Anxiety and depression 11/17/2010   Esophageal dysphagia 11/17/2010   Anxiety state 09/04/2010    PCP: Johnny Garnette LABOR, MD  REFERRING PROVIDER: Debby Dorn MATSU, MD  REFERRING DIAG: Cervical radiculopathy [M54.12]   THERAPY DIAG:  Cervicalgia  Abnormal posture  Rationale for Evaluation and Treatment: Rehabilitation  ONSET DATE: 11/22/2023 date of referral   SUBJECTIVE:                                                                                                                                                                                                         SUBJECTIVE STATEMENT:  Will undergo MRI later today.  Reports some achy sensation midline with increased symptoms with L rotation.  Hand dominance:  Right  PERTINENT HISTORY:  Relevant PMHx includes ADHD, Anxiety, Depression, Dyspnea,  Esophageal dysphagia, Esophageal reflux, RA, Tachycardia, L4-L5 fusion (2008) for spondylolistehesis   PAIN:  Are you having pain? Yes: NPRS scale: 4-5/10 current, 7/10 at worst  Pain location: mid cervical spine  Pain description: aching, burning, heavy, headache  Aggravating factors: turning head  Relieving factors: laying down (temporarily)    PRECAUTIONS: None  RED FLAGS: None     WEIGHT BEARING RESTRICTIONS: No  FALLS:  Has patient fallen in last 6 months? No  LIVING ENVIRONMENT: Lives with: lives with their family and lives with their spouse Stairs: Yes with railing present   OCCUPATION: Nurse   PLOF: Independent  PATIENT GOALS: to have less pain in neck with daily activities   NEXT MD VISIT: 01/03/2024  OBJECTIVE:  Note: Objective measures were completed at Evaluation unless otherwise noted.   PATIENT SURVEYS:  NDI:  NECK DISABILITY INDEX  Date: 11/30/2023 Score  Pain intensity 2 = The pain is moderate at the moment  2. Personal care (washing, dressing, etc.) 0 = I can look after myself normally without causing extra pain  3. Lifting 0 =  I can lift heavy weights without extra pain  4. Reading 2 =  I can read as much as I want with moderate pain in my neck  5. Headaches 1 =  I have slight headaches, which come infrequently  6. Concentration 0 =  I can concentrate fully when I want to with no difficulty  7. Work 0 =  I can do as much work as I want to  8. Driving 1 =  I can drive my car as long as I want with slight pain in my neck  9. Sleeping 1 = My sleep is slightly disturbed (less than 1 hr sleepless)  10. Recreation 1 =  I am able to engage in all my recreation activities, with some pain in my neck  Total 8/50   Minimum Detectable Change (90% confidence): 5 points or 10% points  COGNITION: Overall cognitive status: Within functional limits for tasks assessed  SENSATION: Not tested  POSTURE: forward head   Cervical MMT: 4/5 MMT in all  directions   Pain with all directions, in same place as primary symptoms   CERVICAL ROM:   Active ROM A/PROM (deg) eval  Flexion 45  Extension 40  Right lateral flexion 30  Left lateral flexion 40  Right rotation 64  Left rotation 45   (Blank rows = not tested)  UPPER EXTREMITY ROM: WFL BIL   Active ROM Right eval Left eval  Shoulder flexion 4 4-  Shoulder extension    Shoulder abduction 4+ 4  Shoulder adduction    Shoulder extension    Shoulder internal rotation 4+ 4+  Shoulder external rotation 4 4  Elbow flexion 4+ 4+  Elbow extension 5 5  Wrist flexion    Wrist extension    Wrist ulnar deviation    Wrist radial deviation    Wrist pronation    Wrist supination     (Blank rows = not tested)  UPPER EXTREMITY MMT:  MMT Right eval Left eval  Shoulder flexion    Shoulder extension    Shoulder abduction    Shoulder adduction    Shoulder extension    Shoulder internal rotation    Shoulder external rotation    Middle trapezius    Lower trapezius    Elbow flexion    Elbow extension  Wrist flexion    Wrist extension    Wrist ulnar deviation    Wrist radial deviation    Wrist pronation    Wrist supination    Grip strength     (Blank rows = not tested)  CERVICAL SPECIAL TESTS:  Cranial cervical flexion test: Positive and Neck flexor muscle endurance test: Positive     TREATMENT DATE:  [facility] Adult PT Treatment:                                                DATE: 12/06/23 Therapeutic Exercise: Nustep L2 8 min Chin tuck 10x Manual Therapy: Manual L scalene stretch 30s x3 Manual L levator stretch 30s x2 SO release 5 min Therapeutic Activity: OH flexion with stick 10x emphasizing breathing patterns Supine OH flexion alternating UES 1# 15/15 Supine hor abd YTB 15x   [facility] Adult PT Treatment:                                                DATE: 11/30/2023   Initial evaluation: see patient education and home exercise program as noted below                                                                                                                                    PATIENT EDUCATION:  Education details: reviewed initial home exercise program; discussion of POC, prognosis and goals for skilled PT   Person educated: Patient Education method: Explanation, Demonstration, and Handouts Education comprehension: verbalized understanding, returned demonstration, and needs further education  HOME EXERCISE PROGRAM: Access Code: [email or web address] URL: [email or web address] Date: 12/06/2023 Prepared by: Marko Molt  Exercises - Supine Cervical Retraction with Towel  - 2 x daily - 7 x weekly - 2 sets - 10 reps - 3 sec hold - Seated Scapular Retraction  - 2 x daily - 7 x weekly - 2 sets - 10 reps - 3 sec hold - Prone W Scapular Retraction  - 2 x daily - 7 x weekly - 2 sets - 10 reps  ASSESSMENT:  CLINICAL IMPRESSION:  First f/u session.  Treatment focus was aerobic w/u, HEP review, postural training and soft tissue stretch and mobilization.  Manual stretching to L cervical and posterior shoulder region.  Added SO release with little relief of symptoms.  Discomfort reported centrally as opposed.  Elevated L scapular position resolved with L levatore stretch.   [name] is a 38 y.o. female who was seen today for physical therapy evaluation and treatment for Neck pain with mobility deficits. She is demonstrating decreased CS MMT, decreased UE MMT, and decreased CS and postural mm endurance. She has related pain and difficulty  with turning her head, holding her head up throughout the day, and holding head down for reading tasks. She requires skilled PT services at this time to address relevant deficits and improve overall function.     OBJECTIVE IMPAIRMENTS: decreased activity tolerance, decreased endurance, decreased mobility, decreased ROM, decreased strength, impaired UE functional use, postural dysfunction, and pain.   ACTIVITY  LIMITATIONS: carrying, lifting, reach over head, hygiene/grooming, and caring for others  PARTICIPATION LIMITATIONS: meal prep, cleaning, laundry, interpersonal relationship, driving, community activity, and occupation  PERSONAL FACTORS: Time since onset of injury/illness/exacerbation and 3+ comorbidities: Relevant PMHx includes ADHD, Anxiety, Depression, Dyspnea, Esophageal dysphagia, Esophageal reflux, RA, Tachycardia, L4-L5 fusion (2008) for spondylolistehesis are also affecting patient's functional outcome.   REHAB POTENTIAL: Fair    CLINICAL DECISION MAKING: Evolving/moderate complexity  EVALUATION COMPLEXITY: Moderate   GOALS: Goals reviewed with patient? YES  SHORT TERM GOALS: Target date: 12/21/2023   Patient will be independent with initial home program at least 3 days/week.  Baseline: provided at eval Goal Status: INITIAL   2.  Patient will demonstrate improved postural awareness for at least 15 minutes while seated without need for cueing from PT.  Baseline: see objective measures Goal Status: INITIAL   3.  Patient will demonstrate improved active R lateral flexion and Left rotation by at least 5 degrees.  Baseline: see objective measures Goal status: INITIAL   LONG TERM GOALS: Target date: 01/11/2024   Patient will report improved overall functional ability with NDI score of 3/50 or less.  Baseline: 8/50 Goal Status: INITIAL    2.  Patient will demonstrate improved UQ strength to at least 4+/5 MMT BIL.  Baseline: see objective measures.  Goal status: INITIAL  3.  Patient will be able to maintain cervical flexion endurance test for at least 25 seconds Baseline: <15 seconds Goal status: INITIAL  4.  Patient will report minimal to no pain with turning her head while driving, nor with work related activities Baseline: moderate pain and difficulty/reported heaviness.  Goal status: INITIAL     PLAN:  PT FREQUENCY: 1-2x/week  PT DURATION: 6  weeks  PLANNED INTERVENTIONS: 02835- PT Re-evaluation, 97750- Physical Performance Testing, 97110-Therapeutic exercises, 97530- Therapeutic activity, [id number]- Neuromuscular re-education, 97535- Self Care, 02859- Manual therapy, [id number]- Electrical stimulation (unattended), 02987- Traction (mechanical), 20560 (1-2 muscles), 20561 (3+ muscles)- Dry Needling, Patient/Family education, Taping, Joint mobilization, Spinal mobilization, Cryotherapy, and Moist heat  PLAN FOR NEXT SESSION: begin CS mobility and strengthening program as indicated. Address CS and periscapular mm endurance.    Jeff Anahita Cua PT  12/06/2023 11:33 AM

## 2023-12-08 DIAGNOSIS — F4310 Post-traumatic stress disorder, unspecified: Secondary | ICD-10-CM | POA: Diagnosis not present

## 2023-12-08 DIAGNOSIS — F332 Major depressive disorder, recurrent severe without psychotic features: Secondary | ICD-10-CM | POA: Diagnosis not present

## 2023-12-11 ENCOUNTER — Other Ambulatory Visit: Payer: Self-pay

## 2023-12-14 ENCOUNTER — Other Ambulatory Visit: Payer: Self-pay

## 2023-12-14 ENCOUNTER — Other Ambulatory Visit (HOSPITAL_COMMUNITY): Payer: Self-pay

## 2023-12-14 MED ORDER — LISDEXAMFETAMINE DIMESYLATE 50 MG PO CAPS
50.0000 mg | ORAL_CAPSULE | Freq: Every morning | ORAL | 0 refills | Status: DC
  Filled 2023-12-14: qty 30, 30d supply, fill #0

## 2023-12-15 ENCOUNTER — Other Ambulatory Visit (HOSPITAL_COMMUNITY): Payer: Self-pay

## 2023-12-16 ENCOUNTER — Other Ambulatory Visit (HOSPITAL_COMMUNITY): Payer: Self-pay

## 2023-12-20 ENCOUNTER — Ambulatory Visit

## 2023-12-20 DIAGNOSIS — Z6828 Body mass index (BMI) 28.0-28.9, adult: Secondary | ICD-10-CM | POA: Diagnosis not present

## 2023-12-20 DIAGNOSIS — M542 Cervicalgia: Secondary | ICD-10-CM

## 2023-12-20 DIAGNOSIS — M5412 Radiculopathy, cervical region: Secondary | ICD-10-CM | POA: Diagnosis not present

## 2023-12-20 DIAGNOSIS — R293 Abnormal posture: Secondary | ICD-10-CM | POA: Diagnosis not present

## 2023-12-20 NOTE — Therapy (Signed)
 OUTPATIENT PHYSICAL THERAPY TREATMENT NOTE   Patient Name: [name] MRN: [id number] DOB:[birth year], 38 y.o., female Today's Date: 12/20/2023  END OF SESSION:  PT End of Session - 12/20/23 0825     Visit Number 3    Number of Visits 12    Date for PT Re-Evaluation 01/11/24    Authorization Type [facility] [employer]    PT Start Time 0830    PT Stop Time 0908    PT Time Calculation (min) 38 min    Activity Tolerance Patient tolerated treatment well    Behavior During Therapy [facility] for tasks assessed/performed          Past Medical History:  Diagnosis Date   Abdominal pain 12/11/2010   Qualifier: Diagnosis of   By: Charlott MD, Reyes SCULL SNOMED Dx Update Oct 2024     Abnormal Pap smear of cervix    ADHD 09/30/2016   Admitted with dehydration 06/03/2014   Anxiety and depression 11/17/2010   she sees Amy Idell Chime in [place]   At risk for deficient intake of food 03/11/2020   At risk for dehydration 05/06/2020   At risk for diabetes mellitus 02/27/2020   At risk for impaired metabolic function 04/13/2020   At risk for side effect of medication 06/25/2020   BMI 28.0-28.9,adult 09/08/2022   Brachydactyly of fingers    Class 1 obesity with serious comorbidity and body mass index (BMI) of 33.0 to 33.9 in adult 07/13/2020   Constipation, slow transit 11/17/2010   Depression    Dyspnea 02/23/2015   Followed in Pulmonary clinic/ [facility]/ [place]   - 02/23/2015  Walked RA x 3 laps @ 185 ft each stopped due to End of study,  Brisk pace, mild  sob  sats 93%     - D dimer   02/23/15 Pos 0.58 > CTa neg for PE or ILD   03/31/15 >FEV1 105%, ratio 80 and FVC 114%.   - CPST  04/09/15>  There is no evidence of exercise-induced bronchospasm or ventilatory limitations. Low OUES and O2 pulse combi   Esophageal dysphagia 11/17/2010   Esophageal reflux 11/17/2010   Family history of cleft lip 11/14/2011   GAD (generalized anxiety disorder) 03/11/2014   Gastroenteritis  06/02/2014   Generalized anxiety disorder 09/20/2011   GERD (gastroesophageal reflux disease)    History of vitamin D  deficiency 06/15/2022   HPV (human papilloma virus) infection    Insulin  resistance 03/23/2020   Major depressive disorder, recurrent episode, moderate (HCC) 03/11/2014   Mood disorder (HCC),with emotional eating 03/23/2020   NAUSEA AND VOMITING 12/11/2010   Qualifier: Diagnosis of   By: Charlott MD, Jeffrey         Other hyperlipidemia 06/03/2020   Polyphagia 10/07/2021   Rheumatoid arthritis (HCC) 09/13/2013   Right knee pain 09/06/2016   Spondylosis, lumbosacral 11/17/2010   Tachycardia 01/22/2016   Tracheomalacia, congenital    Vitamin D  deficiency 02/27/2020   Past Surgical History:  Procedure Laterality Date   BACK SURGERY     HAND SURGERY  1996   to elongate the right 3rd metacarpal    LUMBAR FUSION  2008   per Dr. Carles on L4-5 for spondylolisthesis    NASAL SEPTUM SURGERY  01/01/2018   SHOULDER ARTHROSCOPY     Patient Active Problem List   Diagnosis Date Noted   Lung nodules 09/28/2023   DOE (dyspnea on exertion) 08/30/2023   Hypokalemia 08/30/2023   Abnormal Pap smear of cervix  Anxiety    Brachydactyly of fingers    GERD (gastroesophageal reflux disease)    HPV (human papilloma virus) infection    Pregnancy    Tracheomalacia, congenital    BMI 28.0-28.9,adult 09/08/2022   History of vitamin D  deficiency 06/15/2022   Polyphagia 10/07/2021   Class 1 obesity with serious comorbidity and body mass index (BMI) of 33.0 to 33.9 in adult 07/13/2020   At risk for side effect of medication 06/25/2020   Other hyperlipidemia 06/03/2020   At risk for dehydration 05/06/2020   At risk for impaired metabolic function 04/13/2020   Mood disorder (HCC),with emotional eating 03/23/2020   Insulin  resistance 03/23/2020   At risk for deficient intake of food 03/11/2020   Vitamin D  deficiency 02/27/2020   At risk for diabetes mellitus 02/27/2020    Depression 02/27/2020   ADHD 09/30/2016   Right knee pain 09/06/2016   Tachycardia 01/22/2016   Dyspnea 02/23/2015   Threatened preterm labor 07/05/2014   Preterm contractions 07/05/2014   Vacuum extraction, delivered, current hospitalization 07/05/2014   Preterm labor 07/03/2014   Admitted with dehydration 06/03/2014   Gastroenteritis 06/02/2014   Major depressive disorder, recurrent episode, moderate (HCC) 03/11/2014   GAD (generalized anxiety disorder) 03/11/2014   Rheumatoid arthritis (HCC) 09/13/2013   Depressive disorder, not elsewhere classified 07/04/2012   Family history of cleft lip 11/14/2011   Generalized anxiety disorder 09/20/2011   NAUSEA AND VOMITING 12/11/2010   Abdominal pain 12/11/2010   Esophageal reflux 11/17/2010   Constipation, slow transit 11/17/2010   Spondylosis, lumbosacral 11/17/2010   Anxiety and depression 11/17/2010   Esophageal dysphagia 11/17/2010   Anxiety state 09/04/2010    PCP: Johnny Garnette LABOR, MD  REFERRING PROVIDER: Debby Dorn MATSU, MD  REFERRING DIAG: Cervical radiculopathy [M54.12]   THERAPY DIAG:  Cervicalgia  Abnormal posture  Rationale for Evaluation and Treatment: Rehabilitation  ONSET DATE: 11/22/2023 date of referral   SUBJECTIVE:                                                                                                                                                                                                         SUBJECTIVE STATEMENT:   Patient reports that she felt a little worse after last session, but has been feeling better since then. She states she will receive MRI results today.   Hand dominance: Right  PERTINENT HISTORY:  Relevant PMHx includes ADHD, Anxiety, Depression, Dyspnea, Esophageal dysphagia, Esophageal reflux, RA, Tachycardia, L4-L5 fusion (2008) for spondylolistehesis   PAIN:  Are you having pain? Yes: NPRS scale: 4-5/10 current, 7/10  at worst  Pain location: mid cervical spine   Pain description: aching, burning, heavy, headache  Aggravating factors: turning head  Relieving factors: laying down (temporarily)    PRECAUTIONS: None  RED FLAGS: None     WEIGHT BEARING RESTRICTIONS: No  FALLS:  Has patient fallen in last 6 months? No  LIVING ENVIRONMENT: Lives with: lives with their family and lives with their spouse Stairs: Yes with railing present   OCCUPATION: Nurse   PLOF: Independent  PATIENT GOALS: to have less pain in neck with daily activities   NEXT MD VISIT: 01/03/2024  OBJECTIVE:  Note: Objective measures were completed at Evaluation unless otherwise noted.   PATIENT SURVEYS:  NDI:  NECK DISABILITY INDEX  Date: 11/30/2023 Score  Pain intensity 2 = The pain is moderate at the moment  2. Personal care (washing, dressing, etc.) 0 = I can look after myself normally without causing extra pain  3. Lifting 0 =  I can lift heavy weights without extra pain  4. Reading 2 =  I can read as much as I want with moderate pain in my neck  5. Headaches 1 =  I have slight headaches, which come infrequently  6. Concentration 0 =  I can concentrate fully when I want to with no difficulty  7. Work 0 =  I can do as much work as I want to  8. Driving 1 =  I can drive my car as long as I want with slight pain in my neck  9. Sleeping 1 = My sleep is slightly disturbed (less than 1 hr sleepless)  10. Recreation 1 =  I am able to engage in all my recreation activities, with some pain in my neck  Total 8/50   Minimum Detectable Change (90% confidence): 5 points or 10% points  COGNITION: Overall cognitive status: Within functional limits for tasks assessed  SENSATION: Not tested  POSTURE: forward head   Cervical MMT: 4/5 MMT in all directions   Pain with all directions, in same place as primary symptoms   CERVICAL ROM:   Active ROM A/PROM (deg) eval  Flexion 45  Extension 40  Right lateral flexion 30  Left lateral flexion 40  Right rotation 64   Left rotation 45   (Blank rows = not tested)  UPPER EXTREMITY ROM: WFL BIL   Active ROM Right eval Left eval  Shoulder flexion 4 4-  Shoulder extension    Shoulder abduction 4+ 4  Shoulder adduction    Shoulder extension    Shoulder internal rotation 4+ 4+  Shoulder external rotation 4 4  Elbow flexion 4+ 4+  Elbow extension 5 5  Wrist flexion    Wrist extension    Wrist ulnar deviation    Wrist radial deviation    Wrist pronation    Wrist supination     (Blank rows = not tested)  UPPER EXTREMITY MMT:  MMT Right eval Left eval  Shoulder flexion    Shoulder extension    Shoulder abduction    Shoulder adduction    Shoulder extension    Shoulder internal rotation    Shoulder external rotation    Middle trapezius    Lower trapezius    Elbow flexion    Elbow extension    Wrist flexion    Wrist extension    Wrist ulnar deviation    Wrist radial deviation    Wrist pronation    Wrist supination    Grip strength     (  Blank rows = not tested)  CERVICAL SPECIAL TESTS:  Cranial cervical flexion test: Positive and Neck flexor muscle endurance test: Positive   TREATMENT DATE:  [facility] Adult PT Treatment:                                                DATE: 12/20/23 Therapeutic Exercise: Nustep L5 8 min Chin tuck x15 Therapeutic Activity: Rows GTB 2x10 - cues for posture Shoulder GTB 2x10 - cues for posture Seated BIL ER with scap retraction RTB 2x10 OH flexion with stick x15 emphasizing breathing patterns Supine OH flexion alternating UES 1# 10/10 Supine diagonals RTB x10 BIL Supine hor abd RTB 2x10 Self Care: Theracane self instruction MTPR and tack and stretch  [facility] Adult PT Treatment:                                                DATE: 12/06/23 Therapeutic Exercise: Nustep L2 8 min Chin tuck 10x Manual Therapy: Manual L scalene stretch 30s x3 Manual L levator stretch 30s x2 SO release 5 min Therapeutic Activity: OH flexion with stick 10x emphasizing  breathing patterns Supine OH flexion alternating UES 1# 15/15 Supine hor abd YTB 15x   [facility] Adult PT Treatment:                                                DATE: 11/30/2023   Initial evaluation: see patient education and home exercise program as noted below                                                                                                                                 PATIENT EDUCATION:  Education details: reviewed initial home exercise program; discussion of POC, prognosis and goals for skilled PT   Person educated: Patient Education method: Explanation, Demonstration, and Handouts Education comprehension: verbalized understanding, returned demonstration, and needs further education  HOME EXERCISE PROGRAM: Access Code: [email or web address] URL: [email or web address] Date: 12/06/2023 Prepared by: Marko Molt  Exercises - Supine Cervical Retraction with Towel  - 2 x daily - 7 x weekly - 2 sets - 10 reps - 3 sec hold - Seated Scapular Retraction  - 2 x daily - 7 x weekly - 2 sets - 10 reps - 3 sec hold - Prone W Scapular Retraction  - 2 x daily - 7 x weekly - 2 sets - 10 reps  ASSESSMENT:  CLINICAL IMPRESSION:  Patient presents to PT reporting minimal pain currently and that she felt increased pain after the last session but has  been feeling better since then. Session today continued to focus on periscapular strengthening and scapular mobility to decrease her pain and tension. Patient was able to tolerate all prescribed exercises with no adverse effects. Patient continues to benefit from skilled PT services and should be progressed as able to improve functional independence.   EVAL: Nishat is a 38 y.o. female who was seen today for physical therapy evaluation and treatment for Neck pain with mobility deficits. She is demonstrating decreased CS MMT, decreased UE MMT, and decreased CS and postural mm endurance. She has related pain and difficulty with turning her  head, holding her head up throughout the day, and holding head down for reading tasks. She requires skilled PT services at this time to address relevant deficits and improve overall function.     OBJECTIVE IMPAIRMENTS: decreased activity tolerance, decreased endurance, decreased mobility, decreased ROM, decreased strength, impaired UE functional use, postural dysfunction, and pain.   ACTIVITY LIMITATIONS: carrying, lifting, reach over head, hygiene/grooming, and caring for others  PARTICIPATION LIMITATIONS: meal prep, cleaning, laundry, interpersonal relationship, driving, community activity, and occupation  PERSONAL FACTORS: Time since onset of injury/illness/exacerbation and 3+ comorbidities: Relevant PMHx includes ADHD, Anxiety, Depression, Dyspnea, Esophageal dysphagia, Esophageal reflux, RA, Tachycardia, L4-L5 fusion (2008) for spondylolistehesis are also affecting patient's functional outcome.   REHAB POTENTIAL: Fair    CLINICAL DECISION MAKING: Evolving/moderate complexity  EVALUATION COMPLEXITY: Moderate   GOALS: Goals reviewed with patient? YES  SHORT TERM GOALS: Target date: 12/21/2023   Patient will be independent with initial home program at least 3 days/week.  Baseline: provided at eval Goal Status: MET 12/20/23: Pt reports adherence   2.  Patient will demonstrate improved postural awareness for at least 15 minutes while seated without need for cueing from PT.  Baseline: see objective measures Goal Status: Ongoing   3.  Patient will demonstrate improved active R lateral flexion and Left rotation by at least 5 degrees.  Baseline: see objective measures Goal status: Ongoing   LONG TERM GOALS: Target date: 01/11/2024   Patient will report improved overall functional ability with NDI score of 3/50 or less.  Baseline: 8/50 Goal Status: INITIAL    2.  Patient will demonstrate improved UQ strength to at least 4+/5 MMT BIL.  Baseline: see objective measures.  Goal  status: INITIAL  3.  Patient will be able to maintain cervical flexion endurance test for at least 25 seconds Baseline: <15 seconds Goal status: INITIAL  4.  Patient will report minimal to no pain with turning her head while driving, nor with work related activities Baseline: moderate pain and difficulty/reported heaviness.  Goal status: INITIAL   PLAN:  PT FREQUENCY: 1-2x/week  PT DURATION: 6 weeks  PLANNED INTERVENTIONS: 02835- PT Re-evaluation, 97750- Physical Performance Testing, 97110-Therapeutic exercises, 97530- Therapeutic activity, [id number]- Neuromuscular re-education, 97535- Self Care, 02859- Manual therapy, [id number]- Electrical stimulation (unattended), 02987- Traction (mechanical), 20560 (1-2 muscles), 20561 (3+ muscles)- Dry Needling, Patient/Family education, Taping, Joint mobilization, Spinal mobilization, Cryotherapy, and Moist heat  PLAN FOR NEXT SESSION: begin CS mobility and strengthening program as indicated. Address CS and periscapular mm endurance.    Corean Pouch PTA  12/20/2023 9:08 AM

## 2023-12-26 ENCOUNTER — Ambulatory Visit: Attending: Family Medicine

## 2023-12-26 DIAGNOSIS — R293 Abnormal posture: Secondary | ICD-10-CM | POA: Diagnosis not present

## 2023-12-26 DIAGNOSIS — M542 Cervicalgia: Secondary | ICD-10-CM | POA: Insufficient documentation

## 2023-12-26 NOTE — Therapy (Signed)
 OUTPATIENT PHYSICAL THERAPY TREATMENT NOTE   Patient Name: Tyanne Derocher MRN: [id number] DOB:1985-06-05, 38 y.o., female Today's Date: 12/26/2023  END OF SESSION:  PT End of Session - 12/26/23 1739     Visit Number 4    Number of Visits 12    Date for PT Re-Evaluation 01/11/24    Authorization Type [facility] [employer]    PT Start Time 1743    PT Stop Time 1821    PT Time Calculation (min) 38 min    Activity Tolerance Patient tolerated treatment well    Behavior During Therapy [facility] for tasks assessed/performed          Past Medical History:  Diagnosis Date   Abdominal pain 12/11/2010   Qualifier: Diagnosis of   By: Charlott MD, Reyes SCULL SNOMED Dx Update Oct 2024     Abnormal Pap smear of cervix    ADHD 09/30/2016   Admitted with dehydration 06/03/2014   Anxiety and depression 11/17/2010   she sees Amy Idell Chime in [place]   At risk for deficient intake of food 03/11/2020   At risk for dehydration 05/06/2020   At risk for diabetes mellitus 02/27/2020   At risk for impaired metabolic function 04/13/2020   At risk for side effect of medication 06/25/2020   BMI 28.0-28.9,adult 09/08/2022   Brachydactyly of fingers    Class 1 obesity with serious comorbidity and body mass index (BMI) of 33.0 to 33.9 in adult 07/13/2020   Constipation, slow transit 11/17/2010   Depression    Dyspnea 02/23/2015   Followed in Pulmonary clinic/ [facility]/ [place]   - 02/23/2015  Walked RA x 3 laps @ 185 ft each stopped due to End of study,  Brisk pace, mild  sob  sats 93%     - D dimer   02/23/15 Pos 0.58 > CTa neg for PE or ILD   03/31/15 >FEV1 105%, ratio 80 and FVC 114%.   - CPST  04/09/15>  There is no evidence of exercise-induced bronchospasm or ventilatory limitations. Low OUES and O2 pulse combi   Esophageal dysphagia 11/17/2010   Esophageal reflux 11/17/2010   Family history of cleft lip 11/14/2011   GAD (generalized anxiety disorder) 03/11/2014   Gastroenteritis  06/02/2014   Generalized anxiety disorder 09/20/2011   GERD (gastroesophageal reflux disease)    History of vitamin D  deficiency 06/15/2022   HPV (human papilloma virus) infection    Insulin  resistance 03/23/2020   Major depressive disorder, recurrent episode, moderate (HCC) 03/11/2014   Mood disorder (HCC),with emotional eating 03/23/2020   NAUSEA AND VOMITING 12/11/2010   Qualifier: Diagnosis of   By: Charlott MD, Jeffrey         Other hyperlipidemia 06/03/2020   Polyphagia 10/07/2021   Rheumatoid arthritis (HCC) 09/13/2013   Right knee pain 09/06/2016   Spondylosis, lumbosacral 11/17/2010   Tachycardia 01/22/2016   Tracheomalacia, congenital    Vitamin D  deficiency 02/27/2020   Past Surgical History:  Procedure Laterality Date   BACK SURGERY     HAND SURGERY  1996   to elongate the right 3rd metacarpal    LUMBAR FUSION  2008   per Dr. Carles on L4-5 for spondylolisthesis    NASAL SEPTUM SURGERY  01/01/2018   SHOULDER ARTHROSCOPY     Patient Active Problem List   Diagnosis Date Noted   Lung nodules 09/28/2023   DOE (dyspnea on exertion) 08/30/2023   Hypokalemia 08/30/2023   Abnormal Pap smear of cervix  Anxiety    Brachydactyly of fingers    GERD (gastroesophageal reflux disease)    HPV (human papilloma virus) infection    Pregnancy    Tracheomalacia, congenital    BMI 28.0-28.9,adult 09/08/2022   History of vitamin D  deficiency 06/15/2022   Polyphagia 10/07/2021   Class 1 obesity with serious comorbidity and body mass index (BMI) of 33.0 to 33.9 in adult 07/13/2020   At risk for side effect of medication 06/25/2020   Other hyperlipidemia 06/03/2020   At risk for dehydration 05/06/2020   At risk for impaired metabolic function 04/13/2020   Mood disorder (HCC),with emotional eating 03/23/2020   Insulin  resistance 03/23/2020   At risk for deficient intake of food 03/11/2020   Vitamin D  deficiency 02/27/2020   At risk for diabetes mellitus 02/27/2020    Depression 02/27/2020   ADHD 09/30/2016   Right knee pain 09/06/2016   Tachycardia 01/22/2016   Dyspnea 02/23/2015   Threatened preterm labor 07/05/2014   Preterm contractions 07/05/2014   Vacuum extraction, delivered, current hospitalization 07/05/2014   Preterm labor 07/03/2014   Admitted with dehydration 06/03/2014   Gastroenteritis 06/02/2014   Major depressive disorder, recurrent episode, moderate (HCC) 03/11/2014   GAD (generalized anxiety disorder) 03/11/2014   Rheumatoid arthritis (HCC) 09/13/2013   Depressive disorder, not elsewhere classified 07/04/2012   Family history of cleft lip 11/14/2011   Generalized anxiety disorder 09/20/2011   NAUSEA AND VOMITING 12/11/2010   Abdominal pain 12/11/2010   Esophageal reflux 11/17/2010   Constipation, slow transit 11/17/2010   Spondylosis, lumbosacral 11/17/2010   Anxiety and depression 11/17/2010   Esophageal dysphagia 11/17/2010   Anxiety state 09/04/2010    PCP: Johnny Garnette LABOR, MD  REFERRING PROVIDER: Debby Dorn MATSU, MD  REFERRING DIAG: Cervical radiculopathy [M54.12]   THERAPY DIAG:  Cervicalgia  Abnormal posture  Rationale for Evaluation and Treatment: Rehabilitation  ONSET DATE: 11/22/2023 date of referral   SUBJECTIVE:                                                                                                                                                                                                         SUBJECTIVE STATEMENT:     Patient reports that she received MRI results, stating it said that everything was good, and they said we can continue with PT without concern.    Hand dominance: Right  PERTINENT HISTORY:  Relevant PMHx includes ADHD, Anxiety, Depression, Dyspnea, Esophageal dysphagia, Esophageal reflux, RA, Tachycardia, L4-L5 fusion (2008) for spondylolistehesis   PAIN:  Are you having pain? Yes: NPRS scale: 4-5/10 current,  7/10 at worst  Pain location: mid cervical spine   Pain description: aching, burning, heavy, headache  Aggravating factors: turning head  Relieving factors: laying down (temporarily)    PRECAUTIONS: None  RED FLAGS: None     WEIGHT BEARING RESTRICTIONS: No  FALLS:  Has patient fallen in last 6 months? No  LIVING ENVIRONMENT: Lives with: lives with their family and lives with their spouse Stairs: Yes with railing present   OCCUPATION: Nurse   PLOF: Independent  PATIENT GOALS: to have less pain in neck with daily activities   NEXT MD VISIT: 01/03/2024  OBJECTIVE:  Note: Objective measures were completed at Evaluation unless otherwise noted.   PATIENT SURVEYS:  NDI:  NECK DISABILITY INDEX  Date: 11/30/2023 Score  Pain intensity 2 = The pain is moderate at the moment  2. Personal care (washing, dressing, etc.) 0 = I can look after myself normally without causing extra pain  3. Lifting 0 =  I can lift heavy weights without extra pain  4. Reading 2 =  I can read as much as I want with moderate pain in my neck  5. Headaches 1 =  I have slight headaches, which come infrequently  6. Concentration 0 =  I can concentrate fully when I want to with no difficulty  7. Work 0 =  I can do as much work as I want to  8. Driving 1 =  I can drive my car as long as I want with slight pain in my neck  9. Sleeping 1 = My sleep is slightly disturbed (less than 1 hr sleepless)  10. Recreation 1 =  I am able to engage in all my recreation activities, with some pain in my neck  Total 8/50   Minimum Detectable Change (90% confidence): 5 points or 10% points  COGNITION: Overall cognitive status: Within functional limits for tasks assessed  SENSATION: Not tested  POSTURE: forward head   Cervical MMT: 4/5 MMT in all directions   Pain with all directions, in same place as primary symptoms   CERVICAL ROM:   Active ROM A/PROM (deg) eval  Flexion 45  Extension 40  Right lateral flexion 30  Left lateral flexion 40  Right rotation 64   Left rotation 45   (Blank rows = not tested)  UPPER EXTREMITY ROM: WFL BIL   Active ROM Right eval Left eval  Shoulder flexion 4 4-  Shoulder extension    Shoulder abduction 4+ 4  Shoulder adduction    Shoulder extension    Shoulder internal rotation 4+ 4+  Shoulder external rotation 4 4  Elbow flexion 4+ 4+  Elbow extension 5 5  Wrist flexion    Wrist extension    Wrist ulnar deviation    Wrist radial deviation    Wrist pronation    Wrist supination     (Blank rows = not tested)  UPPER EXTREMITY MMT:  MMT Right eval Left eval  Shoulder flexion    Shoulder extension    Shoulder abduction    Shoulder adduction    Shoulder extension    Shoulder internal rotation    Shoulder external rotation    Middle trapezius    Lower trapezius    Elbow flexion    Elbow extension    Wrist flexion    Wrist extension    Wrist ulnar deviation    Wrist radial deviation    Wrist pronation    Wrist supination    Grip strength     (  Blank rows = not tested)  CERVICAL SPECIAL TESTS:  Cranial cervical flexion test: Positive and Neck flexor muscle endurance test: Positive   TREATMENT DATE:   [facility] Adult PT Treatment:                                                DATE: 12/26/2023  Therapeutic Exercise: Nustep L5 6 min Chin tuck x15  Therapeutic Activity: Rows GTB 2x10 - cues for posture Shoulder extension GTB 2x10 - cues for posture Seated BIL ER with scap retraction RTB 2x10 Supine diagonals RTB x10 BIL Supine hor abd RTB 2x10 CS isometric 3 sec x 10 in all directions  CS self-SNAG x 10     [facility] Adult PT Treatment:                                                DATE: 12/20/23 Therapeutic Exercise: Nustep L5 8 min Chin tuck x15 Therapeutic Activity: Rows GTB 2x10 - cues for posture Shoulder GTB 2x10 - cues for posture Seated BIL ER with scap retraction RTB 2x10 OH flexion with stick x15 emphasizing breathing patterns Supine OH flexion alternating UES 1#  10/10 Supine diagonals RTB x10 BIL Supine hor abd RTB 2x10 Self Care: Theracane self instruction MTPR and tack and stretch  [facility] Adult PT Treatment:                                                DATE: 12/06/23 Therapeutic Exercise: Nustep L2 8 min Chin tuck 10x Manual Therapy: Manual L scalene stretch 30s x3 Manual L levator stretch 30s x2 SO release 5 min Therapeutic Activity: OH flexion with stick 10x emphasizing breathing patterns Supine OH flexion alternating UES 1# 15/15 Supine hor abd YTB 15x   [facility] Adult PT Treatment:                                                DATE: 11/30/2023   Initial evaluation: see patient education and home exercise program as noted below                                                                                                                                 PATIENT EDUCATION:  Education details: reviewed initial home exercise program; discussion of POC, prognosis and goals for skilled PT   Person educated: Patient Education method: Explanation, Demonstration, and Handouts Education comprehension: verbalized  understanding, returned demonstration, and needs further education  HOME EXERCISE PROGRAM: Access Code: [email or web address] URL: [email or web address] Date: 12/06/2023 Prepared by: Marko Molt  Exercises - Supine Cervical Retraction with Towel  - 2 x daily - 7 x weekly - 2 sets - 10 reps - 3 sec hold - Seated Scapular Retraction  - 2 x daily - 7 x weekly - 2 sets - 10 reps - 3 sec hold - Prone W Scapular Retraction  - 2 x daily - 7 x weekly - 2 sets - 10 reps  ASSESSMENT:  CLINICAL IMPRESSION:  12/26/2023 Chundra had good tolerance of today's treatment session, with additions of CS isometrics and self-SNAG technique. We will continue to progress per POC as tolerated, in order to reach established rehab goals.   EVAL: Joniah is a 38 y.o. female who was seen today for physical therapy evaluation and treatment for Neck  pain with mobility deficits. She is demonstrating decreased CS MMT, decreased UE MMT, and decreased CS and postural mm endurance. She has related pain and difficulty with turning her head, holding her head up throughout the day, and holding head down for reading tasks. She requires skilled PT services at this time to address relevant deficits and improve overall function.     OBJECTIVE IMPAIRMENTS: decreased activity tolerance, decreased endurance, decreased mobility, decreased ROM, decreased strength, impaired UE functional use, postural dysfunction, and pain.   ACTIVITY LIMITATIONS: carrying, lifting, reach over head, hygiene/grooming, and caring for others  PARTICIPATION LIMITATIONS: meal prep, cleaning, laundry, interpersonal relationship, driving, community activity, and occupation  PERSONAL FACTORS: Time since onset of injury/illness/exacerbation and 3+ comorbidities: Relevant PMHx includes ADHD, Anxiety, Depression, Dyspnea, Esophageal dysphagia, Esophageal reflux, RA, Tachycardia, L4-L5 fusion (2008) for spondylolistehesis are also affecting patient's functional outcome.   REHAB POTENTIAL: Fair    CLINICAL DECISION MAKING: Evolving/moderate complexity  EVALUATION COMPLEXITY: Moderate   GOALS: Goals reviewed with patient? YES  SHORT TERM GOALS: Target date: 12/21/2023   Patient will be independent with initial home program at least 3 days/week.  Baseline: provided at eval Goal Status: MET 12/20/23: Pt reports adherence   2.  Patient will demonstrate improved postural awareness for at least 15 minutes while seated without need for cueing from PT.  Baseline: see objective measures Goal Status: Ongoing   3.  Patient will demonstrate improved active R lateral flexion and Left rotation by at least 5 degrees.  Baseline: see objective measures Goal status: Ongoing   LONG TERM GOALS: Target date: 01/11/2024   Patient will report improved overall functional ability with NDI score  of 3/50 or less.  Baseline: 8/50 Goal Status: INITIAL    2.  Patient will demonstrate improved UQ strength to at least 4+/5 MMT BIL.  Baseline: see objective measures.  Goal status: INITIAL  3.  Patient will be able to maintain cervical flexion endurance test for at least 25 seconds Baseline: <15 seconds Goal status: INITIAL  4.  Patient will report minimal to no pain with turning her head while driving, nor with work related activities Baseline: moderate pain and difficulty/reported heaviness.  Goal status: INITIAL   PLAN:  PT FREQUENCY: 1-2x/week  PT DURATION: 6 weeks  PLANNED INTERVENTIONS: 97164- PT Re-evaluation, 97750- Physical Performance Testing, 97110-Therapeutic exercises, 97530- Therapeutic activity, [id number]- Neuromuscular re-education, 97535- Self Care, [id number]- Manual therapy, [id number]- Electrical stimulation (unattended), 02987- Traction (mechanical), 20560 (1-2 muscles), 20561 (3+ muscles)- Dry Needling, Patient/Family education, Taping, Joint mobilization, Spinal mobilization, Cryotherapy, and Moist heat  PLAN FOR NEXT SESSION: begin CS mobility  and strengthening program as indicated. Address CS and periscapular mm endurance.    Marko Molt, PT, DPT  12/26/2023 5:45 PM

## 2024-01-03 ENCOUNTER — Other Ambulatory Visit: Payer: Self-pay

## 2024-01-03 ENCOUNTER — Encounter (INDEPENDENT_AMBULATORY_CARE_PROVIDER_SITE_OTHER): Payer: Self-pay | Admitting: Family Medicine

## 2024-01-03 ENCOUNTER — Ambulatory Visit (INDEPENDENT_AMBULATORY_CARE_PROVIDER_SITE_OTHER): Admitting: Family Medicine

## 2024-01-03 ENCOUNTER — Other Ambulatory Visit (HOSPITAL_COMMUNITY): Payer: Self-pay

## 2024-01-03 VITALS — BP 98/66 | HR 81 | Temp 97.9°F | Ht 60.0 in | Wt 138.0 lb

## 2024-01-03 DIAGNOSIS — F39 Unspecified mood [affective] disorder: Secondary | ICD-10-CM | POA: Diagnosis not present

## 2024-01-03 DIAGNOSIS — Z72820 Sleep deprivation: Secondary | ICD-10-CM

## 2024-01-03 DIAGNOSIS — E559 Vitamin D deficiency, unspecified: Secondary | ICD-10-CM

## 2024-01-03 DIAGNOSIS — Z6826 Body mass index (BMI) 26.0-26.9, adult: Secondary | ICD-10-CM | POA: Diagnosis not present

## 2024-01-03 DIAGNOSIS — Z6827 Body mass index (BMI) 27.0-27.9, adult: Secondary | ICD-10-CM

## 2024-01-03 MED ORDER — VITAMIN D (ERGOCALCIFEROL) 1.25 MG (50000 UNIT) PO CAPS
50000.0000 [IU] | ORAL_CAPSULE | ORAL | 0 refills | Status: AC
  Filled 2024-01-03 – 2024-01-08 (×2): qty 6, 84d supply, fill #0

## 2024-01-03 MED ORDER — MELATONIN 1 MG PO CAPS: ORAL_CAPSULE | ORAL | Status: DC

## 2024-01-03 MED ORDER — RINVOQ 15 MG PO TB24: ORAL_TABLET | ORAL | 0 refills | Status: DC

## 2024-01-03 NOTE — Progress Notes (Signed)
 [name], D.O.  ABFM, ABOM Specializing in Clinical Bariatric Medicine  Office located at: [address] [place], [place]  [address]   Assessment and Plan:   Medications Discontinued During This Encounter  Medication Reason   metFORMIN  (GLUCOPHAGE ) 500 MG tablet Completed Course   Dextromethorphan -buPROPion  ER (AUVELITY ) 45-105 MG TBCR    brexpiprazole  (REXULTI ) 1 MG TABS tablet    brexpiprazole  (REXULTI ) 1 MG TABS tablet    Dextromethorphan -buPROPion  ER (AUVELITY ) 45-105 MG TBCR    lisdexamfetamine (VYVANSE ) 40 MG capsule    LORazepam  (ATIVAN ) 1 MG tablet    methylphenidate  (CONCERTA ) 54 MG PO CR tablet    cyclobenzaprine  (FLEXERIL ) 10 MG tablet    metFORMIN  (GLUCOPHAGE ) 500 MG tablet Completed Course   metFORMIN  (GLUCOPHAGE ) 500 MG tablet Completed Course   metFORMIN  (GLUCOPHAGE ) 500 MG tablet Completed Course   Vitamin D , Ergocalciferol , (DRISDOL ) 1.25 MG (50000 UNIT) CAPS capsule Reorder    Meds ordered this encounter  Medications   Melatonin 1 MG CAPS    Sig: Try up to 5mg  Sustained release   Vitamin D , Ergocalciferol , (DRISDOL ) 1.25 MG (50000 UNIT) CAPS capsule    Sig: Take 1 capsule (50,000 Units total) by mouth every 14 (fourteen) days.    Dispense:  6 capsule    Refill:  0    FOR THE DISEASE OF OBESITY:  Morbid obesity (HCC) - start bmi 33.40 date 01/23/20; BMI 27.0-27.9, adult current 26.95 Assessment & Plan: Since last office visit on 11/01/2023 patient's muscle mass has decreased by 1 lbs. Fat mass has decreased by 1.6 lbs. Total body water has decreased by 2.6 lbs.  Body fat % has decreased by 0.6 %. Counseling done on how various foods will affect these numbers and how to maximize success.  Total lbs lost to date: 33 lbs Total weight loss percentage to date: 19.30 %  - Eating More Whole Foods: no  - Adequate Protein Intake: yes  - Adequate Water Intake: yes  - Skipping Meals: no  - Sleeping 7-9 Hours/ Night: no - attributes to the start  of the school year as well as titrating down her mental health medications, recently stopped her Rexulti    Recommended Dietary Goals Neko is currently in the action stage of change. As such, her goal is to continue weight management plan.  She has agreed to: continue current plan   Behavioral Intervention We discussed the following today: increasing lean protein intake to established goals, decreasing simple carbohydrates , increasing water intake , and work on managing stress, creating time for self-care and relaxation  Additional resources provided today: None  Evidence-based interventions for health behavior change were utilized today including the discussion of self monitoring techniques, problem-solving barriers and SMART goal setting techniques.   Regarding patient's less desirable eating habits and patterns, we employed the technique of small changes.   Goal(s) for next OV: continue trying to return back to exercising at the gym with a consistent routine, starting with 1 day/ week and aim for additional activity 2 days/ week.   Recommended Physical Activity Goals Adara has been advised to work up to 300-450 minutes of moderate intensity aerobic activity a week and strengthening exercises 2-3 times per week for cardiovascular health, weight loss maintenance and preservation of muscle mass.   She has agreed to: Think about enjoyable ways to increase daily physical activity and overcoming barriers to exercise and Increase physical activity in their day and reduce sedentary time (increase NEAT).   Pharmacotherapy We  both agreed to: Adequate clinical response to anti-obesity medication, continue current regimen of Wegovy  0.5 mg weekly.    ASSOCIATED CONDITIONS ADDRESSED TODAY:  Poor sleep Assessment & Plan: Currently she takes OTC Melatonin 5 mg IR nightly for this, but admits that it doesn't always promote the best sleep quality for her.   Recommended Melatonin SR and  informed patient to take only 10 mg at most for maximized benefits w/o side effects. Discussed how cold and dark rooms, and avoiding blue light prior to bedtime assists with sleep. Additionally recommended she try the Calm app or listening to [employer].    Mood disorder (HCC) Assessment & Plan: Recently discontinued her Rexulti  1 mg daily, but is still taking Buspar  30 mg BID, Auvelity  45-105 mg BID, and Ativan  1 mg as needed for panic attacks. She is followed by Greig Chime, PMHNP, who she usually sees every 6 months, but was recently seeing her more frequently while medications were being adjusted. Advised that she continue seeing her counselor regularly as well as work on Dispensing optician through meditation, healthy eating, adequate sleep, and consistent exercise. Counseled on correlation found between Ativan  and dementia in recent studies - advised to only take strictly as needed.     Vitamin D  deficiency Assessment & Plan: Reviewed, compared to last year, Vitamin-D has significantly decreased from 70.6 and is now barely out of normal range 50-70. She does take Vitamin-D 50,000 units weekly. Refilling Vitamin-D.  Lab Results  Component Value Date   VD25OH 49.3 08/21/2023   Follow up:   No follow-ups on file.  She was informed of the importance of frequent follow up visits to maximize her success with intensive lifestyle modifications for her multiple health conditions.  Subjective:    Chief complaint: Obesity Narely is here to discuss her progress with her obesity treatment plan. She is on Category 1 MP with option to journal 1,000-1,100 calories per day and 80+ g of protein per day and states she is following her eating plan approximately 50% of the time. Pt is not exercising.- previously she had 3 days off, now only has 1 day; however, she is walking her dogs.   Interval History:  [name] is here for a follow up office visit. Pt has experienced a weight  loss of 2 LBS since last OV on 11/01/2023.   At last OV, we switched her to Cat 1 meal plan last OV and she says that while she doesn't feel much different since switching thought she noticed increased weight loss and night-time cravings, which she satisfies with a handful of cashews or zero-calorie pickles. To reduce night-time cravings I've recommended she eat more frequent, smaller meals q3-5 hours with a focus on increasing protein.   Pharmacotherapy that aid with weight loss: She is currently taking Wegovy  0.5 mg weekly with adequate clinical response and without side effects. She says that she has been feeling good and has been maintaining her weight while taking medication.   Review of Systems:  Pertinent positives were addressed with patient today.  Reviewed by clinician on day of visit: allergies, medications, problem list, medical history, surgical history, family history, social history, and previous encounter notes.  Weight Summary and Biometrics   Weight Lost Since Last Visit: 2lb  Weight Gained Since Last Visit: 0   Vitals Temp: 97.9 F (36.6 C) BP: 98/66 Pulse Rate: 81 SpO2: 98 %   Anthropometric Measurements Height: 5' (1.524 m) Weight: 138 lb (62.6 kg) BMI (Calculated): 26.95 Weight  at Last Visit: 140lb Weight Lost Since Last Visit: 2lb Weight Gained Since Last Visit: 0 Starting Weight: 171lb Total Weight Loss (lbs): 33 lb (15 kg)   Body Composition  Body Fat %: 30.4 % Fat Mass (lbs): 42 lbs Muscle Mass (lbs): 91.2 lbs Total Body Water (lbs): 64.6 lbs Visceral Fat Rating : 5   Other Clinical Data Fasting: yes Labs: no Today's Visit #: 45 Starting Date: 01/23/20    Objective:   PHYSICAL EXAM: Blood pressure 98/66, pulse 81, temperature 97.9 F (36.6 C), height 5' (1.524 m), weight 138 lb (62.6 kg), SpO2 98%. Body mass index is 26.95 kg/m.  General: she is overweight, cooperative and in no acute distress. PSYCH: Has normal mood, affect and  thought process.   HEENT: EOMI, sclerae are anicteric. Lungs: Normal breathing effort, no conversational dyspnea. Extremities: Moves * 4 Neurologic: A and O * 3, good insight  DIAGNOSTIC DATA REVIEWED: BMET    Component Value Date/Time   NA 137 08/30/2023 0837   K 4.8 08/30/2023 0837   CL 100 08/30/2023 0837   CO2 22 08/30/2023 0837   GLUCOSE 86 08/30/2023 0837   GLUCOSE 81 08/22/2023 1340   BUN 9 08/30/2023 0837   CREATININE 0.84 08/30/2023 0837   CREATININE 0.87 12/19/2019 0946   CALCIUM  9.5 08/30/2023 0837   GFRNONAA >60 08/22/2023 1340   GFRAA >60 11/01/2015 1705   Lab Results  Component Value Date   HGBA1C 4.9 08/21/2023   HGBA1C 5.3 12/19/2019   Lab Results  Component Value Date   INSULIN  6.5 08/21/2023   INSULIN  8.5 01/23/2020   Lab Results  Component Value Date   TSH 2.730 08/21/2023   CBC    Component Value Date/Time   WBC 5.7 08/22/2023 0854   RBC 4.06 08/22/2023 0854   HGB 13.5 08/22/2023 0854   HGB 12.6 12/01/2021 1250   HCT 38.4 08/22/2023 0854   HCT 37.4 12/01/2021 1250   PLT 280 08/22/2023 0854   PLT 276 12/01/2021 1250   MCV 94.6 08/22/2023 0854   MCV 94 12/01/2021 1250   MCH 33.3 08/22/2023 0854   MCHC 35.2 08/22/2023 0854   RDW 13.0 08/22/2023 0854   RDW 12.1 12/01/2021 1250   Iron Studies    Component Value Date/Time   FERRITIN 31.7 10/20/2014 1657   Lipid Panel     Component Value Date/Time   CHOL 258 (H) 06/15/2022 0840   TRIG 103 06/15/2022 0840   HDL 141 06/15/2022 0840   CHOLHDL 2.2 12/01/2021 1250   LDLCALC 100 (H) 06/15/2022 0840   Hepatic Function Panel     Component Value Date/Time   PROT 6.4 (L) 08/22/2023 0854   PROT 6.5 06/15/2022 0840   ALBUMIN 4.2 08/22/2023 0854   ALBUMIN 4.3 06/15/2022 0840   AST 41 08/22/2023 0854   ALT 33 08/22/2023 0854   ALKPHOS 48 08/22/2023 0854   BILITOT 0.2 08/22/2023 0854   BILITOT <0.2 06/15/2022 0840   BILIDIR 0.0 12/19/2019 0946   IBILI 0.3 12/19/2019 0946       Component Value Date/Time   TSH 2.730 08/21/2023 0822   Nutritional Lab Results  Component Value Date   VD25OH 49.3 08/21/2023   VD25OH 70.6 11/17/2022   VD25OH 28.8 (L) 06/15/2022    Attestations:   LILLETTE Damien Blanks, acting as a Stage manager for Christine Jenkins, DO., have compiled all relevant documentation for today's office visit on behalf of Christine Jenkins, DO, while in the presence of [employer], DO.  I have spent 30 minutes in the care of the patient today including 26 minutes face-to-face assessing and reviewing listed medical problems above as outlined in office visit note and providing nutritional and behavioral counseling as outlined in obesity care plan.   I have reviewed the above documentation for accuracy and completeness, and I agree with the above. Christine JINNY [name], D.O.  The 21st Century Cures Act was signed into law in 2016 which includes the topic of electronic health records.  This provides immediate access to information in MyChart.  This includes consultation notes, operative notes, office notes, lab results and pathology reports.  If you have any questions about what you read please let us  know at your next visit so we can discuss your concerns and take corrective action if need be.  We are right here with you.

## 2024-01-05 ENCOUNTER — Other Ambulatory Visit: Payer: Self-pay

## 2024-01-08 ENCOUNTER — Other Ambulatory Visit (HOSPITAL_COMMUNITY): Payer: Self-pay

## 2024-01-08 ENCOUNTER — Telehealth: Payer: Self-pay | Admitting: Pharmacist

## 2024-01-08 NOTE — Telephone Encounter (Signed)
 Called patient to schedule an appointment for the [facility]. I was unable to reach the patient so I left a HIPAA-compliant message requesting that the patient return my call.   [name], PharmD, JAQUELINE, CPP Clinical Pharmacist [facility] [phone]

## 2024-01-10 ENCOUNTER — Other Ambulatory Visit: Payer: Self-pay

## 2024-01-10 ENCOUNTER — Ambulatory Visit

## 2024-01-10 ENCOUNTER — Other Ambulatory Visit (HOSPITAL_COMMUNITY): Payer: Self-pay

## 2024-01-10 DIAGNOSIS — F332 Major depressive disorder, recurrent severe without psychotic features: Secondary | ICD-10-CM | POA: Diagnosis not present

## 2024-01-10 DIAGNOSIS — R293 Abnormal posture: Secondary | ICD-10-CM

## 2024-01-10 DIAGNOSIS — M542 Cervicalgia: Secondary | ICD-10-CM | POA: Diagnosis not present

## 2024-01-10 DIAGNOSIS — F4310 Post-traumatic stress disorder, unspecified: Secondary | ICD-10-CM | POA: Diagnosis not present

## 2024-01-10 MED ORDER — LISDEXAMFETAMINE DIMESYLATE 50 MG PO CAPS
50.0000 mg | ORAL_CAPSULE | Freq: Every morning | ORAL | 0 refills | Status: DC
  Filled 2024-01-17: qty 30, 30d supply, fill #0

## 2024-01-10 MED ORDER — BUSPIRONE HCL 30 MG PO TABS
ORAL_TABLET | ORAL | 0 refills | Status: AC
  Filled 2024-01-10: qty 45, 30d supply, fill #0

## 2024-01-10 MED ORDER — AUVELITY 45-105 MG PO TBCR
1.0000 | EXTENDED_RELEASE_TABLET | Freq: Two times a day (BID) | ORAL | 0 refills | Status: DC
  Filled 2024-01-10: qty 180, 90d supply, fill #0

## 2024-01-10 NOTE — Therapy (Addendum)
 OUTPATIENT PHYSICAL THERAPY TREATMENT NOTE   Patient Name: [name] MRN: [id number] DOB:Aug 31, 1985, 38 y.o., female Today's Date: 01/10/2024  END OF SESSION:  PT End of Session - 01/10/24 1004     Visit Number 5    Number of Visits 12    Date for PT Re-Evaluation 01/11/24    Authorization Type [facility] [employer]    PT Start Time 1000    PT Stop Time 1045    PT Time Calculation (min) 45 min    Activity Tolerance Patient tolerated treatment well    Behavior During Therapy [facility] for tasks assessed/performed           Past Medical History:  Diagnosis Date   Abdominal pain 12/11/2010   Qualifier: Diagnosis of   By: Charlott MD, Reyes SCULL SNOMED Dx Update Oct 2024     Abnormal Pap smear of cervix    ADHD 09/30/2016   Admitted with dehydration 06/03/2014   Anxiety and depression 11/17/2010   she sees Amy Idell Chime in [place]   At risk for deficient intake of food 03/11/2020   At risk for dehydration 05/06/2020   At risk for diabetes mellitus 02/27/2020   At risk for impaired metabolic function 04/13/2020   At risk for side effect of medication 06/25/2020   BMI 28.0-28.9,adult 09/08/2022   Brachydactyly of fingers    Class 1 obesity with serious comorbidity and body mass index (BMI) of 33.0 to 33.9 in adult 07/13/2020   Constipation, slow transit 11/17/2010   Depression    Dyspnea 02/23/2015   Followed in Pulmonary clinic/ [facility]/ [place]   - 02/23/2015  Walked RA x 3 laps @ 185 ft each stopped due to End of study,  Brisk pace, mild  sob  sats 93%     - D dimer   02/23/15 Pos 0.58 > CTa neg for PE or ILD   03/31/15 >FEV1 105%, ratio 80 and FVC 114%.   - CPST  04/09/15>  There is no evidence of exercise-induced bronchospasm or ventilatory limitations. Low OUES and O2 pulse combi   Esophageal dysphagia 11/17/2010   Esophageal reflux 11/17/2010   Family history of cleft lip 11/14/2011   GAD (generalized anxiety disorder) 03/11/2014   Gastroenteritis  06/02/2014   Generalized anxiety disorder 09/20/2011   GERD (gastroesophageal reflux disease)    History of vitamin D  deficiency 06/15/2022   HPV (human papilloma virus) infection    Insulin  resistance 03/23/2020   Major depressive disorder, recurrent episode, moderate (HCC) 03/11/2014   Mood disorder (HCC),with emotional eating 03/23/2020   NAUSEA AND VOMITING 12/11/2010   Qualifier: Diagnosis of   By: Charlott MD, Ninfa Giannelli         Other hyperlipidemia 06/03/2020   Polyphagia 10/07/2021   Rheumatoid arthritis (HCC) 09/13/2013   Right knee pain 09/06/2016   Spondylosis, lumbosacral 11/17/2010   Tachycardia 01/22/2016   Tracheomalacia, congenital    Vitamin D  deficiency 02/27/2020   Past Surgical History:  Procedure Laterality Date   BACK SURGERY     HAND SURGERY  1996   to elongate the right 3rd metacarpal    LUMBAR FUSION  2008   per Dr. Carles on L4-5 for spondylolisthesis    NASAL SEPTUM SURGERY  01/01/2018   SHOULDER ARTHROSCOPY     Patient Active Problem List   Diagnosis Date Noted   Poor sleep 01/03/2024   Lung nodules 09/28/2023   DOE (dyspnea on exertion) 08/30/2023   Hypokalemia 08/30/2023  Abnormal Pap smear of cervix    Anxiety    Brachydactyly of fingers    GERD (gastroesophageal reflux disease)    HPV (human papilloma virus) infection    Pregnancy    Tracheomalacia, congenital    BMI 28.0-28.9,adult 09/08/2022   History of vitamin D  deficiency 06/15/2022   Polyphagia 10/07/2021   Class 1 obesity with serious comorbidity and body mass index (BMI) of 33.0 to 33.9 in adult 07/13/2020   At risk for side effect of medication 06/25/2020   Other hyperlipidemia 06/03/2020   At risk for dehydration 05/06/2020   At risk for impaired metabolic function 04/13/2020   Mood disorder (HCC),with emotional eating 03/23/2020   Insulin  resistance 03/23/2020   At risk for deficient intake of food 03/11/2020   Vitamin D  deficiency 02/27/2020   At risk for diabetes  mellitus 02/27/2020   Depression 02/27/2020   ADHD 09/30/2016   Right knee pain 09/06/2016   Tachycardia 01/22/2016   Dyspnea 02/23/2015   Threatened preterm labor 07/05/2014   Preterm contractions 07/05/2014   Vacuum extraction, delivered, current hospitalization 07/05/2014   Preterm labor 07/03/2014   Admitted with dehydration 06/03/2014   Gastroenteritis 06/02/2014   Major depressive disorder, recurrent episode, moderate (HCC) 03/11/2014   GAD (generalized anxiety disorder) 03/11/2014   Rheumatoid arthritis (HCC) 09/13/2013   Depressive disorder, not elsewhere classified 07/04/2012   Family history of cleft lip 11/14/2011   Generalized anxiety disorder 09/20/2011   NAUSEA AND VOMITING 12/11/2010   Abdominal pain 12/11/2010   Esophageal reflux 11/17/2010   Constipation, slow transit 11/17/2010   Spondylosis, lumbosacral 11/17/2010   Anxiety and depression 11/17/2010   Esophageal dysphagia 11/17/2010   Anxiety state 09/04/2010    PCP: Johnny Garnette LABOR, MD  REFERRING PROVIDER: Debby Dorn MATSU, MD  REFERRING DIAG: Cervical radiculopathy [F45.87]   THERAPY DIAG:  Cervicalgia  Abnormal posture  Rationale for Evaluation and Treatment: Rehabilitation  ONSET DATE: 11/22/2023 date of referral   SUBJECTIVE:                                                                                                                                                                                                         SUBJECTIVE STATEMENT:  Overall symptoms improving.  Feels stretching has been most beneficial.   Patient reports that she received MRI results, stating it said that everything was good, and they said we can continue with PT without concern.    Hand dominance: Right  PERTINENT HISTORY:  Relevant PMHx includes ADHD, Anxiety, Depression, Dyspnea, Esophageal dysphagia, Esophageal reflux, RA, Tachycardia, L4-L5  fusion (2008) for spondylolistehesis   PAIN:  Are you having  pain? Yes: NPRS scale: 4-5/10 current, 7/10 at worst  Pain location: mid cervical spine  Pain description: aching, burning, heavy, headache  Aggravating factors: turning head  Relieving factors: laying down (temporarily)    PRECAUTIONS: None  RED FLAGS: None     WEIGHT BEARING RESTRICTIONS: No  FALLS:  Has patient fallen in last 6 months? No  LIVING ENVIRONMENT: Lives with: lives with their family and lives with their spouse Stairs: Yes with railing present   OCCUPATION: Nurse   PLOF: Independent  PATIENT GOALS: to have less pain in neck with daily activities   NEXT MD VISIT: 01/03/2024  OBJECTIVE:  Note: Objective measures were completed at Evaluation unless otherwise noted.   PATIENT SURVEYS:  NDI:  NECK DISABILITY INDEX  Date: 11/30/2023 Score  Pain intensity 2 = The pain is moderate at the moment  2. Personal care (washing, dressing, etc.) 0 = I can look after myself normally without causing extra pain  3. Lifting 0 =  I can lift heavy weights without extra pain  4. Reading 2 =  I can read as much as I want with moderate pain in my neck  5. Headaches 1 =  I have slight headaches, which come infrequently  6. Concentration 0 =  I can concentrate fully when I want to with no difficulty  7. Work 0 =  I can do as much work as I want to  8. Driving 1 =  I can drive my car as long as I want with slight pain in my neck  9. Sleeping 1 = My sleep is slightly disturbed (less than 1 hr sleepless)  10. Recreation 1 =  I am able to engage in all my recreation activities, with some pain in my neck  Total 8/50   Minimum Detectable Change (90% confidence): 5 points or 10% points  COGNITION: Overall cognitive status: Within functional limits for tasks assessed  SENSATION: Not tested  POSTURE: forward head   Cervical MMT: 4/5 MMT in all directions   Pain with all directions, in same place as primary symptoms   CERVICAL ROM:   Active ROM A/PROM (deg) eval  Flexion 45   Extension 40  Right lateral flexion 30  Left lateral flexion 40  Right rotation 64  Left rotation 45   (Blank rows = not tested)  UPPER EXTREMITY ROM: WFL BIL   Active ROM Right eval Left eval  Shoulder flexion 4 4-  Shoulder extension    Shoulder abduction 4+ 4  Shoulder adduction    Shoulder extension    Shoulder internal rotation 4+ 4+  Shoulder external rotation 4 4  Elbow flexion 4+ 4+  Elbow extension 5 5  Wrist flexion    Wrist extension    Wrist ulnar deviation    Wrist radial deviation    Wrist pronation    Wrist supination     (Blank rows = not tested)  UPPER EXTREMITY MMT:  MMT Right eval Left eval  Shoulder flexion    Shoulder extension    Shoulder abduction    Shoulder adduction    Shoulder extension    Shoulder internal rotation    Shoulder external rotation    Middle trapezius    Lower trapezius    Elbow flexion    Elbow extension    Wrist flexion    Wrist extension    Wrist ulnar deviation    Wrist radial deviation  Wrist pronation    Wrist supination    Grip strength     (Blank rows = not tested)  CERVICAL SPECIAL TESTS:  Cranial cervical flexion test: Positive and Neck flexor muscle endurance test: Positive   TREATMENT DATE:  [facility] Adult PT Treatment:                                                DATE: 01/10/24 Therapeutic Exercise: UBE L2 4/4 min Seated hor abd RTB 15x Seated ER RTB 15x Chin tucks over 1/2 roll with B rotation 10/10 Manual Therapy: B UT stretch 30x 2 B levator stretch 30s x2 B scalene stretch 30s x3 Upper cervical distraction stretch 30s x2 R first rib mobilization with R shoulder flexion  [facility] Adult PT Treatment:                                                DATE: 12/26/2023  Therapeutic Exercise: Nustep L5 6 min Chin tuck x15  Therapeutic Activity: Rows GTB 2x10 - cues for posture Shoulder extension GTB 2x10 - cues for posture Seated BIL ER with scap retraction RTB 2x10 Supine diagonals RTB x10  BIL Supine hor abd RTB 2x10 CS isometric 3 sec x 10 in all directions  CS self-SNAG x 10     [facility] Adult PT Treatment:                                                DATE: 12/20/23 Therapeutic Exercise: Nustep L5 8 min Chin tuck x15 Therapeutic Activity: Rows GTB 2x10 - cues for posture Shoulder GTB 2x10 - cues for posture Seated BIL ER with scap retraction RTB 2x10 OH flexion with stick x15 emphasizing breathing patterns Supine OH flexion alternating UES 1# 10/10 Supine diagonals RTB x10 BIL Supine hor abd RTB 2x10 Self Care: Theracane self instruction MTPR and tack and stretch  [facility] Adult PT Treatment:                                                DATE: 12/06/23 Therapeutic Exercise: Nustep L2 8 min Chin tuck 10x Manual Therapy: Manual L scalene stretch 30s x3 Manual L levator stretch 30s x2 SO release 5 min Therapeutic Activity: OH flexion with stick 10x emphasizing breathing patterns Supine OH flexion alternating UES 1# 15/15 Supine hor abd YTB 15x   [facility] Adult PT Treatment:                                                DATE: 11/30/2023   Initial evaluation: see patient education and home exercise program as noted below  PATIENT EDUCATION:  Education details: reviewed initial home exercise program; discussion of POC, prognosis and goals for skilled PT   Person educated: Patient Education method: Explanation, Demonstration, and Handouts Education comprehension: verbalized understanding, returned demonstration, and needs further education  HOME EXERCISE PROGRAM: Access Code: [email or web address] URL: [email or web address] Date: 12/06/2023 Prepared by: Marko Molt  Exercises - Supine Cervical Retraction with Towel  - 2 x daily - 7 x weekly - 2 sets - 10 reps - 3 sec hold - Seated Scapular Retraction  - 2 x daily - 7 x weekly - 2 sets  - 10 reps - 3 sec hold - Prone W Scapular Retraction  - 2 x daily - 7 x weekly - 2 sets - 10 reps  ASSESSMENT:  CLINICAL IMPRESSION: Focus of today's session was stretching to cervical and posterior shoulder region B and R first rib mobilizations with MWMs.  Excellent response to manual techniques as cervical rotation increased to ~90% at end of session.   EVAL: Dannon is a 38 y.o. female who was seen today for physical therapy evaluation and treatment for Neck pain with mobility deficits. She is demonstrating decreased CS MMT, decreased UE MMT, and decreased CS and postural mm endurance. She has related pain and difficulty with turning her head, holding her head up throughout the day, and holding head down for reading tasks. She requires skilled PT services at this time to address relevant deficits and improve overall function.     OBJECTIVE IMPAIRMENTS: decreased activity tolerance, decreased endurance, decreased mobility, decreased ROM, decreased strength, impaired UE functional use, postural dysfunction, and pain.   ACTIVITY LIMITATIONS: carrying, lifting, reach over head, hygiene/grooming, and caring for others  PARTICIPATION LIMITATIONS: meal prep, cleaning, laundry, interpersonal relationship, driving, community activity, and occupation  PERSONAL FACTORS: Time since onset of injury/illness/exacerbation and 3+ comorbidities: Relevant PMHx includes ADHD, Anxiety, Depression, Dyspnea, Esophageal dysphagia, Esophageal reflux, RA, Tachycardia, L4-L5 fusion (2008) for spondylolistehesis are also affecting patient's functional outcome.   REHAB POTENTIAL: Fair    CLINICAL DECISION MAKING: Evolving/moderate complexity  EVALUATION COMPLEXITY: Moderate   GOALS: Goals reviewed with patient? YES  SHORT TERM GOALS: Target date: 12/21/2023   Patient will be independent with initial home program at least 3 days/week.  Baseline: provided at eval Goal Status: MET 12/20/23: Pt reports  adherence   2.  Patient will demonstrate improved postural awareness for at least 15 minutes while seated without need for cueing from PT.  Baseline: see objective measures Goal Status: Ongoing   3.  Patient will demonstrate improved active R lateral flexion and Left rotation by at least 5 degrees.  Baseline: see objective measures Goal status: Ongoing   LONG TERM GOALS: Target date: 01/11/2024   Patient will report improved overall functional ability with NDI score of 3/50 or less.  Baseline: 8/50 Goal Status: INITIAL    2.  Patient will demonstrate improved UQ strength to at least 4+/5 MMT BIL.  Baseline: see objective measures.  Goal status: INITIAL  3.  Patient will be able to maintain cervical flexion endurance test for at least 25 seconds Baseline: <15 seconds Goal status: INITIAL  4.  Patient will report minimal to no pain with turning her head while driving, nor with work related activities Baseline: moderate pain and difficulty/reported heaviness.  Goal status: INITIAL   PLAN:  PT FREQUENCY: 1-2x/week  PT DURATION: 6 weeks  PLANNED INTERVENTIONS: [id number]- PT Re-evaluation, 97750- Physical Performance Testing, 97110-Therapeutic exercises, 97530- Therapeutic activity, [id number]- Neuromuscular re-education, 97535- Self Care, [id number]- Manual  therapy, [id number]- Electrical stimulation (unattended), [phone]- Traction (mechanical), [id number] (1-2 muscles), 20561 (3+ muscles)- Dry Needling, Patient/Family education, Taping, Joint mobilization, Spinal mobilization, Cryotherapy, and Moist heat  PLAN FOR NEXT SESSION: begin CS mobility and strengthening program as indicated. Address CS and periscapular mm endurance.    Jeff Oziah Vitanza PT  01/10/2024 11:12 AM

## 2024-01-11 ENCOUNTER — Other Ambulatory Visit: Payer: Self-pay

## 2024-01-17 ENCOUNTER — Other Ambulatory Visit (HOSPITAL_COMMUNITY): Payer: Self-pay

## 2024-01-17 NOTE — Therapy (Unsigned)
 OUTPATIENT PHYSICAL THERAPY TREATMENT NOTE   Patient Name: [name] MRN: [id number] DOB:02/10/1986, 38 y.o., female Today's Date: 01/19/2024  END OF SESSION:  PT End of Session - 01/19/24 1217     Visit Number 6    Number of Visits 12    Date for Recertification  03/20/24    Authorization Type [facility] [employer]    PT Start Time 1215    PT Stop Time 1255    PT Time Calculation (min) 40 min    Activity Tolerance Patient tolerated treatment well    Behavior During Therapy [facility] for tasks assessed/performed            Past Medical History:  Diagnosis Date   Abdominal pain 12/11/2010   Qualifier: Diagnosis of   By: Charlott MD, Reyes SCULL SNOMED Dx Update Oct 2024     Abnormal Pap smear of cervix    ADHD 09/30/2016   Admitted with dehydration 06/03/2014   Anxiety and depression 11/17/2010   she sees Amy Idell Chime in [place]   At risk for deficient intake of food 03/11/2020   At risk for dehydration 05/06/2020   At risk for diabetes mellitus 02/27/2020   At risk for impaired metabolic function 04/13/2020   At risk for side effect of medication 06/25/2020   BMI 28.0-28.9,adult 09/08/2022   Brachydactyly of fingers    Class 1 obesity with serious comorbidity and body mass index (BMI) of 33.0 to 33.9 in adult 07/13/2020   Constipation, slow transit 11/17/2010   Depression    Dyspnea 02/23/2015   Followed in Pulmonary clinic/ [facility]/ [place]   - 02/23/2015  Walked RA x 3 laps @ 185 ft each stopped due to End of study,  Brisk pace, mild  sob  sats 93%     - D dimer   02/23/15 Pos 0.58 > CTa neg for PE or ILD   03/31/15 >FEV1 105%, ratio 80 and FVC 114%.   - CPST  04/09/15>  There is no evidence of exercise-induced bronchospasm or ventilatory limitations. Low OUES and O2 pulse combi   Esophageal dysphagia 11/17/2010   Esophageal reflux 11/17/2010   Family history of cleft lip 11/14/2011   GAD (generalized anxiety disorder) 03/11/2014   Gastroenteritis  06/02/2014   Generalized anxiety disorder 09/20/2011   GERD (gastroesophageal reflux disease)    History of vitamin D  deficiency 06/15/2022   HPV (human papilloma virus) infection    Insulin  resistance 03/23/2020   Major depressive disorder, recurrent episode, moderate (HCC) 03/11/2014   Mood disorder (HCC),with emotional eating 03/23/2020   NAUSEA AND VOMITING 12/11/2010   Qualifier: Diagnosis of   By: Charlott MD, Adela Esteban         Other hyperlipidemia 06/03/2020   Polyphagia 10/07/2021   Rheumatoid arthritis (HCC) 09/13/2013   Right knee pain 09/06/2016   Spondylosis, lumbosacral 11/17/2010   Tachycardia 01/22/2016   Tracheomalacia, congenital    Vitamin D  deficiency 02/27/2020   Past Surgical History:  Procedure Laterality Date   BACK SURGERY     HAND SURGERY  1996   to elongate the right 3rd metacarpal    LUMBAR FUSION  2008   per Dr. Carles on L4-5 for spondylolisthesis    NASAL SEPTUM SURGERY  01/01/2018   SHOULDER ARTHROSCOPY     Patient Active Problem List   Diagnosis Date Noted   Poor sleep 01/03/2024   Lung nodules 09/28/2023   DOE (dyspnea on exertion) 08/30/2023   Hypokalemia 08/30/2023  Abnormal Pap smear of cervix    Anxiety    Brachydactyly of fingers    GERD (gastroesophageal reflux disease)    HPV (human papilloma virus) infection    Pregnancy    Tracheomalacia, congenital    BMI 28.0-28.9,adult 09/08/2022   History of vitamin D  deficiency 06/15/2022   Polyphagia 10/07/2021   Class 1 obesity with serious comorbidity and body mass index (BMI) of 33.0 to 33.9 in adult 07/13/2020   At risk for side effect of medication 06/25/2020   Other hyperlipidemia 06/03/2020   At risk for dehydration 05/06/2020   At risk for impaired metabolic function 04/13/2020   Mood disorder (HCC),with emotional eating 03/23/2020   Insulin  resistance 03/23/2020   At risk for deficient intake of food 03/11/2020   Vitamin D  deficiency 02/27/2020   At risk for diabetes  mellitus 02/27/2020   Depression 02/27/2020   ADHD 09/30/2016   Right knee pain 09/06/2016   Tachycardia 01/22/2016   Dyspnea 02/23/2015   Threatened preterm labor 07/05/2014   Preterm contractions 07/05/2014   Vacuum extraction, delivered, current hospitalization 07/05/2014   Preterm labor 07/03/2014   Admitted with dehydration 06/03/2014   Gastroenteritis 06/02/2014   Major depressive disorder, recurrent episode, moderate (HCC) 03/11/2014   GAD (generalized anxiety disorder) 03/11/2014   Rheumatoid arthritis (HCC) 09/13/2013   Depressive disorder, not elsewhere classified 07/04/2012   Family history of cleft lip 11/14/2011   Generalized anxiety disorder 09/20/2011   NAUSEA AND VOMITING 12/11/2010   Abdominal pain 12/11/2010   Esophageal reflux 11/17/2010   Constipation, slow transit 11/17/2010   Spondylosis, lumbosacral 11/17/2010   Anxiety and depression 11/17/2010   Esophageal dysphagia 11/17/2010   Anxiety state 09/04/2010    PCP: Johnny Garnette LABOR, MD  REFERRING PROVIDER: Debby Dorn MATSU, MD  REFERRING DIAG: Cervical radiculopathy [M54.12]   THERAPY DIAG:  Cervicalgia  Abnormal posture  Rationale for Evaluation and Treatment: Rehabilitation  ONSET DATE: 11/22/2023 date of referral   SUBJECTIVE:                                                                                                                                                                                                         SUBJECTIVE STATEMENT:  Stretching was helpfiul last session, still restricted in rotation   Patient reports that she received MRI results, stating it said that everything was good, and they said we can continue with PT without concern.    Hand dominance: Right  PERTINENT HISTORY:  Relevant PMHx includes ADHD, Anxiety, Depression, Dyspnea, Esophageal dysphagia, Esophageal reflux, RA, Tachycardia, L4-L5 fusion (  2008) for spondylolistehesis   PAIN:  Are you having  pain? Yes: NPRS scale: 4-5/10 current, 7/10 at worst  Pain location: mid cervical spine  Pain description: aching, burning, heavy, headache  Aggravating factors: turning head  Relieving factors: laying down (temporarily)    PRECAUTIONS: None  RED FLAGS: None     WEIGHT BEARING RESTRICTIONS: No  FALLS:  Has patient fallen in last 6 months? No  LIVING ENVIRONMENT: Lives with: lives with their family and lives with their spouse Stairs: Yes with railing present   OCCUPATION: Nurse   PLOF: Independent  PATIENT GOALS: to have less pain in neck with daily activities   NEXT MD VISIT: 01/03/2024  OBJECTIVE:  Note: Objective measures were completed at Evaluation unless otherwise noted.   PATIENT SURVEYS:  NDI:  NECK DISABILITY INDEX  Date: 11/30/2023 Score  Pain intensity 2 = The pain is moderate at the moment  2. Personal care (washing, dressing, etc.) 0 = I can look after myself normally without causing extra pain  3. Lifting 0 =  I can lift heavy weights without extra pain  4. Reading 2 =  I can read as much as I want with moderate pain in my neck  5. Headaches 1 =  I have slight headaches, which come infrequently  6. Concentration 0 =  I can concentrate fully when I want to with no difficulty  7. Work 0 =  I can do as much work as I want to  8. Driving 1 =  I can drive my car as long as I want with slight pain in my neck  9. Sleeping 1 = My sleep is slightly disturbed (less than 1 hr sleepless)  10. Recreation 1 =  I am able to engage in all my recreation activities, with some pain in my neck  Total 8/50   Minimum Detectable Change (90% confidence): 5 points or 10% points  COGNITION: Overall cognitive status: Within functional limits for tasks assessed  SENSATION: Not tested  POSTURE: forward head   Cervical MMT: 4/5 MMT in all directions   Pain with all directions, in same place as primary symptoms   CERVICAL ROM:   Active ROM A/PROM (deg) eval  Flexion 45   Extension 40  Right lateral flexion 30  Left lateral flexion 40  Right rotation 64  Left rotation 45   (Blank rows = not tested)  UPPER EXTREMITY ROM: WFL BIL   Active ROM Right eval Left eval  Shoulder flexion 4 4-  Shoulder extension    Shoulder abduction 4+ 4  Shoulder adduction    Shoulder extension    Shoulder internal rotation 4+ 4+  Shoulder external rotation 4 4  Elbow flexion 4+ 4+  Elbow extension 5 5  Wrist flexion    Wrist extension    Wrist ulnar deviation    Wrist radial deviation    Wrist pronation    Wrist supination     (Blank rows = not tested)  UPPER EXTREMITY MMT:  MMT Right eval Left eval  Shoulder flexion    Shoulder extension    Shoulder abduction    Shoulder adduction    Shoulder extension    Shoulder internal rotation    Shoulder external rotation    Middle trapezius    Lower trapezius    Elbow flexion    Elbow extension    Wrist flexion    Wrist extension    Wrist ulnar deviation    Wrist radial deviation  Wrist pronation    Wrist supination    Grip strength     (Blank rows = not tested)  CERVICAL SPECIAL TESTS:  Cranial cervical flexion test: Positive and Neck flexor muscle endurance test: Positive   TREATMENT DATE:  [facility] Adult PT Treatment:                                                DATE: 01/19/24 Therapeutic Exercise: UBE L2 4/4 min HEP review and update Manual Therapy: Cervical ROM into L rotation from flexed position, PA NAGs at mid cervical region into flexion and L rotation  [facility] Adult PT Treatment:                                                DATE: 01/10/24 Therapeutic Exercise: UBE L2 4/4 min Seated hor abd RTB 15x Seated ER RTB 15x Chin tucks over 1/2 roll with B rotation 10/10 Manual Therapy: B UT stretch 30x 2 B levator stretch 30s x2 B scalene stretch 30s x3 Upper cervical distraction stretch 30s x2 R first rib mobilization with R shoulder flexion  [facility] Adult PT Treatment:                                                 DATE: 12/26/2023  Therapeutic Exercise: Nustep L5 6 min Chin tuck x15  Therapeutic Activity: Rows GTB 2x10 - cues for posture Shoulder extension GTB 2x10 - cues for posture Seated BIL ER with scap retraction RTB 2x10 Supine diagonals RTB x10 BIL Supine hor abd RTB 2x10 CS isometric 3 sec x 10 in all directions  CS self-SNAG x 10     [facility] Adult PT Treatment:                                                DATE: 12/20/23 Therapeutic Exercise: Nustep L5 8 min Chin tuck x15 Therapeutic Activity: Rows GTB 2x10 - cues for posture Shoulder GTB 2x10 - cues for posture Seated BIL ER with scap retraction RTB 2x10 OH flexion with stick x15 emphasizing breathing patterns Supine OH flexion alternating UES 1# 10/10 Supine diagonals RTB x10 BIL Supine hor abd RTB 2x10 Self Care: Theracane self instruction MTPR and tack and stretch  [facility] Adult PT Treatment:                                                DATE: 12/06/23 Therapeutic Exercise: Nustep L2 8 min Chin tuck 10x Manual Therapy: Manual L scalene stretch 30s x3 Manual L levator stretch 30s x2 SO release 5 min Therapeutic Activity: OH flexion with stick 10x emphasizing breathing patterns Supine OH flexion alternating UES 1# 15/15 Supine hor abd YTB 15x   [facility] Adult PT Treatment:  DATE: 11/30/2023   Initial evaluation: see patient education and home exercise program as noted below                                                                                                                                 PATIENT EDUCATION:  Education details: reviewed initial home exercise program; discussion of POC, prognosis and goals for skilled PT   Person educated: Patient Education method: Explanation, Demonstration, and Handouts Education comprehension: verbalized understanding, returned demonstration, and needs further education  HOME EXERCISE PROGRAM: Access  Code: [email or web address] URL: [email or web address] Date: 01/19/2024 Prepared by: Reyes Kohut  Exercises - Prone W Scapular Retraction  - 2 x daily - 7 x weekly - 2 sets - 10 reps - Supine Shoulder Horizontal Abduction with Resistance  - 1 x daily - 7 x weekly - 3 sets - 10 reps - Seated Assisted Cervical Rotation with Towel  - 1 x daily - 7 x weekly - 2 sets - 10 reps - Seated Cervical Flexion Stretch with Finger Support Behind Neck  - 2 x daily - 5 x weekly - 1 sets - 30s hold - Supine Deep Neck Flexor Training - Repetitions  - 2 x daily - 5 x weekly - 2 sets - 10 reps  ASSESSMENT:  CLINICAL IMPRESSION: Continued manual stertch and STM to para cervicals.  Equal rotation at end of session.  Discussed TPDN and patient will consider.  HEP reviewed and updated.  EVAL: Zahara is a 38 y.o. female who was seen today for physical therapy evaluation and treatment for Neck pain with mobility deficits. She is demonstrating decreased CS MMT, decreased UE MMT, and decreased CS and postural mm endurance. She has related pain and difficulty with turning her head, holding her head up throughout the day, and holding head down for reading tasks. She requires skilled PT services at this time to address relevant deficits and improve overall function.     OBJECTIVE IMPAIRMENTS: decreased activity tolerance, decreased endurance, decreased mobility, decreased ROM, decreased strength, impaired UE functional use, postural dysfunction, and pain.   ACTIVITY LIMITATIONS: carrying, lifting, reach over head, hygiene/grooming, and caring for others  PARTICIPATION LIMITATIONS: meal prep, cleaning, laundry, interpersonal relationship, driving, community activity, and occupation  PERSONAL FACTORS: Time since onset of injury/illness/exacerbation and 3+ comorbidities: Relevant PMHx includes ADHD, Anxiety, Depression, Dyspnea, Esophageal dysphagia, Esophageal reflux, RA, Tachycardia, L4-L5 fusion (2008) for  spondylolistehesis are also affecting patient's functional outcome.   REHAB POTENTIAL: Fair    CLINICAL DECISION MAKING: Evolving/moderate complexity  EVALUATION COMPLEXITY: Moderate   GOALS: Goals reviewed with patient? YES  SHORT TERM GOALS: Target date: 12/21/2023   Patient will be independent with initial home program at least 3 days/week.  Baseline: provided at eval Goal Status: MET 12/20/23: Pt reports adherence   2.  Patient will demonstrate improved postural awareness for at least 15 minutes while seated without need for cueing from PT.  Baseline: see objective  measures Goal Status: Ongoing   3.  Patient will demonstrate improved active R lateral flexion and Left rotation by at least 5 degrees.  Baseline: see objective measures Goal status: Ongoing   LONG TERM GOALS: Target date: 01/11/2024   Patient will report improved overall functional ability with NDI score of 3/50 or less.  Baseline: 8/50 Goal Status: INITIAL    2.  Patient will demonstrate improved UQ strength to at least 4+/5 MMT BIL.  Baseline: see objective measures.  Goal status: INITIAL  3.  Patient will be able to maintain cervical flexion endurance test for at least 25 seconds Baseline: <15 seconds Goal status: INITIAL  4.  Patient will report minimal to no pain with turning her head while driving, nor with work related activities Baseline: moderate pain and difficulty/reported heaviness.  Goal status: INITIAL   PLAN:  PT FREQUENCY: 1-2x/week  PT DURATION: 6 weeks  PLANNED INTERVENTIONS: 02835- PT Re-evaluation, 97750- Physical Performance Testing, 97110-Therapeutic exercises, 97530- Therapeutic activity, [id number]- Neuromuscular re-education, 97535- Self Care, 02859- Manual therapy, [id number]- Electrical stimulation (unattended), 02987- Traction (mechanical), 20560 (1-2 muscles), 20561 (3+ muscles)- Dry Needling, Patient/Family education, Taping, Joint mobilization, Spinal mobilization, Cryotherapy,  and Moist heat  PLAN FOR NEXT SESSION: begin CS mobility and strengthening program as indicated. Address CS and periscapular mm endurance.   Jeff Feras Gardella PT  01/19/2024 1:03 PM

## 2024-01-18 ENCOUNTER — Other Ambulatory Visit: Payer: Self-pay

## 2024-01-19 ENCOUNTER — Ambulatory Visit

## 2024-01-19 DIAGNOSIS — R293 Abnormal posture: Secondary | ICD-10-CM | POA: Diagnosis not present

## 2024-01-19 DIAGNOSIS — M542 Cervicalgia: Secondary | ICD-10-CM

## 2024-01-19 DIAGNOSIS — F900 Attention-deficit hyperactivity disorder, predominantly inattentive type: Secondary | ICD-10-CM | POA: Diagnosis not present

## 2024-01-19 DIAGNOSIS — F411 Generalized anxiety disorder: Secondary | ICD-10-CM | POA: Diagnosis not present

## 2024-01-19 DIAGNOSIS — F4310 Post-traumatic stress disorder, unspecified: Secondary | ICD-10-CM | POA: Diagnosis not present

## 2024-01-19 NOTE — Addendum Note (Signed)
 Addended by: Melyssa Signor M on: 01/19/2024 12:24 PM   Modules accepted: Orders

## 2024-01-24 ENCOUNTER — Ambulatory Visit: Admitting: Neurology

## 2024-01-24 ENCOUNTER — Encounter: Payer: Self-pay | Admitting: Neurology

## 2024-01-24 ENCOUNTER — Ambulatory Visit: Attending: Family Medicine

## 2024-01-24 ENCOUNTER — Telehealth: Payer: Self-pay | Admitting: Neurology

## 2024-01-24 VITALS — BP 120/83 | HR 80 | Ht 60.0 in | Wt 138.0 lb

## 2024-01-24 DIAGNOSIS — R351 Nocturia: Secondary | ICD-10-CM | POA: Diagnosis not present

## 2024-01-24 DIAGNOSIS — R293 Abnormal posture: Secondary | ICD-10-CM | POA: Diagnosis present

## 2024-01-24 DIAGNOSIS — M542 Cervicalgia: Secondary | ICD-10-CM | POA: Diagnosis present

## 2024-01-24 DIAGNOSIS — G479 Sleep disorder, unspecified: Secondary | ICD-10-CM

## 2024-01-24 DIAGNOSIS — R413 Other amnesia: Secondary | ICD-10-CM | POA: Diagnosis not present

## 2024-01-24 DIAGNOSIS — Z8659 Personal history of other mental and behavioral disorders: Secondary | ICD-10-CM | POA: Diagnosis not present

## 2024-01-24 DIAGNOSIS — Z818 Family history of other mental and behavioral disorders: Secondary | ICD-10-CM | POA: Diagnosis not present

## 2024-01-24 DIAGNOSIS — R419 Unspecified symptoms and signs involving cognitive functions and awareness: Secondary | ICD-10-CM | POA: Diagnosis not present

## 2024-01-24 DIAGNOSIS — Z9189 Other specified personal risk factors, not elsewhere classified: Secondary | ICD-10-CM | POA: Diagnosis not present

## 2024-01-24 NOTE — Therapy (Incomplete)
 OUTPATIENT PHYSICAL THERAPY TREATMENT NOTE   Patient Name: [name] MRN: [id number] DOB:[birth year], 38 y.o., female Today's Date: 01/24/2024  END OF SESSION:      Past Medical History:  Diagnosis Date   Abdominal pain 12/11/2010   Qualifier: Diagnosis of   By: Charlott MD, Reyes SCULL SNOMED Dx Update Oct 2024     Abnormal Pap smear of cervix    ADHD 09/30/2016   Admitted with dehydration 06/03/2014   Anxiety and depression 11/17/2010   she sees Amy Idell Chime in [place]   At risk for deficient intake of food 03/11/2020   At risk for dehydration 05/06/2020   At risk for diabetes mellitus 02/27/2020   At risk for impaired metabolic function 04/13/2020   At risk for side effect of medication 06/25/2020   BMI 28.0-28.9,adult 09/08/2022   Brachydactyly of fingers    Class 1 obesity with serious comorbidity and body mass index (BMI) of 33.0 to 33.9 in adult 07/13/2020   Constipation, slow transit 11/17/2010   Depression    Dyspnea 02/23/2015   Followed in Pulmonary clinic/ [facility]/ [place]   - 02/23/2015  Walked RA x 3 laps @ 185 ft each stopped due to End of study,  Brisk pace, mild  sob  sats 93%     - D dimer   02/23/15 Pos 0.58 > CTa neg for PE or ILD   03/31/15 >FEV1 105%, ratio 80 and FVC 114%.   - CPST  04/09/15>  There is no evidence of exercise-induced bronchospasm or ventilatory limitations. Low OUES and O2 pulse combi   Esophageal dysphagia 11/17/2010   Esophageal reflux 11/17/2010   Family history of cleft lip 11/14/2011   GAD (generalized anxiety disorder) 03/11/2014   Gastroenteritis 06/02/2014   Generalized anxiety disorder 09/20/2011   GERD (gastroesophageal reflux disease)    History of vitamin D  deficiency 06/15/2022   HPV (human papilloma virus) infection    Insulin  resistance 03/23/2020   Major depressive disorder, recurrent episode, moderate (HCC) 03/11/2014   Mood disorder (HCC),with emotional eating 03/23/2020   NAUSEA  AND VOMITING 12/11/2010   Qualifier: Diagnosis of   By: Charlott MD, Jeffrey         Other hyperlipidemia 06/03/2020   Polyphagia 10/07/2021   Rheumatoid arthritis (HCC) 09/13/2013   Right knee pain 09/06/2016   Spondylosis, lumbosacral 11/17/2010   Tachycardia 01/22/2016   Tracheomalacia, congenital    Vitamin D  deficiency 02/27/2020   Past Surgical History:  Procedure Laterality Date   BACK SURGERY     HAND SURGERY  1996   to elongate the right 3rd metacarpal    LUMBAR FUSION  2008   per Dr. Carles on L4-5 for spondylolisthesis    NASAL SEPTUM SURGERY  01/01/2018   SHOULDER ARTHROSCOPY     Patient Active Problem List   Diagnosis Date Noted   Poor sleep 01/03/2024   Lung nodules 09/28/2023   DOE (dyspnea on exertion) 08/30/2023   Hypokalemia 08/30/2023   Abnormal Pap smear of cervix    Anxiety    Brachydactyly of fingers    GERD (gastroesophageal reflux disease)    HPV (human papilloma virus) infection    Pregnancy    Tracheomalacia, congenital    BMI 28.0-28.9,adult 09/08/2022   History of vitamin D  deficiency 06/15/2022   Polyphagia 10/07/2021   Class 1 obesity with serious comorbidity and body mass index (BMI) of 33.0 to 33.9 in adult 07/13/2020   At risk for side effect  of medication 06/25/2020   Other hyperlipidemia 06/03/2020   At risk for dehydration 05/06/2020   At risk for impaired metabolic function 04/13/2020   Mood disorder (HCC),with emotional eating 03/23/2020   Insulin  resistance 03/23/2020   At risk for deficient intake of food 03/11/2020   Vitamin D  deficiency 02/27/2020   At risk for diabetes mellitus 02/27/2020   Depression 02/27/2020   ADHD 09/30/2016   Right knee pain 09/06/2016   Tachycardia 01/22/2016   Dyspnea 02/23/2015   Threatened preterm labor 07/05/2014   Preterm contractions 07/05/2014   Vacuum extraction, delivered, current hospitalization 07/05/2014   Preterm labor 07/03/2014   Admitted with dehydration 06/03/2014    Gastroenteritis 06/02/2014   Major depressive disorder, recurrent episode, moderate (HCC) 03/11/2014   GAD (generalized anxiety disorder) 03/11/2014   Rheumatoid arthritis (HCC) 09/13/2013   Depressive disorder, not elsewhere classified 07/04/2012   Family history of cleft lip 11/14/2011   Generalized anxiety disorder 09/20/2011   NAUSEA AND VOMITING 12/11/2010   Abdominal pain 12/11/2010   Esophageal reflux 11/17/2010   Constipation, slow transit 11/17/2010   Spondylosis, lumbosacral 11/17/2010   Anxiety and depression 11/17/2010   Esophageal dysphagia 11/17/2010   Anxiety state 09/04/2010    PCP: Johnny Garnette LABOR, MD  REFERRING PROVIDER: Debby Dorn MATSU, MD  REFERRING DIAG: Cervical radiculopathy [M54.12]   THERAPY DIAG:  No diagnosis found.  Rationale for Evaluation and Treatment: Rehabilitation  ONSET DATE: 11/22/2023 date of referral   SUBJECTIVE:                                                                                                                                                                                                         SUBJECTIVE STATEMENT:  Patient reports that she has noticed improvement with PT with duration of pain. She continues to have difficulty with turning her head and is interested in having TPDN at future sessions. Her pain is 3/10 today.    Patient reports that she received MRI results, stating it said that everything was good, and they said we can continue with PT without concern.    Hand dominance: Right  PERTINENT HISTORY:  Relevant PMHx includes ADHD, Anxiety, Depression, Dyspnea, Esophageal dysphagia, Esophageal reflux, RA, Tachycardia, L4-L5 fusion (2008) for spondylolistehesis   PAIN:  Are you having pain? Yes: NPRS scale: 4-5/10 current, 7/10 at worst  Pain location: mid cervical spine  Pain description: aching, burning, heavy, headache  Aggravating factors: turning head  Relieving factors: laying down  (temporarily)    PRECAUTIONS: None  RED FLAGS: None  WEIGHT BEARING RESTRICTIONS: No  FALLS:  Has patient fallen in last 6 months? No  LIVING ENVIRONMENT: Lives with: lives with their family and lives with their spouse Stairs: Yes with railing present   OCCUPATION: Nurse   PLOF: Independent  PATIENT GOALS: to have less pain in neck with daily activities   NEXT MD VISIT: 01/03/2024  OBJECTIVE:  Note: Objective measures were completed at Evaluation unless otherwise noted.   PATIENT SURVEYS:  NDI:  NECK DISABILITY INDEX   Date: 11/30/2023 Score 01/24/2024  Pain intensity 2 = The pain is moderate at the moment 1  2. Personal care (washing, dressing, etc.) 0 = I can look after myself normally without causing extra pain 0  3. Lifting 0 =  I can lift heavy weights without extra pain 0  4. Reading 2 =  I can read as much as I want with moderate pain in my neck 1  5. Headaches 1 =  I have slight headaches, which come infrequently 1  6. Concentration 0 =  I can concentrate fully when I want to with no difficulty 0  7. Work 0 =  I can do as much work as I want to 1  8. Driving 1 =  I can drive my car as long as I want with slight pain in my neck 1  9. Sleeping 1 = My sleep is slightly disturbed (less than 1 hr sleepless) 0  10. Recreation 1 =  I am able to engage in all my recreation activities, with some pain in my neck 0  Total 8/50 5/50   Minimum Detectable Change (90% confidence): 5 points or 10% points  COGNITION: Overall cognitive status: Within functional limits for tasks assessed  SENSATION: Not tested  POSTURE: forward head   Cervical MMT: 4/5 MMT in all directions   Pain with all directions, in same place as primary symptoms   CERVICAL ROM:   Active ROM A/PROM (deg) eval AROM 01/24/2024   Flexion 45 40  Extension 40 40  Right lateral flexion 30 35  Left lateral flexion 40 40  Right rotation 64 60  Left rotation 45 60   (Blank rows = not  tested)  UPPER EXTREMITY ROM: WFL BIL    UPPER EXTREMITY MMT:  MMT Right eval Left eval R L  Shoulder flexion 4 4- 4+ 4+  Shoulder extension      Shoulder abduction 4+ 4 4+ 4+  Shoulder adduction      Shoulder extension      Shoulder internal rotation 4+ 4+ 5 5  Shoulder external rotation 4 4 5 5   Elbow flexion 4+ 4+ 4+ 4+  Elbow extension 5 5 4+ 5  Wrist flexion      Wrist extension      Wrist ulnar deviation      Wrist radial deviation      Wrist pronation      Wrist supination       (Blank rows = not tested)    CERVICAL SPECIAL TESTS:  Cranial cervical flexion test: Positive and Neck flexor muscle endurance test: Positive    TREATMENT DATE:   [facility] Adult PT Treatment:                                                DATE: 01/24/2024  Therapeutic Exercise: UBE L2 4/4 min Seated  hor abd RTB 2 x 10 Seated ER RTB 15x Chin tucks over 1/2 roll with B rotation 10/10 B UT stretch 30x 2 B levator stretch 30s x2 B SCM stretch 30s x    Therapeutic Activity:  Reassessment of objective measures and subjective assessment regarding progress towards established goals and updated plan for addressing remaining deficits and rehab goals.    [facility] Adult PT Treatment:                                                DATE: 01/19/24 Therapeutic Exercise: UBE L2 4/4 min HEP review and update Manual Therapy: Cervical ROM into L rotation from flexed position, PA NAGs at mid cervical region into flexion and L rotation  [facility] Adult PT Treatment:                                                DATE: 01/10/24 Therapeutic Exercise: UBE L2 4/4 min Seated hor abd RTB 15x Seated ER RTB 15x Chin tucks over 1/2 roll with B rotation 10/10 Manual Therapy: B UT stretch 30x 2 B levator stretch 30s x2 B scalene stretch 30s x3 Upper cervical distraction stretch 30s x2 R first rib mobilization with R shoulder flexion                                                                                                                                 PATIENT EDUCATION:  Education details: reviewed initial home exercise program; discussion of POC, prognosis and goals for skilled PT   Person educated: Patient Education method: Explanation, Demonstration, and Handouts Education comprehension: verbalized understanding, returned demonstration, and needs further education  HOME EXERCISE PROGRAM: Access Code: [email or web address] URL: [email or web address] Date: 01/19/2024 Prepared by: Reyes Kohut  Exercises - Prone W Scapular Retraction  - 2 x daily - 7 x weekly - 2 sets - 10 reps - Supine Shoulder Horizontal Abduction with Resistance  - 1 x daily - 7 x weekly - 3 sets - 10 reps - Seated Assisted Cervical Rotation with Towel  - 1 x daily - 7 x weekly - 2 sets - 10 reps - Seated Cervical Flexion Stretch with Finger Support Behind Neck  - 2 x daily - 5 x weekly - 1 sets - 30s hold - Supine Deep Neck Flexor Training - Repetitions  - 2 x daily - 5 x weekly - 2 sets - 10 reps  ASSESSMENT:  CLINICAL IMPRESSION: Continued manual stertch and STM to para cervicals.  Equal rotation at end of session.  Discussed TPDN and patient will consider.  HEP reviewed and updated.  EVAL: Kamdyn is a 38 y.o. female who was seen today for  physical therapy evaluation and treatment for Neck pain with mobility deficits. She is demonstrating decreased CS MMT, decreased UE MMT, and decreased CS and postural mm endurance. She has related pain and difficulty with turning her head, holding her head up throughout the day, and holding head down for reading tasks. She requires skilled PT services at this time to address relevant deficits and improve overall function.     OBJECTIVE IMPAIRMENTS: decreased activity tolerance, decreased endurance, decreased mobility, decreased ROM, decreased strength, impaired UE functional use, postural dysfunction, and pain.   ACTIVITY LIMITATIONS: carrying, lifting, reach over head,  hygiene/grooming, and caring for others  PARTICIPATION LIMITATIONS: meal prep, cleaning, laundry, interpersonal relationship, driving, community activity, and occupation  PERSONAL FACTORS: Time since onset of injury/illness/exacerbation and 3+ comorbidities: Relevant PMHx includes ADHD, Anxiety, Depression, Dyspnea, Esophageal dysphagia, Esophageal reflux, RA, Tachycardia, L4-L5 fusion (2008) for spondylolistehesis are also affecting patient's functional outcome.   REHAB POTENTIAL: Fair    CLINICAL DECISION MAKING: Evolving/moderate complexity  EVALUATION COMPLEXITY: Moderate   GOALS: Goals reviewed with patient? YES  SHORT TERM GOALS: Target date: 12/21/2023   Patient will be independent with initial home program at least 3 days/week.  Baseline: provided at eval Goal Status: MET 12/20/23: Pt reports adherence   2.  Patient will demonstrate improved postural awareness for at least 15 minutes while seated without need for cueing from PT.  Baseline: see objective measures Goal Status: Ongoing   3.  Patient will demonstrate improved active R lateral flexion and Left rotation by at least 5 degrees.  Baseline: see objective measures Goal status: Ongoing   LONG TERM GOALS: Target date: 01/11/2024   Patient will report improved overall functional ability with NDI score of 3/50 or less.  Baseline: 8/50 01/24/24: 5/50 Goal Status: PROGRESSING   2.  Patient will demonstrate improved UQ strength to at least 4+/5 MMT BIL.  Baseline: see objective measures.  Goal status: INITIAL  3.  Patient will be able to maintain cervical flexion endurance test for at least 25 seconds Baseline: <15 seconds 01/24/24: >30 sec  Goal status: MET  4.  Patient will report minimal to no pain with turning her head while driving, nor with work related activities Baseline: moderate pain and difficulty/reported heaviness.  01/23/24: mild-to-moderate pain with end-range and turning quickly to look over her  head Goal status: PROGRESSING    PLAN:  PT FREQUENCY: 1x/week   PT DURATION: 4 weeks   PLANNED INTERVENTIONS: 02835- PT Re-evaluation, 97750- Physical Performance Testing, 97110-Therapeutic exercises, 97530- Therapeutic activity, [id number]- Neuromuscular re-education, 97535- Self Care, 02859- Manual therapy, [id number]- Electrical stimulation (unattended), 02987- Traction (mechanical), 20560 (1-2 muscles), 20561 (3+ muscles)- Dry Needling, Patient/Family education, Taping, Joint mobilization, Spinal mobilization, Cryotherapy, and Moist heat  PLAN FOR NEXT SESSION: begin CS mobility and strengthening program as indicated. Address CS and periscapular mm endurance.    Marko Molt, PT, DPT  01/24/2024 11:50 AM

## 2024-01-24 NOTE — Telephone Encounter (Signed)
 Referral for NEUROPSYCHOLOGY sent through Epic to [facility]. Phone: [phone] Fax: [phone]

## 2024-01-24 NOTE — Progress Notes (Signed)
 Subjective:    [name] is a 38 y.o. female.  HPI    Christine Mar, MD, PhD [facility] [address], [address] [address] [place], [place] [address]  Dear [name],  I saw your [name], [name], upon your kind request in my neurologic clinic today for evaluation of her memory loss.  [name] is unaccompanied today.  As you know, [name] is a 38 year old female with an underlying medical history of reflux disease, history of gastroenteritis, anxiety, depression, vitamin D  deficiency, insulin  resistance, hyperlipidemia, rheumatoid arthritis, tachycardia, cervical radiculopathy, pulmonary nodules, lumbar spondylosis, congenital tracheomalacia, ADHD and overweight state, who reports forgetfulness, also sometimes lapses in her memory where she cannot recall having done something like taking her son to an appointment or meeting.  She reports that her father had early onset Alzheimer's and his sister also had dementia.  She reports that symptoms have been ongoing for a few years.  She has not addressed any concerns with regard to her ADHD affecting her cognitive function or medications affecting her cognitive function with her psychiatrist or behavioral health provider.  She is followed in [place] for behavioral health.  She is currently reducing her BuSpar  as she feels that her anxiety is better.  She does not always sleep well.  She takes melatonin each night.  She takes Ativan  very sparingly, once a week or less.  She hydrates well with water, drinks at least 64 ounces of water per day.  She thinks alcohol infrequently, maybe on Christine weekends, up to 3 drinks per weekend.  She is not aware of any snoring, she does not always rest well.  Epworth sleepiness score is 6 out of 24, fatigue severity score is 40 out of 63.  She has nocturia about once per average night, denies recurrent nocturnal or morning headaches and denies recurrent headaches  in general.  She has not had any sudden onset one-sided weakness or numbness or tingling or droopy face or slurring of speech.  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  She reports that she has not made any mistakes at work and has been able to perform her job duties.    I reviewed your office note from 09/28/2023.  I reviewed recent blood test results in her chart from 08/21/2023.  Her vitamin B12 level at Christine time was 675, vitamin D  was 49.3, TSH was 2.73, free T41.28, T3 117, random insulin  6.5, A1c 4.9.    Of note, she had a brain MRI with and without contrast on 04/04/2016 and I have reviewed Christine results:  IMPRESSION: Unremarkable appearance of Christine brain.   In addition, I personally and independently reviewed images through Christine PACS system.  Of note, she is on several medications including several psychotropic medications.  She is on Auvelity , BuSpar , Vyvanse , Ativan .  Full list of medication as below, she takes melatonin at night.  Her Past Medical History Is Significant For: Past Medical History:  Diagnosis Date   Abdominal pain 12/11/2010   Qualifier: Diagnosis of   By: [name], [name] SNOMED Dx Update Oct 2024     Abnormal Pap smear of cervix    ADHD 09/30/2016   Admitted with dehydration 06/03/2014   Anxiety and depression 11/17/2010   she sees [name] in [place]   At risk for deficient intake of food 03/11/2020   At risk for dehydration 05/06/2020   At  risk for diabetes mellitus 02/27/2020   At risk for impaired metabolic function 04/13/2020   At risk for side effect of medication 06/25/2020   BMI 28.0-28.9,adult 09/08/2022   Brachydactyly of fingers    Class 1 obesity with serious comorbidity and body mass index (BMI) of 33.0 to 33.9 in adult 07/13/2020   Constipation, slow transit 11/17/2010   Depression    Dyspnea 02/23/2015   Followed in Pulmonary clinic/ [facility]/ [place]   - 02/23/2015  Walked RA x 3 laps @ 185 ft each  stopped due to End of study,  Brisk pace, mild  sob  sats 93%     - D dimer   02/23/15 Pos 0.58 > CTa neg for PE or ILD   03/31/15 >FEV1 105%, ratio 80 and FVC 114%.   - CPST  04/09/15>  There is no evidence of exercise-induced bronchospasm or ventilatory limitations. Low OUES and O2 pulse combi   Esophageal dysphagia 11/17/2010   Esophageal reflux 11/17/2010   Family history of cleft lip 11/14/2011   GAD (generalized anxiety disorder) 03/11/2014   Gastroenteritis 06/02/2014   Generalized anxiety disorder 09/20/2011   GERD (gastroesophageal reflux disease)    History of vitamin D  deficiency 06/15/2022   HPV (human papilloma virus) infection    Insulin  resistance 03/23/2020   Major depressive disorder, recurrent episode, moderate (HCC) 03/11/2014   Mood disorder (HCC),with emotional eating 03/23/2020   NAUSEA AND VOMITING 12/11/2010   Qualifier: Diagnosis of   By: [name], [name]         Other hyperlipidemia 06/03/2020   Polyphagia 10/07/2021   Rheumatoid arthritis (HCC) 09/13/2013   Right knee pain 09/06/2016   Spondylosis, lumbosacral 11/17/2010   Tachycardia 01/22/2016   Tracheomalacia, congenital    Vitamin D  deficiency 02/27/2020    Her Past Surgical History Is Significant For: Past Surgical History:  Procedure Laterality Date   BACK SURGERY     HAND SURGERY  1996   to elongate Christine right 3rd metacarpal    LUMBAR FUSION  2008   per Dr. Carles on L4-5 for spondylolisthesis    NASAL SEPTUM SURGERY  01/01/2018   SHOULDER ARTHROSCOPY      Her Family History Is Significant For: Family History  Problem Relation Age of Onset   Arthritis Mother    Hyperlipidemia Father    Heart disease Father    Hypertension Father    Arthritis Father    Depression Father    Anxiety disorder Father    Obesity Father    Alzheimer's disease Father    Alzheimer's disease Sister    Arthritis Brother    Psoriasis Brother    Cleft lip Brother        without palate   Uterine cancer  Maternal Aunt        x 2   Seizures Maternal Aunt    Mental retardation Maternal Aunt        mild   Seizures Cousin    Depression Cousin        attempted suicide   Suicidality Cousin    Colon cancer Neg Hx     Her Social History Is Significant For: Social History   Socioeconomic History   Marital status: Married    Spouse name: Not on file   Number of children: 1   Years of education: Not on file   Highest education level: Not on file  Occupational History   Occupation: nurse    Employer: [employer]  Tobacco Use  Smoking status: Former    Current packs/day: 0.00    Types: Cigarettes    Quit date: 08/11/2007    Years since quitting: 16.4   Smokeless tobacco: Never  Vaping Use   Vaping status: Some Days  Substance and Sexual Activity   Alcohol use: Not Currently    Alcohol/week: 3.0 standard drinks of alcohol    Types: 3 Glasses of wine per week    Comment: 1 glass of wine a few times a week.   Drug use: No   Sexual activity: Yes    Partners: Male    Birth control/protection: None    Comment: had intercourse yesterday  Other Topics Concern   Not on file  Social History Narrative   Not on file   Social Drivers of Health   Financial Resource Strain: Low Risk  (06/28/2021)   Received from [facility]   Overall Financial Resource Strain (CARDIA)    Difficulty of Paying Living Expenses: Not hard at all  Food Insecurity: Not on file  Transportation Needs: Not on file  Physical Activity: Not on file  Stress: No Stress Concern Present (06/28/2021)   Received from [facility] Occup[facility] - Occupational Stress Questionnaire    Feeling of Stress : Not at all  Social Connections: Unknown (08/27/2021)   [facility] from [facility]   Social Network    Social Network: Not on file    Her Allergies Are:  Allergies  Allergen Reactions   Halothane     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   Dextromethorphan -buPROPion  ER (AUVELITY ) 45-105 MG TBCR Take 1 tablet by mouth 2 (two) times daily.   Dextromethorphan -buPROPion  ER (AUVELITY ) 45-105 MG TBCR Take 1 tablet by mouth 2 (two) times daily.   drospirenone -ethinyl estradiol  (YASMIN ) 3-0.03 MG tablet Take 1 tablet by mouth daily. skip placebos and take continuously   lisdexamfetamine (VYVANSE ) 50 MG capsule Take 1 capsule (50 mg total) by mouth every morning.   LORazepam  (ATIVAN ) 1 MG tablet Take 1 tablet (1 mg total) by mouth daily as needed for panic attacks   Melatonin 1 MG CAPS Try up to 5mg  Sustained release   Melatonin 5 MG TABS Take 5 mg by mouth at bedtime as needed (sleep).   Semaglutide -Weight Management (WEGOVY ) 0.5 MG/0.5ML SOAJ Inject 0.5 mg into Christine skin once a week.   Upadacitinib  ER (RINVOQ ) 15 MG TB24 Take 1 tablet (15 mg total) by mouth daily.   Upadacitinib  ER (RINVOQ ) 15 MG TB24 1 tablet Orally Once a day   Vitamin D , Ergocalciferol , (DRISDOL ) 1.25 MG (50000 UNIT) CAPS capsule Take 1 capsule (50,000 Units total) by mouth every 14 (fourteen) days.   No facility-administered encounter medications on file as of 01/24/2024.  :   Review of Systems:  Out of a complete 14 point review of systems, all are reviewed and negative with Christine exception of these symptoms as listed below:   Review of Systems  Neurological:        Memory loss for several years now.   family hx   (father) early onset  ALZ.    ESS 6  FSS 40.  Does not feel rested after sleep.  Some fragmented sleep.     Objective:  Neurological Exam  Physical Exam Physical Examination:   Vitals:   01/24/24  0757  BP: 120/83  Pulse: 80    General Examination: [name] is a very pleasant 38 y.o. female in no acute distress. She appears well-developed and well-nourished and well groomed.  Mildly anxious appearing.  HEENT: Normocephalic, atraumatic, pupils are equal, round and reactive to light, no photophobia.  Funduscopic exam benign.  Extraocular tracking is good without limitation to gaze excursion or nystagmus noted. Hearing is grossly intact. Face is symmetric with normal facial animation and normal facial sensation to light touch, temperature and vibration sense. Speech is clear with no dysarthria noted. There is no hypophonia. There is no lip, neck/head, jaw or voice tremor. Neck is supple with full range of passive and active motion. There are no carotid bruits on auscultation. Oropharynx exam reveals: mild mouth dryness, good dental hygiene and mild airway crowding, due to small airway, Mallampati class I, tonsils small.  Tongue protrudes centrally and palate elevates symmetrically, neck circumference 14-5/8 inches.   Chest: Clear to auscultation without wheezing, rhonchi or crackles noted.  Heart: S1+S2+0, regular and normal without murmurs, rubs or gallops noted.   Abdomen: Soft, non-tender and non-distended.  Extremities: There is no pitting edema in Christine distal lower extremities bilaterally.   Skin: Warm and dry without trophic changes noted.   Musculoskeletal: exam reveals no obvious joint deformities.   Neurologically:  Mental status: [name] is awake, [name] and oriented in all 4 spheres. Her immediate and remote memory, attention, language skills and fund of knowledge are appropriate. There is no evidence of aphasia, agnosia, apraxia or anomia. Speech is clear with normal prosody and enunciation. Thought process is linear. Mood is normal and affect is normal.      01/24/2024    8:08 AM  Montreal Cognitive Assessment   Visuospatial/ Executive (0/5) 5  Naming (0/3) 3   Attention: Read list of digits (0/2) 0  Attention: Read list of letters (0/1) 1  Attention: Serial 7 subtraction starting at 100 (0/3) 3  Language: Repeat phrase (0/2) 2  Language : Fluency (0/1) 1  Abstraction (0/2) 2  Delayed Recall (0/5) 2  Orientation (0/6) 6  Total 25   On 01/24/2024: CDT: 4/4, AFT: 11/min.  Cranial nerves II - XII are as described above under HEENT exam.  Motor exam: Normal bulk, strength and tone is noted. There is no obvious action or resting tremor.  No drift or rebound, no postural or intention tremor. Reflexes 1+ throughout, toes are downgoing bilaterally. Romberg negative. Fine motor skills and coordination: intact finger taps, hand movements and rapid alternating patterning with both upper extremities, normal foot taps bilaterally in Christine lower extremities.  Cerebellar testing: No dysmetria or intention tremor. There is no truncal or gait ataxia.  Normal finger-to-nose, normal heel-to-shin bilaterally. Sensory exam: intact to light touch, temperature and vibration sense in Christine upper and lower extremities.  Gait, station and balance: She stands easily. No veering to one side is noted. No leaning to one side is noted. Posture is age-appropriate and stance is narrow based. Gait shows normal stride length and normal pace. No problems turning are noted.  Normal tandem walk.  Assessment and Plan:   In summary, [name] is a very pleasant 38 y.o.-year old female with an underlying medical history of reflux disease,  history of gastroenteritis, anxiety, depression, vitamin D  deficiency, insulin  resistance, hyperlipidemia, rheumatoid arthritis, tachycardia, cervical radiculopathy, pulmonary nodules, lumbar spondylosis, congenital tracheomalacia, ADHD and overweight state, who presents for evaluation of her cognitive complaints of a few years' duration. She reports a family history of dementia.  She reports memory lapses.  She has a mildly abnormal MoCA score,  this may be confounded by several contributors including history of ADHD, sleep disturbance with possible sleep deprivation or sleep disordered breathing not excluded, and medication effect from taking several psychotropic medications. I had a long discussion with [name] today.  Neurological exam is nonfocal otherwise.  She is largely reassured.  Nevertheless, I had several suggestions.  This was an extended visit of over 60 minutes with copious record review involved in considerable counseling and coordination of care, addressing multiple issues.    Below is a summary of my recommendations and our discussion points from today's visit, based on chart review, history and examination. They were given these instructions verbally during Christine visit in detail and also in writing in Christine MyChart after visit summary (AVS), which they can access electronically. << We will do an EEG (brainwave test), which we will schedule. We will call you with Christine results. You have had recent blood work and I do not believe we need to do any blood work from our end of things at this time.    We will do a brain scan, called MRI and call you with Christine test results. We will have to schedule you for this on a separate date. This test requires authorization from your insurance, and we will take care of Christine insurance process. I will request a formal cognitive test called neuropsychological evaluation which is done by a licensed neuropsychologist. We will make a referral in that regard.  Keep in mind that your cognitive concerns may also tie in with medication side effects and your underlying ADHD, and may be exacerbated by sleep disturbance. I recommend we proceed with a sleep study to rule out obstructive sleep apnea.  If you have obstructive sleep apnea I will likely recommend treatment with a CPAP or AutoPap machine. We will keep you posted as to your test results by phone call for now.  We will plan a follow-up after testing.    >>   Thank you very much for allowing me to participate in Christine care of this nice [name]. If I can be of any further assistance to you please do not hesitate to call me at [phone].  Sincerely,   Christine Mar, MD, PhD

## 2024-01-24 NOTE — Addendum Note (Signed)
 Addended by: Nikkol Pai on: 01/24/2024 09:29 AM   Modules accepted: Orders

## 2024-01-24 NOTE — Patient Instructions (Addendum)
 It was nice to meet you today.  You have complaints of memory loss: memory loss or changes in cognitive function can have many reasons and does not always mean you have dementia.  There are several conditions and situations that can contribute to subjective or objective memory loss.  These factors include: depression, stress, sleep deprivation or poor sleep from insomnia or sleep apnea, dehydration, fluctuation in blood sugar values, thyroid  or electrolyte dysfunction, medication effects from sedating medications or narcotic pain medication for example and certain vitamin deficiencies such as vitamin B12 deficiency, and anemia. Dementia can be caused by stroke, brain atherosclerosis or brain vascular disease due to vascular risk factors (smoking, high blood pressure, high cholesterol, obesity and uncontrolled diabetes), certain degenerative brain disorders (including Parkinson's disease and Multiple sclerosis) and by Alzheimer's disease or other, more rare and sometimes hereditary causes.   Here is what I would recommend:   We will do an EEG (brainwave test), which we will schedule. We will call you with the results. You have had recent blood work and I do not believe we need to do any blood work from our end of things at this time.    We will do a brain scan, called MRI and call you with the test results. We will have to schedule you for this on a separate date. This test requires authorization from your insurance, and we will take care of the insurance process. I will request a formal cognitive test called neuropsychological evaluation which is done by a licensed neuropsychologist. We will make a referral in that regard.  Keep in mind that your cognitive concerns may also tie in with medication side effects and your underlying ADHD, and may be exacerbated by sleep disturbance. I recommend we proceed with a sleep study to rule out obstructive sleep apnea.  If you have obstructive sleep apnea I will likely  recommend treatment with a CPAP or AutoPap machine. We will keep you posted as to your test results by phone call for now.  We will plan a follow-up after testing.

## 2024-02-05 ENCOUNTER — Ambulatory Visit: Attending: Family Medicine | Admitting: Pharmacist

## 2024-02-05 ENCOUNTER — Other Ambulatory Visit: Payer: Self-pay

## 2024-02-05 ENCOUNTER — Ambulatory Visit: Admitting: Physical Therapy

## 2024-02-05 DIAGNOSIS — Z7189 Other specified counseling: Secondary | ICD-10-CM

## 2024-02-05 DIAGNOSIS — M542 Cervicalgia: Secondary | ICD-10-CM

## 2024-02-05 DIAGNOSIS — R293 Abnormal posture: Secondary | ICD-10-CM

## 2024-02-05 MED ORDER — RINVOQ 15 MG PO TB24
ORAL_TABLET | ORAL | 0 refills | Status: DC
  Filled 2024-02-05: qty 30, 30d supply, fill #0

## 2024-02-05 NOTE — Progress Notes (Signed)
  S: Patient presents today for review of their specialty medication.   Patient is taking Rinvoq  for rheumatoid arthritis. Patient is managed by Dr. Lalama for this.   Dosing: Adult  Note: May be used as monotherapy or in combination with methotrexate or other nonbiologic disease-modifying antirheumatic drugs (DMARDs); use in combination with biologic DMARDS or potent immunosuppressants (eg, azathioprine, cyclosporine) is not recommended. Do not initiate therapy in patients with an absolute lymphocyte count <500/mm3, ANC <1,000/mm3, or hemoglobin <8 g/dL. Rheumatoid arthritis: Oral: 15 mg once daily.  Adherence: confirmed  Efficacy: reports that the medication works well for her   Monitoring: S/sx thromboembolism: none   S/sx malignancy: none S/sx of infection: none  Current adverse effects: none  O:   Lab Results  Component Value Date   WBC 5.7 08/22/2023   HGB 13.5 08/22/2023   HCT 38.4 08/22/2023   MCV 94.6 08/22/2023   PLT 280 08/22/2023      Chemistry      Component Value Date/Time   NA 137 08/30/2023 0837   K 4.8 08/30/2023 0837   CL 100 08/30/2023 0837   CO2 22 08/30/2023 0837   BUN 9 08/30/2023 0837   CREATININE 0.84 08/30/2023 0837   CREATININE 0.87 12/19/2019 0946      Component Value Date/Time   CALCIUM  9.5 08/30/2023 0837   ALKPHOS 48 08/22/2023 0854   AST 41 08/22/2023 0854   ALT 33 08/22/2023 0854   BILITOT 0.2 08/22/2023 0854   BILITOT <0.2 06/15/2022 0840      Lab Results  Component Value Date   CHOL 258 (H) 06/15/2022   HDL 141 06/15/2022   LDLCALC 100 (H) 06/15/2022   TRIG 103 06/15/2022   CHOLHDL 2.2 12/01/2021    A/P: 1. Medication review: patient currently taking Rinvoq  for rheumatoid arthritis. Reviewed the medication with the patient, including the following: Rinvoq  is a medication used to treat rheumatoid arthritis. Administer with or without food. Swallow tablet whole; do not crush, split, or chew. Possible adverse effects  include increased risk of infection, GI upset, hematologic toxicity, hepatic effects, lipid abnormalities, increased risk of malignancy, thromboembolism. Avoid live vaccinations. No recommendations for any changes.  [name], PharmD, JAQUELINE, CPP Clinical Pharmacist [facility] [phone]

## 2024-02-05 NOTE — Progress Notes (Signed)
 Specialty Pharmacy Refill Coordination Note  [name] Alert is a 38 y.o. female contacted today regarding refills of specialty medication(s) Upadacitinib  (Rinvoq )   Patient requested Delivery   Delivery date: 02/07/24   Verified address: [address] [place], [place], [address]   Medication will be filled on 02/06/24.

## 2024-02-05 NOTE — Patient Instructions (Signed)

## 2024-02-05 NOTE — Therapy (Signed)
 OUTPATIENT PHYSICAL THERAPY TREATMENT NOTE   Patient Name: [name] MRN: [id number] DOB:February 17, 1986, 38 y.o., female Today's Date: 02/05/2024  END OF SESSION:  PT End of Session - 02/05/24 1112     Visit Number 8    Number of Visits 12    Date for Recertification  03/20/24    Authorization Type [facility] [employer]    PT Start Time 1108    PT Stop Time 1155    PT Time Calculation (min) 47 min    Activity Tolerance Patient tolerated treatment well    Behavior During Therapy [facility] for tasks assessed/performed           PT End of Session - 02/05/24 1112     Visit Number 8    Number of Visits 12    Date for Recertification  03/20/24    Authorization Type [facility] [employer]    PT Start Time 1108    PT Stop Time 1155    PT Time Calculation (min) 47 min    Activity Tolerance Patient tolerated treatment well    Behavior During Therapy WFL for tasks assessed/performed             Past Medical History:  Diagnosis Date   Abdominal pain 12/11/2010   Qualifier: Diagnosis of   By: Charlott MD, Reyes SCULL SNOMED Dx Update Oct 2024     Abnormal Pap smear of cervix    ADHD 09/30/2016   Admitted with dehydration 06/03/2014   Anxiety and depression 11/17/2010   she sees Amy Idell Chime in [place]   At risk for deficient intake of food 03/11/2020   At risk for dehydration 05/06/2020   At risk for diabetes mellitus 02/27/2020   At risk for impaired metabolic function 04/13/2020   At risk for side effect of medication 06/25/2020   BMI 28.0-28.9,adult 09/08/2022   Brachydactyly of fingers    Class 1 obesity with serious comorbidity and body mass index (BMI) of 33.0 to 33.9 in adult 07/13/2020   Constipation, slow transit 11/17/2010   Depression    Dyspnea 02/23/2015   Followed in Pulmonary clinic/ [facility]/ [place]   - 02/23/2015  Walked RA x 3 laps @ 185 ft each stopped due to End of study,  Brisk pace, mild  sob  sats 93%     - D dimer   02/23/15 Pos 0.58 > CTa neg  for PE or ILD   03/31/15 >FEV1 105%, ratio 80 and FVC 114%.   - CPST  04/09/15>  There is no evidence of exercise-induced bronchospasm or ventilatory limitations. Low OUES and O2 pulse combi   Esophageal dysphagia 11/17/2010   Esophageal reflux 11/17/2010   Family history of cleft lip 11/14/2011   GAD (generalized anxiety disorder) 03/11/2014   Gastroenteritis 06/02/2014   Generalized anxiety disorder 09/20/2011   GERD (gastroesophageal reflux disease)    History of vitamin D  deficiency 06/15/2022   HPV (human papilloma virus) infection    Insulin  resistance 03/23/2020   Major depressive disorder, recurrent episode, moderate (HCC) 03/11/2014   Mood disorder (HCC),with emotional eating 03/23/2020   NAUSEA AND VOMITING 12/11/2010   Qualifier: Diagnosis of   By: Charlott MD, Reyes         Other hyperlipidemia 06/03/2020   Polyphagia 10/07/2021   Rheumatoid arthritis (HCC) 09/13/2013   Right knee pain 09/06/2016   Spondylosis, lumbosacral 11/17/2010   Tachycardia 01/22/2016   Tracheomalacia, congenital    Vitamin D  deficiency 02/27/2020   Past  Surgical History:  Procedure Laterality Date   BACK SURGERY     HAND SURGERY  1996   to elongate the right 3rd metacarpal    LUMBAR FUSION  2008   per Dr. Carles on L4-5 for spondylolisthesis    NASAL SEPTUM SURGERY  01/01/2018   SHOULDER ARTHROSCOPY     Patient Active Problem List   Diagnosis Date Noted   Poor sleep 01/03/2024   Lung nodules 09/28/2023   DOE (dyspnea on exertion) 08/30/2023   Hypokalemia 08/30/2023   Abnormal Pap smear of cervix    Anxiety    Brachydactyly of fingers    GERD (gastroesophageal reflux disease)    HPV (human papilloma virus) infection    Pregnancy    Tracheomalacia, congenital    BMI 28.0-28.9,adult 09/08/2022   History of vitamin D  deficiency 06/15/2022   Polyphagia 10/07/2021   Class 1 obesity with serious comorbidity and body mass index (BMI) of 33.0 to 33.9 in adult 07/13/2020   At risk for  side effect of medication 06/25/2020   Other hyperlipidemia 06/03/2020   At risk for dehydration 05/06/2020   At risk for impaired metabolic function 04/13/2020   Mood disorder (HCC),with emotional eating 03/23/2020   Insulin  resistance 03/23/2020   At risk for deficient intake of food 03/11/2020   Vitamin D  deficiency 02/27/2020   At risk for diabetes mellitus 02/27/2020   Depression 02/27/2020   ADHD 09/30/2016   Right knee pain 09/06/2016   Tachycardia 01/22/2016   Dyspnea 02/23/2015   Threatened preterm labor 07/05/2014   Preterm contractions 07/05/2014   Vacuum extraction, delivered, current hospitalization 07/05/2014   Preterm labor 07/03/2014   Admitted with dehydration 06/03/2014   Gastroenteritis 06/02/2014   Major depressive disorder, recurrent episode, moderate (HCC) 03/11/2014   GAD (generalized anxiety disorder) 03/11/2014   Rheumatoid arthritis (HCC) 09/13/2013   Depressive disorder, not elsewhere classified 07/04/2012   Family history of cleft lip 11/14/2011   Generalized anxiety disorder 09/20/2011   NAUSEA AND VOMITING 12/11/2010   Abdominal pain 12/11/2010   Esophageal reflux 11/17/2010   Constipation, slow transit 11/17/2010   Spondylosis, lumbosacral 11/17/2010   Anxiety and depression 11/17/2010   Esophageal dysphagia 11/17/2010   Anxiety state 09/04/2010    PCP: Johnny Garnette LABOR, MD  REFERRING PROVIDER: Debby Dorn MATSU, MD  REFERRING DIAG: Cervical radiculopathy [F45.87]   THERAPY DIAG:  Cervicalgia  Abnormal posture  Rationale for Evaluation and Treatment: Rehabilitation  ONSET DATE: 11/22/2023 date of referral   SUBJECTIVE:  SUBJECTIVE STATEMENT: 02/05/2024 I've been doing pretty.   Patient reports that she received MRI results, stating  it said that everything was good, and they said we can continue with PT without concern.    Hand dominance: Right  PERTINENT HISTORY:  Relevant PMHx includes ADHD, Anxiety, Depression, Dyspnea, Esophageal dysphagia, Esophageal reflux, RA, Tachycardia, L4-L5 fusion (2008) for spondylolistehesis   PAIN:  Are you having pain? Yes: NPRS scale: 3/10 Pain location: mid cervical spine  Pain description: aching, burning, heavy, headache  Aggravating factors: turning head  Relieving factors: laying down (temporarily)    PRECAUTIONS: None  RED FLAGS: None     WEIGHT BEARING RESTRICTIONS: No  FALLS:  Has patient fallen in last 6 months? No  LIVING ENVIRONMENT: Lives with: lives with their family and lives with their spouse Stairs: Yes with railing present   OCCUPATION: Nurse   PLOF: Independent  PATIENT GOALS: to have less pain in neck with daily activities   NEXT MD VISIT: 01/03/2024  OBJECTIVE:  Note: Objective measures were completed at Evaluation unless otherwise noted.   PATIENT SURVEYS:  NDI:  NECK DISABILITY INDEX   Date: 11/30/2023 Score 01/24/2024  Pain intensity 2 = The pain is moderate at the moment 1  2. Personal care (washing, dressing, etc.) 0 = I can look after myself normally without causing extra pain 0  3. Lifting 0 =  I can lift heavy weights without extra pain 0  4. Reading 2 =  I can read as much as I want with moderate pain in my neck 1  5. Headaches 1 =  I have slight headaches, which come infrequently 1  6. Concentration 0 =  I can concentrate fully when I want to with no difficulty 0  7. Work 0 =  I can do as much work as I want to 1  8. Driving 1 =  I can drive my car as long as I want with slight pain in my neck 1  9. Sleeping 1 = My sleep is slightly disturbed (less than 1 hr sleepless) 0  10. Recreation 1 =  I am able to engage in all my recreation activities, with some pain in my neck 0  Total 8/50 5/50   Minimum Detectable Change (90%  confidence): 5 points or 10% points  COGNITION: Overall cognitive status: Within functional limits for tasks assessed  SENSATION: Not tested  POSTURE: forward head   Cervical MMT: 4/5 MMT in all directions   Pain with all directions, in same place as primary symptoms   CERVICAL ROM:   Active ROM A/PROM (deg) eval AROM 01/24/2024   Flexion 45 40  Extension 40 40  Right lateral flexion 30 35  Left lateral flexion 40 40  Right rotation 64 60  Left rotation 45 60   (Blank rows = not tested)  UPPER EXTREMITY ROM: WFL BIL    UPPER EXTREMITY MMT:  MMT Right eval Left eval R L  Shoulder flexion 4 4- 4+ 4+  Shoulder extension      Shoulder abduction 4+ 4 4+ 4+  Shoulder adduction      Shoulder extension      Shoulder internal rotation 4+ 4+ 5 5  Shoulder external rotation 4 4 5 5   Elbow flexion 4+ 4+ 4+ 4+  Elbow extension 5 5 4+ 5  Wrist flexion      Wrist extension      Wrist ulnar deviation      Wrist radial deviation  Wrist pronation      Wrist supination       (Blank rows = not tested)    CERVICAL SPECIAL TESTS:  Cranial cervical flexion test: Positive and Neck flexor muscle endurance test: Positive    TREATMENT DATE:  [facility] Adult PT Treatment:                                                DATE: 02/05/24 IASTM along the bil cervical paraspinals Bil first rib mobs UBE L5 x 4 min (2 min forward/ backward) Lower trap activation with elbows propped on bolster Chin tuck seated into band  Updated HEP for chin tuck  Trigger Point Dry Needling  Initial Treatment: Pt instructed on Dry Needling rational, procedures, and possible side effects. Pt instructed to expect mild to moderate muscle soreness later in the day and/or into the next day.  Pt instructed in methods to reduce muscle soreness. Pt instructed to continue prescribed HEP. Because Dry Needling was performed over or adjacent to a lung field, pt was educated on S/S of pneumothorax and to seek  immediate medical attention should they occur.  Patient was educated on signs and symptoms of infection and other risk factors and advised to seek medical attention should they occur.  Patient verbalized understanding of these instructions and education.   Patient Verbal Consent Given: Yes Education Handout Provided: Yes Muscles Treated: bil cervical paraspinals, and sub-occipitals Electrical Stimulation Performed: No Treatment Response/Outcome: Twitch response, muscle lengthenin g    [facility] Adult PT Treatment:                                                DATE: 01/24/2024  Therapeutic Exercise: UBE L2 4/4 min Seated hor abd green TB 2 x 10 Seated ER green TB 15x B UT stretch 30x 2 B levator stretch 30s x2 B SCM stretch 30s x    Therapeutic Activity:  Reassessment of objective measures and subjective assessment regarding progress towards established goals and updated plan for addressing remaining deficits and rehab goals.    [facility] Adult PT Treatment:                                                DATE: 01/19/24 Therapeutic Exercise: UBE L2 4/4 min HEP review and update Manual Therapy: Cervical ROM into L rotation from flexed position, PA NAGs at mid cervical region into flexion and L rotation  [facility] Adult PT Treatment:                                                DATE: 01/10/24 Therapeutic Exercise: UBE L2 4/4 min Seated hor abd RTB 15x Seated ER RTB 15x Chin tucks over 1/2 roll with B rotation 10/10 Manual Therapy: B UT stretch 30x 2 B levator stretch 30s x2 B scalene stretch 30s x3 Upper cervical distraction stretch 30s x2 R first rib mobilization with R shoulder flexion  PATIENT EDUCATION:  Education details: reviewed initial home exercise program; discussion of POC, prognosis and goals for skilled PT   Person educated: Patient Education  method: Explanation, Demonstration, and Handouts Education comprehension: verbalized understanding, returned demonstration, and needs further education  HOME EXERCISE PROGRAM: Access Code: [email or web address] URL: [email or web address] Date: 02/05/2024 Prepared by: Joneen Fresh  Exercises - Prone W Scapular Retraction  - 2 x daily - 7 x weekly - 2 sets - 10 reps - Supine Shoulder Horizontal Abduction with Resistance  - 1 x daily - 7 x weekly - 3 sets - 10 reps - Seated Assisted Cervical Rotation with Towel  - 1 x daily - 7 x weekly - 2 sets - 10 reps - Seated Cervical Flexion Stretch with Finger Support Behind Neck  - 2 x daily - 5 x weekly - 1 sets - 30s hold - Supine Deep Neck Flexor Training - Repetitions  - 2 x daily - 5 x weekly - 2 sets - 10 reps - Cervical Retraction with Resistance  - 1 x daily - 7 x weekly - 2 sets - 10 reps  ASSESSMENT:  CLINICAL IMPRESSION:  10/13/2025Mrs Lyle arrives to session noting 3/10 pain today. Educated and consent was given for TPDN focusing on bil cervical paraspinals, sub-occipitals and bil upper traps followed with IASTM techniques and rib mobs. Continued working on DNF and posterior chain activation. End of session she noted feeling better / looser than when she arrived .     OBJECTIVE IMPAIRMENTS: decreased activity tolerance, decreased endurance, decreased mobility, decreased ROM, decreased strength, impaired UE functional use, postural dysfunction, and pain.   ACTIVITY LIMITATIONS: carrying, lifting, reach over head, hygiene/grooming, and caring for others  PARTICIPATION LIMITATIONS: meal prep, cleaning, laundry, interpersonal relationship, driving, community activity, and occupation  PERSONAL FACTORS: Time since onset of injury/illness/exacerbation and 3+ comorbidities: Relevant PMHx includes ADHD, Anxiety, Depression, Dyspnea, Esophageal dysphagia, Esophageal reflux, RA, Tachycardia, L4-L5 fusion (2008) for spondylolistehesis are  also affecting patient's functional outcome.   REHAB POTENTIAL: Fair    CLINICAL DECISION MAKING: Evolving/moderate complexity  EVALUATION COMPLEXITY: Moderate   GOALS: Goals reviewed with patient? YES  SHORT TERM GOALS: Target date: 12/21/2023   Patient will be independent with initial home program at least 3 days/week.  Baseline: provided at eval Goal Status: MET 12/20/23: Pt reports adherence   2.  Patient will demonstrate improved postural awareness for at least 15 minutes while seated without need for cueing from PT.  Baseline: see objective measures Goal Status: MET  3.  Patient will demonstrate improved active R lateral flexion and Left rotation by at least 5 degrees.  Baseline: see objective measures Goal status: met   LONG TERM GOALS: Target date: 02/22/2024   Patient will report improved overall functional ability with NDI score of 3/50 or less.  Baseline: 8/50 01/24/24: 5/50 Goal Status: PROGRESSING   2.  Patient will demonstrate improved UQ strength to at least 4+/5 MMT BIL.  Baseline: see objective measures.  Goal status:MET  3.  Patient will be able to maintain cervical flexion endurance test for at least 25 seconds Baseline: <15 seconds 01/24/24: >30 sec  Goal status: MET  4.  Patient will report minimal to no pain with turning her head while driving, nor with work related activities Baseline: moderate pain and difficulty/reported heaviness.  01/24/24: mild-to-moderate pain with end-range and turning quickly to look over her head Goal status: PROGRESSING    PLAN:  PT FREQUENCY: 1x/week   PT DURATION: 4 weeks  PLANNED INTERVENTIONS: [id number]- PT Re-evaluation, 97750- Physical Performance Testing, 97110-Therapeutic exercises, 97530- Therapeutic activity, [id number]- Neuromuscular re-education, 97535- Self Care, [id number]- Manual therapy, [id number]- Electrical stimulation (unattended), [phone]- Traction (mechanical), 20560 (1-2 muscles), 20561 (3+ muscles)- Dry  Needling, Patient/Family education, Taping, Joint mobilization, Spinal mobilization, Cryotherapy, and Moist heat  PLAN FOR NEXT SESSION: continue use of exercise, manual therapy and response to  TPDN to address remaining ROM deficits and pain with end-range.   Nija Koopman PT, DPT, LAT, ATC  02/05/24  12:08 PM

## 2024-02-08 ENCOUNTER — Ambulatory Visit: Admitting: Neurology

## 2024-02-12 ENCOUNTER — Telehealth: Payer: Self-pay | Admitting: Neurology

## 2024-02-12 NOTE — Telephone Encounter (Signed)
 02/12/24 x2 LVM E 01/31/24 LVM KS 01/24/24 [facility] aetna no auth req EE

## 2024-02-14 ENCOUNTER — Ambulatory Visit: Admitting: Neurology

## 2024-02-14 ENCOUNTER — Ambulatory Visit

## 2024-02-14 ENCOUNTER — Ambulatory Visit: Payer: Self-pay | Admitting: Neurology

## 2024-02-14 DIAGNOSIS — M542 Cervicalgia: Secondary | ICD-10-CM | POA: Diagnosis not present

## 2024-02-14 DIAGNOSIS — R351 Nocturia: Secondary | ICD-10-CM

## 2024-02-14 DIAGNOSIS — R419 Unspecified symptoms and signs involving cognitive functions and awareness: Secondary | ICD-10-CM

## 2024-02-14 DIAGNOSIS — Z818 Family history of other mental and behavioral disorders: Secondary | ICD-10-CM

## 2024-02-14 DIAGNOSIS — R293 Abnormal posture: Secondary | ICD-10-CM

## 2024-02-14 DIAGNOSIS — R4182 Altered mental status, unspecified: Secondary | ICD-10-CM | POA: Diagnosis not present

## 2024-02-14 DIAGNOSIS — Z9189 Other specified personal risk factors, not elsewhere classified: Secondary | ICD-10-CM

## 2024-02-14 DIAGNOSIS — R413 Other amnesia: Secondary | ICD-10-CM

## 2024-02-14 DIAGNOSIS — Z8659 Personal history of other mental and behavioral disorders: Secondary | ICD-10-CM

## 2024-02-14 DIAGNOSIS — G479 Sleep disorder, unspecified: Secondary | ICD-10-CM

## 2024-02-14 NOTE — Therapy (Signed)
 OUTPATIENT PHYSICAL THERAPY TREATMENT NOTE   Patient Name: [name] MRN: [id number] DOB:1985-09-02, 38 y.o., female Today's Date: 02/14/2024  END OF SESSION:  PT End of Session - 02/14/24 1213     Visit Number 8    Number of Visits 12    Date for Recertification  03/20/24    Authorization Type [facility] A[employer]    PT Start Time 1215    PT Stop Time 1300    PT Time Calculation (min) 45 min    Activity Tolerance Patient tolerated treatment well    Behavior During Therapy [facility] for tasks assessed/performed            PT End of Session - 02/14/24 1213     Visit Number 8    Number of Visits 12    Date for Recertification  03/20/24    Authorization Type [facility] A[employer]    PT Start Time 1215    PT Stop Time 1300    PT Time Calculation (min) 45 min    Activity Tolerance Patient tolerated treatment well    Behavior During Therapy [facility] for tasks assessed/performed              Past Medical History:  Diagnosis Date   Abdominal pain 12/11/2010   Qualifier: Diagnosis of   By: Charlott MD, Reyes SCULL SNOMED Dx Update Oct 2024     Abnormal Pap smear of cervix    ADHD 09/30/2016   Admitted with dehydration 06/03/2014   Anxiety and depression 11/17/2010   she sees Amy Idell Chime in [place]   At risk for deficient intake of food 03/11/2020   At risk for dehydration 05/06/2020   At risk for diabetes mellitus 02/27/2020   At risk for impaired metabolic function 04/13/2020   At risk for side effect of medication 06/25/2020   BMI 28.0-28.9,adult 09/08/2022   Brachydactyly of fingers    Class 1 obesity with serious comorbidity and body mass index (BMI) of 33.0 to 33.9 in adult 07/13/2020   Constipation, slow transit 11/17/2010   Depression    Dyspnea 02/23/2015   Followed in Pulmonary clinic/ [facility]/ [place]   - 02/23/2015  Walked RA x 3 laps @ 185 ft each stopped due to End of study,  Brisk pace, mild  sob  sats 93%     - D dimer   02/23/15 Pos 0.58 > CTa  neg for PE or ILD   03/31/15 >FEV1 105%, ratio 80 and FVC 114%.   - CPST  04/09/15>  There is no evidence of exercise-induced bronchospasm or ventilatory limitations. Low OUES and O2 pulse combi   Esophageal dysphagia 11/17/2010   Esophageal reflux 11/17/2010   Family history of cleft lip 11/14/2011   GAD (generalized anxiety disorder) 03/11/2014   Gastroenteritis 06/02/2014   Generalized anxiety disorder 09/20/2011   GERD (gastroesophageal reflux disease)    History of vitamin D  deficiency 06/15/2022   HPV (human papilloma virus) infection    Insulin  resistance 03/23/2020   Major depressive disorder, recurrent episode, moderate (HCC) 03/11/2014   Mood disorder (HCC),with emotional eating 03/23/2020   NAUSEA AND VOMITING 12/11/2010   Qualifier: Diagnosis of   By: Charlott MD, Reyes         Other hyperlipidemia 06/03/2020   Polyphagia 10/07/2021   Rheumatoid arthritis (HCC) 09/13/2013   Right knee pain 09/06/2016   Spondylosis, lumbosacral 11/17/2010   Tachycardia 01/22/2016   Tracheomalacia, congenital    Vitamin D  deficiency 02/27/2020  Past Surgical History:  Procedure Laterality Date   BACK SURGERY     HAND SURGERY  1996   to elongate the right 3rd metacarpal    LUMBAR FUSION  2008   per Dr. Carles on L4-5 for spondylolisthesis    NASAL SEPTUM SURGERY  01/01/2018   SHOULDER ARTHROSCOPY     Patient Active Problem List   Diagnosis Date Noted   Poor sleep 01/03/2024   Lung nodules 09/28/2023   DOE (dyspnea on exertion) 08/30/2023   Hypokalemia 08/30/2023   Abnormal Pap smear of cervix    Anxiety    Brachydactyly of fingers    GERD (gastroesophageal reflux disease)    HPV (human papilloma virus) infection    Pregnancy    Tracheomalacia, congenital    BMI 28.0-28.9,adult 09/08/2022   History of vitamin D  deficiency 06/15/2022   Polyphagia 10/07/2021   Class 1 obesity with serious comorbidity and body mass index (BMI) of 33.0 to 33.9 in adult 07/13/2020   At risk  for side effect of medication 06/25/2020   Other hyperlipidemia 06/03/2020   At risk for dehydration 05/06/2020   At risk for impaired metabolic function 04/13/2020   Mood disorder (HCC),with emotional eating 03/23/2020   Insulin  resistance 03/23/2020   At risk for deficient intake of food 03/11/2020   Vitamin D  deficiency 02/27/2020   At risk for diabetes mellitus 02/27/2020   Depression 02/27/2020   ADHD 09/30/2016   Right knee pain 09/06/2016   Tachycardia 01/22/2016   Dyspnea 02/23/2015   Threatened preterm labor 07/05/2014   Preterm contractions 07/05/2014   Vacuum extraction, delivered, current hospitalization 07/05/2014   Preterm labor 07/03/2014   Admitted with dehydration 06/03/2014   Gastroenteritis 06/02/2014   Major depressive disorder, recurrent episode, moderate (HCC) 03/11/2014   GAD (generalized anxiety disorder) 03/11/2014   Rheumatoid arthritis (HCC) 09/13/2013   Depressive disorder, not elsewhere classified 07/04/2012   Family history of cleft lip 11/14/2011   Generalized anxiety disorder 09/20/2011   NAUSEA AND VOMITING 12/11/2010   Abdominal pain 12/11/2010   Esophageal reflux 11/17/2010   Constipation, slow transit 11/17/2010   Spondylosis, lumbosacral 11/17/2010   Anxiety and depression 11/17/2010   Esophageal dysphagia 11/17/2010   Anxiety state 09/04/2010    PCP: Johnny Garnette LABOR, MD  REFERRING PROVIDER: Debby Dorn MATSU, MD  REFERRING DIAG: Cervical radiculopathy [F45.87]   THERAPY DIAG:  Cervicalgia  Abnormal posture  Rationale for Evaluation and Treatment: Rehabilitation  ONSET DATE: 11/22/2023 date of referral   SUBJECTIVE:  SUBJECTIVE STATEMENT: Felt needling helped as she has less tightness and pain.  Requests to repeat  procedure.    Patient reports that she received MRI results, stating it said that everything was good, and they said we can continue with PT without concern.    Hand dominance: Right  PERTINENT HISTORY:  Relevant PMHx includes ADHD, Anxiety, Depression, Dyspnea, Esophageal dysphagia, Esophageal reflux, RA, Tachycardia, L4-L5 fusion (2008) for spondylolistehesis   PAIN:  Are you having pain? Yes: NPRS scale: 3/10 Pain location: mid cervical spine  Pain description: aching, burning, heavy, headache  Aggravating factors: turning head  Relieving factors: laying down (temporarily)    PRECAUTIONS: None  RED FLAGS: None     WEIGHT BEARING RESTRICTIONS: No  FALLS:  Has patient fallen in last 6 months? No  LIVING ENVIRONMENT: Lives with: lives with their family and lives with their spouse Stairs: Yes with railing present   OCCUPATION: Nurse   PLOF: Independent  PATIENT GOALS: to have less pain in neck with daily activities   NEXT MD VISIT: 01/03/2024  OBJECTIVE:  Note: Objective measures were completed at Evaluation unless otherwise noted.   PATIENT SURVEYS:  NDI:  NECK DISABILITY INDEX   Date: 11/30/2023 Score 01/24/2024  Pain intensity 2 = The pain is moderate at the moment 1  2. Personal care (washing, dressing, etc.) 0 = I can look after myself normally without causing extra pain 0  3. Lifting 0 =  I can lift heavy weights without extra pain 0  4. Reading 2 =  I can read as much as I want with moderate pain in my neck 1  5. Headaches 1 =  I have slight headaches, which come infrequently 1  6. Concentration 0 =  I can concentrate fully when I want to with no difficulty 0  7. Work 0 =  I can do as much work as I want to 1  8. Driving 1 =  I can drive my car as long as I want with slight pain in my neck 1  9. Sleeping 1 = My sleep is slightly disturbed (less than 1 hr sleepless) 0  10. Recreation 1 =  I am able to engage in all my recreation activities, with some  pain in my neck 0  Total 8/50 5/50   Minimum Detectable Change (90% confidence): 5 points or 10% points  COGNITION: Overall cognitive status: Within functional limits for tasks assessed  SENSATION: Not tested  POSTURE: forward head   Cervical MMT: 4/5 MMT in all directions   Pain with all directions, in same place as primary symptoms   CERVICAL ROM:   Active ROM A/PROM (deg) eval AROM 01/24/2024   Flexion 45 40  Extension 40 40  Right lateral flexion 30 35  Left lateral flexion 40 40  Right rotation 64 60  Left rotation 45 60   (Blank rows = not tested)  UPPER EXTREMITY ROM: WFL BIL    UPPER EXTREMITY MMT:  MMT Right eval Left eval R L  Shoulder flexion 4 4- 4+ 4+  Shoulder extension      Shoulder abduction 4+ 4 4+ 4+  Shoulder adduction      Shoulder extension      Shoulder internal rotation 4+ 4+ 5 5  Shoulder external rotation 4 4 5 5   Elbow flexion 4+ 4+ 4+ 4+  Elbow extension 5 5 4+ 5  Wrist flexion      Wrist extension      Wrist ulnar  deviation      Wrist radial deviation      Wrist pronation      Wrist supination       (Blank rows = not tested)    CERVICAL SPECIAL TESTS:  Cranial cervical flexion test: Positive and Neck flexor muscle endurance test: Positive    TREATMENT DATE:  [facility] Adult PT Treatment:                                                DATE: 02/14/24 Therapeutic Exercise: UBE L1 4/4 min Manual Therapy: B UT stretch 30x 2 B levator stretch 30s x2 B scalene stretch 30s x3  Trigger Point Dry Needling  Subsequent Treatment: Instructions provided previously at initial dry needling treatment.   Patient Verbal Consent Given: Yes Education Handout Provided: Previously Provided Muscles Treated: B SO's, B splenius capitis  Electrical Stimulation Performed: No Treatment Response/Outcome: less tension and increased ROM into cervical extension and flexion    [facility] Adult PT Treatment:                                                 DATE: 02/05/24 IASTM along the bil cervical paraspinals Bil first rib mobs UBE L5 x 4 min (2 min forward/ backward) Lower trap activation with elbows propped on bolster Chin tuck seated into band  Updated HEP for chin tuck  Trigger Point Dry Needling  Initial Treatment: Pt instructed on Dry Needling rational, procedures, and possible side effects. Pt instructed to expect mild to moderate muscle soreness later in the day and/or into the next day.  Pt instructed in methods to reduce muscle soreness. Pt instructed to continue prescribed HEP. Because Dry Needling was performed over or adjacent to a lung field, pt was educated on S/S of pneumothorax and to seek immediate medical attention should they occur.  Patient was educated on signs and symptoms of infection and other risk factors and advised to seek medical attention should they occur.  Patient verbalized understanding of these instructions and education.   Patient Verbal Consent Given: Yes Education Handout Provided: Yes Muscles Treated: bil cervical paraspinals, and sub-occipitals Electrical Stimulation Performed: No Treatment Response/Outcome: Twitch response, muscle lengthenin g    [facility] Adult PT Treatment:                                                DATE: 01/24/2024  Therapeutic Exercise: UBE L2 4/4 min Seated hor abd green TB 2 x 10 Seated ER green TB 15x B UT stretch 30x 2 B levator stretch 30s x2 B SCM stretch 30s x    Therapeutic Activity:  Reassessment of objective measures and subjective assessment regarding progress towards established goals and updated plan for addressing remaining deficits and rehab goals.    [facility] Adult PT Treatment:                                                DATE: 01/19/24 Therapeutic Exercise:  UBE L2 4/4 min HEP review and update Manual Therapy: Cervical ROM into L rotation from flexed position, PA NAGs at mid cervical region into flexion and L rotation  [facility] Adult PT Treatment:                                                 DATE: 01/10/24 Therapeutic Exercise: UBE L2 4/4 min Seated hor abd RTB 15x Seated ER RTB 15x Chin tucks over 1/2 roll with B rotation 10/10 Manual Therapy: B UT stretch 30x 2 B levator stretch 30s x2 B scalene stretch 30s x3 Upper cervical distraction stretch 30s x2 R first rib mobilization with R shoulder flexion                                                                                                                                PATIENT EDUCATION:  Education details: reviewed initial home exercise program; discussion of POC, prognosis and goals for skilled PT   Person educated: Patient Education method: Explanation, Demonstration, and Handouts Education comprehension: verbalized understanding, returned demonstration, and needs further education  HOME EXERCISE PROGRAM: Access Code: [email or web address] URL: [email or web address] Date: 02/05/2024 Prepared by: Joneen Fresh  Exercises - Prone W Scapular Retraction  - 2 x daily - 7 x weekly - 2 sets - 10 reps - Supine Shoulder Horizontal Abduction with Resistance  - 1 x daily - 7 x weekly - 3 sets - 10 reps - Seated Assisted Cervical Rotation with Towel  - 1 x daily - 7 x weekly - 2 sets - 10 reps - Seated Cervical Flexion Stretch with Finger Support Behind Neck  - 2 x daily - 5 x weekly - 1 sets - 30s hold - Supine Deep Neck Flexor Training - Repetitions  - 2 x daily - 5 x weekly - 2 sets - 10 reps - Cervical Retraction with Resistance  - 1 x daily - 7 x weekly - 2 sets - 10 reps  ASSESSMENT:  CLINICAL IMPRESSION: Focus of today was repeat TPDN to cervical spine.  Following needling procedure proceded with aerobic warm up and manual stretching.  Incorporated thoracic extension mobs to promote posture.     OBJECTIVE IMPAIRMENTS: decreased activity tolerance, decreased endurance, decreased mobility, decreased ROM, decreased strength, impaired UE functional use, postural  dysfunction, and pain.   ACTIVITY LIMITATIONS: carrying, lifting, reach over head, hygiene/grooming, and caring for others  PARTICIPATION LIMITATIONS: meal prep, cleaning, laundry, interpersonal relationship, driving, community activity, and occupation  PERSONAL FACTORS: Time since onset of injury/illness/exacerbation and 3+ comorbidities: Relevant PMHx includes ADHD, Anxiety, Depression, Dyspnea, Esophageal dysphagia, Esophageal reflux, RA, Tachycardia, L4-L5 fusion (2008) for spondylolistehesis are also affecting patient's functional outcome.   REHAB POTENTIAL: Fair    CLINICAL DECISION MAKING: Evolving/moderate complexity  EVALUATION COMPLEXITY: Moderate   GOALS: Goals reviewed  with patient? YES  SHORT TERM GOALS: Target date: 12/21/2023   Patient will be independent with initial home program at least 3 days/week.  Baseline: provided at eval Goal Status: MET 12/20/23: Pt reports adherence   2.  Patient will demonstrate improved postural awareness for at least 15 minutes while seated without need for cueing from PT.  Baseline: see objective measures Goal Status: MET  3.  Patient will demonstrate improved active R lateral flexion and Left rotation by at least 5 degrees.  Baseline: see objective measures Goal status: met   LONG TERM GOALS: Target date: 02/22/2024   Patient will report improved overall functional ability with NDI score of 3/50 or less.  Baseline: 8/50 01/24/24: 5/50 Goal Status: PROGRESSING   2.  Patient will demonstrate improved UQ strength to at least 4+/5 MMT BIL.  Baseline: see objective measures.  Goal status:MET  3.  Patient will be able to maintain cervical flexion endurance test for at least 25 seconds Baseline: <15 seconds 01/24/24: >30 sec  Goal status: MET  4.  Patient will report minimal to no pain with turning her head while driving, nor with work related activities Baseline: moderate pain and difficulty/reported heaviness.  01/24/24:  mild-to-moderate pain with end-range and turning quickly to look over her head Goal status: PROGRESSING    PLAN:  PT FREQUENCY: 1x/week   PT DURATION: 4 weeks   PLANNED INTERVENTIONS: 02835- PT Re-evaluation, 97750- Physical Performance Testing, 97110-Therapeutic exercises, 97530- Therapeutic activity, [id number]- Neuromuscular re-education, 97535- Self Care, 02859- Manual therapy, [id number]- Electrical stimulation (unattended), 02987- Traction (mechanical), 20560 (1-2 muscles), 20561 (3+ muscles)- Dry Needling, Patient/Family education, Taping, Joint mobilization, Spinal mobilization, Cryotherapy, and Moist heat  PLAN FOR NEXT SESSION: continue use of exercise, manual therapy and response to  TPDN to address remaining ROM deficits and pain with end-range.   Jeff Unique Sillas PT  02/14/24  1:28 PM

## 2024-02-14 NOTE — Procedures (Signed)
    History:  38 year old woman with cognitive complaints   EEG classification: Awake and drowsy  Duration: 30 minutes   Technical aspects: This EEG study was done with scalp electrodes positioned according to the 10-20 International system of electrode placement. Electrical activity was reviewed with band pass filter of 1-70Hz , sensitivity of 7 uV/mm, display speed of 55mm/sec with a 60Hz  notched filter applied as appropriate. EEG data were recorded continuously and digitally stored.   Description of the recording: The background rhythms of this recording consists of a low voltage alpha rhythm of 11 Hz that is reactive to eye opening and closure with occasional increase beta activity. Present in the anterior head region is a 15-20 Hz beta activity. Photic stimulation was performed, did not show any abnormalities. Hyperventilation was also performed, did not show any abnormalities. Drowsiness was manifested by background fragmentation. No abnormal epileptiform discharges seen during this recording. There was no focal slowing. There were no electrographic seizure identified.   Abnormality: None   Impression: This is a normal awake and drowsy EEG. No evidence of interictal epileptiform discharges.The excess beta activity is nonspecific and likely related to medication side effects. Normal EEGs, however, do not rule out epilepsy.    Stephanee Barcomb, MD [facility]

## 2024-02-14 NOTE — Telephone Encounter (Signed)
 [employer] [facility] aetna no auth req   Patient is scheduled at [facility] for 03/17/24 at 9 pm.  Gave packet to the patient of information.

## 2024-02-21 ENCOUNTER — Ambulatory Visit

## 2024-02-21 ENCOUNTER — Other Ambulatory Visit (HOSPITAL_COMMUNITY): Payer: Self-pay

## 2024-02-21 DIAGNOSIS — Z818 Family history of other mental and behavioral disorders: Secondary | ICD-10-CM

## 2024-02-21 DIAGNOSIS — G479 Sleep disorder, unspecified: Secondary | ICD-10-CM | POA: Diagnosis not present

## 2024-02-21 DIAGNOSIS — R419 Unspecified symptoms and signs involving cognitive functions and awareness: Secondary | ICD-10-CM

## 2024-02-21 DIAGNOSIS — N771 Vaginitis, vulvitis and vulvovaginitis in diseases classified elsewhere: Secondary | ICD-10-CM | POA: Diagnosis not present

## 2024-02-21 DIAGNOSIS — N760 Acute vaginitis: Secondary | ICD-10-CM | POA: Diagnosis not present

## 2024-02-21 DIAGNOSIS — Z8659 Personal history of other mental and behavioral disorders: Secondary | ICD-10-CM

## 2024-02-21 DIAGNOSIS — R300 Dysuria: Secondary | ICD-10-CM | POA: Diagnosis not present

## 2024-02-21 DIAGNOSIS — Z9189 Other specified personal risk factors, not elsewhere classified: Secondary | ICD-10-CM | POA: Diagnosis not present

## 2024-02-21 DIAGNOSIS — R413 Other amnesia: Secondary | ICD-10-CM | POA: Diagnosis not present

## 2024-02-21 DIAGNOSIS — R351 Nocturia: Secondary | ICD-10-CM | POA: Diagnosis not present

## 2024-02-21 MED ORDER — METRONIDAZOLE 500 MG PO TABS
500.0000 mg | ORAL_TABLET | Freq: Two times a day (BID) | ORAL | 0 refills | Status: DC
  Filled 2024-02-21: qty 14, 7d supply, fill #0

## 2024-02-21 MED ORDER — GADOBENATE DIMEGLUMINE 529 MG/ML IV SOLN
10.0000 mL | Freq: Once | INTRAVENOUS | Status: AC | PRN
  Administered 2024-02-21: 10 mL via INTRAVENOUS

## 2024-02-26 ENCOUNTER — Other Ambulatory Visit (HOSPITAL_COMMUNITY): Payer: Self-pay

## 2024-02-26 MED ORDER — LISDEXAMFETAMINE DIMESYLATE 50 MG PO CAPS
50.0000 mg | ORAL_CAPSULE | Freq: Every morning | ORAL | 0 refills | Status: DC
  Filled 2024-02-26: qty 30, 30d supply, fill #0

## 2024-02-27 ENCOUNTER — Other Ambulatory Visit (HOSPITAL_COMMUNITY): Payer: Self-pay

## 2024-02-27 ENCOUNTER — Other Ambulatory Visit: Payer: Self-pay

## 2024-02-29 ENCOUNTER — Other Ambulatory Visit (HOSPITAL_COMMUNITY): Payer: Self-pay

## 2024-03-01 ENCOUNTER — Ambulatory Visit

## 2024-03-04 ENCOUNTER — Encounter (INDEPENDENT_AMBULATORY_CARE_PROVIDER_SITE_OTHER): Payer: Self-pay

## 2024-03-04 ENCOUNTER — Other Ambulatory Visit: Payer: Self-pay

## 2024-03-04 ENCOUNTER — Other Ambulatory Visit (HOSPITAL_COMMUNITY): Payer: Self-pay

## 2024-03-04 ENCOUNTER — Ambulatory Visit

## 2024-03-04 DIAGNOSIS — N762 Acute vulvitis: Secondary | ICD-10-CM | POA: Diagnosis not present

## 2024-03-04 DIAGNOSIS — R5382 Chronic fatigue, unspecified: Secondary | ICD-10-CM | POA: Diagnosis not present

## 2024-03-04 DIAGNOSIS — N898 Other specified noninflammatory disorders of vagina: Secondary | ICD-10-CM | POA: Diagnosis not present

## 2024-03-04 DIAGNOSIS — N760 Acute vaginitis: Secondary | ICD-10-CM | POA: Diagnosis not present

## 2024-03-04 DIAGNOSIS — N771 Vaginitis, vulvitis and vulvovaginitis in diseases classified elsewhere: Secondary | ICD-10-CM | POA: Diagnosis not present

## 2024-03-04 DIAGNOSIS — M0609 Rheumatoid arthritis without rheumatoid factor, multiple sites: Secondary | ICD-10-CM | POA: Diagnosis not present

## 2024-03-04 MED ORDER — RINVOQ 15 MG PO TB24
ORAL_TABLET | ORAL | 2 refills | Status: DC
  Filled 2024-03-04: qty 30, 30d supply, fill #0

## 2024-03-04 MED ORDER — VALACYCLOVIR HCL 500 MG PO TABS
500.0000 mg | ORAL_TABLET | Freq: Two times a day (BID) | ORAL | 12 refills | Status: AC
  Filled 2024-03-04: qty 6, 3d supply, fill #0
  Filled 2024-05-05: qty 6, 3d supply, fill #1

## 2024-03-05 ENCOUNTER — Encounter: Payer: Self-pay | Admitting: Family Medicine

## 2024-03-05 ENCOUNTER — Ambulatory Visit (INDEPENDENT_AMBULATORY_CARE_PROVIDER_SITE_OTHER): Admitting: Family Medicine

## 2024-03-05 ENCOUNTER — Other Ambulatory Visit: Payer: Self-pay

## 2024-03-05 ENCOUNTER — Other Ambulatory Visit (HOSPITAL_COMMUNITY): Payer: Self-pay

## 2024-03-05 VITALS — BP 118/78 | HR 94 | Temp 99.0°F | Wt 135.4 lb

## 2024-03-05 DIAGNOSIS — Z6833 Body mass index (BMI) 33.0-33.9, adult: Secondary | ICD-10-CM | POA: Diagnosis not present

## 2024-03-05 DIAGNOSIS — R918 Other nonspecific abnormal finding of lung field: Secondary | ICD-10-CM

## 2024-03-05 DIAGNOSIS — E66811 Obesity, class 1: Secondary | ICD-10-CM

## 2024-03-05 NOTE — Progress Notes (Signed)
   Subjective:    Patient ID: [name], female    DOB: Jan 31, 1986, 38 y.o.   MRN: [id number]  HPI Here to follow up on lung nodules. She had a cardiac CT calcium  scan on 09-04-23 which showed a single 5-6 mm nodule in the base of both lungs. A 6 month follow up scan was recommended. She feels fine in general.    Review of Systems  Constitutional: Negative.   Respiratory: Negative.    Cardiovascular: Negative.        Objective:   Physical Exam Constitutional:      Appearance: Normal appearance.  Cardiovascular:     Rate and Rhythm: Normal rate and regular rhythm.     Pulses: Normal pulses.     Heart sounds: Normal heart sounds.  Pulmonary:     Effort: Pulmonary effort is normal.     Breath sounds: Normal breath sounds.  Neurological:     Mental Status: She is alert.           Assessment & Plan:  Lung nodules. We will order a non-contrasted chest CT to further evaluate these.  Garnette Olmsted, MD

## 2024-03-05 NOTE — Progress Notes (Signed)
 Specialty Pharmacy Refill Coordination Note  MyChart Questionnaire Submission  [name] is a 38 y.o. female contacted today regarding refills of specialty medication(s) Rinvoq .  Doses on hand: (Patient-Rptd) 8   Patient requested: (Patient-Rptd) Delivery   Delivery date: 03/07/24  Verified address: [address] [place] [address]  Medication will be filled on 03/06/24.

## 2024-03-08 ENCOUNTER — Ambulatory Visit
Admission: RE | Admit: 2024-03-08 | Discharge: 2024-03-08 | Disposition: A | Discharge status: Home / Self Care | Source: Ambulatory Visit | Attending: Family Medicine | Admitting: Family Medicine

## 2024-03-08 DIAGNOSIS — R918 Other nonspecific abnormal finding of lung field: Secondary | ICD-10-CM

## 2024-03-08 DIAGNOSIS — R911 Solitary pulmonary nodule: Secondary | ICD-10-CM | POA: Diagnosis not present

## 2024-03-11 ENCOUNTER — Ambulatory Visit: Payer: Self-pay | Admitting: Family Medicine

## 2024-03-11 ENCOUNTER — Other Ambulatory Visit (HOSPITAL_COMMUNITY): Payer: Self-pay

## 2024-03-11 ENCOUNTER — Other Ambulatory Visit: Payer: Self-pay

## 2024-03-11 DIAGNOSIS — F900 Attention-deficit hyperactivity disorder, predominantly inattentive type: Secondary | ICD-10-CM | POA: Diagnosis not present

## 2024-03-11 DIAGNOSIS — F411 Generalized anxiety disorder: Secondary | ICD-10-CM | POA: Diagnosis not present

## 2024-03-11 DIAGNOSIS — Z5181 Encounter for therapeutic drug level monitoring: Secondary | ICD-10-CM | POA: Diagnosis not present

## 2024-03-11 DIAGNOSIS — F332 Major depressive disorder, recurrent severe without psychotic features: Secondary | ICD-10-CM | POA: Diagnosis not present

## 2024-03-11 DIAGNOSIS — F4310 Post-traumatic stress disorder, unspecified: Secondary | ICD-10-CM | POA: Diagnosis not present

## 2024-03-11 MED ORDER — BUSPIRONE HCL 30 MG PO TABS
15.0000 mg | ORAL_TABLET | ORAL | 0 refills | Status: AC
  Filled 2024-03-11: qty 45, 30d supply, fill #0
  Filled 2024-05-05: qty 45, 30d supply, fill #1

## 2024-03-13 ENCOUNTER — Other Ambulatory Visit (HOSPITAL_COMMUNITY): Payer: Self-pay

## 2024-03-15 ENCOUNTER — Other Ambulatory Visit: Payer: Self-pay | Admitting: Pharmacist

## 2024-03-15 MED ORDER — RINVOQ 15 MG PO TB24
ORAL_TABLET | ORAL | 2 refills | Status: AC
  Filled 2024-03-18: qty 30, fill #0
  Filled 2024-04-01: qty 30, 30d supply, fill #0
  Filled 2024-04-26 – 2024-05-06 (×3): qty 30, 30d supply, fill #1
  Filled 2024-05-30: qty 30, 30d supply, fill #2

## 2024-03-16 ENCOUNTER — Other Ambulatory Visit (HOSPITAL_COMMUNITY): Payer: Self-pay

## 2024-03-17 ENCOUNTER — Ambulatory Visit: Admitting: Neurology

## 2024-03-17 DIAGNOSIS — R351 Nocturia: Secondary | ICD-10-CM

## 2024-03-17 DIAGNOSIS — G478 Other sleep disorders: Secondary | ICD-10-CM | POA: Diagnosis not present

## 2024-03-17 DIAGNOSIS — R413 Other amnesia: Secondary | ICD-10-CM

## 2024-03-17 DIAGNOSIS — Z818 Family history of other mental and behavioral disorders: Secondary | ICD-10-CM

## 2024-03-17 DIAGNOSIS — G4720 Circadian rhythm sleep disorder, unspecified type: Secondary | ICD-10-CM

## 2024-03-17 DIAGNOSIS — Z9189 Other specified personal risk factors, not elsewhere classified: Secondary | ICD-10-CM

## 2024-03-17 DIAGNOSIS — Z8659 Personal history of other mental and behavioral disorders: Secondary | ICD-10-CM

## 2024-03-17 DIAGNOSIS — G479 Sleep disorder, unspecified: Secondary | ICD-10-CM

## 2024-03-17 DIAGNOSIS — R419 Unspecified symptoms and signs involving cognitive functions and awareness: Secondary | ICD-10-CM

## 2024-03-18 ENCOUNTER — Other Ambulatory Visit: Payer: Self-pay

## 2024-03-19 NOTE — Procedures (Signed)
 Physician Interpretation: Please see link under Procedure Tab or under Encounters tab for physician report, technical report, as well as O2 titration and/or PAP titration tables (if applicable).

## 2024-04-01 ENCOUNTER — Other Ambulatory Visit: Payer: Self-pay

## 2024-04-01 ENCOUNTER — Other Ambulatory Visit (HOSPITAL_COMMUNITY): Payer: Self-pay

## 2024-04-02 ENCOUNTER — Encounter: Payer: Self-pay | Admitting: Pharmacist

## 2024-04-02 ENCOUNTER — Other Ambulatory Visit: Payer: Self-pay

## 2024-04-02 ENCOUNTER — Other Ambulatory Visit (HOSPITAL_COMMUNITY): Payer: Self-pay

## 2024-04-02 DIAGNOSIS — Z6826 Body mass index (BMI) 26.0-26.9, adult: Secondary | ICD-10-CM | POA: Diagnosis not present

## 2024-04-02 DIAGNOSIS — R911 Solitary pulmonary nodule: Secondary | ICD-10-CM | POA: Diagnosis not present

## 2024-04-02 DIAGNOSIS — R5382 Chronic fatigue, unspecified: Secondary | ICD-10-CM | POA: Diagnosis not present

## 2024-04-02 DIAGNOSIS — M0609 Rheumatoid arthritis without rheumatoid factor, multiple sites: Secondary | ICD-10-CM | POA: Diagnosis not present

## 2024-04-02 DIAGNOSIS — E663 Overweight: Secondary | ICD-10-CM | POA: Diagnosis not present

## 2024-04-02 DIAGNOSIS — M542 Cervicalgia: Secondary | ICD-10-CM | POA: Diagnosis not present

## 2024-04-02 MED ORDER — PREDNISONE 5 MG (48) PO TBPK
ORAL_TABLET | ORAL | 0 refills | Status: DC
  Filled 2024-04-02: qty 48, 12d supply, fill #0

## 2024-04-02 NOTE — Progress Notes (Signed)
 Specialty Pharmacy Refill Coordination Note  MyChart Questionnaire Submission  [name] is a 38 y.o. female contacted today regarding refills of specialty medication(s) Rinvoq .  Doses on hand: (Patient-Rptd) 8ish   Patient requested: (Patient-Rptd) Delivery   Delivery date: 04/05/24  Verified address: [address] [place] [address]  Medication will be filled on 04/04/24

## 2024-04-03 ENCOUNTER — Other Ambulatory Visit: Payer: Self-pay

## 2024-04-04 ENCOUNTER — Other Ambulatory Visit: Payer: Self-pay

## 2024-04-04 ENCOUNTER — Other Ambulatory Visit (HOSPITAL_COMMUNITY): Payer: Self-pay

## 2024-04-04 MED ORDER — LISDEXAMFETAMINE DIMESYLATE 50 MG PO CAPS
50.0000 mg | ORAL_CAPSULE | Freq: Every morning | ORAL | 0 refills | Status: DC
  Filled 2024-04-04 – 2024-04-13 (×2): qty 30, 30d supply, fill #0

## 2024-04-06 ENCOUNTER — Other Ambulatory Visit (HOSPITAL_COMMUNITY): Payer: Self-pay

## 2024-04-08 ENCOUNTER — Other Ambulatory Visit: Payer: Self-pay

## 2024-04-08 DIAGNOSIS — F900 Attention-deficit hyperactivity disorder, predominantly inattentive type: Secondary | ICD-10-CM | POA: Diagnosis not present

## 2024-04-08 DIAGNOSIS — F332 Major depressive disorder, recurrent severe without psychotic features: Secondary | ICD-10-CM | POA: Diagnosis not present

## 2024-04-08 DIAGNOSIS — F411 Generalized anxiety disorder: Secondary | ICD-10-CM | POA: Diagnosis not present

## 2024-04-08 DIAGNOSIS — F4310 Post-traumatic stress disorder, unspecified: Secondary | ICD-10-CM | POA: Diagnosis not present

## 2024-04-09 ENCOUNTER — Other Ambulatory Visit (HOSPITAL_COMMUNITY): Payer: Self-pay

## 2024-04-09 MED ORDER — AUVELITY 45-105 MG PO TBCR
1.0000 | EXTENDED_RELEASE_TABLET | Freq: Two times a day (BID) | ORAL | 0 refills | Status: AC
  Filled 2024-04-09: qty 180, 90d supply, fill #0

## 2024-04-10 ENCOUNTER — Other Ambulatory Visit: Payer: Self-pay

## 2024-04-11 ENCOUNTER — Other Ambulatory Visit (HOSPITAL_BASED_OUTPATIENT_CLINIC_OR_DEPARTMENT_OTHER): Payer: Self-pay

## 2024-04-12 ENCOUNTER — Other Ambulatory Visit: Payer: Self-pay

## 2024-04-13 ENCOUNTER — Other Ambulatory Visit (HOSPITAL_COMMUNITY): Payer: Self-pay

## 2024-04-15 ENCOUNTER — Other Ambulatory Visit: Payer: Self-pay

## 2024-04-16 ENCOUNTER — Other Ambulatory Visit: Payer: Self-pay

## 2024-04-16 NOTE — Progress Notes (Signed)
 Specialty Pharmacy Ongoing Clinical Assessment Note  Cailie Cy Alert is a 38 y.o. female who is being followed by the specialty pharmacy service for RxSp Rheumatoid Arthritis   Patient's specialty medication(s) reviewed today: Upadacitinib  (Rinvoq )   Missed doses in the last 4 weeks: 0   Patient/Caregiver did not have any additional questions or concerns.   Therapeutic benefit summary: Patient is achieving benefit   Adverse events/side effects summary: No adverse events/side effects   Patient's therapy is appropriate to: Continue    Goals Addressed             This Visit's Progress    Maintain optimal adherence to therapy   On track    Patient is on track. Patient will maintain adherence         Follow up: 12 months  [facility]

## 2024-04-24 ENCOUNTER — Encounter: Payer: Self-pay | Admitting: Family Medicine

## 2024-04-24 ENCOUNTER — Ambulatory Visit (INDEPENDENT_AMBULATORY_CARE_PROVIDER_SITE_OTHER): Admitting: Family Medicine

## 2024-04-24 VITALS — BP 110/76 | HR 85 | Temp 98.5°F | Wt 134.6 lb

## 2024-04-24 DIAGNOSIS — B070 Plantar wart: Secondary | ICD-10-CM | POA: Diagnosis not present

## 2024-04-24 NOTE — Progress Notes (Signed)
" ° °  Subjective:    Patient ID: [name], female    DOB: October 24, 1985, 38 y.o.   MRN: [id number]  HPI Here for a painful wart on the right 2nd toe that appeared about 2 months ago. OTC patches have not helped.    Review of Systems  Constitutional: Negative.   Respiratory: Negative.    Cardiovascular: Negative.        Objective:   Physical Exam Constitutional:      Appearance: Normal appearance.  Cardiovascular:     Rate and Rhythm: Normal rate and regular rhythm.     Pulses: Normal pulses.     Heart sounds: Normal heart sounds.  Pulmonary:     Effort: Pulmonary effort is normal.     Breath sounds: Normal breath sounds.  Skin:    Comments: There is a small wart on the lateral side of the right 2nd toe   Neurological:     Mental Status: She is alert.           Assessment & Plan:  Plantar wart. This was treated with several cycles of cryotherapy. She will cover the area with a Bandaid while this heals.  Garnette Olmsted, MD   "

## 2024-04-26 ENCOUNTER — Other Ambulatory Visit (HOSPITAL_COMMUNITY): Payer: Self-pay

## 2024-04-29 ENCOUNTER — Other Ambulatory Visit: Payer: Self-pay

## 2024-05-01 ENCOUNTER — Other Ambulatory Visit: Payer: Self-pay

## 2024-05-03 ENCOUNTER — Other Ambulatory Visit: Payer: Self-pay

## 2024-05-06 ENCOUNTER — Other Ambulatory Visit (HOSPITAL_COMMUNITY): Payer: Self-pay

## 2024-05-06 ENCOUNTER — Other Ambulatory Visit: Payer: Self-pay

## 2024-05-06 NOTE — Progress Notes (Signed)
 Specialty Pharmacy Refill Coordination Note  MyChart Questionnaire Submission  [name] is a 39 y.o. female contacted today regarding refills of specialty medication(s) Rinvoq .  Doses on hand: (Patient-Rptd) 7   Patient requested: (Patient-Rptd) Delivery   Delivery date: 05/08/24  Verified address: [address] [place] [address]  Medication will be filled on 05/07/24

## 2024-05-07 ENCOUNTER — Other Ambulatory Visit: Payer: Self-pay

## 2024-05-12 ENCOUNTER — Other Ambulatory Visit (HOSPITAL_COMMUNITY): Payer: Self-pay

## 2024-05-13 ENCOUNTER — Other Ambulatory Visit (HOSPITAL_BASED_OUTPATIENT_CLINIC_OR_DEPARTMENT_OTHER): Payer: Self-pay

## 2024-05-13 ENCOUNTER — Other Ambulatory Visit (HOSPITAL_COMMUNITY): Payer: Self-pay

## 2024-05-13 MED ORDER — LISDEXAMFETAMINE DIMESYLATE 50 MG PO CAPS
50.0000 mg | ORAL_CAPSULE | Freq: Every morning | ORAL | 0 refills | Status: AC
  Filled 2024-05-13: qty 30, 30d supply, fill #0

## 2024-05-30 ENCOUNTER — Other Ambulatory Visit: Payer: Self-pay

## 2024-05-31 ENCOUNTER — Other Ambulatory Visit: Payer: Self-pay

## 2024-05-31 ENCOUNTER — Other Ambulatory Visit (HOSPITAL_COMMUNITY): Payer: Self-pay

## 2024-05-31 MED ORDER — SULFASALAZINE 500 MG PO TABS
500.0000 mg | ORAL_TABLET | Freq: Two times a day (BID) | ORAL | 3 refills | Status: AC
  Filled 2024-05-31: qty 360, 90d supply, fill #0
